# Patient Record
Sex: Male | Born: 1955 | Race: Black or African American | Hispanic: No | Marital: Married | Smoking: Current every day smoker
Health system: Southern US, Community
[De-identification: ages and names within clinical notes are randomized; demographics above are authoritative.]

## PROBLEM LIST (undated history)

## (undated) DIAGNOSIS — E7800 Pure hypercholesterolemia, unspecified: Secondary | ICD-10-CM

## (undated) DIAGNOSIS — Q825 Congenital non-neoplastic nevus: Secondary | ICD-10-CM

## (undated) DIAGNOSIS — I10 Essential (primary) hypertension: Secondary | ICD-10-CM

## (undated) DIAGNOSIS — H538 Other visual disturbances: Secondary | ICD-10-CM

## (undated) DIAGNOSIS — I739 Peripheral vascular disease, unspecified: Secondary | ICD-10-CM

## (undated) DIAGNOSIS — N189 Chronic kidney disease, unspecified: Secondary | ICD-10-CM

## (undated) DIAGNOSIS — H905 Unspecified sensorineural hearing loss: Secondary | ICD-10-CM

## (undated) DIAGNOSIS — K5900 Constipation, unspecified: Secondary | ICD-10-CM

## (undated) DIAGNOSIS — G4700 Insomnia, unspecified: Secondary | ICD-10-CM

## (undated) DIAGNOSIS — E559 Vitamin D deficiency, unspecified: Secondary | ICD-10-CM

## (undated) DIAGNOSIS — K219 Gastro-esophageal reflux disease without esophagitis: Secondary | ICD-10-CM

## (undated) HISTORY — DX: Unspecified sensorineural hearing loss: H90.5

## (undated) HISTORY — DX: Constipation, unspecified: K59.00

## (undated) HISTORY — PX: COLONOSCOPY: SHX174

## (undated) HISTORY — DX: Other visual disturbances: H53.8

## (undated) HISTORY — DX: Insomnia, unspecified: G47.00

## (undated) HISTORY — DX: Congenital non-neoplastic nevus: Q82.5

## (undated) HISTORY — DX: Vitamin D deficiency, unspecified: E55.9

## (undated) HISTORY — DX: Pure hypercholesterolemia, unspecified: E78.00

## (undated) HISTORY — DX: Peripheral vascular disease, unspecified: I73.9

## (undated) HISTORY — DX: Gastro-esophageal reflux disease without esophagitis: K21.9

---

## 1997-11-13 ENCOUNTER — Ambulatory Visit (HOSPITAL_BASED_OUTPATIENT_CLINIC_OR_DEPARTMENT_OTHER): Admission: RE | Admit: 1997-11-13 | Discharge: 1997-11-13 | Payer: Self-pay | Admitting: Otolaryngology

## 1999-08-26 ENCOUNTER — Emergency Department (HOSPITAL_COMMUNITY): Admission: EM | Admit: 1999-08-26 | Discharge: 1999-08-26 | Payer: Self-pay | Admitting: Emergency Medicine

## 1999-09-13 ENCOUNTER — Encounter: Admission: RE | Admit: 1999-09-13 | Discharge: 1999-12-12 | Payer: Self-pay | Admitting: Family Medicine

## 1999-09-15 ENCOUNTER — Encounter: Admission: RE | Admit: 1999-09-15 | Discharge: 1999-09-15 | Payer: Self-pay | Admitting: Family Medicine

## 1999-09-15 ENCOUNTER — Encounter: Payer: Self-pay | Admitting: Family Medicine

## 2000-05-07 ENCOUNTER — Emergency Department (HOSPITAL_COMMUNITY): Admission: EM | Admit: 2000-05-07 | Discharge: 2000-05-07 | Payer: Self-pay | Admitting: Emergency Medicine

## 2001-01-14 ENCOUNTER — Emergency Department (HOSPITAL_COMMUNITY): Admission: EM | Admit: 2001-01-14 | Discharge: 2001-01-14 | Payer: Self-pay | Admitting: Emergency Medicine

## 2003-01-30 ENCOUNTER — Encounter: Admission: RE | Admit: 2003-01-30 | Discharge: 2003-04-30 | Payer: Self-pay | Admitting: Family Medicine

## 2004-12-01 ENCOUNTER — Inpatient Hospital Stay (HOSPITAL_COMMUNITY): Admission: EM | Admit: 2004-12-01 | Discharge: 2004-12-06 | Payer: Self-pay | Admitting: Emergency Medicine

## 2006-01-05 ENCOUNTER — Emergency Department (HOSPITAL_COMMUNITY): Admission: EM | Admit: 2006-01-05 | Discharge: 2006-01-06 | Payer: Self-pay | Admitting: Emergency Medicine

## 2006-01-12 ENCOUNTER — Emergency Department (HOSPITAL_COMMUNITY): Admission: EM | Admit: 2006-01-12 | Discharge: 2006-01-12 | Payer: Self-pay | Admitting: Family Medicine

## 2008-01-27 ENCOUNTER — Inpatient Hospital Stay (HOSPITAL_COMMUNITY): Admission: EM | Admit: 2008-01-27 | Discharge: 2008-01-28 | Payer: Self-pay | Admitting: Emergency Medicine

## 2008-05-01 HISTORY — PX: PERIPHERAL ARTERIAL STENT GRAFT: SHX2220

## 2008-08-17 ENCOUNTER — Ambulatory Visit (HOSPITAL_COMMUNITY): Admission: RE | Admit: 2008-08-17 | Discharge: 2008-08-17 | Payer: Self-pay | Admitting: Cardiovascular Disease

## 2008-10-05 ENCOUNTER — Ambulatory Visit: Payer: Self-pay | Admitting: Surgery

## 2008-10-15 ENCOUNTER — Inpatient Hospital Stay (HOSPITAL_COMMUNITY): Admission: RE | Admit: 2008-10-15 | Discharge: 2008-10-19 | Payer: Self-pay | Admitting: Surgery

## 2008-10-15 ENCOUNTER — Ambulatory Visit: Payer: Self-pay | Admitting: Surgery

## 2008-11-09 ENCOUNTER — Ambulatory Visit: Payer: Self-pay | Admitting: Surgery

## 2009-06-15 ENCOUNTER — Encounter (INDEPENDENT_AMBULATORY_CARE_PROVIDER_SITE_OTHER): Payer: Self-pay | Admitting: Family Medicine

## 2009-06-15 ENCOUNTER — Ambulatory Visit: Payer: Self-pay | Admitting: Family Medicine

## 2009-06-15 LAB — CONVERTED CEMR LAB
ALT: 14 units/L (ref 0–53)
CO2: 23 meq/L (ref 19–32)
Calcium: 9.4 mg/dL (ref 8.4–10.5)
Chloride: 98 meq/L (ref 96–112)
Cholesterol: 262 mg/dL — ABNORMAL HIGH (ref 0–200)
Lymphs Abs: 2.6 10*3/uL (ref 0.7–4.0)
MCV: 78 fL (ref 78.0–100.0)
Monocytes Relative: 6 % (ref 3–12)
Neutro Abs: 9 10*3/uL — ABNORMAL HIGH (ref 1.7–7.7)
Neutrophils Relative %: 73 % (ref 43–77)
Potassium: 4.8 meq/L (ref 3.5–5.3)
RBC: 6.83 M/uL — ABNORMAL HIGH (ref 4.22–5.81)
Sodium: 135 meq/L (ref 135–145)
Total Protein: 6.8 g/dL (ref 6.0–8.3)
VLDL: 26 mg/dL (ref 0–40)
WBC: 12.4 10*3/uL — ABNORMAL HIGH (ref 4.0–10.5)

## 2009-06-16 ENCOUNTER — Ambulatory Visit: Payer: Self-pay | Admitting: Internal Medicine

## 2009-07-14 ENCOUNTER — Ambulatory Visit: Payer: Self-pay | Admitting: Internal Medicine

## 2009-11-11 ENCOUNTER — Ambulatory Visit: Payer: Self-pay | Admitting: Internal Medicine

## 2009-11-11 ENCOUNTER — Encounter (INDEPENDENT_AMBULATORY_CARE_PROVIDER_SITE_OTHER): Payer: Self-pay | Admitting: Family Medicine

## 2009-11-11 LAB — CONVERTED CEMR LAB
AST: 15 units/L (ref 0–37)
Albumin: 4 g/dL (ref 3.5–5.2)
Alkaline Phosphatase: 117 units/L (ref 39–117)
BUN: 17 mg/dL (ref 6–23)
HDL: 48 mg/dL (ref >39)
LDL Cholesterol: 178 mg/dL — ABNORMAL HIGH (ref 0–99)
PSA: 1.43 ng/mL (ref 0.10–4.00)
Potassium: 4.4 meq/L (ref 3.5–5.3)
Sodium: 142 meq/L (ref 135–145)
VLDL: 15 mg/dL (ref 0–40)

## 2009-11-17 ENCOUNTER — Ambulatory Visit: Payer: Self-pay | Admitting: Internal Medicine

## 2010-01-18 ENCOUNTER — Encounter (INDEPENDENT_AMBULATORY_CARE_PROVIDER_SITE_OTHER): Payer: Self-pay | Admitting: Family Medicine

## 2010-01-18 LAB — CONVERTED CEMR LAB
ALT: 14 units/L (ref 0–53)
Hgb A1c MFr Bld: 13.3 % — ABNORMAL HIGH (ref <5.7)
LDL Cholesterol: 159 mg/dL — ABNORMAL HIGH (ref 0–99)
Triglycerides: 69 mg/dL (ref <150)

## 2010-08-08 LAB — BLOOD GAS, ARTERIAL
Bicarbonate: 21.2 mEq/L (ref 20.0–24.0)
Bicarbonate: 21.9 mEq/L (ref 20.0–24.0)
Drawn by: 206361
FIO2: 0.21 %
O2 Saturation: 98.2 %
Patient temperature: 97.5
Patient temperature: 98.6
TCO2: 22.6 mmol/L (ref 0–100)
pH, Arterial: 7.384 (ref 7.350–7.450)
pO2, Arterial: 115 mmHg — ABNORMAL HIGH (ref 80.0–100.0)

## 2010-08-08 LAB — DIFFERENTIAL
Basophils Absolute: 0 10*3/uL (ref 0.0–0.1)
Basophils Relative: 0 % (ref 0–1)
Eosinophils Relative: 0 % (ref 0–5)
Monocytes Absolute: 1.3 10*3/uL — ABNORMAL HIGH (ref 0.1–1.0)

## 2010-08-08 LAB — COMPREHENSIVE METABOLIC PANEL
ALT: 15 U/L (ref 0–53)
AST: 23 U/L (ref 0–37)
Albumin: 2.9 g/dL — ABNORMAL LOW (ref 3.5–5.2)
Albumin: 3.7 g/dL (ref 3.5–5.2)
Alkaline Phosphatase: 63 U/L (ref 39–117)
Alkaline Phosphatase: 89 U/L (ref 39–117)
BUN: 9 mg/dL (ref 6–23)
CO2: 23 mEq/L (ref 19–32)
Calcium: 8.9 mg/dL (ref 8.4–10.5)
Chloride: 109 mEq/L (ref 96–112)
GFR calc Af Amer: >60 mL/min (ref >60)
GFR calc Af Amer: >60 mL/min (ref >60)
GFR calc non Af Amer: >60 mL/min (ref >60)
Potassium: 3.7 mEq/L (ref 3.5–5.1)
Potassium: 4 mEq/L (ref 3.5–5.1)
Sodium: 137 mEq/L (ref 135–145)
Total Bilirubin: 1.2 mg/dL (ref 0.3–1.2)
Total Protein: 6.6 g/dL (ref 6.0–8.3)

## 2010-08-08 LAB — GLUCOSE, CAPILLARY
Glucose-Capillary: 108 mg/dL — ABNORMAL HIGH (ref 70–99)
Glucose-Capillary: 114 mg/dL — ABNORMAL HIGH (ref 70–99)
Glucose-Capillary: 143 mg/dL — ABNORMAL HIGH (ref 70–99)
Glucose-Capillary: 145 mg/dL — ABNORMAL HIGH (ref 70–99)
Glucose-Capillary: 167 mg/dL — ABNORMAL HIGH (ref 70–99)
Glucose-Capillary: 177 mg/dL — ABNORMAL HIGH (ref 70–99)
Glucose-Capillary: 220 mg/dL — ABNORMAL HIGH (ref 70–99)
Glucose-Capillary: 277 mg/dL — ABNORMAL HIGH (ref 70–99)

## 2010-08-08 LAB — POCT I-STAT 7, (LYTES, BLD GAS, ICA,H+H)
Acid-base deficit: 2 mmol/L (ref 0.0–2.0)
Calcium, Ion: 1.15 mmol/L (ref 1.12–1.32)
Calcium, Ion: 1.15 mmol/L (ref 1.12–1.32)
HCT: 45 % (ref 39.0–52.0)
HCT: 47 % (ref 39.0–52.0)
Hemoglobin: 16 g/dL (ref 13.0–17.0)
O2 Saturation: 100 %
O2 Saturation: 100 %
Patient temperature: 36
Potassium: 3.2 mEq/L — ABNORMAL LOW (ref 3.5–5.1)
Potassium: 3.2 mEq/L — ABNORMAL LOW (ref 3.5–5.1)
Sodium: 140 mEq/L (ref 135–145)
pCO2 arterial: 37.4 mmHg (ref 35.0–45.0)
pH, Arterial: 7.41 (ref 7.350–7.450)
pO2, Arterial: 355 mmHg — ABNORMAL HIGH (ref 80.0–100.0)
pO2, Arterial: 401 mmHg — ABNORMAL HIGH (ref 80.0–100.0)

## 2010-08-08 LAB — CBC
HCT: 39.8 % (ref 39.0–52.0)
HCT: 47.1 % (ref 39.0–52.0)
HCT: 47.2 % (ref 39.0–52.0)
Hemoglobin: 12.1 g/dL — ABNORMAL LOW (ref 13.0–17.0)
Hemoglobin: 13 g/dL (ref 13.0–17.0)
Hemoglobin: 15.4 g/dL (ref 13.0–17.0)
MCHC: 32.6 g/dL (ref 30.0–36.0)
MCHC: 33.4 g/dL (ref 30.0–36.0)
MCV: 80 fL (ref 78.0–100.0)
MCV: 80.4 fL (ref 78.0–100.0)
Platelets: 176 10*3/uL (ref 150–400)
Platelets: 218 10*3/uL (ref 150–400)
RBC: 4.5 MIL/uL (ref 4.22–5.81)
RBC: 5.87 MIL/uL — ABNORMAL HIGH (ref 4.22–5.81)
RDW: 12.7 % (ref 11.5–15.5)
RDW: 13 % (ref 11.5–15.5)
RDW: 13.3 % (ref 11.5–15.5)
WBC: 12.4 10*3/uL — ABNORMAL HIGH (ref 4.0–10.5)
WBC: 16.4 10*3/uL — ABNORMAL HIGH (ref 4.0–10.5)
WBC: 24 10*3/uL — ABNORMAL HIGH (ref 4.0–10.5)

## 2010-08-08 LAB — POCT I-STAT 3, ART BLOOD GAS (G3+)
Acid-base deficit: 4 mmol/L — ABNORMAL HIGH (ref 0.0–2.0)
Bicarbonate: 21.2 mEq/L (ref 20.0–24.0)
O2 Saturation: 98 %
Patient temperature: 37.2
TCO2: 22 mmol/L (ref 0–100)

## 2010-08-08 LAB — BASIC METABOLIC PANEL
CO2: 24 mEq/L (ref 19–32)
Calcium: 7.6 mg/dL — ABNORMAL LOW (ref 8.4–10.5)
Calcium: 8 mg/dL — ABNORMAL LOW (ref 8.4–10.5)
Chloride: 109 mEq/L (ref 96–112)
GFR calc Af Amer: >60 mL/min (ref >60)
GFR calc non Af Amer: >60 mL/min (ref >60)
GFR calc non Af Amer: >60 mL/min (ref >60)
Glucose, Bld: 141 mg/dL — ABNORMAL HIGH (ref 70–99)
Glucose, Bld: 150 mg/dL — ABNORMAL HIGH (ref 70–99)
Glucose, Bld: 285 mg/dL — ABNORMAL HIGH (ref 70–99)
Potassium: 3.8 mEq/L (ref 3.5–5.1)
Potassium: 3.9 mEq/L (ref 3.5–5.1)
Sodium: 132 mEq/L — ABNORMAL LOW (ref 135–145)
Sodium: 134 mEq/L — ABNORMAL LOW (ref 135–145)
Sodium: 138 mEq/L (ref 135–145)

## 2010-08-08 LAB — TYPE AND SCREEN
ABO/RH(D): A POS
Antibody Screen: NEGATIVE

## 2010-08-08 LAB — POCT I-STAT 4, (NA,K, GLUC, HGB,HCT)
Glucose, Bld: 86 mg/dL (ref 70–99)
HCT: 44 % (ref 39.0–52.0)
Hemoglobin: 15 g/dL (ref 13.0–17.0)
Potassium: 3 mEq/L — ABNORMAL LOW (ref 3.5–5.1)
Sodium: 142 mEq/L (ref 135–145)

## 2010-08-08 LAB — PROTIME-INR: INR: 0.9 (ref 0.00–1.49)

## 2010-08-08 LAB — URINALYSIS, ROUTINE W REFLEX MICROSCOPIC
Glucose, UA: NEGATIVE mg/dL
Ketones, ur: NEGATIVE mg/dL
Protein, ur: NEGATIVE mg/dL

## 2010-08-08 LAB — MAGNESIUM: Magnesium: 1.4 mg/dL — ABNORMAL LOW (ref 1.5–2.5)

## 2010-08-08 LAB — AMYLASE: Amylase: 47 U/L (ref 27–131)

## 2010-08-08 LAB — URINE MICROSCOPIC-ADD ON

## 2010-08-08 LAB — APTT: aPTT: 26 seconds (ref 24–37)

## 2010-09-13 NOTE — Op Note (Signed)
Gary [name], Gary [name]                ACCOUNT NO.:  [id number]   MEDICAL RECORD NO.:  [id number]          PATIENT TYPE:  INP   LOCATION:  2308                         FACILITY:  [facility]   PHYSICIAN:  Theotis Burrow IV, MDDATE OF BIRTH:  October 07, 1955   DATE OF PROCEDURE:  10/15/2008  DATE OF DISCHARGE:                               OPERATIVE REPORT   PREOPERATIVE DIAGNOSIS:  Severe bilateral claudication.   POSTOPERATIVE DIAGNOSIS:  Severe bilateral claudication.   PROCEDURE PERFORMED:  Aortobifemoral bypass graft using 14 x 7  bifurcated Dacron graft.   SURGEON:  1. Leia Alf, MD   ASSISTANT:  Ernesto Rutherford, PA   ANESTHESIA:  General.   BLOOD LOSS:  250 cc.   DRAINS:  None.   COMPLICATIONS:  None.   INDICATIONS:  Gary [name] is a 55 year old gentleman who has had  claudication over the past 2 years, which has gotten worse recently to  where he has very minimal ability to ambulate.  He has been seen and  cleared for surgery by Dr. Quay Burow, his cardiologist.  A CT scan  reveals distal aortic occlusion, comes in today for an aortobifemoral  bypass graft.   PROCEDURE IN DETAIL:  The patient was identified in the holding area,  taken to room and he was placed supine on the table.  General  endotracheal anesthesia was administered.  The patient was prepped and  draped in standard sterile fashion.  A time-out was called.  Antibiotics  were given.  The inguinal ligament was identified by bony landmarks as  there was no palpable pulse.  A longitudinal incision was made below the  inguinal ligament.  Cautery was used to divide the subcutaneous tissue.  The femoral sheath was identified and opened sharply bilaterally.  The  left femoral artery was identified.  There were numerous collateral  branches coming from both femoral, majority of which were preserved.  On  the left side, there was a dominant anterior branch with multiple  secondary branch, which was preserved.   Once the femoral arteries were  identified bilaterally, they were mobilized down to the bifurcation of  the profunda and superficial femoral artery.  There was a large  posterior branch on the right.  Once adequate exposure of the femoral  artery was obtained, I mobilized it up under the inguinal ligament,  large crossing vein was divided on both sides with 2-0 silk tie.  At  this point, wet Ray-Tecs were placed in the groin and attention was  turned towards the abdomen.  Midline incision was made with #10 blade.  The subcutaneous tissue was divided with cautery down to the abdominal  wall fascia.  The fascia was opened in the midline and the abdominal  cavity was entered.  Gross inspection of the abdominal cavity in its  major organs was without abnormality.  The transverse colon was then  lifted cephalad with the omentum.  Ligament of Treitz was taken down  sharply.  Small bowel was mobilized to the patient's right side of the  body.  Omni-Tract retractor was used to aid with exposure.  The  retroperitoneum was entered sharply.  The inferior mesenteric was  identified and divided between 2-0 silk ties.  The patient had a large  approximately 4-mm inferior mesenteric artery and mobilized the aorta up  to the renal vein.  It was soft in this area, as well as without heavy  calcification down to the inferior mesenteric artery.  I then identified  each common iliac artery and created a tunnel into each groin.  The  tunnel was made bluntly directly anterior to the iliac arteries making  sure I was posterior to the ureter.  An umbilical tape was passed.  At  this point in time, the patient was given systemic heparinization.  After the heparin circulated, the aorta was occluded with two Zanger  clamps below the renal arteries and above the inferior mesenteric  artery.  Due to the size of the inferior mesenteric artery, I elected to  perform an end-to-side anastomosis.  An aortotomy was made  with #11  blade, which was extended with Potts scissors.  There is minimal  atheroma at the level of the anastomosis.  A 14 x 7 graft was left.  The  end was beveled to fit the size of the arteriotomy.  A running  anastomosis was created with a 6-0 Prolene.  This was an end-to-side  anastomosis.  Once this was completed, the aorta was flushed in  antegrade and retrograde fashion.  The anastomosis was hemostatic.  The  retractors were then relaxed and the limbs of the graft were brought  through the previously created tunnels.  At this point, the right  femoral artery anastomosis was performed first.  This anastomosis was to  be common femoral artery.  There was a posterior profunda branch in the  midportion of the anastomosis and the distal part was in the distal  common femoral artery.  An 11-blade was used to make an arteriotomy,  which was extended with Potts scissors.  The graft was pulled and  transected and then beveled.  An end-to-side anastomosis was created  with a running 5-0.  Prior to completion of the anastomosis, the femoral  artery was flushed in antegrade and retrograde fashion.  The right limb  of the graft was also flushed and the anastomosis was then secured.  The  right leg was then restored.  Next, the left groin anastomosis was  performed.  The femoral artery was occluded proximal to the vascular  clamps.  A #11 blade was used to make an arteriotomy that was extended  with Potts scissors.  This was in the distal common femoral artery.  The  graft was pulled to length and transected.  The end beveled to fit the  size of the arteriotomy and a running anastomosis was created in an end-  to-side fashion using a 5-0 Prolene.  Prior to completion of the  anastomosis, the femoral artery was flushed in antegrade and retrograde  fashion.  The graft was then flushed.  The anastomosis was then secured.  Clamps were released first on the common femoral artery and then on the   graft to flush any debris into the pelvis and then the left leg blood  flow to the left leg was restored.  Handheld Doppler was used to  evaluate signals in the profunda and superficial femoral artery.  Both  had multiphasic signals, which dampened with graft occlusion.  At this  point in time, I was satisfied with the repair.  The patient was given  50  mg of protamine.  Attention was turned again to the abdomen.  The  proximal anastamosis was hemostatic.  The retroperitoneum was irrigated  with saline as was the abdomen.  The retroperitoneum was reapproximated  with 2-0 vicryl.  Teh abdominal contents were place back into their  anatomical position.  The bowel was inspected and found to be healthy.  The fascia was re-approximated with #1 PDS.  The subcutaneous tissue was  closed with 3-0 vicryl, and the skin was cloed with 4-0 vicryl.  The  groins were then irrigated.  They were hemostatic.  The were closed in  multiple layers of 2-0 and 3-0 cicryl.  The skin was closed with 4-0  vicryl.  Sterile dressings were applied.  The patient was successfully  extubated and taken to the PACU in stable condition.    DICTATION ENDED AT THIS POINT.      Eldridge Abrahams, MD  Electronically Signed     VWB/MEDQ  D:  10/15/2008  T:  10/16/2008  Job:  [id number]

## 2010-09-13 NOTE — Discharge Summary (Signed)
Gary [name], Gary [name]                ACCOUNT NO.:  [id number]   MEDICAL RECORD NO.:  [id number]          PATIENT TYPE:  INP   LOCATION:  2029                         FACILITY:  [facility]   PHYSICIAN:  Theotis Burrow IV, MDDATE OF BIRTH:  [birth year]   DATE OF ADMISSION:  10/15/2008  DATE OF DISCHARGE:  10/19/2008                               DISCHARGE SUMMARY   FINAL DISCHARGE DIAGNOSES:  1. Peripheral vascular disease with a recent severe bilateral      claudication of his lower extremities.  2. Dyslipidemia.  3. Hypertension.  4. Diabetes mellitus.  5. Tobacco abuse.   PROCEDURES PERFORMED:  Aortobifemoral bypass graft using 14 x 7  bifurcated Dacron graft by Dr. Trula Slade, October 15, 2008.   COMPLICATIONS:  None.   CONDITION ON DISCHARGE:  Stable, improving.   DISCHARGE MEDICATIONS:  He is instructed to resume all previous  medications consisting of,  1. Actos 45 mg p.o. daily.  2. Metformin 1000 mg p.o. b.i.d.  3. Simvastatin 40 mg p.o. nightly.  4. Glimepiride 4 mg p.o. daily.  5. Ramipril 2.5 mg p.o. daily.  6. Lantus insulin 20 units p.r.n. subcu.  7. Aspirin 81 mg p.o. daily.   He is given a prescription for Percocet 5/325 one p.o. q.4 h. p.r.n.  pain, total #40 was given.   DISPOSITION:  He is given careful instructions regarding the care of his  wounds and his activity level.  He is cautioned on lifting heavy objects  for the next 6 weeks.  He is informed that he should not drive for the  next 2 weeks.  He is given a return appointment with Dr. Trula Slade in 2  weeks with ABIs.   BRIEF IDENTIFYING STATEMENT:  For complete details, please refer the  typed history and physical.  Briefly, this very pleasant 54 year old  gentleman was evaluated by Dr. Trula Slade for bilateral claudication.  He  was found to have significant iliac disease.  Dr. Trula Slade recommended  aortobifemoral bypass grafting.  He was informed of the risks and  benefits of the procedure and after careful  consideration, he elected to  proceed with the surgery.   HOSPITAL COURSE:  Preoperative workup was completed as an outpatient.  He was brought in through same-day surgery on October 15, 2008 and  underwent the aforementioned revascularization procedure.  For complete  details, please refer the typed operative report.  The procedure was  without complications.  He was returned to the intensive care unit in  critical, but hemodynamically stable condition.  He was extubated.  He  was able to be transferred to a  bed on the surgical convalescent floor on the second postoperative day.  Following his transfer, his diet and activity level were increased as  tolerated.  On October 19, 2008, he was evaluated.  He was found to be in  stable condition.  He was desirous of discharge and was discharged home.      Chad Cordial, PA      V. Leia Alf, MD  Electronically Signed    KEL/MEDQ  D:  10/19/2008  T:  10/19/2008  Job:  [id number]   cc:   Eldridge Abrahams, MD

## 2010-09-13 NOTE — Assessment & Plan Note (Signed)
OFFICE VISIT   Gary [name], Gary [name]  DOB:  Aug 23, 1955                                       11/09/2008  [id number]   REASON FOR VISIT:  Postoperative follow-up status post aortobifemoral  bypass graft.   HISTORY:  This is a 55 year old gentleman that I saw at the request of  Dr. Quay Burow for evaluation of bilateral claudication.  The  patient was having difficulty ambulating more than 20-30 feet secondary  to cramping in both legs.  His right leg was more severe than the left.  He had been having symptoms for 2 years which had gotten progressively  worse.  He had a CT scan which revealed an infraaortic occlusion.  Ankle  brachial indices were 0.56 bilaterally.  The patient underwent  aortobifemoral bypass graft using a 14 x 7 bifurcated Dacron graft.  This was performed on October 15, 2008.  The patient's postoperative course  was uncomplicated.  He was discharged home on postoperative day #4. He  comes back in today for follow-up.  He is doing very well at this time.  He is not having any issues with claudication.  He says he walks half a  mile to a mile and does not have any difficulties.  He is very happy  with the improvement in his lifestyle.   On examination his midline incision is well healed.  Both groin  incisions are well-healed.  He has palpable pedal pulses.   ASSESSMENT:  Status post aortobifemoral bypass graft, doing very well.   PLAN:  The patient will need to undergo routine bypass graft  surveillance and this will be performed by Dr. Quay Burow to have  his vascular disease followed.  He will need to undergo serial  surveillance ultrasounds.  If there are any complications,  I would like  to be notified.  Again, Dr. Gwenlyn Found will coordinate his postoperative  vascular care.   Gary Abrahams, MD  Electronically Signed   VWB/MEDQ  D:  11/09/2008  T:  11/10/2008  Job:  [id number]   cc:   Quay Burow, M.D.  Nolene Ebbs,  M.D.

## 2010-09-13 NOTE — Procedures (Signed)
CAROTID DUPLEX EXAM   INDICATION:  Preop evaluation.   HISTORY:  Diabetes:  Yes.  Cardiac:  No.  Hypertension:  Yes.  Smoking:  Yes.  Previous Surgery:  No carotid surgeries.  CV History:  Asymptomatic.  Amaurosis Fugax No, Paresthesias No, Hemiparesis No.                                       RIGHT             LEFT  Brachial systolic pressure:         130               122  Brachial Doppler waveforms:         Normal            Normal  Vertebral direction of flow:        Antegrade         Antegrade  DUPLEX VELOCITIES (cm/sec)  CCA peak systolic                   105               [id number]  ECA peak systolic                   82                66  ICA peak systolic                   67                88  ICA end diastolic                   27                36  PLAQUE MORPHOLOGY:                                    Heterogenous  PLAQUE AMOUNT:                      None              Mild  PLAQUE LOCATION:                                      ICA   IMPRESSION:  1. No evidence of stenosis noted in the right internal carotid artery.  2. 1 to 39% stenosis of the left internal carotid artery.    ___________________________________________  V. Leia Alf, MD   CH/MEDQ  D:  10/05/2008  T:  10/05/2008  Job:  [id number]

## 2010-09-13 NOTE — Assessment & Plan Note (Signed)
OFFICE VISIT   [name], [name]  DOB:  1956/02/11                                       10/05/2008  [id number]   REASON FOR VISIT:  Bilateral claudication.   REFERRING PHYSICIAN:  Quay Burow, M.D.   HISTORY:  This is a 55 year old gentleman who I am seeing at the request  of Dr. Quay Burow for evaluation of bilateral claudication.  The  patient states that he can only ambulate approximately 20-30 feet at  which time he gets cramping in both legs, the right leg is more severe  than the left.  He states that this has been present for approximately 2  years and has been getting progressively worse over time.  He denies  symptoms of rest pain.  He denies having any nonhealing wounds or  ulcers.  His symptoms are improved with rest and are aggravated with  ambulation.  He also endorses symptoms of impotence.   The patient endorses tobacco abuse for approximately 40 pack years.  He  does have insulin dependent diabetes, hypertension and  hypercholesterolemia.  He has never had a heart attack.   REVIEW OF SYSTEMS:  GENERAL:  Negative for fevers, chills, weight gain,  weight loss.  CARDIAC:  Negative.  PULMONARY:  Negative.  GI:  Negative.  GU:  Negative.  VASCULAR:  Positive for pain in legs with walking and when lying flat.  NEURO:  Negative.  ORTHO:  Negative.  PSYCHIATRIC:  Negative.  ENT:  Negative.  HEME:  Negative.   PAST MEDICAL HISTORY:  Diabetes, hypertension, hypercholesterolemia,  peripheral vascular disease.   FAMILY HISTORY:  Is significant for diabetes in his mother.   SOCIAL HISTORY:  He is married.  Works as a Administrator.  Currently  smokes one pack a day.  Does not drink alcohol.   MEDICATIONS:  Include metformin, simvastatin, ramipril, Actos,  glimepiride, aspirin.   ALLERGIES:  None.   PHYSICAL EXAMINATION:  Vital signs:  Blood pressure 130/80, pulse is 88.  General:  He is well-appearing, in no distress.   HEENT:  Normocephalic,  atraumatic.  Pupils equal.  Sclerae anicteric.  Neck:  Supple.  No JVD.  No carotid bruits.  Cardiovascular:  Regular rate and rhythm.  No  murmurs.  Pulmonary:  Lungs clear bilaterally.  Abdomen:  Soft,  nontender.  No hepatosplenomegaly.  Extremities:  Are warm and well-  perfused.  He has faint femoral pulses.  No palpable pedal pulses.  Neurological:  Cranial nerves II-XII are grossly intact.  Skin:  Is  without rash.  Psychiatric:  He is alert and oriented x3.   DIAGNOSTIC STUDIES:  The patient comes with records from [place].  Cardiac echo shows normal ejection fraction.  Myoview shows normal  perfusion.  Ankle brachial indices are 0.56 on the right and 0.56 on the  left.  CT angiogram:  The patient has infrarenal aortic occlusion with  reconstitution of bilateral external iliac arteries.   ASSESSMENT:  Bilateral claudication.   PLAN:  I discussed with the patient and his wife their options and  indications for surgical intervention.  He is eager to get this taken  care as he is very uncomfortable with this degree of symptoms.  We have  decided to proceed with an aortobi-iliac versus an aortobifemoral bypass  graft.  We discussed the possible risks which include  but are not  limited to risk of heart attack, risk of pneumonia, risk of graft  infection, risk of early graft loss.  The patient understands this and  is eager to proceed.  I will attempt a bi-iliac bypass and avoid a groin  incision.  His operation has been scheduled for 10/15/2008 which is a  Thursday.  I am going to get a baseline carotid duplex today as he has  not had this done.   Eldridge Abrahams, MD  Electronically Signed   VWB/MEDQ  D:  10/05/2008  T:  10/07/2008  Job:  [id number]   cc:   Quay Burow, M.D.  Nolene Ebbs, M.D.

## 2010-09-16 NOTE — Discharge Summary (Signed)
NAMECLEBURN, GREIWE                ACCOUNT NO.:  [id number]   MEDICAL RECORD NO.:  [id number]          PATIENT TYPE:  INP   LOCATION:  5727                         FACILITY:  [facility]   PHYSICIAN:  Corinna L. Conley Canal, MDDATE OF BIRTH:  21-Jul-1955   DATE OF ADMISSION:  11/30/2004  DATE OF DISCHARGE:  12/06/2004                                 DISCHARGE SUMMARY   DIAGNOSES:  1.  Right preseptal cellulitis with right facial cellulitis.  2.  Uncontrolled diabetes.  3.  Multiple apical abscesses.  4.  Hyperlipidemia.  5.  Tobacco abuse, counseled against.  6.  Medical noncompliance.  7.  Tinea pedis.   DISCHARGE MEDICATIONS:  1.  Augmentin 875 milligrams p.o. q.d. for nine more days.  2.  Metformin 1000 milligrams p.o. b.i.d.  3.  Amaryl 8 milligrams p.o. q.d.  4.  Econazole Cream to the feet and toes twice daily.  5.  Zocor 20 milligrams p.o. q.h.s.  6.  Amaryl 8 milligrams p.o. q.d.  7.  Actos 45 milligrams p.o. q.d.  8.  Lantus 30 units subcutaneously q.h.s.  9.  Tylenol as needed.   DISCHARGE INSTRUCTIONS:  He is to apply warm compresses to the right eye  until the swelling has resolved. Diet should be diabetic low salt. He may  return to work in one week. Follow up with [facility] in one week. Follow  up with dental clinic and he is to monitor his blood glucoses at least twice  a day. He is encouraged to quit smoking and attend outpatient diabetes  education classes.   CONDITION:  Stable.   CONSULTATIONS:  Dr. Deloria Lair.   PROCEDURES:  None.   PERTINENT LABORATORY DATA:  Blood cultures negative. UA showed 40 ketones,  greater than 1000 glucose, specific gravity 1.034, negative nitrites,  negative leukocyte esterase, negative protein. LDL was 141, HDL is 40,  triglycerides 119, total cholesterol 205, hemoglobin A1c is 17.4. CMET on  admission was significant for a glucose of 477 otherwise unremarkable. CBC  on admission was significant for white blood cell count of  16,000 otherwise  unremarkable. At discharge, his white blood cell count was 10,000. Special  studies in radiology:  Maxillofacial CT showed a right orbital cellulitis  extending into the right neck, no intraconal extension, numerous periapical  abscesses in the maxilla and mandible and severe dental caries.   HISTORY AND HOSPITAL COURSE:  Mr. Cottman is a 55 year old unassigned black  male with a history of diabetes who has not seen his primary care physician  nor taken any medications for several years. He has been on insulin in the  past. He presented to the emergency room with right eye swelling. He also  noted polyuria, polydipsia. He had some purulent drainage from his right eye  and pain. He had a temperature of 100.7 in the emergency room. He admits to  smoking. He is truckdriver. His pulse was 105, his temperature was 100.7,  otherwise vital signs were within normal limits. He had severe swelling of  the right eye. It was closed, red and very tender. There were full  extraocular movements and visual acuity was intact. Tympanic membranes were  clear. Nares patent. The pharynx was without erythema or exudate. He was  found to have severe hyperglycemia and was admitted to the hospitalist  service and started on Unasyn and vancomycin. I was concerned about  underlying abscess initially but CAT scan did not show this. It did show,  however, periapical abscesses and the patient reports that he has had tooth  pain for quite some time.  I consulted Dr. Zadie Rhine who felt that this was all  preseptal cellulitis and possibly dacryocystitis. Warm compresses were  applied to his eye. His infection was quite slow to improve but at the time  of discharge had improved tremendously. He still had a bit of swelling but  drainage, tenderness, redness, fevers, leukocytosis were all improved. 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 I have  started him on maximal doses of Amaryl,  metformin, Actos; however, he has still had quite elevated blood glucoses  despite oral hypoglycemics and he has been getting Lantus his every night. I  have recommended to him at least temporarily being on Lantus until his  infection clears. It is uncertain whether he will be compliant with this,  however, I also recommended that he monitor his blood glucoses but he  reports that strips are too expensive. He has been arranged for indigent  meds here in the hospital and I have referred him via social work to  [facility] and at an [facility]. He will need several  extractions once his facial cellulitis is fully cleared. He received smoking  cessation counseling while here. He also was found to be hyperlipidemic and  was started on Zocor. A total time on the day of discharge was 40 minutes.      Corinna L. Conley Canal, MD  Electronically Signed     CLS/MEDQ  D:  12/06/2004  T:  12/06/2004  Job:  [id number]   cc:   Dominica Severin A. Rankin, M.D.  [place]  [place]  [place] [id number]  Fax: [phone]

## 2010-09-16 NOTE — H&P (Signed)
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 He lives in  [place]. Mr. Stagnaro estimates that he has had diabetes for least four  years. The last time he can recall taking any medication for the diabetes  was two years ago. He formerly used some type of oral hypoglycemic agent,  although he does not recall what this was. In recent weeks, he has noted  increased thirst, urinary frequency and nocturia. Two days ago, he developed  some itching around his right eye associated with increased drainage.  Subsequently, he has developed increasing pain around the right eye with  associated swelling and redness. Eventually, his wife talked him into coming  to the [facility] Emergency Room where on arrival his  temperature was noted to be 100.7. A CT scan of the orbit and maxillofacial  area was done and this does not show any evidence of orbital cellulitis. The  patient was given morphine and was started on Unasyn. Blood cultures have  been obtained. On presentation, he was noted to have a blood sugar of 477.  He is therefore admitted both for treatment of periorbital cellulitis and  also for management of his uncontrolled diabetes.   MEDICATIONS:  None.   ALLERGIES:  None.   MEDICAL ILLNESSES:  The patient has a history of diabetes. He thinks that he  was taking Zocor at some point which suggests that he may have a history of  hyperlipidemia as well. He says he does not have a history of hypertension.  He states that he has had no  operations in the past.   FAMILY HISTORY:  Noteworthy for the patient's father who has gout. His  mother died of complications of diabetes in 27. He has five siblings but  does not know anything about them or whether they have any problems.   SOCIAL HISTORY:  He states that he does not drink alcohol. He smokes about  one pack of cigarettes per day. He states that he does not abuse drugs. He  has no children. He has been married for approximately eight years.   REVIEW OF SYSTEMS:  HEAD:  He denies headache or dizziness. EYES:  See  above. The patient's visual acuity appears to be normal. EAR, NOSE AND  THROAT:  Denies earache, sinus pain or sore throat. CHEST:  Denies coughing,  wheezing or chest congestion. CARDIOVASCULAR:  Denies orthopnea, PND or  ankle edema. GI:  Denies nausea, vomiting or abdominal pain. GU:  See above.  NEUROLOGIC:  There is no history of seizure or stroke. ENDOCRINE:  See  above.  PHYSICAL EXAMINATION:  VITAL SIGNS:  His temperature was 100.7, blood  pressure 142/75, pulse was initially 105, respirations 20, O2 saturation  97%.  HEENT:  Exam is remarkable for examination of the right eye. There is marked  swelling around the eye. This area is reddened and tender. There is full  extraocular movements and visual acuity in the right eye appears to be  normal. Tympanic membranes were clear. Nares is patent. Pharynx is without  erythema or exudate. There is no meningismus.  CHEST:  Chest is clear.  BACK:  Examination the back reveals no CVA or point tenderness.  CARDIOVASCULAR:  Reveals normal S1-S2 without rubs, murmurs or gallops.  ABDOMEN:  The abdomen is benign. There are normal bowel sounds without  masses or tenderness. There is no guarding or rebound.  NEUROLOGIC:  Testing is within normal limits.  EXTREMITIES:  Within normal limits. There is an area that looks like the  patient had a small boil in the right axillary region which has resolved.    IMPRESSION:  1.  Uncontrolled diabetes.  2.  Facial cellulitis with periorbital cellulitis.   PLAN:  The patient will be admitted for intravenous fluid and intravenous  antibiotics. We will use Unasyn and vancomycin to cover the possibility of a  methicillin-resistant staphylococcus. We will place the patient on sliding  scale insulin and supplement on Lantus insulin. 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 It should be possible to regulate his diabetes with oral agents. We  will follow the status of the patient's eye closely and also try to provide  him with advice about someone who can follow up on his diabetes. He states  that he will have insurance through his employer by the end of the month.       SY/MEDQ  D:  12/01/2004  T:  12/01/2004  Job:  [id number]

## 2010-09-16 NOTE — Consult Note (Signed)
NAMESOURISH, Gary [name]                ACCOUNT NO.:  [id number]   MEDICAL RECORD NO.:  [id number]          PATIENT TYPE:  INP   LOCATION:  5727                         FACILITY:  [facility]   PHYSICIAN:  Clent Demark. Rankin, M.D.   DATE OF BIRTH:  [birth year]   DATE OF CONSULTATION:  12/01/2004  DATE OF DISCHARGE:                                   CONSULTATION   REASON FOR CONSULTATION:  Facial cellulitis, possible orbital cellulitis on  the right.   HISTORY OF PRESENT ILLNESS:  The patient is a 55 year old African-American  male with a known history of diabetes mellitus who was found to have right  facial cellulitis and elevated blood sugars as well. The patient was started  on intravenous Vancomycin. He has had a recent tooth abscess as well. Past  ocular history is otherwise noncontributory. Does not wear glasses.   PHYSICAL EXAMINATION:  HEENT:  The patient is found to have induration of  the right facial malleolar soft tissues with point tenderness more  pronounced over the right lacrimal sac inferiorly. There is a mucoid and  purulent discharge of mild extent in the conjunctival sac. There is right  facial cellulitis extending down over the maxillary region. There appears to  be significant tenderness to palpation of the right maxilla as well. The  ocular examination done to look for normal vision, equal in each eye at  20/50. He denies diplopia. Motility of the globe in each normal. The pupils  are entirely normal. The anterior segment of each eye is otherwise  completely normal including conjunctivae, cornea, anterior chamber, iris,  and the pupils are normal. The patient __________. There are no signs of  orbital cellulitis.   IMPRESSION:  1.  Right facial soft tissue cellulitis which appears to be all pre-septal      and not in the orbit.  2.  Possible dacryocystitis on the right with discharge into the      conjunctival sac.  3.  No report on the chart from the CT scan  about  possible maxillary      sinusitis with a history of tooth abscess.   RECOMMENDATIONS:  1.  Antibiotics __________.  2.  Consider coverage for Hemophilus influenza.  3.  May need an oculoplastic examination or an ENT consultation for the soft      tissue issues and dacrycystitis.  4.  I have added TobraDex 1 drop twice daily topically to the right eye.      Clent Demark Rankin, M.D.  Electronically Signed     GAR/MEDQ  D:  12/01/2004  T:  12/02/2004  Job:  [id number]

## 2011-01-30 LAB — HEMOGLOBIN A1C: Mean Plasma Glucose: 358

## 2011-01-30 LAB — DIFFERENTIAL
Basophils Relative: 0
Basophils Relative: 0
Eosinophils Absolute: 0
Eosinophils Absolute: 0.2
Eosinophils Relative: 0
Eosinophils Relative: 3
Lymphocytes Relative: 11 — ABNORMAL LOW
Lymphocytes Relative: 7 — ABNORMAL LOW
Lymphocytes Relative: 8 — ABNORMAL LOW
Lymphs Abs: 1.4
Lymphs Abs: 1.4
Monocytes Absolute: 0.7
Monocytes Absolute: 1.3 — ABNORMAL HIGH
Neutro Abs: 15.3 — ABNORMAL HIGH
Neutrophils Relative %: 77
Neutrophils Relative %: 87 — ABNORMAL HIGH

## 2011-01-30 LAB — GLUCOSE, CAPILLARY
Glucose-Capillary: 213 — ABNORMAL HIGH
Glucose-Capillary: 230 — ABNORMAL HIGH
Glucose-Capillary: 234 — ABNORMAL HIGH
Glucose-Capillary: 268 — ABNORMAL HIGH
Glucose-Capillary: 321 — ABNORMAL HIGH

## 2011-01-30 LAB — BASIC METABOLIC PANEL
BUN: 8
Calcium: 9
Chloride: 100
Creatinine, Ser: 0.97
GFR calc Af Amer: >60

## 2011-01-30 LAB — CBC
HCT: 41.7
MCV: 79.9
Platelets: 241
Platelets: 253
Platelets: 265
RBC: 5.17
RBC: 5.65
RBC: 5.78
WBC: 13 — ABNORMAL HIGH
WBC: 17.6 — ABNORMAL HIGH
WBC: 21.7 — ABNORMAL HIGH

## 2011-01-30 LAB — COMPREHENSIVE METABOLIC PANEL
ALT: 14
AST: 13
Albumin: 2.5 — ABNORMAL LOW
Alkaline Phosphatase: 130 — ABNORMAL HIGH
CO2: 22
Chloride: 101
GFR calc Af Amer: >60
Potassium: 3.7
Total Bilirubin: 1.8 — ABNORMAL HIGH

## 2011-01-30 LAB — WOUND CULTURE

## 2011-01-30 LAB — CULTURE, BLOOD (ROUTINE X 2)
Culture: NO GROWTH
Culture: NO GROWTH

## 2011-07-17 HISTORY — PX: AMPUTATION: SHX166

## 2011-07-27 ENCOUNTER — Emergency Department (HOSPITAL_COMMUNITY): Payer: Self-pay

## 2011-07-27 ENCOUNTER — Emergency Department (HOSPITAL_COMMUNITY)
Admission: EM | Admit: 2011-07-27 | Discharge: 2011-07-28 | Disposition: A | Discharge status: Home / Self Care | Payer: Self-pay | Attending: Emergency Medicine | Admitting: Emergency Medicine

## 2011-07-27 ENCOUNTER — Encounter (HOSPITAL_COMMUNITY): Payer: Self-pay | Admitting: Emergency Medicine

## 2011-07-27 DIAGNOSIS — M79609 Pain in unspecified limb: Secondary | ICD-10-CM | POA: Insufficient documentation

## 2011-07-27 DIAGNOSIS — E119 Type 2 diabetes mellitus without complications: Secondary | ICD-10-CM | POA: Insufficient documentation

## 2011-07-27 DIAGNOSIS — W19XXXA Unspecified fall, initial encounter: Secondary | ICD-10-CM

## 2011-07-27 DIAGNOSIS — W010XXA Fall on same level from slipping, tripping and stumbling without subsequent striking against object, initial encounter: Secondary | ICD-10-CM | POA: Insufficient documentation

## 2011-07-27 DIAGNOSIS — T148XXA Other injury of unspecified body region, initial encounter: Secondary | ICD-10-CM

## 2011-07-27 DIAGNOSIS — I10 Essential (primary) hypertension: Secondary | ICD-10-CM | POA: Insufficient documentation

## 2011-07-27 DIAGNOSIS — M25569 Pain in unspecified knee: Secondary | ICD-10-CM | POA: Insufficient documentation

## 2011-07-27 DIAGNOSIS — Y92009 Unspecified place in unspecified non-institutional (private) residence as the place of occurrence of the external cause: Secondary | ICD-10-CM | POA: Insufficient documentation

## 2011-07-27 DIAGNOSIS — S88119A Complete traumatic amputation at level between knee and ankle, unspecified lower leg, initial encounter: Secondary | ICD-10-CM | POA: Insufficient documentation

## 2011-07-27 DIAGNOSIS — T1490XA Injury, unspecified, initial encounter: Secondary | ICD-10-CM | POA: Insufficient documentation

## 2011-07-27 DIAGNOSIS — Z794 Long term (current) use of insulin: Secondary | ICD-10-CM | POA: Insufficient documentation

## 2011-07-27 HISTORY — DX: Essential (primary) hypertension: I10

## 2011-07-27 MED ORDER — OXYCODONE-ACETAMINOPHEN 5-325 MG PO TABS
2.0000 | ORAL_TABLET | Freq: Once | ORAL | Status: AC
  Administered 2011-07-28: 2 via ORAL
  Filled 2011-07-27: qty 2

## 2011-07-27 NOTE — ED Notes (Signed)
PT. LOST BALANCED AND FELL AT HOME THIS EVENING , NO LOC, REPORTS PAIN AT RIGHT LEG ( RIGHT BKA ) .

## 2011-07-27 NOTE — ED Provider Notes (Signed)
History     CSN: [id number]  Arrival date & time 07/27/11  1910   First MD Initiated Contact with Patient 07/27/11 2250      Chief Complaint  Patient presents with  . Fall     HPI  History provided by the patient and spouse. 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 Patient states he just returned home from [place] after being hospitalized and receiving amputation to his right lower leg. 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 Today patient was returning home and was on the porch when he missed stepped causing him to fall landing directly on the end of his stump. Patient complains of pain to the stump and knee. Pain is made worse with movements. Patient has not taken anything for his symptoms. Patient denies any other aggravating or alleviating factors. Patient denies any head trauma or LOC. Patient denies any associated numbness or weakness in    Past Medical History  Diagnosis Date  . Hypertension   . Diabetes mellitus     Past Surgical History  Procedure Date  . Amputation     No family history on file.  History  Substance Use Topics  . Smoking status: Current Everyday Smoker  . Smokeless tobacco: Not on file  . Alcohol Use: No      Review of Systems  HENT: Negative for neck pain.   Musculoskeletal: Positive for joint swelling. Negative for back pain.  Neurological: Negative for weakness, numbness and headaches.    Allergies  Review of patient's allergies indicates no known allergies.  Home Medications   Current Outpatient Rx  Name Route Sig Dispense Refill  . ASPIRIN EC 81 MG PO TBEC Oral Take 81 mg by mouth daily.    . INSULIN GLARGINE 100 UNIT/ML [place] SOLN Subcutaneous Inject 20 Units into the skin at bedtime.    Marland Kitchen LISINOPRIL 5 MG  PO TABS Oral Take 5 mg by mouth daily.    Marland Kitchen METFORMIN HCL 500 MG PO TABS Oral Take 500 mg by mouth 2 (two) times daily with a meal.    . ROSUVASTATIN CALCIUM 20 MG PO TABS Oral Take 20 mg by mouth daily.      BP 129/72  Pulse 101  Temp(Src) 98.6 F (37 C) (Oral)  Resp 18  SpO2 99%  Physical Exam  Nursing note and vitals reviewed. Constitutional: He is oriented to person, place, and time. He appears well-developed and well-nourished. No distress.  HENT:  Head: Normocephalic.  Cardiovascular: Normal rate and regular rhythm.   Pulmonary/Chest: Effort normal and breath sounds normal.  Musculoskeletal:       Right BKA. Fall range of motion of right knee. No significant swelling around right knee. Negative anterior posterior drawer test. No increased laxity with valgus or varus stress. Incision to bottom of right stump clean dry and intact with staples in place. Patient does have tenderness over distal tibia. There is no deformity or step-off.  Neurological: He is alert and oriented to person, place, and time.  Skin: Skin is warm.  Psychiatric: He has a normal mood and affect. His behavior is normal.    ED Course  Procedures    Dg Tibia/fibula Right  07/28/2011  *RADIOLOGY REPORT*  Clinical Data: Status post fall; pain at the right leg stump.  RIGHT TIBIA AND FIBULA - 2 VIEW  Comparison: Right knee radiographs performed earlier today at 07:58 p.m.  Findings: No radiopaque foreign bodies are identified.  Scattered soft tissue calcifications are noted about the stump; the visualized remaining osseous structures are grossly unremarkable in appearance.  The knee joint is within normal limits; no knee joint effusion is identified.  Overlying skin staples are seen.  IMPRESSION: No radiopaque foreign bodies identified; remaining visualized osseous structures are grossly unremarkable in appearance.  Original Report Authenticated By: Santa Lighter, M.D.   Dg Knee Complete 4 Views Right  07/27/2011   *RADIOLOGY REPORT*  Clinical Data: Right knee pain following a fall.  RIGHT KNEE - COMPLETE 4+ VIEW  Comparison: None.  Findings: Possible fracture through the lateral tibial spine. Otherwise, no fractures or dislocations are seen.  No effusion is visualized.  IMPRESSION: Possible fracture through the lateral tibial spine.  Original Report Authenticated By: Gerald Stabs, M.D.     1. Fall   2. Contusion       MDM  10:45 PM patient seen and evaluated. Patient in no acute distress.  Patient with no significant findings on right knee exam. Denies any pain to this area. Patient does have pain to the end of right stump and distal tibia. Incision line clean dry and intact with staples in place. At this time do not suspect finding on knee x-ray of tibial spine fracture likely given normal knee exam. Plan have patient followup with PCP.      Martie Lee, [place] 07/28/11 [phone]

## 2011-07-28 MED ORDER — HYDROCODONE-ACETAMINOPHEN 5-325 MG PO TABS: 1.0000 | ORAL_TABLET | ORAL | Status: AC | PRN

## 2011-07-28 NOTE — Discharge Instructions (Signed)
Your x-rays today have not shown any concerning injuries from a fall. Please followup with your primary care provider as instructed.   Bone Bruise  A bone bruise is a small hidden fracture of the bone. It typically occurs with bones located close to the surface of the skin.  SYMPTOMS  The pain lasts longer than a normal bruise.   The bruised area is difficult to use.   There may be discoloration or swelling of the bruised area.   When a bone bruise is found with injury to the anterior cruciate ligament (in the knee) there is often an increased:   Amount of fluid in the knee   Time the fluid in the knee lasts.   Number of days until you are walking normally and regaining the motion you had before the injury.   Number of days with pain from the injury.  DIAGNOSIS  It can only be seen on X-rays known as MRIs. This stands for magnetic resonance imaging. A regular X-ray taken of a bone bruise would appear to be normal. A bone bruise is a common injury in the knee and the heel bone (calcaneus). The problems are similar to those produced by stress fractures, which are bone injuries caused by overuse. A bone bruise may also be a sign of other injuries. For example, bone bruises are commonly found where an anterior cruciate ligament (ACL) in the knee has been pulled away from the bone (ruptured). A ligament is a tough fibrous material that connects bones together to make our joints stable. Bruises of the bone last a lot longer than bruises of the muscle or tissues beneath the skin. Bone bruises can last from days to months and are often more severe and painful than other bruises. TREATMENT Because bone bruises are sudden injuries you cannot often prevent them, other than by being extremely careful. Some things you can do to improve the condition are:  Apply ice to the sore area for 15 to 20 minutes, 3 to 4 times per day while awake for the first 2 days. Put the ice in a plastic bag, and place a  towel between the bag of ice and your skin.   Keep your bruised area raised (elevated) when possible to lessen swelling.   For activity:   Use crutches when necessary; do not put weight on the injured leg until you are no longer tender.   You may walk on your affected part as the pain allows, or as instructed.   Start weight bearing gradually on the bruised part.   Continue to use crutches or a cane until you can stand without causing pain, or as instructed.   If a plaster splint was applied, wear the splint until you are seen for a follow-up examination. Rest it on nothing harder than a pillow the first 24 hours. Do not put weight on it. Do not get it wet. You may take it off to take a shower or bath.   If an air splint was applied, more air may be blown into or out of the splint as needed for comfort. You may take it off at night and to take a shower or bath.   Wiggle your toes in the splint several times per day if you are able.   You may have been given an elastic bandage to use with the plaster splint or alone. The splint is too tight if you have numbness, tingling or if your foot becomes cold and blue. Adjust  the bandage to make it comfortable.   Only take over-the-counter or prescription medicines for pain, discomfort, or fever as directed by your caregiver.   Follow all instructions for follow up with your caregiver. This includes any orthopedic referrals, physical therapy, and rehabilitation. Any delay in obtaining necessary care could result in a delay or failure of the bones to heal.  SEEK MEDICAL CARE IF:   You have an increase in bruising, swelling, or pain.   You notice coldness of your toes.   You do not get pain relief with medications.  SEEK IMMEDIATE MEDICAL CARE IF:   Your toes are numb or blue.   You have severe pain not controlled with medications.   If any of the problems that caused you to seek care are becoming worse.  Document Released: 07/08/2003  Document Revised: 04/06/2011 Document Reviewed: 11/20/2007 [facility] Patient Information 2012 [place].  Contusion A contusion is a deep bruise. Contusions are the result of an injury that caused bleeding under the skin. The contusion may turn blue, purple, or yellow. Minor injuries will give you a painless contusion, but more severe contusions may stay painful and swollen for a few weeks.  CAUSES  A contusion is usually caused by a blow, trauma, or direct force to an area of the body. SYMPTOMS   Swelling and redness of the injured area.   Bruising of the injured area.   Tenderness and soreness of the injured area.   Pain.  DIAGNOSIS  The diagnosis can be made by taking a history and physical exam. An X-ray, CT scan, or MRI may be needed to determine if there were any associated injuries, such as fractures. TREATMENT  Specific treatment will depend on what area of the body was injured. In general, the best treatment for a contusion is resting, icing, elevating, and applying cold compresses to the injured area. Over-the-counter medicines may also be recommended for pain control. Ask your caregiver what the best treatment is for your contusion. HOME CARE INSTRUCTIONS   Put ice on the injured area.   Put ice in a plastic bag.   Place a towel between your skin and the bag.   Leave the ice on for 15 to 20 minutes, 3 to 4 times a day.   Only take over-the-counter or prescription medicines for pain, discomfort, or fever as directed by your caregiver. Your caregiver may recommend avoiding anti-inflammatory medicines (aspirin, ibuprofen, and naproxen) for 48 hours because these medicines may increase bruising.   Rest the injured area.   If possible, elevate the injured area to reduce swelling.  SEEK IMMEDIATE MEDICAL CARE IF:   You have increased bruising or swelling.   You have pain that is getting worse.   Your swelling or pain is not relieved with medicines.  MAKE SURE YOU:     Understand these instructions.   Will watch your condition.   Will get help right away if you are not doing well or get worse.  Document Released: 01/25/2005 Document Revised: 04/06/2011 Document Reviewed: 02/20/2011 [facility] Patient Information 2012 [place], [place].   RESOURCE GUIDE  Dental Problems  Patients with Medicaid: [place] [facility] [place].  Caroline Cisco Phone:  [phone]                                                  Phone:  [phone]  If unable to pay or uninsured, contact:  [facility] or [facility]. to become qualified for the adult dental clinic.  Chronic Pain Problems Contact [facility]  [phone] Patients need to be referred by their primary care doctor.  Insufficient Money for Medicine Contact [employer]:  call "211" or [place] ([phone])[phone].  No Primary Care Doctor Call [facility]  [phone] Other agencies that provide inexpensive medical care    [place]  [phone]    [facility]  [place]  [phone]    [facility]  [phone]    [facility]  [place]  [place]  [phone] [place]   [phone] (emergency services [phone])  Substance Abuse [facility]  (312)755-1045 [facility] (218)553-5144 [facility] [place] [phone] [facility]inita Pester ([phone] Residential & Outpatient Substance Abuse Program  587-440-7371  Abuse/Neglect [place] (251)733-4121 [place] [phone] (After Hours)  Emergency [place] [phone]  [place] [facility] [place] [phone] [place] [phone]  MRSA Hotline #:   (785)005-2461    [facility] Dept. [address]      [place] Phone:  [phone]                                   Phone:  [phone]                 Phone:  [place] Phone:  [place] [phone] (After Hours)

## 2011-07-28 NOTE — ED Provider Notes (Signed)
Medical screening examination/treatment/procedure(s) were performed by non-physician practitioner and as supervising physician I was immediately available for consultation/collaboration.  Carlisle Beers, MD 07/28/11 [phone]

## 2012-07-12 ENCOUNTER — Other Ambulatory Visit: Payer: Self-pay | Admitting: Family Medicine

## 2012-07-12 DIAGNOSIS — H9220 Otorrhagia, unspecified ear: Secondary | ICD-10-CM

## 2012-07-19 ENCOUNTER — Ambulatory Visit
Admission: RE | Admit: 2012-07-19 | Discharge: 2012-07-19 | Disposition: A | Discharge status: Home / Self Care | Payer: No Typology Code available for payment source | Source: Ambulatory Visit | Attending: Family Medicine | Admitting: Family Medicine

## 2012-07-19 ENCOUNTER — Other Ambulatory Visit: Payer: Self-pay | Admitting: Family Medicine

## 2012-07-19 DIAGNOSIS — IMO0002 Reserved for concepts with insufficient information to code with codable children: Secondary | ICD-10-CM

## 2012-07-19 DIAGNOSIS — H9220 Otorrhagia, unspecified ear: Secondary | ICD-10-CM

## 2012-07-19 MED ORDER — GADOBENATE DIMEGLUMINE 529 MG/ML IV SOLN
14.0000 mL | Freq: Once | INTRAVENOUS | Status: AC | PRN
  Administered 2012-07-19: 14 mL via INTRAVENOUS

## 2012-07-25 ENCOUNTER — Encounter: Payer: Self-pay | Admitting: Neurology

## 2012-07-25 ENCOUNTER — Ambulatory Visit (INDEPENDENT_AMBULATORY_CARE_PROVIDER_SITE_OTHER): Payer: Medicare (Managed Care) | Admitting: Neurology

## 2012-07-25 VITALS — BP 132/72 | HR 82

## 2012-07-25 DIAGNOSIS — Q858 Other phakomatoses, not elsewhere classified: Secondary | ICD-10-CM

## 2012-07-25 DIAGNOSIS — Q859 Phakomatosis, unspecified: Secondary | ICD-10-CM

## 2012-07-25 NOTE — Progress Notes (Signed)
Reason for visit: Abnormal MRI  COVEY BLAZ is a 57 y.o. male  History of present illness:  Mr. Bicksler is a 57 year old right-handed black male with a history of some decreased hearing in the left ear. The patient was seen by his primary care physician, and MRI evaluation of the brain was done. This shows evidence of intracanalicular enhancement of the facial nerve, with surrounding dural enhancement in the left posterior fossa overlying the mastoid sinuses and middle ear. There was some concern about an infectious process. The patient however, has not noted any headache, ear pain, confusion, neck stiffness, or numbness or weakness of the face, arms, or legs. The patient denies any dizziness, double vision, but he has had some decrease in vision of the left eye. 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 The patient denies any changes in balance, or bowel or bladder control. The patient is sent to this office for an evaluation.  Past Medical History  Diagnosis Date  . Hypertension   . Diabetes mellitus   . High cholesterol   . Blurred vision   . Insomnia   . Peripheral artery disease   . GERD (gastroesophageal reflux disease)   . Constipation   . Sensorineural hearing loss   . Port-wine stain of face     Left V1 distribution, including upper eyelid  . Vitamin D deficiency     Past Surgical History  Procedure Laterality Date  . Amputation    . Peripheral arterial stent graft N/A     Family History  Problem Relation Age of Onset  . Diabetes Mother   . Cancer Father     Social history:  reports that he has been smoking Cigarettes.  He has been smoking about 0.50 packs per day. He does not have any smokeless tobacco history on file. He reports that he does not drink alcohol or use illicit drugs.  Medications:  Current Outpatient Prescriptions on  File Prior to Visit  Medication Sig Dispense Refill  . aspirin EC 81 MG tablet Take 81 mg by mouth daily.      . insulin glargine (LANTUS) 100 UNIT/ML injection Inject 20 Units into the skin at bedtime.      Marland Kitchen lisinopril (PRINIVIL,ZESTRIL) 5 MG tablet Take 5 mg by mouth daily.      . metFORMIN (GLUCOPHAGE) 500 MG tablet Take 500 mg by mouth 2 (two) times daily with a meal.      . rosuvastatin (CRESTOR) 20 MG tablet Take 20 mg by mouth daily.       No current facility-administered medications on file prior to visit.    Allergies: No Known Allergies  ROS:  Out of a complete 14 system review of symptoms, the patient complains only of the following symptoms, and all other reviewed systems are negative.  Decreased hearing  Blood pressure 132/72, pulse 82.  Physical Exam  General: The patient is alert and cooperative at the time of the examination.  Head: Pupils are equal, round, and reactive to light. Discs are flat bilaterally. Tympanic membranes are slightly sclerotic bilaterally, slightly erythematous on the left. A port wine stain is seen on the left forehead in the V1 distribution, including the left upper eyelid.  Neck: The neck is supple, no carotid bruits are noted.  Respiratory: The respiratory examination is clear.  Cardiovascular: The cardiovascular examination  reveals a regular rate and rhythm, no obvious murmurs or rubs are noted.  Skin: Extremities are without significant edema on the left, the patient has a below-knee amputation on the right.  Neurologic Exam  Mental status: The patient is alert and cooperative.  Cranial nerves: Facial symmetry is present. There is good sensation of the face to pinprick and soft touch bilaterally. The strength of the facial muscles and the muscles to head turning and shoulder shrug are normal bilaterally. Speech is well enunciated, no aphasia or dysarthria is noted. Extraocular movements are full. Visual fields are full.  Motor: The  motor testing reveals 5 over 5 strength of all 4 extremities. Good symmetric motor tone is noted throughout.  Sensory: Sensory testing is intact to pinprick, soft touch, vibration sensation, and position sense on all 4 extremities, with the exception that there is a pinprick sensory deficit in the distal third of the left leg. The right leg is prosthetic. No evidence of extinction is noted.  Coordination: Cerebellar testing reveals good finger-nose-finger and heel-to-shin bilaterally.  Gait and station: Gait is slightly wide-based, the patient uses a cane for ambulation. Tandem gait is poor. Romberg is negative. No drift is seen  Reflexes: Deep tendon reflexes are symmetric and normal bilaterally. Toes are downgoing bilaterally.   Assessment/Plan:  1. Abnormal MRI the of the brain  2. 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 The patient has a port wine stain that includes the V1 distribution on the left. This birthmark includes the upper eyelid, which has a very high association with intracranial dural based AVM abnormalities. 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 This likely is not an infectious etiology, as the patient has no history supporting this. The patient will be set up for a repeat MRI in 3 months to determine stability. If this study is unchanged, this will increase the likelihood of a benign, congenital process. 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 The patient has no prior history of seizures.  Jill Alexanders MD 07/25/2012 8:41 PM  [facility] [address] [place], [place] [address]  Phone [phone] Fax [phone]

## 2012-08-27 ENCOUNTER — Encounter (HOSPITAL_BASED_OUTPATIENT_CLINIC_OR_DEPARTMENT_OTHER): Payer: Medicare (Managed Care) | Attending: General Surgery

## 2012-08-27 DIAGNOSIS — I10 Essential (primary) hypertension: Secondary | ICD-10-CM | POA: Insufficient documentation

## 2012-08-27 DIAGNOSIS — L97509 Non-pressure chronic ulcer of other part of unspecified foot with unspecified severity: Secondary | ICD-10-CM | POA: Insufficient documentation

## 2012-08-27 DIAGNOSIS — E1169 Type 2 diabetes mellitus with other specified complication: Secondary | ICD-10-CM | POA: Insufficient documentation

## 2012-08-27 DIAGNOSIS — I739 Peripheral vascular disease, unspecified: Secondary | ICD-10-CM | POA: Insufficient documentation

## 2012-08-27 DIAGNOSIS — Z79899 Other long term (current) drug therapy: Secondary | ICD-10-CM | POA: Insufficient documentation

## 2012-08-27 DIAGNOSIS — S88119A Complete traumatic amputation at level between knee and ankle, unspecified lower leg, initial encounter: Secondary | ICD-10-CM | POA: Insufficient documentation

## 2012-08-27 DIAGNOSIS — F17200 Nicotine dependence, unspecified, uncomplicated: Secondary | ICD-10-CM | POA: Insufficient documentation

## 2012-08-27 DIAGNOSIS — X19XXXA Contact with other heat and hot substances, initial encounter: Secondary | ICD-10-CM | POA: Insufficient documentation

## 2012-08-27 DIAGNOSIS — H3500 Unspecified background retinopathy: Secondary | ICD-10-CM | POA: Insufficient documentation

## 2012-08-27 DIAGNOSIS — T25339A Burn of third degree of unspecified toe(s) (nail), initial encounter: Secondary | ICD-10-CM | POA: Insufficient documentation
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
## 2012-08-30 ENCOUNTER — Other Ambulatory Visit: Payer: Self-pay | Admitting: *Deleted

## 2012-08-30 ENCOUNTER — Ambulatory Visit
Admission: RE | Admit: 2012-08-30 | Discharge: 2012-08-30 | Disposition: A | Discharge status: Home / Self Care | Payer: PRIVATE HEALTH INSURANCE | Source: Ambulatory Visit | Attending: *Deleted | Admitting: *Deleted

## 2012-08-30 DIAGNOSIS — R52 Pain, unspecified: Secondary | ICD-10-CM

## 2012-09-03 ENCOUNTER — Encounter (HOSPITAL_BASED_OUTPATIENT_CLINIC_OR_DEPARTMENT_OTHER): Payer: Medicare (Managed Care) | Attending: General Surgery

## 2012-09-03 DIAGNOSIS — S88119A Complete traumatic amputation at level between knee and ankle, unspecified lower leg, initial encounter: Secondary | ICD-10-CM | POA: Insufficient documentation

## 2012-09-03 DIAGNOSIS — M9080 Osteopathy in diseases classified elsewhere, unspecified site: Secondary | ICD-10-CM | POA: Insufficient documentation

## 2012-09-03 DIAGNOSIS — L97509 Non-pressure chronic ulcer of other part of unspecified foot with unspecified severity: Secondary | ICD-10-CM | POA: Insufficient documentation

## 2012-09-03 DIAGNOSIS — E1149 Type 2 diabetes mellitus with other diabetic neurological complication: Secondary | ICD-10-CM | POA: Insufficient documentation

## 2012-09-03 DIAGNOSIS — Z79899 Other long term (current) drug therapy: Secondary | ICD-10-CM | POA: Insufficient documentation

## 2012-09-03 DIAGNOSIS — Z792 Long term (current) use of antibiotics: Secondary | ICD-10-CM | POA: Insufficient documentation

## 2012-09-03 DIAGNOSIS — E1142 Type 2 diabetes mellitus with diabetic polyneuropathy: Secondary | ICD-10-CM | POA: Insufficient documentation

## 2012-09-03 DIAGNOSIS — M869 Osteomyelitis, unspecified: Secondary | ICD-10-CM | POA: Insufficient documentation

## 2012-09-03 DIAGNOSIS — E1169 Type 2 diabetes mellitus with other specified complication: Secondary | ICD-10-CM | POA: Insufficient documentation

## 2012-09-03 DIAGNOSIS — I10 Essential (primary) hypertension: Secondary | ICD-10-CM | POA: Insufficient documentation
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
## 2012-09-06 ENCOUNTER — Other Ambulatory Visit (HOSPITAL_COMMUNITY): Payer: Self-pay | Admitting: General Surgery

## 2012-09-06 DIAGNOSIS — Z09 Encounter for follow-up examination after completed treatment for conditions other than malignant neoplasm: Secondary | ICD-10-CM

## 2012-09-18 ENCOUNTER — Other Ambulatory Visit: Payer: Self-pay | Admitting: *Deleted

## 2012-09-18 ENCOUNTER — Ambulatory Visit
Admission: RE | Admit: 2012-09-18 | Discharge: 2012-09-18 | Disposition: A | Discharge status: Home / Self Care | Payer: PRIVATE HEALTH INSURANCE | Source: Ambulatory Visit | Attending: *Deleted | Admitting: *Deleted

## 2012-09-18 DIAGNOSIS — R0602 Shortness of breath: Secondary | ICD-10-CM

## 2012-09-25 ENCOUNTER — Encounter (HOSPITAL_COMMUNITY): Payer: PRIVATE HEALTH INSURANCE

## 2012-09-25 LAB — GLUCOSE, CAPILLARY: Glucose-Capillary: 250 mg/dL — ABNORMAL HIGH (ref 70–99)

## 2012-09-26 ENCOUNTER — Encounter: Payer: Self-pay | Admitting: Infectious Disease

## 2012-09-26 ENCOUNTER — Encounter: Payer: Self-pay | Admitting: *Deleted

## 2012-09-26 ENCOUNTER — Ambulatory Visit (INDEPENDENT_AMBULATORY_CARE_PROVIDER_SITE_OTHER): Payer: PRIVATE HEALTH INSURANCE | Admitting: Infectious Disease

## 2012-09-26 ENCOUNTER — Telehealth: Payer: Self-pay | Admitting: *Deleted

## 2012-09-26 VITALS — BP 153/80 | HR 87 | Temp 98.5°F | Ht 68.5 in | Wt 152.0 lb

## 2012-09-26 DIAGNOSIS — H903 Sensorineural hearing loss, bilateral: Secondary | ICD-10-CM | POA: Insufficient documentation

## 2012-09-26 DIAGNOSIS — I70209 Unspecified atherosclerosis of native arteries of extremities, unspecified extremity: Secondary | ICD-10-CM

## 2012-09-26 DIAGNOSIS — G4700 Insomnia, unspecified: Secondary | ICD-10-CM

## 2012-09-26 DIAGNOSIS — S88119A Complete traumatic amputation at level between knee and ankle, unspecified lower leg, initial encounter: Secondary | ICD-10-CM

## 2012-09-26 DIAGNOSIS — K089 Disorder of teeth and supporting structures, unspecified: Secondary | ICD-10-CM

## 2012-09-26 DIAGNOSIS — K219 Gastro-esophageal reflux disease without esophagitis: Secondary | ICD-10-CM | POA: Insufficient documentation

## 2012-09-26 DIAGNOSIS — E1139 Type 2 diabetes mellitus with other diabetic ophthalmic complication: Secondary | ICD-10-CM

## 2012-09-26 DIAGNOSIS — E1149 Type 2 diabetes mellitus with other diabetic neurological complication: Secondary | ICD-10-CM

## 2012-09-26 DIAGNOSIS — M869 Osteomyelitis, unspecified: Secondary | ICD-10-CM | POA: Insufficient documentation

## 2012-09-26 DIAGNOSIS — I10 Essential (primary) hypertension: Secondary | ICD-10-CM

## 2012-09-26 DIAGNOSIS — F17200 Nicotine dependence, unspecified, uncomplicated: Secondary | ICD-10-CM

## 2012-09-26 DIAGNOSIS — I739 Peripheral vascular disease, unspecified: Secondary | ICD-10-CM

## 2012-09-26 DIAGNOSIS — I96 Gangrene, not elsewhere classified: Secondary | ICD-10-CM

## 2012-09-26 DIAGNOSIS — H538 Other visual disturbances: Secondary | ICD-10-CM

## 2012-09-26 DIAGNOSIS — Z0000 Encounter for general adult medical examination without abnormal findings: Secondary | ICD-10-CM

## 2012-09-26 DIAGNOSIS — K029 Dental caries, unspecified: Secondary | ICD-10-CM | POA: Insufficient documentation

## 2012-09-26 DIAGNOSIS — E559 Vitamin D deficiency, unspecified: Secondary | ICD-10-CM

## 2012-09-26 DIAGNOSIS — K5900 Constipation, unspecified: Secondary | ICD-10-CM | POA: Insufficient documentation

## 2012-09-26 DIAGNOSIS — E7800 Pure hypercholesterolemia, unspecified: Secondary | ICD-10-CM

## 2012-09-26 DIAGNOSIS — Z89511 Acquired absence of right leg below knee: Secondary | ICD-10-CM

## 2012-09-26 DIAGNOSIS — E1159 Type 2 diabetes mellitus with other circulatory complications: Secondary | ICD-10-CM
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
## 2012-09-26 NOTE — Telephone Encounter (Signed)
1 - MRI PA initiated for osteomyelitis of L great toe. 2 - levaquin 500mg  daily stopped.  Changed to  levaquin 750mg  daily and doxycycline 100 mg BID.  Orders faxed to [phone] Beverley Fiedler.  [name], RN

## 2012-09-27 LAB — COMPLETE METABOLIC PANEL WITH GFR
Albumin: 3.5 g/dL (ref 3.5–5.2)
BUN: 12 mg/dL (ref 6–23)
Calcium: 9 mg/dL (ref 8.4–10.5)
Chloride: 106 mEq/L (ref 96–112)
Creat: 0.86 mg/dL (ref 0.50–1.35)
GFR, Est Non African American: >89 mL/min
Glucose, Bld: 221 mg/dL — ABNORMAL HIGH (ref 70–99)
Potassium: 4.1 mEq/L (ref 3.5–5.3)

## 2012-09-27 LAB — CBC WITH DIFFERENTIAL/PLATELET
Basophils Relative: 0 % (ref 0–1)
Eosinophils Absolute: 0.2 10*3/uL (ref 0.0–0.7)
Eosinophils Relative: 2 % (ref 0–5)
Lymphs Abs: 2.1 10*3/uL (ref 0.7–4.0)
MCH: 24.9 pg — ABNORMAL LOW (ref 26.0–34.0)
MCHC: 33.1 g/dL (ref 30.0–36.0)
MCV: 75.1 fL — ABNORMAL LOW (ref 78.0–100.0)
Monocytes Relative: 8 % (ref 3–12)
Platelets: 269 10*3/uL (ref 150–400)
RBC: 5.55 MIL/uL (ref 4.22–5.81)

## 2012-09-30 ENCOUNTER — Encounter (HOSPITAL_BASED_OUTPATIENT_CLINIC_OR_DEPARTMENT_OTHER): Payer: PRIVATE HEALTH INSURANCE | Attending: General Surgery

## 2012-09-30 LAB — GLUCOSE, CAPILLARY: Glucose-Capillary: 243 mg/dL — ABNORMAL HIGH (ref 70–99)

## 2012-10-01 ENCOUNTER — Encounter: Payer: Self-pay | Admitting: Vascular Surgery

## 2012-10-01 ENCOUNTER — Other Ambulatory Visit: Payer: Self-pay

## 2012-10-01 ENCOUNTER — Encounter (HOSPITAL_COMMUNITY): Payer: Self-pay | Admitting: *Deleted

## 2012-10-01 ENCOUNTER — Telehealth: Payer: Self-pay | Admitting: *Deleted

## 2012-10-01 ENCOUNTER — Ambulatory Visit (INDEPENDENT_AMBULATORY_CARE_PROVIDER_SITE_OTHER): Payer: PRIVATE HEALTH INSURANCE | Admitting: Vascular Surgery

## 2012-10-01 ENCOUNTER — Telehealth: Payer: Self-pay | Admitting: Vascular Surgery

## 2012-10-01 ENCOUNTER — Encounter (INDEPENDENT_AMBULATORY_CARE_PROVIDER_SITE_OTHER): Payer: PRIVATE HEALTH INSURANCE

## 2012-10-01 VITALS — BP 176/89 | HR 81 | Resp 16 | Ht 66.0 in | Wt 155.0 lb

## 2012-10-01 DIAGNOSIS — I739 Peripheral vascular disease, unspecified: Secondary | ICD-10-CM

## 2012-10-01 DIAGNOSIS — L97909 Non-pressure chronic ulcer of unspecified part of unspecified lower leg with unspecified severity: Secondary | ICD-10-CM

## 2012-10-01 DIAGNOSIS — L98499 Non-pressure chronic ulcer of skin of other sites with unspecified severity: Secondary | ICD-10-CM

## 2012-10-01 DIAGNOSIS — I70209 Unspecified atherosclerosis of native arteries of extremities, unspecified extremity: Secondary | ICD-10-CM | POA: Insufficient documentation

## 2012-10-01 NOTE — Telephone Encounter (Addendum)
Message copied by Gena Fray on Tue Oct 01, 2012  9:04 AM ------      Message from: [name]      Created: Mon Sep 30, 2012  1:48 PM      Regarding: needs appt. ASAP       [name] from [facility] called to say Dr. [name] wants pt's appt. STAT.  I tried to call her back to get more information, but got her VM.  Do you have any ideas about making this a STAT appt.?      Her ph. # [phone] Thx. ------   [name] and I have arranged for [name] to see [name] on 06/03/14[name] contacted [facility] and I spoke with the patients wife Gary [name], dpm

## 2012-10-01 NOTE — Addendum Note (Signed)
Addended by: Mena Goes on: 10/01/2012 11:23 AM   Modules accepted: Orders

## 2012-10-01 NOTE — Telephone Encounter (Signed)
??   Which surgeon has seen him? He needs an amputation of his toe in my opinion, very strange

## 2012-10-01 NOTE — Telephone Encounter (Signed)
Received referral request back from Dr. Nona Dell office as they were unable to contact the patient.  I contacted [facility] to check for updated contact information and was transferred to their nursing station.  Per their nurse, Mr Gary [name] has already been seen by ortho and general surgeons at their own doctor's request (Dr. [name]).  I asked that she send [place] the results of those visits.  Per their nurse, the MRI was approved and completed; she is faxing [place] the results.  I confirmed that they had the correct medications ordered as per Dr. Lucianne Lei Dam's recommendations at his office visit 09/26/12.   The nurse at [facility] asked that we send her any further referrals for Dr. [name] to issue from [facility]. [name], RN
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
## 2012-10-02 ENCOUNTER — Encounter (HOSPITAL_COMMUNITY): Payer: Self-pay | Admitting: Respiratory Therapy

## 2012-10-02 MED ORDER — CEFUROXIME SODIUM 1.5 G IJ SOLR
1.5000 g | INTRAMUSCULAR | Status: AC
  Administered 2012-10-03: 1.5 g via INTRAVENOUS
  Filled 2012-10-02: qty 1.5

## 2012-10-03 ENCOUNTER — Inpatient Hospital Stay (HOSPITAL_COMMUNITY)
Admission: RE | Admit: 2012-10-03 | Discharge: 2012-10-04 | DRG: 256 | Disposition: A | Discharge status: Home / Self Care | Payer: Medicare (Managed Care) | Source: Ambulatory Visit | Attending: Surgery | Admitting: Surgery

## 2012-10-03 ENCOUNTER — Encounter (HOSPITAL_COMMUNITY)
Admission: RE | Disposition: A | Discharge status: Home / Self Care | Payer: Self-pay | Source: Ambulatory Visit | Attending: Surgery

## 2012-10-03 ENCOUNTER — Encounter (HOSPITAL_COMMUNITY): Payer: Self-pay | Admitting: *Deleted

## 2012-10-03 ENCOUNTER — Encounter (HOSPITAL_COMMUNITY): Payer: Self-pay | Admitting: Anesthesiology

## 2012-10-03 ENCOUNTER — Ambulatory Visit (HOSPITAL_COMMUNITY): Payer: Medicare (Managed Care) | Admitting: Anesthesiology

## 2012-10-03 ENCOUNTER — Other Ambulatory Visit: Payer: PRIVATE HEALTH INSURANCE

## 2012-10-03 DIAGNOSIS — F17200 Nicotine dependence, unspecified, uncomplicated: Secondary | ICD-10-CM | POA: Diagnosis present

## 2012-10-03 DIAGNOSIS — E1159 Type 2 diabetes mellitus with other circulatory complications: Principal | ICD-10-CM | POA: Diagnosis present

## 2012-10-03 DIAGNOSIS — E1169 Type 2 diabetes mellitus with other specified complication: Secondary | ICD-10-CM | POA: Diagnosis present

## 2012-10-03 DIAGNOSIS — E1149 Type 2 diabetes mellitus with other diabetic neurological complication: Secondary | ICD-10-CM | POA: Diagnosis present

## 2012-10-03 DIAGNOSIS — K219 Gastro-esophageal reflux disease without esophagitis: Secondary | ICD-10-CM | POA: Diagnosis present

## 2012-10-03 DIAGNOSIS — E559 Vitamin D deficiency, unspecified: Secondary | ICD-10-CM | POA: Diagnosis present

## 2012-10-03 DIAGNOSIS — M9080 Osteopathy in diseases classified elsewhere, unspecified site: Secondary | ICD-10-CM | POA: Diagnosis present

## 2012-10-03 DIAGNOSIS — E7800 Pure hypercholesterolemia, unspecified: Secondary | ICD-10-CM | POA: Diagnosis present

## 2012-10-03 DIAGNOSIS — Z794 Long term (current) use of insulin: Secondary | ICD-10-CM

## 2012-10-03 DIAGNOSIS — G4700 Insomnia, unspecified: Secondary | ICD-10-CM | POA: Diagnosis present

## 2012-10-03 DIAGNOSIS — Q825 Congenital non-neoplastic nevus: Secondary | ICD-10-CM

## 2012-10-03 DIAGNOSIS — Z79899 Other long term (current) drug therapy: Secondary | ICD-10-CM

## 2012-10-03 DIAGNOSIS — Z833 Family history of diabetes mellitus: Secondary | ICD-10-CM

## 2012-10-03 DIAGNOSIS — K5900 Constipation, unspecified: Secondary | ICD-10-CM | POA: Diagnosis present

## 2012-10-03 DIAGNOSIS — E1142 Type 2 diabetes mellitus with diabetic polyneuropathy: Secondary | ICD-10-CM | POA: Diagnosis present

## 2012-10-03 DIAGNOSIS — I10 Essential (primary) hypertension: Secondary | ICD-10-CM | POA: Diagnosis present

## 2012-10-03 DIAGNOSIS — I739 Peripheral vascular disease, unspecified: Secondary | ICD-10-CM

## 2012-10-03 DIAGNOSIS — Z7982 Long term (current) use of aspirin: Secondary | ICD-10-CM

## 2012-10-03 DIAGNOSIS — I70269 Atherosclerosis of native arteries of extremities with gangrene, unspecified extremity: Secondary | ICD-10-CM

## 2012-10-03 DIAGNOSIS — S88119A Complete traumatic amputation at level between knee and ankle, unspecified lower leg, initial encounter: Secondary | ICD-10-CM

## 2012-10-03 DIAGNOSIS — M869 Osteomyelitis, unspecified: Secondary | ICD-10-CM | POA: Diagnosis present

## 2012-10-03 DIAGNOSIS — H905 Unspecified sensorineural hearing loss: Secondary | ICD-10-CM | POA: Diagnosis present

## 2012-10-03 HISTORY — PX: AMPUTATION: SHX166

## 2012-10-03 LAB — CBC
HCT: 39.9 % (ref 39.0–52.0)
MCH: 25 pg — ABNORMAL LOW (ref 26.0–34.0)
MCV: 76.1 fL — ABNORMAL LOW (ref 78.0–100.0)
Platelets: 235 10*3/uL (ref 150–400)
RBC: 5.24 MIL/uL (ref 4.22–5.81)
WBC: 9.5 10*3/uL (ref 4.0–10.5)

## 2012-10-03 LAB — CREATININE, SERUM: GFR calc Af Amer: >90 mL/min (ref >90)

## 2012-10-03 LAB — GLUCOSE, CAPILLARY
Glucose-Capillary: 195 mg/dL — ABNORMAL HIGH (ref 70–99)
Glucose-Capillary: 188 mg/dL — ABNORMAL HIGH (ref 70–99)
Glucose-Capillary: 157 mg/dL — ABNORMAL HIGH (ref 70–99)
Glucose-Capillary: 147 mg/dL — ABNORMAL HIGH (ref 70–99)
Glucose-Capillary: 202 mg/dL — ABNORMAL HIGH (ref 70–99)

## 2012-10-03 LAB — POCT I-STAT 4, (NA,K, GLUC, HGB,HCT)
Glucose, Bld: 223 mg/dL — ABNORMAL HIGH (ref 70–99)
HCT: 47 % (ref 39.0–52.0)
Hemoglobin: 16 g/dL (ref 13.0–17.0)

## 2012-10-03 SURGERY — AMPUTATION DIGIT
Anesthesia: General | Site: Toe | Laterality: Left | Wound class: Contaminated

## 2012-10-03 MED ORDER — OXYCODONE HCL 5 MG/5ML PO SOLN
5.0000 mg | Freq: Once | ORAL | Status: DC | PRN
Start: 2012-10-03 — End: 2012-10-03

## 2012-10-03 MED ORDER — HYDROMORPHONE HCL PF 1 MG/ML IJ SOLN
0.2500 mg | INTRAMUSCULAR | Status: DC | PRN
  Administered 2012-10-03: 0.5 mg via INTRAVENOUS

## 2012-10-03 MED ORDER — ONDANSETRON HCL 4 MG/2ML IJ SOLN: 4.0000 mg | Freq: Four times a day (QID) | INTRAMUSCULAR | Status: DC | PRN

## 2012-10-03 MED ORDER — PHENOL 1.4 % MT LIQD
1.0000 | OROMUCOSAL | Status: DC | PRN
  Filled 2012-10-03: qty 177

## 2012-10-03 MED ORDER — LIDOCAINE HCL (PF) 1 % IJ SOLN: INTRAMUSCULAR | Status: DC | PRN

## 2012-10-03 MED ORDER — MUPIROCIN 2 % EX OINT: TOPICAL_OINTMENT | Freq: Two times a day (BID) | CUTANEOUS | Status: DC

## 2012-10-03 MED ORDER — LISINOPRIL 10 MG PO TABS
10.0000 mg | ORAL_TABLET | Freq: Every day | ORAL | Status: DC
  Administered 2012-10-03 – 2012-10-04 (×2): 10 mg via ORAL
  Filled 2012-10-03 (×2): qty 1

## 2012-10-03 MED ORDER — ZOLPIDEM TARTRATE 5 MG PO TABS: 5.0000 mg | ORAL_TABLET | Freq: Every evening | ORAL | Status: DC | PRN

## 2012-10-03 MED ORDER — PANTOPRAZOLE SODIUM 40 MG PO TBEC: 40.0000 mg | DELAYED_RELEASE_TABLET | Freq: Every day | ORAL | Status: DC

## 2012-10-03 MED ORDER — MAGNESIUM SULFATE 40 MG/ML IJ SOLN
2.0000 g | Freq: Once | INTRAMUSCULAR | Status: AC | PRN
  Filled 2012-10-03: qty 50

## 2012-10-03 MED ORDER — 0.9 % SODIUM CHLORIDE (POUR BTL) OPTIME
TOPICAL | Status: DC | PRN
  Administered 2012-10-03: 1000 mL

## 2012-10-03 MED ORDER — METFORMIN HCL 500 MG PO TABS
1000.0000 mg | ORAL_TABLET | Freq: Two times a day (BID) | ORAL | Status: DC
Start: 2012-10-03 — End: 2012-10-04
  Administered 2012-10-03: 1000 mg via ORAL
  Filled 2012-10-03 (×5): qty 2

## 2012-10-03 MED ORDER — SODIUM CHLORIDE 0.9 % IV SOLN: INTRAVENOUS | Status: DC

## 2012-10-03 MED ORDER — MORPHINE SULFATE 2 MG/ML IJ SOLN
2.0000 mg | INTRAMUSCULAR | Status: DC | PRN
  Administered 2012-10-03: 2 mg via INTRAVENOUS
  Administered 2012-10-03 – 2012-10-04 (×2): 4 mg via INTRAVENOUS
  Administered 2012-10-04: 2 mg via INTRAVENOUS
  Filled 2012-10-03: qty 1
  Filled 2012-10-03 (×2): qty 2
  Filled 2012-10-03: qty 1

## 2012-10-03 MED ORDER — HYDROMORPHONE HCL PF 1 MG/ML IJ SOLN
INTRAMUSCULAR | Status: AC
  Filled 2012-10-03: qty 1

## 2012-10-03 MED ORDER — GLIMEPIRIDE 1 MG PO TABS
1.0000 mg | ORAL_TABLET | Freq: Every day | ORAL | Status: DC
  Administered 2012-10-03 – 2012-10-04 (×2): 1 mg via ORAL
  Filled 2012-10-03 (×2): qty 1

## 2012-10-03 MED ORDER — PROPOFOL 10 MG/ML IV BOLUS
INTRAVENOUS | Status: DC | PRN
  Administered 2012-10-03: 140 mg via INTRAVENOUS

## 2012-10-03 MED ORDER — LACTATED RINGERS IV SOLN
INTRAVENOUS | Status: DC
  Administered 2012-10-03: 10:00:00 via INTRAVENOUS

## 2012-10-03 MED ORDER — DEXTROSE 5 % IV SOLN
1.5000 g | Freq: Two times a day (BID) | INTRAVENOUS | Status: AC
  Administered 2012-10-03 – 2012-10-04 (×2): 1.5 g via INTRAVENOUS
  Filled 2012-10-03 (×2): qty 1.5

## 2012-10-03 MED ORDER — HYDRALAZINE HCL 20 MG/ML IJ SOLN: 10.0000 mg | INTRAMUSCULAR | Status: DC | PRN

## 2012-10-03 MED ORDER — LIDOCAINE HCL (PF) 1 % IJ SOLN
INTRAMUSCULAR | Status: AC
  Filled 2012-10-03: qty 30

## 2012-10-03 MED ORDER — INSULIN GLARGINE 100 UNIT/ML ~~LOC~~ SOLN
40.0000 [IU] | Freq: Every day | SUBCUTANEOUS | Status: DC
  Administered 2012-10-03: 40 [IU] via SUBCUTANEOUS
  Filled 2012-10-03 (×2): qty 0.4

## 2012-10-03 MED ORDER — CALCIUM CARBONATE ANTACID 500 MG PO CHEW
2.0000 | CHEWABLE_TABLET | Freq: Every day | ORAL | Status: DC | PRN
  Filled 2012-10-03: qty 2

## 2012-10-03 MED ORDER — FENTANYL CITRATE 0.05 MG/ML IJ SOLN
INTRAMUSCULAR | Status: DC | PRN
  Administered 2012-10-03: 50 ug via INTRAVENOUS

## 2012-10-03 MED ORDER — ACETAMINOPHEN 325 MG PO TABS: 325.0000 mg | ORAL_TABLET | ORAL | Status: DC | PRN

## 2012-10-03 MED ORDER — ENOXAPARIN SODIUM 40 MG/0.4ML ~~LOC~~ SOLN
40.0000 mg | SUBCUTANEOUS | Status: DC
  Administered 2012-10-04: 40 mg via SUBCUTANEOUS
  Filled 2012-10-03 (×2): qty 0.4

## 2012-10-03 MED ORDER — MIDAZOLAM HCL 5 MG/5ML IJ SOLN
INTRAMUSCULAR | Status: DC | PRN
  Administered 2012-10-03: 1 mg via INTRAVENOUS

## 2012-10-03 MED ORDER — LABETALOL HCL 5 MG/ML IV SOLN
10.0000 mg | INTRAVENOUS | Status: DC | PRN
  Filled 2012-10-03: qty 4

## 2012-10-03 MED ORDER — LIDOCAINE HCL (CARDIAC) 20 MG/ML IV SOLN
INTRAVENOUS | Status: DC | PRN
  Administered 2012-10-03: 80 mg via INTRAVENOUS

## 2012-10-03 MED ORDER — GUAIFENESIN-DM 100-10 MG/5ML PO SYRP: 15.0000 mL | ORAL_SOLUTION | ORAL | Status: DC | PRN

## 2012-10-03 MED ORDER — VITAMIN D3 25 MCG (1000 UNIT) PO TABS
1000.0000 [IU] | ORAL_TABLET | Freq: Every day | ORAL | Status: DC
  Administered 2012-10-03 – 2012-10-04 (×2): 1000 [IU] via ORAL
  Filled 2012-10-03 (×2): qty 1

## 2012-10-03 MED ORDER — DOCUSATE SODIUM 100 MG PO CAPS: 100.0000 mg | ORAL_CAPSULE | Freq: Every evening | ORAL | Status: DC | PRN

## 2012-10-03 MED ORDER — ASPIRIN EC 81 MG PO TBEC
81.0000 mg | DELAYED_RELEASE_TABLET | Freq: Every day | ORAL | Status: DC
  Administered 2012-10-04: 81 mg via ORAL
  Filled 2012-10-03: qty 1

## 2012-10-03 MED ORDER — METOPROLOL TARTRATE 1 MG/ML IV SOLN: 2.0000 mg | INTRAVENOUS | Status: DC | PRN

## 2012-10-03 MED ORDER — MUPIROCIN 2 % EX OINT
TOPICAL_OINTMENT | CUTANEOUS | Status: AC
  Administered 2012-10-03: 1 via NASAL
  Filled 2012-10-03: qty 22

## 2012-10-03 MED ORDER — VARENICLINE TARTRATE 1 MG PO TABS
1.0000 mg | ORAL_TABLET | Freq: Two times a day (BID) | ORAL | Status: DC
Start: 2012-10-03 — End: 2012-10-04
  Administered 2012-10-03 – 2012-10-04 (×3): 1 mg via ORAL
  Filled 2012-10-03 (×4): qty 1

## 2012-10-03 MED ORDER — OXYCODONE-ACETAMINOPHEN 5-325 MG PO TABS
1.0000 | ORAL_TABLET | ORAL | Status: DC | PRN
  Administered 2012-10-03 – 2012-10-04 (×2): 2 via ORAL
  Filled 2012-10-03 (×2): qty 2

## 2012-10-03 MED ORDER — POTASSIUM CHLORIDE CRYS ER 20 MEQ PO TBCR: 20.0000 meq | EXTENDED_RELEASE_TABLET | Freq: Once | ORAL | Status: AC | PRN

## 2012-10-03 MED ORDER — COLLAGENASE 250 UNIT/GM EX OINT
1.0000 "application " | TOPICAL_OINTMENT | Freq: Every day | CUTANEOUS | Status: DC
  Filled 2012-10-03: qty 30

## 2012-10-03 MED ORDER — PANTOPRAZOLE SODIUM 40 MG PO TBEC
40.0000 mg | DELAYED_RELEASE_TABLET | Freq: Every day | ORAL | Status: DC
  Administered 2012-10-03 – 2012-10-04 (×2): 40 mg via ORAL
  Filled 2012-10-03 (×2): qty 1

## 2012-10-03 MED ORDER — OXYCODONE HCL 5 MG PO TABS: 5.0000 mg | ORAL_TABLET | Freq: Once | ORAL | Status: DC | PRN

## 2012-10-03 MED ORDER — ALUM & MAG HYDROXIDE-SIMETH 200-200-20 MG/5ML PO SUSP: 15.0000 mL | ORAL | Status: DC | PRN

## 2012-10-03 MED ORDER — EPHEDRINE SULFATE 50 MG/ML IJ SOLN
INTRAMUSCULAR | Status: DC | PRN
  Administered 2012-10-03: 10 mg via INTRAVENOUS

## 2012-10-03 MED ORDER — MEPERIDINE HCL 25 MG/ML IJ SOLN: 6.2500 mg | INTRAMUSCULAR | Status: DC | PRN

## 2012-10-03 MED ORDER — DOCUSATE SODIUM 100 MG PO CAPS
100.0000 mg | ORAL_CAPSULE | Freq: Every day | ORAL | Status: DC
  Administered 2012-10-04: 100 mg via ORAL
  Filled 2012-10-03: qty 1

## 2012-10-03 MED ORDER — ATORVASTATIN CALCIUM 10 MG PO TABS
10.0000 mg | ORAL_TABLET | Freq: Every day | ORAL | Status: DC
  Administered 2012-10-03: 10 mg via ORAL
  Filled 2012-10-03 (×2): qty 1

## 2012-10-03 MED ORDER — ACETAMINOPHEN 650 MG RE SUPP: 325.0000 mg | RECTAL | Status: DC | PRN

## 2012-10-03 SURGICAL SUPPLY — 36 items
BANDAGE CONFORM 3  STR LF (GAUZE/BANDAGES/DRESSINGS) IMPLANT
BANDAGE ELASTIC 4 VELCRO ST LF (GAUZE/BANDAGES/DRESSINGS) ×2 IMPLANT
BANDAGE GAUZE 4  KLING STR (GAUZE/BANDAGES/DRESSINGS) ×2 IMPLANT
BANDAGE GAUZE ELAST BULKY 4 IN (GAUZE/BANDAGES/DRESSINGS) ×2 IMPLANT
BLADE AVERAGE 25X9 (BLADE) IMPLANT
BLADE SAW RECIP 87.9 MT (BLADE) IMPLANT
CANISTER SUCTION 2500CC (MISCELLANEOUS) ×2 IMPLANT
CLOTH BEACON ORANGE TIMEOUT ST (SAFETY) ×2 IMPLANT
COVER SURGICAL LIGHT HANDLE (MISCELLANEOUS) ×2 IMPLANT
DRAPE EXTREMITY T 121X128X90 (DRAPE) ×2 IMPLANT
ELECT REM PT RETURN 9FT ADLT (ELECTROSURGICAL) ×2
ELECTRODE REM PT RTRN 9FT ADLT (ELECTROSURGICAL) ×1 IMPLANT
GLOVE BIOGEL PI IND STRL 7.0 (GLOVE) ×2 IMPLANT
GLOVE BIOGEL PI IND STRL 7.5 (GLOVE) ×1 IMPLANT
GLOVE BIOGEL PI INDICATOR 7.0 (GLOVE) ×2
GLOVE BIOGEL PI INDICATOR 7.5 (GLOVE) ×1
GLOVE ECLIPSE 6.5 STRL STRAW (GLOVE) ×2 IMPLANT
GLOVE SURG SS PI 7.5 STRL IVOR (GLOVE) ×2 IMPLANT
GOWN PREVENTION PLUS XXLARGE (GOWN DISPOSABLE) ×2 IMPLANT
GOWN STRL NON-REIN LRG LVL3 (GOWN DISPOSABLE) ×4 IMPLANT
KIT BASIN OR (CUSTOM PROCEDURE TRAY) ×2 IMPLANT
KIT ROOM TURNOVER OR (KITS) ×2 IMPLANT
LIDOCAINE 1% PLAIN IMPLANT
NEEDLE HYPO 25GX1X1/2 BEV (NEEDLE) ×2 IMPLANT
NS IRRIG 1000ML POUR BTL (IV SOLUTION) ×2 IMPLANT
PACK GENERAL/GYN (CUSTOM PROCEDURE TRAY) ×2 IMPLANT
PAD ARMBOARD 7.5X6 YLW CONV (MISCELLANEOUS) ×4 IMPLANT
SPECIMEN JAR SMALL (MISCELLANEOUS) IMPLANT
SPONGE GAUZE 4X4 12PLY (GAUZE/BANDAGES/DRESSINGS) ×2 IMPLANT
SUT ETHILON 3 0 PS 1 (SUTURE) ×6 IMPLANT
SYR CONTROL 10ML LL (SYRINGE) ×2 IMPLANT
TOWEL OR 17X24 6PK STRL BLUE (TOWEL DISPOSABLE) ×2 IMPLANT
TOWEL OR 17X26 10 PK STRL BLUE (TOWEL DISPOSABLE) ×2 IMPLANT
TUBE ANAEROBIC SPECIMEN COL (MISCELLANEOUS) IMPLANT
UNDERPAD 30X30 INCONTINENT (UNDERPADS AND DIAPERS) ×2 IMPLANT
WATER STERILE IRR 1000ML POUR (IV SOLUTION) ×2 IMPLANT

## 2012-10-03 NOTE — Progress Notes (Signed)
Utilization review completed. Shriya Aker, RN, BSN. 

## 2012-10-03 NOTE — Interval H&P Note (Signed)
History and Physical Interval Note:  10/03/2012 11:05 AM  Gary [name]  has presented today for surgery, with the diagnosis of Gangrene of left 1st toe  The various methods of treatment have been discussed with the patient and family. After consideration of risks, benefits and other options for treatment, the patient has consented to  Procedure(s) with comments: AMPUTATION DIGIT (Left) - Amputation of left 1st toe as a surgical intervention .  The patient's history has been reviewed, patient examined, no change in status, stable for surgery.  I have reviewed the patient's chart and labs.  Questions were answered to the patient's satisfaction.     Sherena Machorro IV, V. WELLS

## 2012-10-03 NOTE — Anesthesia Postprocedure Evaluation (Signed)
  Anesthesia Post-op Note  Patient: Gary [name]  Procedure(s) Performed: Procedure(s) with comments: AMPUTATION DIGIT (Left) - Amputation of left Great  toe  Patient Location: PACU  Anesthesia Type:General  Level of Consciousness: awake  Airway and Oxygen Therapy: Patient Spontanous Breathing  Post-op Pain: none  Post-op Assessment: Post-op Vital signs reviewed  Post-op Vital Signs: stable  Complications: No apparent anesthesia complications

## 2012-10-03 NOTE — Transfer of Care (Signed)
Immediate Anesthesia Transfer of Care Note  Patient: Gary [name]  Procedure(s) Performed: Procedure(s) with comments: AMPUTATION DIGIT (Left) - Amputation of left Great  toe  Patient Location: PACU  Anesthesia Type:General  Level of Consciousness: awake, alert  and oriented  Airway & Oxygen Therapy: Patient Spontanous Breathing and Patient connected to nasal cannula oxygen  Post-op Assessment: Report given to PACU RN and Post -op Vital signs reviewed and stable  Post vital signs: Reviewed and stable  Complications: No apparent anesthesia complications

## 2012-10-03 NOTE — Anesthesia Preprocedure Evaluation (Signed)
Anesthesia Evaluation  Patient identified by MRN, date of birth, ID band Patient awake    Reviewed: Allergy & Precautions, H&P , Patient's Chart, lab work & pertinent test results  History of Anesthesia Complications Negative for: history of anesthetic complications  Airway Mallampati: I  Neck ROM: Full    Dental  (+) Poor Dentition   Pulmonary  breath sounds clear to auscultation        Cardiovascular hypertension, + Peripheral Vascular Disease Rate:Normal     Neuro/Psych    GI/Hepatic GERD-  ,  Endo/Other  diabetes  Renal/GU      Musculoskeletal   Abdominal   Peds  Hematology  (+) Blood dyscrasia, ,   Anesthesia Other Findings   Reproductive/Obstetrics                           Anesthesia Physical Anesthesia Plan  ASA: III  Anesthesia Plan: General   Post-op Pain Management:    Induction: Intravenous  Airway Management Planned: LMA  Additional Equipment: Arterial line  Intra-op Plan:   Post-operative Plan: Extubation in OR  Informed Consent: I have reviewed the patients History and Physical, chart, labs and discussed the procedure including the risks, benefits and alternatives for the proposed anesthesia with the patient or authorized representative who has indicated his/her understanding and acceptance.   Dental advisory given  Plan Discussed with: CRNA and Surgeon  Anesthesia Plan Comments:         Anesthesia Quick Evaluation
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
## 2012-10-03 NOTE — Anesthesia Procedure Notes (Signed)
Procedure Name: LMA Insertion Date/Time: 10/03/2012 11:08 AM Performed by: Mariea Clonts Pre-anesthesia Checklist: Patient identified, Emergency Drugs available, Suction available and Patient being monitored Patient Re-evaluated:Patient Re-evaluated prior to inductionOxygen Delivery Method: Circle system utilized Preoxygenation: Pre-oxygenation with 100% oxygen Intubation Type: IV induction LMA Size: 4.0 Placement Confirmation: positive ETCO2 and breath sounds checked- equal and bilateral Tube secured with: Tape Dental Injury: Teeth and Oropharynx as per pre-operative assessment
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
## 2012-10-03 NOTE — Preoperative (Signed)
Beta Blockers   Reason not to administer Beta Blockers:Not Applicable 

## 2012-10-04 ENCOUNTER — Encounter (HOSPITAL_COMMUNITY): Payer: Self-pay | Admitting: Surgery

## 2012-10-04 ENCOUNTER — Telehealth: Payer: Self-pay | Admitting: Surgery

## 2012-10-04 DIAGNOSIS — S98119A Complete traumatic amputation of unspecified great toe, initial encounter: Secondary | ICD-10-CM

## 2012-10-04 DIAGNOSIS — L98499 Non-pressure chronic ulcer of skin of other sites with unspecified severity: Secondary | ICD-10-CM

## 2012-10-04 DIAGNOSIS — I739 Peripheral vascular disease, unspecified: Secondary | ICD-10-CM

## 2012-10-04 LAB — GLUCOSE, CAPILLARY: Glucose-Capillary: 79 mg/dL (ref 70–99)

## 2012-10-04 LAB — BASIC METABOLIC PANEL
Calcium: 9 mg/dL (ref 8.4–10.5)
GFR calc Af Amer: >90 mL/min (ref >90)
GFR calc non Af Amer: >90 mL/min (ref >90)
Potassium: 4.4 mEq/L (ref 3.5–5.1)
Sodium: 140 mEq/L (ref 135–145)

## 2012-10-04 LAB — CBC
Hemoglobin: 13.4 g/dL (ref 13.0–17.0)
MCH: 25.2 pg — ABNORMAL LOW (ref 26.0–34.0)
Platelets: 252 10*3/uL (ref 150–400)
RBC: 5.32 MIL/uL (ref 4.22–5.81)
WBC: 11.1 10*3/uL — ABNORMAL HIGH (ref 4.0–10.5)

## 2012-10-04 MED ORDER — OXYCODONE-ACETAMINOPHEN 5-325 MG PO TABS: 1.0000 | ORAL_TABLET | ORAL | Status: DC | PRN

## 2012-10-04 NOTE — Telephone Encounter (Addendum)
Message copied by Gena Fray on Fri Oct 04, 2012 12:49 PM ------      Message from: [name]      Created: Fri Oct 04, 2012 11:40 AM                   ----- Message -----         From: Ulyses Amor, PA-C         Sent: 10/04/2012   9:08 AM           To: Alfonso Patten, RN            F/U Mon 16th of June with Dr. Trula Slade for suture removal and wound check.  S/P left great toe amputation. ------  Left message for patient, dpm

## 2012-10-04 NOTE — Evaluation (Signed)
Physical Therapy Evaluation Patient Details Name: Gary [name] MRN: [id number] DOB: 12-06-1955 Today's Date: 10/04/2012 Time: [phone] (was out of room for about 5-7 minutes to locate crutches) PT Time Calculation (min): 27 min  PT Assessment / Plan / Recommendation Clinical Impression   Pt is s/p toe amputation on LLE and must wear Darco shoe.  Pt was independent at home PTA using cane and RLE prosthetic.  Discussed with pt that he should primarily should have LLE elevated and to greatly limit time spent up and with LLE in dependent position.  Pt independently able to remove Darco shoe and independently able to don/dof prosthetic. Answered all pts questions.  Pt feels ready for DC home and states he will use crutches at home for safety. No family present for eval.        PT Assessment  Patent does not need any further PT services    Follow Up Recommendations  No PT follow up    Does the patient have the potential to tolerate intense rehabilitation      Barriers to Discharge        Equipment Recommendations  None recommended by PT    Recommendations for Other Services     Frequency      Precautions / Restrictions Precautions Precautions: Fall Required Braces or Orthoses: Other Brace/Splint (Darco shoe) Restrictions Weight Bearing Restrictions: Yes LUE Weight Bearing: Partial weight bearing LLE Weight Bearing:  (Weight bear through heel on LLE) LLE Partial Weight Bearing Percentage or Pounds: weight bear through heel only   Pertinent Vitals/Pain 6/10 pain in left foot      Mobility  Bed Mobility Bed Mobility: Supine to Sit;Sit to Supine Supine to Sit: 7: Independent Sit to Supine: 7: Independent Transfers Sit to Stand: 6: Modified independent (Device/Increase time);From bed (with prosthetic) Stand to Sit: 6: Modified independent (Device/Increase time);To bed (with prosthetic) Details for Transfer Assistance: safe technique Ambulation/Gait Ambulation/Gait  Assistance: 5: Supervision Ambulation Distance (Feet): 20 Feet (ambulated 20 feet twice) Assistive device: Crutches (pt held to IV pole on first walk) Ambulation/Gait Assistance Details: pt much steadier with gait using crutches Gait Pattern: Step-through pattern;Decreased stride length Gait velocity: decreased Stairs: No (Verbally discussed. Pt did not want to practice.  ) Discussed with pt to only do stairs with assistance.     Exercises     PT Diagnosis:    PT Problem List:   PT Treatment Interventions:     PT Goals    Visit Information  Last PT Received On: 10/04/12 Assistance Needed: +1    Subjective Data  Patient Stated Goal: to go home today   Prior Functioning  Home Living Lives With: Spouse Available Help at Discharge: Available PRN/intermittently Type of Home: House Home Access: Stairs to enter [employer] of Steps: 3 Entrance Stairs-Rails: Right Home Layout: One level Bathroom Shower/Tub: Tub/shower unit;Door [employer] Toilet: Standard Bathroom Accessibility: Yes How Accessible: Accessible via walker Home Adaptive Equipment: Tub transfer bench;Crutches;Straight cane;Wheelchair - manual Prior Function Level of Independence: Independent with assistive device(s) (uses prosthetic on RLE) Able to Take Stairs?: Yes Communication Communication: No difficulties    Cognition  Cognition Arousal/Alertness: Awake/alert Behavior During Therapy: WFL for tasks assessed/performed Overall Cognitive Status: Within Functional Limits for tasks assessed    Extremity/Trunk Assessment Right Upper Extremity Assessment RUE ROM/Strength/Tone: [facility] for tasks assessed Left Upper Extremity Assessment LUE ROM/Strength/Tone: WFL for tasks assessed Right Lower Extremity Assessment RLE ROM/Strength/Tone: Deficits RLE ROM/Strength/Tone Deficits: R BKA Left Lower Extremity Assessment LLE ROM/Strength/Tone: Deficits;Due to  pain;Due to precautions LLE ROM/Strength/Tone  Deficits: toe amputationa Trunk Assessment Trunk Assessment: Normal   Balance Balance Balance Assessed: Yes Dynamic Standing Balance Dynamic Standing - Balance Support: No upper extremity supported Dynamic Standing - Level of Assistance: 6: Modified independent (Device/Increase time) Dynamic Standing - Balance Activities: Lateral lean/weight shifting;Forward lean/weight shifting  End of Session PT - End of Session Equipment Utilized During Treatment: Gait belt;Right lower extremity prosthesis;Other (comment) (Darco shoe on LLE) Activity Tolerance: Patient tolerated treatment well Patient left: in bed;with call bell/phone within reach  GP     Melvern Banker 10/04/2012, 1:46 PM Lavonia Dana, [place] 10/04/2012

## 2012-10-04 NOTE — Progress Notes (Signed)
Rehab admissions - Awaiting PT and OT evaluations to determine next care venue.  I will follow up once PT/OT evals and recommendations are completed.  Call me for questions.  [phone]

## 2012-10-04 NOTE — Progress Notes (Signed)
D/c instructions along with med list and script provided. IV d/c'd with catheter intact. Patient able to transfer to personal wheelchair and change clothes independently. D/c summary faxed to [facility] for [facility] care. Patient transported via personal wheelchair per Engineer, manufacturing. Belongings with patient. Cindee Salt

## 2012-10-04 NOTE — Progress Notes (Addendum)
[facility] [place]  Postoperative Visit - Amputation  Date of Surgery: 10/03/2012 Procedure(s): AMPUTATION DIGIT Left Surgeon: Surgeon(s): Serafina Mitchell, MD POD: 1 Day Post-Op  Subjective Gary [name] is a 57 y.o. male who is S/P Left Procedure(s): AMPUTATION DIGIT.  Pt.denies increased pain in the stump. The patient notes pain is well controlled. Pt. denies phantom pain.  Significant Diagnostic Studies: CBC Lab Results  Component Value Date   WBC 11.1* 10/04/2012   HGB 13.4 10/04/2012   HCT 40.2 10/04/2012   MCV 75.6* 10/04/2012   PLT 252 10/04/2012    BMET    Component Value Date/Time   NA 140 10/04/2012 0500   K 4.4 10/04/2012 0500   CL 104 10/04/2012 0500   CO2 30 10/04/2012 0500   GLUCOSE 88 10/04/2012 0500   BUN 15 10/04/2012 0500   CREATININE 0.87 10/04/2012 0500   CREATININE 0.86 09/26/2012 1536   CALCIUM 9.0 10/04/2012 0500   GFRNONAA >90 10/04/2012 0500   GFRAA >90 10/04/2012 0500    COAG Lab Results  Component Value Date   INR 1.0 10/15/2008   INR 0.9 10/15/2008   No results found for this basename: PTT     Intake/Output Summary (Last 24 hours) at 10/04/12 0901 Last data filed at 10/04/12 0700  Gross per 24 hour  Intake   1330 ml  Output    825 ml  Net    505 ml   No data found.    Physical Examination  BP Readings from Last 3 Encounters:  10/04/12 132/79  10/04/12 132/79  10/01/12 176/89   Temp Readings from Last 3 Encounters:  10/04/12 98.4 F (36.9 C) Oral  10/04/12 98.4 F (36.9 C) Oral  09/26/12 98.5 F (36.9 C) Oral   SpO2 Readings from Last 3 Encounters:  10/04/12 96%  10/04/12 96%  07/27/11 95%   Pulse Readings from Last 3 Encounters:  10/04/12 71  10/04/12 71  10/01/12 81    Pt is A&Ox3  WDWN male with no complaints  Left amputation wound is clean, dry and no drainage dressing There is good bone coverage in the stump Stump is warm and well perfused, without drainage; without  erythema   Assessment/plan:  Gary [name] is a 58 y.o. male who is s/p Left Procedure(s): AMPUTATION DIGIT  The patient's stump is viable.  Follow-up 4 weeks from surgery  Darco shoe heel weight bearing for transfers.  COLLINS, EMMA MAUREEN 9:01 AM 10/04/2012   I agree with the above. The patient is stable for discharge. He'll followup in 2 weeks.   Annamarie Major

## 2012-10-04 NOTE — Discharge Summary (Signed)
I agree with the above  Gary [name] 
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
## 2012-10-04 NOTE — Consult Note (Signed)
Physical Medicine and Rehabilitation Consult Reason for Consult: Left great toe amputation/history right below-knee amputation Referring Physician: Dr. Trula Slade   HPI: Gary [name] is a 57 y.o. right-handed male with history of diabetes mellitus peripheral neuropathy. 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 Patient lives with his wife who works and patient attends adult daycare during the day. Admitted 10/03/2012 with gangrenous left first toe and progressive ischemic changes and underlying osteomyelitis identified by x-ray and imaging. Underwent left great toe amputation 10/03/2012 per Dr. Trula Slade. Postoperative pain management. Subcutaneous Lovenox for DVT prophylaxis. Physical occupational therapy evaluations pending. M.D. has requested physical medicine rehabilitation consult to consider inpatient rehabilitation services.   Review of Systems  HENT: Positive for hearing loss.   Eyes: Positive for blurred vision.  Gastrointestinal: Positive for constipation.       GERD  Musculoskeletal: Positive for myalgias.  All other systems reviewed and are negative.   Past Medical History  Diagnosis Date  . Hypertension   . Diabetes mellitus   . High cholesterol   . Blurred vision   . Insomnia   . Peripheral artery disease   . GERD (gastroesophageal reflux disease)   . Constipation   . Sensorineural hearing loss     left ear  . Port-wine stain of face     Left V1 distribution, including upper eyelid  . Vitamin D deficiency    Past Surgical History  Procedure Laterality Date  . Amputation Right 07/17/2011  . Peripheral arterial stent graft N/A 2010   Family History  Problem Relation Age of Onset  . Diabetes Mother   . Cancer Father    Social History:  reports that he has been smoking Cigarettes.  He has a .12 pack-year smoking history. He has never used smokeless tobacco. He reports that he does not drink alcohol  or use illicit drugs. Allergies: No Known Allergies Medications Prior to Admission  Medication Sig Dispense Refill  . aspirin EC 81 MG tablet Take 81 mg by mouth daily.      . calcium carbonate (TUMS EX) 750 MG chewable tablet Chew 2 tablets by mouth every 4 (four) hours as needed for heartburn.      . Cholecalciferol (VITAMIN D-3) 1000 UNITS CAPS Take 1,000 Units by mouth daily.      . collagenase (SANTYL) ointment Apply 1 application topically daily. 1 app applied topically daily.  Will be done in the clinic on assigned center days otherwise participant will change the dressing at home.      . docusate sodium (COLACE) 100 MG capsule Take 100 mg by mouth at bedtime as needed for constipation.      Marland Kitchen glimepiride (AMARYL) 1 MG tablet Take 1 mg by mouth daily.      . insulin glargine (LANTUS) 100 UNIT/ML injection Inject 40 Units into the skin at bedtime.       Marland Kitchen lisinopril (PRINIVIL,ZESTRIL) 5 MG tablet Take 10 mg by mouth daily.       . metFORMIN (GLUCOPHAGE) 500 MG tablet Take 1,000 mg by mouth 2 (two) times daily with a meal.       . omeprazole (PRILOSEC) 20 MG capsule Take 20 mg by mouth daily.      . rosuvastatin (CRESTOR) 20 MG tablet Take 20 mg by mouth daily.      . varenicline (CHANTIX) 1 MG tablet Take 1 mg by mouth 2 (two) times daily.      Marland Kitchen zolpidem (AMBIEN) 5  MG tablet Take 5 mg by mouth at bedtime as needed for sleep.        Home:    Functional History:   Functional Status:  Mobility:          ADL:    Cognition: Cognition Orientation Level: Oriented X4    Blood pressure 132/79, pulse 71, temperature 98.4 F (36.9 C), temperature source Oral, resp. rate 20, SpO2 96.00%. Physical Exam  Vitals reviewed. Constitutional: He is oriented to person, place, and time.  HENT:  Head: Normocephalic.  Eyes: EOM are normal.  Neck: Neck supple. No thyromegaly present.  Cardiovascular: Normal rate and regular rhythm.   Pulmonary/Chest: Effort normal and breath sounds  normal. No respiratory distress.  Abdominal: Soft. Bowel sounds are normal. He exhibits no distension.  Musculoskeletal:  Left foot moderately tender. He moves it easily  Neurological: He is alert and oriented to person, place, and time.  Patient follows three-step commands. Strength normal. Mild distal sensory loss. hoh.  Skin:  Right below-knee amputation site well-healed. Heavy dressing in place to left foot.   Psychiatric: He has a normal mood and affect. His behavior is normal. Judgment and thought content normal.    Results for orders placed during the hospital encounter of 10/03/12 (from the past 24 hour(s))  POCT I-STAT 4, (NA,K, GLUC, HGB,HCT)     Status: Abnormal   Collection Time    10/03/12  8:30 AM      Result Value Range   Sodium 140  135 - 145 mEq/L   Potassium 4.2  3.5 - 5.1 mEq/L   Glucose, Bld 223 (*) 70 - 99 mg/dL   HCT 47.0  39.0 - 52.0 %   Hemoglobin 16.0  13.0 - 17.0 g/dL  GLUCOSE, CAPILLARY     Status: Abnormal   Collection Time    10/03/12  8:35 AM      Result Value Range   Glucose-Capillary 206 (*) 70 - 99 mg/dL  GLUCOSE, CAPILLARY     Status: Abnormal   Collection Time    10/03/12  9:37 AM      Result Value Range   Glucose-Capillary 195 (*) 70 - 99 mg/dL  SURGICAL PCR SCREEN     Status: None   Collection Time    10/03/12 10:24 AM      Result Value Range   MRSA, PCR NEGATIVE  NEGATIVE   Staphylococcus aureus NEGATIVE  NEGATIVE  GLUCOSE, CAPILLARY     Status: Abnormal   Collection Time    10/03/12 11:39 AM      Result Value Range   Glucose-Capillary 188 (*) 70 - 99 mg/dL   Comment 1 Notify RN    GLUCOSE, CAPILLARY     Status: Abnormal   Collection Time    10/03/12  2:24 PM      Result Value Range   Glucose-Capillary 157 (*) 70 - 99 mg/dL   Comment 1 Documented in Chart     Comment 2 Notify RN    CBC     Status: Abnormal   Collection Time    10/03/12  3:35 PM      Result Value Range   WBC 9.5  4.0 - 10.5 K/uL   RBC 5.24  4.22 - 5.81 MIL/uL    Hemoglobin 13.1  13.0 - 17.0 g/dL   HCT 39.9  39.0 - 52.0 %   MCV 76.1 (*) 78.0 - 100.0 fL   MCH 25.0 (*) 26.0 - 34.0 pg   MCHC 32.8  30.0 - 36.0 g/dL   RDW 13.3  11.5 - 15.5 %   Platelets 235  150 - 400 K/uL  CREATININE, SERUM     Status: None   Collection Time    10/03/12  3:35 PM      Result Value Range   Creatinine, Ser 0.81  0.50 - 1.35 mg/dL   GFR calc non Af Amer >90  >90 mL/min   GFR calc Af Amer >90  >90 mL/min  GLUCOSE, CAPILLARY     Status: Abnormal   Collection Time    10/03/12  4:23 PM      Result Value Range   Glucose-Capillary 147 (*) 70 - 99 mg/dL   Comment 1 Documented in Chart     Comment 2 Notify RN    GLUCOSE, CAPILLARY     Status: Abnormal   Collection Time    10/03/12  9:34 PM      Result Value Range   Glucose-Capillary 202 (*) 70 - 99 mg/dL  BASIC METABOLIC PANEL     Status: None   Collection Time    10/04/12  5:00 AM      Result Value Range   Sodium 140  135 - 145 mEq/L   Potassium 4.4  3.5 - 5.1 mEq/L   Chloride 104  96 - 112 mEq/L   CO2 30  19 - 32 mEq/L   Glucose, Bld 88  70 - 99 mg/dL   BUN 15  6 - 23 mg/dL   Creatinine, Ser 0.87  0.50 - 1.35 mg/dL   Calcium 9.0  8.4 - 10.5 mg/dL   GFR calc non Af Amer >90  >90 mL/min   GFR calc Af Amer >90  >90 mL/min  CBC     Status: Abnormal   Collection Time    10/04/12  5:00 AM      Result Value Range   WBC 11.1 (*) 4.0 - 10.5 K/uL   RBC 5.32  4.22 - 5.81 MIL/uL   Hemoglobin 13.4  13.0 - 17.0 g/dL   HCT 40.2  39.0 - 52.0 %   MCV 75.6 (*) 78.0 - 100.0 fL   MCH 25.2 (*) 26.0 - 34.0 pg   MCHC 33.3  30.0 - 36.0 g/dL   RDW 13.2  11.5 - 15.5 %   Platelets 252  150 - 400 K/uL   No results found.  Assessment/Plan: Diagnosis: left Great toe amp, prior right BKA with prosthesis 1. Does the need for close, 24 hr/day medical supervision in concert with the patient's rehab needs make it unreasonable for this patient to be served in a less intensive setting? Potentially 2. Co-Morbidities requiring  supervision/potential complications: dm, wound care, htn 3. Due to bladder management, bowel management, safety, skin/wound care, disease management, medication administration, pain management and patient education, does the patient require 24 hr/day rehab nursing? No and Potentially 4. Does the patient require coordinated care of a physician, rehab nurse, PT, OT to address physical and functional deficits in the context of the above medical diagnosis(es)? No Addressing deficits in the following areas: balance, endurance, strength, transferring and bathing 5. Can the patient actively participate in an intensive therapy program of at least 3 hrs of therapy per day at least 5 days per week? Potentially 6. The potential for patient to make measurable gains while on inpatient rehab is fair 7. Anticipated functional outcomes upon discharge from inpatient rehab are ?mod I with PT, ?mod I with OT, n/a with SLP. 8. Estimated rehab  length of stay to reach the above functional goals is: less than 7 days if needed at all. 9. Does the patient have adequate social supports to accommodate these discharge functional goals? Yes 10. Anticipated D/C setting: Home 11. Anticipated post D/C treatments: [place] therapy 12. Overall Rehab/Functional Prognosis: excellent  RECOMMENDATIONS: This patient's condition is appropriate for continued rehabilitative care in the following setting: [facility] Patient has agreed to participate in recommended program. Yes Note that insurance prior authorization may be required for reimbursement for recommended care.  Comment: Likely will not be able to justify CIR admit based on lack of medical necessity and the likelihood that he will perform well with acute therapies. Rehab RN to follow up.   Meredith Staggers, MD, [facility] Drown     10/04/2012

## 2012-10-04 NOTE — Progress Notes (Signed)
Occupational Therapy Evaluation Patient Details Name: Gary [name] MRN: [id number] DOB: 12-18-1955 Today's Date: 10/04/2012 Time: 1205-1219 OT Time Calculation (min): 14 min  OT Assessment / Plan / Recommendation Clinical Impression  Pt s/p remote R BKA. Underwent great toe amputation. Educated pt on WBS, precautions and home safety. Stressed importance of using prosthestic leg at all times to avoid hopping on L foot and risking postop complications. Pt verbalized understanding. Pt has all nec DME and will be able to D/C home. No OT follow up needed. Thank you.    OT Assessment  Patient does not need any further OT services    Follow Up Recommendations  No OT follow up    Barriers to Discharge  none    Equipment Recommendations  None recommended by OT    Recommendations for Other Services    Frequency       Precautions / Restrictions Restrictions Weight Bearing Restrictions: Yes LUE Weight Bearing: Partial weight bearing LLE Weight Bearing: Partial weight bearing LLE Partial Weight Bearing Percentage or Pounds: weight bear through heel only   Pertinent Vitals/Pain no apparent distress     ADL  Grooming: Modified independent Where Assessed - Grooming: Supported standing Upper Body Bathing: Modified independent Where Assessed - Upper Body Bathing: Unsupported standing Lower Body Bathing: Supervision/safety;Set up Where Assessed - Lower Body Bathing: Unsupported sit to stand Upper Body Dressing: Modified independent Where Assessed - Upper Body Dressing: Unsupported sitting Lower Body Dressing: Supervision/safety;Set up Where Assessed - Lower Body Dressing: Unsupported sit to stand Toilet Transfer: Supervision/safety;Set up Toilet Transfer Method: Sit to stand Toilet Transfer Equipment: Comfort height toilet Toileting - Clothing Manipulation and Hygiene: Modified independent Where Assessed - Toileting Clothing Manipulation and Hygiene: Sit to stand from 3-in-1 or  toilet Equipment Used: Gait belt Transfers/Ambulation Related to ADLs: mod I ADL Comments: Educated pt on home safety and tub transfer technique. Pt has tub bench but has been transferring without his prosthetic. Educated pt to use prosthetic for transfers and ambulation until foot heels. Also rec for pt to remove sliding doors and to install grab bar to reduce risk of fals. Pt verbalized understanding.    OT Diagnosis:    OT Problem List:   OT Treatment Interventions:     OT Goals Acute Rehab OT Goals OT Goal Formulation:  (eval only)  Visit Information  Last OT Received On: 10/04/12 Assistance Needed: +1    Subjective Data      Prior Functioning     Home Living Lives With: Spouse Available Help at Discharge: Available PRN/intermittently Type of Home: House Home Access: Stairs to enter [employer] of Steps: 3 Home Layout: One level Bathroom Shower/Tub: Tub/shower unit;Door [employer] Toilet: Programmer, systems: Yes How Accessible: Accessible via walker Home Adaptive Equipment: Tub transfer bench;Crutches;Straight cane Prior Function Level of Independence: Independent with assistive device(s) Able to Take Stairs?: Yes Communication Communication: No difficulties         Vision/Perception Vision - History Baseline Vision: No visual deficits   Cognition  Cognition Arousal/Alertness: Awake/alert Behavior During Therapy: WFL for tasks assessed/performed Overall Cognitive Status: Within Functional Limits for tasks assessed    Extremity/Trunk Assessment Right Upper Extremity Assessment RUE ROM/Strength/Tone: [facility] for tasks assessed Left Upper Extremity Assessment LUE ROM/Strength/Tone: WFL for tasks assessed Right Lower Extremity Assessment RLE ROM/Strength/Tone: Deficits (BKA but functional with prosthetic) Left Lower Extremity Assessment LLE ROM/Strength/Tone: Deficits;Due to pain;Due to precautions LLE ROM/Strength/Tone Deficits: toe  amputationa Trunk Assessment Trunk Assessment: Normal     Mobility  Bed Mobility Bed Mobility: Supine to Sit;Sit to Supine Supine to Sit: 7: Independent Sit to Supine: 7: Independent Transfers Transfers: Sit to Stand;Stand to Sit Sit to Stand: 6: Modified independent (Device/Increase time);From bed Stand to Sit: 6: Modified independent (Device/Increase time);To bed;With upper extremity assist Details for Transfer Assistance: safe technique     Exercise     Balance  WFL for ADL with AD   End of Session OT - End of Session Equipment Utilized During Treatment: Gait belt;Other (comment) (Darco shoe) Activity Tolerance: Patient tolerated treatment well Patient left: in bed;with call bell/phone within reach;Other (comment) (with PT) Nurse Communication: Mobility status  GO     Jag Lenz,HILLARY 10/04/2012, 12:37 PM [facility], OTR/L  [phone] 10/04/2012

## 2012-10-04 NOTE — Progress Notes (Signed)
Orthopedic Tech Progress Note Patient Details:  Gary [name] 1956/03/02 [id number]  Ortho Devices Type of Ortho Device: Darco shoe Ortho Device/Splint Interventions: Application   Irish Elders 10/04/2012, 10:36 AM

## 2012-10-07 ENCOUNTER — Telehealth: Payer: Self-pay | Admitting: *Deleted

## 2012-10-07 ENCOUNTER — Encounter: Payer: PRIVATE HEALTH INSURANCE | Admitting: Surgery

## 2012-10-07 NOTE — Telephone Encounter (Signed)
Kennyth Lose from [place] called to verify Santyl orders given to patient at Discharge by our PA, Gwenette Greet. This patient is not being seen at the wound clinic but goes to [facility] and they will be assisting in his dressing changes as ordered. Patient is afebrile and the wound looks good; 1 area medially that has some fat showing but wound is clean. She will instructed patient on wound care for the days that he does not go to [facility]. They will call us if any infections or fever occur.

## 2012-10-07 NOTE — Telephone Encounter (Signed)
Patient will call our office if any questions regarding treatment plan

## 2012-10-09 ENCOUNTER — Encounter: Payer: Self-pay | Admitting: Infectious Disease

## 2012-10-11 ENCOUNTER — Ambulatory Visit: Payer: PRIVATE HEALTH INSURANCE | Admitting: Vascular Surgery

## 2012-10-11 ENCOUNTER — Encounter: Payer: Self-pay | Admitting: Vascular Surgery

## 2012-10-11 ENCOUNTER — Ambulatory Visit (INDEPENDENT_AMBULATORY_CARE_PROVIDER_SITE_OTHER): Payer: PRIVATE HEALTH INSURANCE | Admitting: Vascular Surgery

## 2012-10-11 VITALS — BP 159/93 | HR 73 | Temp 98.0°F | Ht 66.0 in | Wt 155.1 lb

## 2012-10-11 DIAGNOSIS — I739 Peripheral vascular disease, unspecified: Secondary | ICD-10-CM

## 2012-10-11 DIAGNOSIS — L98499 Non-pressure chronic ulcer of skin of other sites with unspecified severity: Secondary | ICD-10-CM

## 2012-10-11 NOTE — Progress Notes (Signed)
[facility] [place]  Postoperative Visit  History of Present Illness  Gary [name] is a 57 y.o. year old male who presents for postoperative follow-up for: L great toe amputation by Dr. Trula Slade (Date: 10/03/12).  The patient's toe wound protruded fat today.  The patient's pain is well controlled with pain rx.  He denies any fever or chills.  Physical Examination Filed Vitals:   10/11/12 1551  BP: 159/93  Pulse: 73  Temp: 98 F (36.7 C)    L great toe incision: some superficial incision dehiscence with protrusion of underlying subcutaneous fat, minimal serous drainage without bleeding, no signs of cellulitis  Medical Decision Making  Gary [name] is a 57 y.o. year old male who presents s/p L great toe amputation.  Wet-to-dry dressing daily to L great toe.  Home health is being arranged for this.  Patient already has follow up with Dr. Charletta Cousin, MD [facility]: [phone] Pager: [phone]  10/11/2012, 4:04 PM

## 2012-10-18 ENCOUNTER — Encounter: Payer: Self-pay | Admitting: Surgery

## 2012-10-21 ENCOUNTER — Ambulatory Visit (INDEPENDENT_AMBULATORY_CARE_PROVIDER_SITE_OTHER): Payer: PRIVATE HEALTH INSURANCE | Admitting: Surgery

## 2012-10-21 ENCOUNTER — Encounter: Payer: Self-pay | Admitting: Surgery

## 2012-10-21 VITALS — BP 134/78 | HR 72 | Temp 98.3°F | Ht 66.0 in | Wt 154.0 lb

## 2012-10-21 DIAGNOSIS — I739 Peripheral vascular disease, unspecified: Secondary | ICD-10-CM

## 2012-10-21 NOTE — Progress Notes (Signed)
Status post left great toe amputation on 10/03/2012 secondary to a nonhealing wound. He has a history of aortobifemoral bypass graft.  Amputation site is healing nicely. Nylon sutures were removed today. There is a slight opening had the point where the skin comes together at the distal tip. I placed one gel on this today and a sterile dressing.  I told the patient to place triple antibiotic ointment on the wound daily. He will come back in one month for followup.  Gary [name]

## 2012-10-25 ENCOUNTER — Telehealth: Payer: Self-pay | Admitting: Neurology

## 2012-10-25 DIAGNOSIS — R9089 Other abnormal findings on diagnostic imaging of central nervous system: Secondary | ICD-10-CM

## 2012-10-25 NOTE — Telephone Encounter (Signed)
Message copied by Margette Fast on Fri Oct 25, 2012 11:54 AM ------      Message from: Margette Fast      Created: Thu Jul 25, 2012  8:45 PM       Repeat MRI brain with and without.  ------

## 2012-10-25 NOTE — Telephone Encounter (Signed)
I called the patient. We will recheck MRI of the brain to compare to the prior study done. I will get this set up. The patient needs approval through [facility].

## 2012-11-07 ENCOUNTER — Ambulatory Visit: Payer: PRIVATE HEALTH INSURANCE | Admitting: Infectious Disease

## 2012-11-22 ENCOUNTER — Encounter: Payer: Self-pay | Admitting: Surgery

## 2012-11-25 ENCOUNTER — Encounter: Payer: Self-pay | Admitting: *Deleted

## 2012-11-25 ENCOUNTER — Ambulatory Visit (INDEPENDENT_AMBULATORY_CARE_PROVIDER_SITE_OTHER): Payer: PRIVATE HEALTH INSURANCE | Admitting: Surgery

## 2012-11-25 ENCOUNTER — Encounter: Payer: Self-pay | Admitting: Surgery

## 2012-11-25 VITALS — BP 152/78 | HR 70 | Temp 97.8°F | Resp 14 | Ht 66.0 in | Wt 157.0 lb

## 2012-11-25 DIAGNOSIS — Z09 Encounter for follow-up examination after completed treatment for conditions other than malignant neoplasm: Secondary | ICD-10-CM | POA: Insufficient documentation

## 2012-11-25 DIAGNOSIS — I7025 Atherosclerosis of native arteries of other extremities with ulceration: Secondary | ICD-10-CM | POA: Insufficient documentation

## 2012-11-25 DIAGNOSIS — L98499 Non-pressure chronic ulcer of skin of other sites with unspecified severity: Secondary | ICD-10-CM

## 2012-11-25 DIAGNOSIS — I739 Peripheral vascular disease, unspecified: Secondary | ICD-10-CM

## 2012-11-25 NOTE — Progress Notes (Signed)
The patient is status post left great toe amputation on 10/03/2012. He has a history of an aortobifemoral bypass graft in 2010. He stepped on a nail while in [place] and this got infected. This was the reason for his toe amputation. He has a history of a right leg amputation. He did require wet-to-dry dressing changes temporarily. His wound is now completely healed. I have scheduled him to come back in one year for repeat ABIs to evaluate his aortobifemoral bypass graft.

## 2012-11-26 NOTE — Addendum Note (Signed)
Addended by: Mena Goes on: 11/26/2012 09:08 AM   Modules accepted: Orders

## 2012-12-19 ENCOUNTER — Other Ambulatory Visit: Payer: Self-pay | Admitting: Family Medicine

## 2012-12-19 DIAGNOSIS — Q858 Other phakomatoses, not elsewhere classified: Secondary | ICD-10-CM

## 2012-12-27 ENCOUNTER — Ambulatory Visit
Admission: RE | Admit: 2012-12-27 | Discharge: 2012-12-27 | Disposition: A | Discharge status: Home / Self Care | Payer: PRIVATE HEALTH INSURANCE | Source: Ambulatory Visit | Attending: Family Medicine | Admitting: Family Medicine

## 2012-12-27 DIAGNOSIS — Q858 Other phakomatoses, not elsewhere classified: Secondary | ICD-10-CM

## 2012-12-27 MED ORDER — GADOBENATE DIMEGLUMINE 529 MG/ML IV SOLN
15.0000 mL | Freq: Once | INTRAVENOUS | Status: AC | PRN
  Administered 2012-12-27: 15 mL via INTRAVENOUS

## 2013-06-17 ENCOUNTER — Other Ambulatory Visit: Payer: Self-pay | Admitting: Surgery

## 2013-06-17 DIAGNOSIS — I70219 Atherosclerosis of native arteries of extremities with intermittent claudication, unspecified extremity: Secondary | ICD-10-CM

## 2013-06-17 DIAGNOSIS — Z48812 Encounter for surgical aftercare following surgery on the circulatory system: Secondary | ICD-10-CM

## 2013-11-28 ENCOUNTER — Encounter: Payer: Self-pay | Admitting: Family

## 2013-12-01 ENCOUNTER — Ambulatory Visit (INDEPENDENT_AMBULATORY_CARE_PROVIDER_SITE_OTHER): Payer: Medicare (Managed Care) | Admitting: Family

## 2013-12-01 ENCOUNTER — Encounter: Payer: Self-pay | Admitting: Family

## 2013-12-01 ENCOUNTER — Ambulatory Visit: Payer: PRIVATE HEALTH INSURANCE | Admitting: Surgery

## 2013-12-01 ENCOUNTER — Ambulatory Visit (HOSPITAL_COMMUNITY)
Admission: RE | Admit: 2013-12-01 | Discharge: 2013-12-01 | Disposition: A | Discharge status: Home / Self Care | Payer: Medicare (Managed Care) | Source: Ambulatory Visit | Attending: Family | Admitting: Family

## 2013-12-01 ENCOUNTER — Encounter (HOSPITAL_COMMUNITY): Payer: PRIVATE HEALTH INSURANCE

## 2013-12-01 VITALS — BP 158/79 | HR 59 | Resp 16 | Ht 66.0 in | Wt 173.0 lb

## 2013-12-01 DIAGNOSIS — R0989 Other specified symptoms and signs involving the circulatory and respiratory systems: Secondary | ICD-10-CM | POA: Diagnosis not present

## 2013-12-01 DIAGNOSIS — I70219 Atherosclerosis of native arteries of extremities with intermittent claudication, unspecified extremity: Secondary | ICD-10-CM

## 2013-12-01 DIAGNOSIS — Z48812 Encounter for surgical aftercare following surgery on the circulatory system: Secondary | ICD-10-CM

## 2013-12-01 DIAGNOSIS — I739 Peripheral vascular disease, unspecified: Secondary | ICD-10-CM | POA: Diagnosis not present

## 2013-12-01 NOTE — Patient Instructions (Addendum)
Peripheral Vascular Disease Peripheral Vascular Disease (PVD), also called Peripheral Arterial Disease (PAD), is a circulation problem caused by cholesterol (atherosclerotic plaque) deposits in the arteries. PVD commonly occurs in the lower extremities (legs) but it can occur in other areas of the body, such as your arms. The cholesterol buildup in the arteries reduces blood flow which can cause pain and other serious problems. The presence of PVD can place a person at risk for Coronary Artery Disease (CAD).  CAUSES  Causes of PVD can be many. It is usually associated with more than one risk factor such as:   High Cholesterol.  Smoking.  Diabetes.  Lack of exercise or inactivity.  High blood pressure (hypertension).  Obesity.  Family history. SYMPTOMS   When the lower extremities are affected, patients with PVD may experience:  Leg pain with exertion or physical activity. This is called INTERMITTENT CLAUDICATION. This may present as cramping or numbness with physical activity. The location of the pain is associated with the level of blockage. For example, blockage at the abdominal level (distal abdominal aorta) may result in buttock or hip pain. Lower leg arterial blockage may result in calf pain.  As PVD becomes more severe, pain can develop with less physical activity.  In people with severe PVD, leg pain may occur at rest.  Other PVD signs and symptoms:  Leg numbness or weakness.  Coldness in the affected leg or foot, especially when compared to the other leg.  A change in leg color.  Patients with significant PVD are more prone to ulcers or sores on toes, feet or legs. These may take longer to heal or may reoccur. The ulcers or sores can become infected.  If signs and symptoms of PVD are ignored, gangrene may occur. This can result in the loss of toes or loss of an entire limb.  Not all leg pain is related to PVD. Other medical conditions can cause leg pain such  as:  Blood clots (embolism) or Deep Vein Thrombosis.  Inflammation of the blood vessels (vasculitis).  Spinal stenosis. DIAGNOSIS  Diagnosis of PVD can involve several different types of tests. These can include:  Pulse Volume Recording Method (PVR). This test is simple, painless and does not involve the use of X-rays. PVR involves measuring and comparing the blood pressure in the arms and legs. An ABI (Ankle-Brachial Index) is calculated. The normal ratio of blood pressures is 1. As this number becomes smaller, it indicates more severe disease.  < 0.95 - indicates significant narrowing in one or more leg vessels.  <0.8 - there will usually be pain in the foot, leg or buttock with exercise.  <0.4 - will usually have pain in the legs at rest.  <0.25 - usually indicates limb threatening PVD.  Doppler detection of pulses in the legs. This test is painless and checks to see if you have a pulses in your legs/feet.  A dye or contrast material (a substance that highlights the blood vessels so they show up on x-ray) may be given to help your caregiver better see the arteries for the following tests. The dye is eliminated from your body by the kidney's. Your caregiver may order blood work to check your kidney function and other laboratory values before the following tests are performed:  Magnetic Resonance Angiography (MRA). An MRA is a picture study of the blood vessels and arteries. The MRA machine uses a large magnet to produce images of the blood vessels.  Computed Tomography Angiography (CTA). A CTA   is a specialized x-ray that looks at how the blood flows in your blood vessels. An IV may be inserted into your arm so contrast dye can be injected.  Angiogram. Is a procedure that uses x-rays to look at your blood vessels. This procedure is minimally invasive, meaning a small incision (cut) is made in your groin. A small tube (catheter) is then inserted into the artery of your groin. The catheter  is guided to the blood vessel or artery your caregiver wants to examine. Contrast dye is injected into the catheter. X-rays are then taken of the blood vessel or artery. After the images are obtained, the catheter is taken out. TREATMENT  Treatment of PVD involves many interventions which may include:  Lifestyle changes:  Quitting smoking.  Exercise.  Following a low fat, low cholesterol diet.  Control of diabetes.  Foot care is very important to the PVD patient. Good foot care can help prevent infection.  Medication:  Cholesterol-lowering medicine.  Blood pressure medicine.  Anti-platelet drugs.  Certain medicines may reduce symptoms of Intermittent Claudication.  Interventional/Surgical options:  Angioplasty. An Angioplasty is a procedure that inflates a balloon in the blocked artery. This opens the blocked artery to improve blood flow.  Stent Implant. A wire mesh tube (stent) is placed in the artery. The stent expands and stays in place, allowing the artery to remain open.  Peripheral Bypass Surgery. This is a surgical procedure that reroutes the blood around a blocked artery to help improve blood flow. This type of procedure may be performed if Angioplasty or stent implants are not an option. SEEK IMMEDIATE MEDICAL CARE IF:   You develop pain or numbness in your arms or legs.  Your arm or leg turns cold, becomes blue in color.  You develop redness, warmth, swelling and pain in your arms or legs. MAKE SURE YOU:   Understand these instructions.  Will watch your condition.  Will get help right away if you are not doing well or get worse. Document Released: 05/25/2004 Document Revised: 07/10/2011 Document Reviewed: 04/21/2008 [employer] Patient Information 2015 [employer]. This information is not intended to replace advice given to you by your health care provider. Make sure you discuss any questions you have with your health care provider.   Smoking  Cessation Quitting smoking is important to your health and has many advantages. However, it is not always easy to quit since nicotine is a very addictive drug. Oftentimes, people try 3 times or more before being able to quit. This document explains the best ways for you to prepare to quit smoking. Quitting takes hard work and a lot of effort, but you can do it. ADVANTAGES OF QUITTING SMOKING  You will live longer, feel better, and live better.  Your body will feel the impact of quitting smoking almost immediately.  Within 20 minutes, blood pressure decreases. Your pulse returns to its normal level.  After 8 hours, carbon monoxide levels in the blood return to normal. Your oxygen level increases.  After 24 hours, the chance of having a heart attack starts to decrease. Your breath, hair, and body stop smelling like smoke.  After 48 hours, damaged nerve endings begin to recover. Your sense of taste and smell improve.  After 72 hours, the body is virtually free of nicotine. Your bronchial tubes relax and breathing becomes easier.  After 2 to 12 weeks, lungs can hold more air. Exercise becomes easier and circulation improves.  The risk of having a heart attack, stroke, cancer,   or lung disease is greatly reduced.  After 1 year, the risk of coronary heart disease is cut in half.  After 5 years, the risk of stroke falls to the same as a nonsmoker.  After 10 years, the risk of lung cancer is cut in half and the risk of other cancers decreases significantly.  After 15 years, the risk of coronary heart disease drops, usually to the level of a nonsmoker.  If you are pregnant, quitting smoking will improve your chances of having a healthy baby.  The people you live with, especially any children, will be healthier.  You will have extra money to spend on things other than cigarettes. QUESTIONS TO THINK ABOUT BEFORE ATTEMPTING TO QUIT You may want to talk about your answers with your health care  provider.  Why do you want to quit?  If you tried to quit in the past, what helped and what did not?  What will be the most difficult situations for you after you quit? How will you plan to handle them?  Who can help you through the tough times? Your family? Friends? A health care provider?  What pleasures do you get from smoking? What ways can you still get pleasure if you quit? Here are some questions to ask your health care provider:  How can you help me to be successful at quitting?  What medicine do you think would be best for me and how should I take it?  What should I do if I need more help?  What is smoking withdrawal like? How can I get information on withdrawal? GET READY  Set a quit date.  Change your environment by getting rid of all cigarettes, ashtrays, matches, and lighters in your home, car, or work. Do not let people smoke in your home.  Review your past attempts to quit. Think about what worked and what did not. GET SUPPORT AND ENCOURAGEMENT You have a better chance of being successful if you have help. You can get support in many ways.  Tell your family, friends, and coworkers that you are going to quit and need their support. Ask them not to smoke around you.  Get individual, group, or telephone counseling and support. Programs are available at local hospitals and health centers. Call your local health department for information about programs in your area.  Spiritual beliefs and practices may help some smokers quit.  Download a "quit meter" on your computer to keep track of quit statistics, such as how long you have gone without smoking, cigarettes not smoked, and money saved.  Get a self-help book about quitting smoking and staying off tobacco. LEARN NEW SKILLS AND BEHAVIORS  Distract yourself from urges to smoke. Talk to someone, go for a walk, or occupy your time with a task.  Change your normal routine. Take a different route to work. Drink tea  instead of coffee. Eat breakfast in a different place.  Reduce your stress. Take a hot bath, exercise, or read a book.  Plan something enjoyable to do every day. Reward yourself for not smoking.  Explore interactive web-based programs that specialize in helping you quit. GET MEDICINE AND USE IT CORRECTLY Medicines can help you stop smoking and decrease the urge to smoke. Combining medicine with the above behavioral methods and support can greatly increase your chances of successfully quitting smoking.  Nicotine replacement therapy helps deliver nicotine to your body without the negative effects and risks of smoking. Nicotine replacement therapy includes nicotine gum, lozenges, inhalers,   nasal sprays, and skin patches. Some may be available over-the-counter and others require a prescription.  Antidepressant medicine helps people abstain from smoking, but how this works is unknown. This medicine is available by prescription.  Nicotinic receptor partial agonist medicine simulates the effect of nicotine in your brain. This medicine is available by prescription. Ask your health care provider for advice about which medicines to use and how to use them based on your health history. Your health care provider will tell you what side effects to look out for if you choose to be on a medicine or therapy. Carefully read the information on the package. Do not use any other product containing nicotine while using a nicotine replacement product.  RELAPSE OR DIFFICULT SITUATIONS Most relapses occur within the first 3 months after quitting. Do not be discouraged if you start smoking again. Remember, most people try several times before finally quitting. You may have symptoms of withdrawal because your body is used to nicotine. You may crave cigarettes, be irritable, feel very hungry, cough often, get headaches, or have difficulty concentrating. The withdrawal symptoms are only temporary. They are strongest when you  first quit, but they will go away within 10-14 days. To reduce the chances of relapse, try to:  Avoid drinking alcohol. Drinking lowers your chances of successfully quitting.  Reduce the amount of caffeine you consume. Once you quit smoking, the amount of caffeine in your body increases and can give you symptoms, such as a rapid heartbeat, sweating, and anxiety.  Avoid smokers because they can make you want to smoke.  Do not let weight gain distract you. Many smokers will gain weight when they quit, usually less than 10 pounds. Eat a healthy diet and stay active. You can always lose the weight gained after you quit.  Find ways to improve your mood other than smoking. FOR MORE INFORMATION  www.smokefree.gov  Document Released: 04/11/2001 Document Revised: 09/01/2013 Document Reviewed: 07/27/2011 [facility] Patient Information 2015 [place], [place]. This information is not intended to replace advice given to you by your health care provider. Make sure you discuss any questions you have with your health care provider.   Stroke Prevention Some medical conditions and behaviors are associated with an increased chance of having a stroke. You may prevent a stroke by making healthy choices and managing medical conditions. HOW CAN I REDUCE MY RISK OF HAVING A STROKE?   Stay physically active. Get at least 30 minutes of activity on most or all days.  Do not smoke. It may also be helpful to avoid exposure to secondhand smoke.  Limit alcohol use. Moderate alcohol use is considered to be:  No more than 2 drinks per day for men.  No more than 1 drink per day for nonpregnant women.  Eat healthy foods. This involves:  Eating 5 or more servings of fruits and vegetables a day.  Making dietary changes that address high blood pressure (hypertension), high cholesterol, diabetes, or obesity.  Manage your cholesterol levels.  Making food choices that are high in fiber and low in saturated fat, trans  fat, and cholesterol may control cholesterol levels.  Take any prescribed medicines to control cholesterol as directed by your health care provider.  Manage your diabetes.  Controlling your carbohydrate and sugar intake is recommended to manage diabetes.  Take any prescribed medicines to control diabetes as directed by your health care provider.  Control your hypertension.  Making food choices that are low in salt (sodium), saturated fat, trans fat, and  cholesterol is recommended to manage hypertension.  Take any prescribed medicines to control hypertension as directed by your health care provider.  Maintain a healthy weight.  Reducing calorie intake and making food choices that are low in sodium, saturated fat, trans fat, and cholesterol are recommended to manage weight.  Stop drug abuse.  Avoid taking birth control pills.  Talk to your health care provider about the risks of taking birth control pills if you are over 83 years old, smoke, get migraines, or have ever had a blood clot.  Get evaluated for sleep disorders (sleep apnea).  Talk to your health care provider about getting a sleep evaluation if you snore a lot or have excessive sleepiness.  Take medicines only as directed by your health care provider.  For some people, aspirin or blood thinners (anticoagulants) are helpful in reducing the risk of forming abnormal blood clots that can lead to stroke. If you have the irregular heart rhythm of atrial fibrillation, you should be on a blood thinner unless there is a good reason you cannot take them.  Understand all your medicine instructions.  Make sure that other conditions (such as anemia or atherosclerosis) are addressed. SEEK IMMEDIATE MEDICAL CARE IF:   You have sudden weakness or numbness of the face, arm, or leg, especially on one side of the body.  Your face or eyelid droops to one side.  You have sudden confusion.  You have trouble speaking (aphasia) or  understanding.  You have sudden trouble seeing in one or both eyes.  You have sudden trouble walking.  You have dizziness.  You have a loss of balance or coordination.  You have a sudden, severe headache with no known cause.  You have new chest pain or an irregular heartbeat. Any of these symptoms may represent a serious problem that is an emergency. Do not wait to see if the symptoms will go away. Get medical help at once. Call your local emergency services (911 in [place] Do not drive yourself to the hospital. Document Released: 05/25/2004 Document Revised: 09/01/2013 Document Reviewed: 10/18/2012 [facility] Patient Information 2015 [place], [place]. This information is not intended to replace advice given to you by your health care provider. Make sure you discuss any questions you have with your health care provider.

## 2013-12-01 NOTE — Progress Notes (Signed)
[facility] [place] HISTORY AND PHYSICAL -PAD  History of Present Illness Gary [name] is a 58 y.o. male patient of Dr. 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 He has a history of an aortobifemoral bypass graft in 2010. He stepped on a nail while in [place] and this got infected. This was the reason for his toe amputation. He has a history of a right BKA  amputation. He did require wet-to-dry dressing changes temporarily. His wound is now completely healed. He denies any issues with his right BKA stump, denies non healing wounds in his left lower extremity, denies claudication symptoms in left leg with walking. He walks well with his prosthesis.  He reports a long history of chronic cough, denies dyspnea.  He denies any history of stroke or TIA.  The patient denies New Medical or Surgical History. He states he is physically active and does not use ETOH.  Pt Diabetic: Yes, states in control Pt smoker: smoker  (1/3 ppd, started at age 26 yrs)  Pt meds include: Statin :Yes ASA: Yes Other anticoagulants/antiplatelets: no  Past Medical History  Diagnosis Date  . Hypertension   . Diabetes mellitus   . High cholesterol   . Blurred vision   . Insomnia   . Peripheral artery disease   . GERD (gastroesophageal reflux disease)   . Constipation   . Sensorineural hearing loss     left ear  . [place] stain of face     Left V1 distribution, including upper eyelid  . Vitamin D deficiency     Social History History  Substance Use Topics  . Smoking status: Light Tobacco Smoker -- 0.30 packs/day for .4 years    Types: Cigarettes  . Smokeless tobacco: Never Used     Comment: taking chantix. down from 2 packs/day  . Alcohol Use: No    Family History Family History  Problem Relation Age of Onset  . Diabetes Mother   . Hypertension Mother   . Cancer Father     Past Surgical History  Procedure Laterality Date  . Amputation Right  07/17/2011  . Peripheral arterial stent graft N/A 2010  . Amputation Left 10/03/2012    Procedure: AMPUTATION DIGIT;  Surgeon: Serafina Mitchell, MD;  Location: [facility] OR;  Service: Vascular;  Laterality: Left;  Amputation of left Great  toe    Allergies  Allergen Reactions  . Oxycodone-Acetaminophen Rash    Macular papular rash after two days on Oxy/APAP resolved with d/c of drug.    Current Outpatient Prescriptions  Medication Sig Dispense Refill  . aspirin EC 81 MG tablet Take 81 mg by mouth daily.      . calcium carbonate (TUMS EX) 750 MG chewable tablet Chew 2 tablets by mouth every 4 (four) hours as needed for heartburn.      . Cholecalciferol (VITAMIN D-3) 1000 UNITS CAPS Take 1,000 Units by mouth daily.      . collagenase (SANTYL) ointment Apply 1 application topically daily. 1 app applied topically daily.  Will be done in the clinic on assigned center days otherwise participant will change the dressing at home.      . docusate sodium (COLACE) 100 MG capsule Take 100 mg by mouth at bedtime as needed for constipation.      Marland Kitchen glimepiride (AMARYL) 1 MG tablet Take 1 mg by mouth daily.      . insulin glargine (LANTUS) 100 UNIT/ML injection Inject 40 Units into the skin  at bedtime.       Marland Kitchen lisinopril (PRINIVIL,ZESTRIL) 5 MG tablet Take 10 mg by mouth daily.       . metFORMIN (GLUCOPHAGE) 500 MG tablet Take 1,000 mg by mouth 2 (two) times daily with a meal.       . omeprazole (PRILOSEC) 20 MG capsule Take 20 mg by mouth daily.      Marland Kitchen oxyCODONE-acetaminophen (PERCOCET/ROXICET) 5-325 MG per tablet Take 1-2 tablets by mouth every 4 (four) hours as needed.  40 tablet  0  . rosuvastatin (CRESTOR) 20 MG tablet Take 20 mg by mouth daily.      . varenicline (CHANTIX) 1 MG tablet Take 1 mg by mouth 2 (two) times daily.      Marland Kitchen zolpidem (AMBIEN) 5 MG tablet Take 5 mg by mouth at bedtime as needed for sleep.       No current facility-administered medications for this visit.    ROS: See HPI for  pertinent positives and negatives.   Physical Examination  Filed Vitals:   12/01/13 1202  BP: 158/79  Pulse: 59  Resp: 16  Height: 5\' 6"  (1.676 m)  Weight: 173 lb (78.472 kg)  SpO2: 98%   Body mass index is 27.94 kg/(m^2).  General: A&O x 3, WDWN. Gait: normal Eyes: PERRLA. Pulmonary: CTAB, without wheezes , rales or rhonchi. Cardiac: regular Rythm , without detected murmur.         Carotid Bruits Right Left   Negative Positive  Aorta is not palpable. Radial pulses: 2+ palpable and =                           VASCULAR EXAM: Extremities without ischemic changes  without Gangrene; without open wounds. Wearing right BKA prosthesis. Left great toe surgically absent.                                                                                                          LE Pulses Right Left       FEMORAL  not palpable  not palpable        POPLITEAL  not palpable   not palpable       POSTERIOR TIBIAL  BKA   not palpable        DORSALIS PEDIS      ANTERIOR TIBIAL BKA  not palpable    Abdomen: soft, NT, no masses. Skin: no rashes, no ulcers noted. Musculoskeletal: no muscle wasting or atrophy.  Neurologic: A&O X 3; Appropriate Affect ; SENSATION: normal; MOTOR FUNCTION:  moving all extremities equally, motor strength 5/5 throughout. Speech is fluent/normal. CN 2-12 intact.    Non-Invasive Vascular Imaging: DATE: 12/01/2013 ABI: RIGHT absent, right BKA,  LEFT 0.85, Waveforms: monophasic Previous left ABI: 0.78  ASSESSMENT: Gary [name] is a 58 y.o. male who is status post left great toe amputation on 10/03/2012. He has a history of an aortobifemoral bypass graft in 2010. He stepped on a nail while in [place] and this got infected. This was the reason for  his toe amputation. He denies any issues with his right BKA stump, denies non healing wounds in his left lower extremity, denies claudication symptoms in left leg with walking. He walks well with his prosthesis.    He has no history of stroke or TIA, but does have DM and smokes. Left carotid bruit auscultated.  Crackles auscultated in left base, advised pt to see his PCP re this.  PLAN:  I discussed in depth with the patient the nature of atherosclerosis, and emphasized the importance of maximal medical management including strict control of blood pressure, blood glucose, and lipid levels, obtaining regular exercise, and cessation of smoking.  The patient is aware that without maximal medical management the underlying atherosclerotic disease process will progress, limiting the benefit of any interventions.  He was counseled re smoking cessation.  Based on the patient's vascular studies and examination, pt will return to clinic in 1 month for carotid Duplex and 1 year for ABI's.  The patient was given information about PAD including signs, symptoms, treatment, what symptoms should prompt the patient to seek immediate medical care, and risk reduction measures to take.  Clemon Chambers, RN, MSN, FNP-C [facility] Phone: [phone]  Clinic MD: Trula Slade  12/01/2013 12:16 PM

## 2013-12-19 ENCOUNTER — Encounter: Payer: Self-pay | Admitting: Family

## 2013-12-22 ENCOUNTER — Ambulatory Visit: Payer: Medicare (Managed Care) | Admitting: Family

## 2013-12-22 ENCOUNTER — Other Ambulatory Visit (HOSPITAL_COMMUNITY): Payer: Medicare (Managed Care)

## 2014-01-16 ENCOUNTER — Encounter: Payer: Self-pay | Admitting: Family

## 2014-01-19 ENCOUNTER — Encounter: Payer: Medicare (Managed Care) | Admitting: Family

## 2014-01-19 ENCOUNTER — Ambulatory Visit (HOSPITAL_COMMUNITY)
Admission: RE | Admit: 2014-01-19 | Discharge: 2014-01-19 | Disposition: A | Discharge status: Home / Self Care | Payer: Medicare (Managed Care) | Source: Ambulatory Visit | Attending: Family | Admitting: Family

## 2014-01-19 DIAGNOSIS — R0989 Other specified symptoms and signs involving the circulatory and respiratory systems: Secondary | ICD-10-CM | POA: Insufficient documentation

## 2014-01-20 ENCOUNTER — Encounter: Payer: Self-pay | Admitting: Surgery

## 2014-01-20 NOTE — Patient Instructions (Signed)
Dear [name],  Your recent Lab.  Visit Sept. 21, 2015 indicates: NO significant change in bilateral carotid artery velocities  Noted when compared to the previous exam on 10-05-08.  Please follow up with [place] in one year.             Stroke Prevention Some medical conditions and behaviors are associated with an increased chance of having a stroke. You may prevent a stroke by making healthy choices and managing medical conditions. HOW CAN I REDUCE MY RISK OF HAVING A STROKE?   Stay physically active. Get at least 30 minutes of activity on most or all days.  Do not smoke. It may also be helpful to avoid exposure to secondhand smoke.  Limit alcohol use. Moderate alcohol use is considered to be:  No more than 2 drinks per day for men.  No more than 1 drink per day for nonpregnant women.  Eat healthy foods. This involves:  Eating 5 or more servings of fruits and vegetables a day.  Making dietary changes that address high blood pressure (hypertension), high cholesterol, diabetes, or obesity.  Manage your cholesterol levels.  Making food choices that are high in fiber and low in saturated fat, trans fat, and cholesterol may control cholesterol levels.  Take any prescribed medicines to control cholesterol as directed by your health care provider.  Manage your diabetes.  Controlling your carbohydrate and sugar intake is recommended to manage diabetes.  Take any prescribed medicines to control diabetes as directed by your health care provider.  Control your hypertension.  Making food choices that are low in salt (sodium), saturated fat, trans fat, and cholesterol is recommended to manage hypertension.  Take any prescribed medicines to control hypertension as directed by your health care provider.  Maintain a healthy weight.  Reducing calorie intake and making food choices that are low in sodium, saturated fat, trans fat, and cholesterol are recommended to manage  weight.  Stop drug abuse.  Avoid taking birth control pills.  Talk to your health care provider about the risks of taking birth control pills if you are over 31 years old, smoke, get migraines, or have ever had a blood clot.  Get evaluated for sleep disorders (sleep apnea).  Talk to your health care provider about getting a sleep evaluation if you snore a lot or have excessive sleepiness.  Take medicines only as directed by your health care provider.  For some people, aspirin or blood thinners (anticoagulants) are helpful in reducing the risk of forming abnormal blood clots that can lead to stroke. If you have the irregular heart rhythm of atrial fibrillation, you should be on a blood thinner unless there is a good reason you cannot take them.  Understand all your medicine instructions.  Make sure that other conditions (such as anemia or atherosclerosis) are addressed. SEEK IMMEDIATE MEDICAL CARE IF:   You have sudden weakness or numbness of the face, arm, or leg, especially on one side of the body.  Your face or eyelid droops to one side.  You have sudden confusion.  You have trouble speaking (aphasia) or understanding.  You have sudden trouble seeing in one or both eyes.  You have sudden trouble walking.  You have dizziness.  You have a loss of balance or coordination.  You have a sudden, severe headache with no known cause.  You have new chest pain or an irregular heartbeat. Any of these symptoms may represent a serious problem that is an emergency. Do not wait  to see if the symptoms will go away. Get medical help at once. Call your local emergency services (911 in [place] Do not drive yourself to the hospital. Document Released: 05/25/2004 Document Revised: 09/01/2013 Document Reviewed: 10/18/2012 [facility] Patient Information 2015 [place], [place]. This information is not intended to replace advice given to you by your health care provider. Make sure you discuss any  questions you have with your health care provider. Peripheral Vascular Disease  Peripheral vascular disease (PVD) is caused by cholesterol buildup in the arteries. The arteries become narrow or clogged. This makes it hard for blood to flow. It happens most in the legs, but it can occur in other areas of your body. HOME CARE   Quit smoking, if you smoke.  Exercise as told by your doctor.  Follow a low-fat, low-cholesterol diet as told by your doctor.  Control your diabetes, if you have diabetes.  Care for your feet to prevent infection.  Only take medicine as told by your doctor. GET HELP RIGHT AWAY IF:   You have pain or lose feeling (numbness) in your arms or legs.  Your arms or legs turn cold or blue.  You have redness, warmth, and puffiness (swelling) in your arms or legs. MAKE SURE YOU:   Understand these instructions.  Will watch your condition.  Will get help right away if you are not doing well or get worse. Document Released: 07/12/2009 Document Revised: 07/10/2011 Document Reviewed: 07/12/2009 [facility] Patient Information 2015 [place], [place]. This information is not intended to replace advice given to you by your health care provider. Make sure you discuss any questions you have with your health care provider.

## 2014-01-21 NOTE — Progress Notes (Signed)
Lab only 

## 2014-08-11 ENCOUNTER — Telehealth: Payer: Self-pay | Admitting: Gastroenterology

## 2014-08-11 ENCOUNTER — Ambulatory Visit (AMBULATORY_SURGERY_CENTER): Payer: Self-pay | Admitting: *Deleted

## 2014-08-11 VITALS — Ht 66.0 in | Wt 171.0 lb

## 2014-08-11 DIAGNOSIS — Z1211 Encounter for screening for malignant neoplasm of colon: Secondary | ICD-10-CM

## 2014-08-11 MED ORDER — MOVIPREP 100 G PO SOLR: 1.0000 | Freq: Once | ORAL | Status: DC

## 2014-08-11 NOTE — Progress Notes (Signed)
Denies allergies to eggs or soy products. Denies complications with sedation or anesthesia. Denies O2 use. Denies use of diet or weight loss medications.  Emmi instructions not given for colonoscopy, patient does not have access to a computer

## 2014-08-11 NOTE — Telephone Encounter (Signed)
Spoke with Gary [name] and she states she did receive faxed prescription.

## 2014-08-11 NOTE — Telephone Encounter (Signed)
Faxed printed prescription to [facility] at [place] [phone] as requested.

## 2014-08-25 ENCOUNTER — Encounter: Payer: Self-pay | Admitting: Gastroenterology

## 2014-08-25 ENCOUNTER — Ambulatory Visit (AMBULATORY_SURGERY_CENTER): Payer: Medicare (Managed Care) | Admitting: Gastroenterology

## 2014-08-25 VITALS — BP 150/77 | HR 61 | Temp 96.2°F | Resp 18 | Ht 66.0 in | Wt 171.0 lb

## 2014-08-25 DIAGNOSIS — Z1211 Encounter for screening for malignant neoplasm of colon: Secondary | ICD-10-CM | POA: Diagnosis present

## 2014-08-25 DIAGNOSIS — D122 Benign neoplasm of ascending colon: Secondary | ICD-10-CM | POA: Diagnosis not present

## 2014-08-25 LAB — GLUCOSE, CAPILLARY
Glucose-Capillary: 172 mg/dL — ABNORMAL HIGH (ref 70–99)
Glucose-Capillary: 173 mg/dL — ABNORMAL HIGH (ref 70–99)

## 2014-08-25 MED ORDER — SODIUM CHLORIDE 0.9 % IV SOLN: 500.0000 mL | INTRAVENOUS | Status: DC

## 2014-08-25 NOTE — Progress Notes (Signed)
Called to room to assist during endoscopic procedure.  Patient ID and intended procedure confirmed with present staff. Received instructions for my participation in the procedure from the performing physician.  

## 2014-08-25 NOTE — Patient Instructions (Signed)
YOU HAD AN ENDOSCOPIC PROCEDURE TODAY AT [facility]:   Refer to the procedure report that was given to you for any specific questions about what was found during the examination.  If the procedure report does not answer your questions, please call your gastroenterologist to clarify.  If you requested that your care partner not be given the details of your procedure findings, then the procedure report has been included in a sealed envelope for you to review at your convenience later.  YOU SHOULD EXPECT: Some feelings of bloating in the abdomen. Passage of more gas than usual.  Walking can help get rid of the air that was put into your GI tract during the procedure and reduce the bloating. If you had a lower endoscopy (such as a colonoscopy or flexible sigmoidoscopy) you may notice spotting of blood in your stool or on the toilet paper. If you underwent a bowel prep for your procedure, you may not have a normal bowel movement for a few days.  Please Note:  You might notice some irritation and congestion in your nose or some drainage.  This is from the oxygen used during your procedure.  There is no need for concern and it should clear up in a day or so.  SYMPTOMS TO REPORT IMMEDIATELY:   Following lower endoscopy (colonoscopy or flexible sigmoidoscopy):  Excessive amounts of blood in the stool  Significant tenderness or worsening of abdominal pains  Swelling of the abdomen that is new, acute  Fever of 100F or higher   For urgent or emergent issues, a gastroenterologist can be reached at any hour by calling [phone].   DIET: Your first meal following the procedure should be a small meal and then it is ok to progress to your normal diet. Heavy or fried foods are harder to digest and may make you feel nauseous or bloated.  Likewise, meals heavy in dairy and vegetables can increase bloating.  Drink plenty of fluids but you should avoid alcoholic beverages for 24  hours.  ACTIVITY:  You should plan to take it easy for the rest of today and you should NOT DRIVE or use heavy machinery until tomorrow (because of the sedation medicines used during the test).    FOLLOW UP: Our staff will call the number listed on your records the next business day following your procedure to check on you and address any questions or concerns that you may have regarding the information given to you following your procedure. If we do not reach you, we will leave a message.  However, if you are feeling well and you are not experiencing any problems, there is no need to return our call.  We will assume that you have returned to your regular daily activities without incident.  If any biopsies were taken you will be contacted by phone or by letter within the next 1-3 weeks.  Please call us at [phone] if you have not heard about the biopsies in 3 weeks.    SIGNATURES/CONFIDENTIALITY: You and/or your care partner have signed paperwork which will be entered into your electronic medical record.  These signatures attest to the fact that that the information above on your After Visit Summary has been reviewed and is understood.  Full responsibility of the confidentiality of this discharge information lies with you and/or your care-partner.   Resume medications. Information given on polyps with discharge instructions.

## 2014-08-25 NOTE — Progress Notes (Signed)
Patient awakening,vss,report to rn 

## 2014-08-25 NOTE — Op Note (Signed)
[place]  [address] [place], [address]   COLONOSCOPY PROCEDURE REPORT  PATIENT: Gary [name], Gary [name]  MR#: [id number] BIRTHDATE: [birth year] , 86  yrs. old GENDER: male ENDOSCOPIST: Milus Banister, MD REFERRED [id number] Bradd Burner, M.D. PROCEDURE DATE:  08/25/2014 PROCEDURE:   Colonoscopy with snare polypectomy and Colonoscopy, screening First Screening Colonoscopy - Avg.  risk and is 50 yrs.  old or older Yes.  Prior Negative Screening - Now for repeat screening. N/A  History of Adenoma - Now for follow-up colonoscopy & has been > or = to 3 yrs.  N/A ASA CLASS:   Class II INDICATIONS:Screening for colonic neoplasia and Colorectal Neoplasm Risk Assessment for this procedure is average risk. MEDICATIONS: Monitored anesthesia care and Propofol 350 mg IV  DESCRIPTION OF PROCEDURE:   After the risks benefits and alternatives of the procedure were thoroughly explained, informed consent was obtained.  The digital rectal exam revealed no abnormalities of the rectum.   The LB [id number]  endoscope was introduced through the anus and advanced to the cecum, which was identified by both the appendix and ileocecal valve. No adverse events experienced.   The quality of the prep was adequate  The instrument was then slowly withdrawn as the colon was fully examined.   COLON FINDINGS: A pedunculated polyp measuring 8 mm in size was found in the ascending colon.  A polypectomy was performed using snare cautery.  The resection was complete, the polyp tissue was completely retrieved and sent to histology.   The examination was otherwise normal.  Retroflexed views revealed no abnormalities. The time to cecum = 3.4 Withdrawal time = 11.2   The scope was withdrawn and the procedure completed. COMPLICATIONS: There were no immediate complications.  ENDOSCOPIC IMPRESSION: 1.   Pedunculated polyp was found, removed and sent to pathology 2.   The examination was otherwise  normal  RECOMMENDATIONS: If the polyp(s) removed today are proven to be adenomatous (pre-cancerous) polyps, you will need a repeat colonoscopy in 5 years.  Otherwise you should continue to follow colorectal cancer screening guidelines for "routine risk" patients with colonoscopy in 10 years.  You will receive a letter within 1-2 weeks with the results of your biopsy as well as final recommendations.  Please call my office if you have not received a letter after 3 weeks.  eSigned:  Milus Banister, MD 08/25/2014 7:55 AM

## 2014-08-26 ENCOUNTER — Telehealth: Payer: Self-pay | Admitting: *Deleted

## 2014-08-26 NOTE — Telephone Encounter (Signed)
  Follow up Call-  Call back number 08/25/2014  Post procedure Call Back phone  # [phone]  Permission to leave phone message Yes     NO ANSWER,UNABLE TO [place]..."VOICE BOX NOT SET UP".

## 2014-09-08 ENCOUNTER — Encounter: Payer: Self-pay | Admitting: Gastroenterology

## 2014-12-07 ENCOUNTER — Encounter (HOSPITAL_COMMUNITY): Payer: Medicare (Managed Care)

## 2014-12-07 ENCOUNTER — Ambulatory Visit: Payer: Medicare (Managed Care) | Admitting: Family

## 2015-01-20 ENCOUNTER — Encounter: Payer: Self-pay | Admitting: Family

## 2015-01-25 ENCOUNTER — Ambulatory Visit (INDEPENDENT_AMBULATORY_CARE_PROVIDER_SITE_OTHER): Payer: Medicare (Managed Care) | Admitting: Family

## 2015-01-25 ENCOUNTER — Encounter: Payer: Self-pay | Admitting: Family

## 2015-01-25 ENCOUNTER — Ambulatory Visit (INDEPENDENT_AMBULATORY_CARE_PROVIDER_SITE_OTHER)
Admission: RE | Admit: 2015-01-25 | Discharge: 2015-01-25 | Disposition: A | Discharge status: Home / Self Care | Payer: Medicare (Managed Care) | Source: Ambulatory Visit | Attending: Family | Admitting: Family

## 2015-01-25 ENCOUNTER — Ambulatory Visit (HOSPITAL_COMMUNITY)
Admission: RE | Admit: 2015-01-25 | Discharge: 2015-01-25 | Disposition: A | Discharge status: Home / Self Care | Payer: Medicare (Managed Care) | Source: Ambulatory Visit | Attending: Family | Admitting: Family

## 2015-01-25 VITALS — BP 195/87 | HR 65 | Temp 97.4°F | Resp 16 | Ht 67.5 in | Wt 167.0 lb

## 2015-01-25 DIAGNOSIS — I739 Peripheral vascular disease, unspecified: Secondary | ICD-10-CM

## 2015-01-25 DIAGNOSIS — E119 Type 2 diabetes mellitus without complications: Secondary | ICD-10-CM | POA: Insufficient documentation

## 2015-01-25 DIAGNOSIS — I10 Essential (primary) hypertension: Secondary | ICD-10-CM | POA: Diagnosis not present

## 2015-01-25 DIAGNOSIS — Z89412 Acquired absence of left great toe: Secondary | ICD-10-CM | POA: Diagnosis not present

## 2015-01-25 DIAGNOSIS — F17200 Nicotine dependence, unspecified, uncomplicated: Secondary | ICD-10-CM

## 2015-01-25 DIAGNOSIS — Z9889 Other specified postprocedural states: Secondary | ICD-10-CM | POA: Diagnosis not present

## 2015-01-25 DIAGNOSIS — I779 Disorder of arteries and arterioles, unspecified: Secondary | ICD-10-CM

## 2015-01-25 DIAGNOSIS — I6523 Occlusion and stenosis of bilateral carotid arteries: Secondary | ICD-10-CM | POA: Diagnosis not present

## 2015-01-25 DIAGNOSIS — Z720 Tobacco use: Secondary | ICD-10-CM

## 2015-01-25 DIAGNOSIS — Z89511 Acquired absence of right leg below knee: Secondary | ICD-10-CM | POA: Diagnosis not present

## 2015-01-25 DIAGNOSIS — Z95828 Presence of other vascular implants and grafts: Secondary | ICD-10-CM

## 2015-01-25 DIAGNOSIS — E785 Hyperlipidemia, unspecified: Secondary | ICD-10-CM | POA: Diagnosis not present

## 2015-01-25 NOTE — Progress Notes (Signed)
Filed Vitals:   01/25/15 1137 01/25/15 1145  BP: 195/90 195/87  Pulse: 65 65  Temp: 97.4 F (36.3 C)   TempSrc: Oral   Resp: 16   Height: 5' 7.5" (1.715 m)   Weight: 167 lb (75.751 kg)

## 2015-01-25 NOTE — Patient Instructions (Addendum)
Peripheral Vascular Disease Peripheral Vascular Disease (PVD), also called Peripheral Arterial Disease (PAD), is a circulation problem caused by cholesterol (atherosclerotic plaque) deposits in the arteries. PVD commonly occurs in the lower extremities (legs) but it can occur in other areas of the body, such as your arms. The cholesterol buildup in the arteries reduces blood flow which can cause pain and other serious problems. The presence of PVD can place a person at risk for Coronary Artery Disease (CAD).  CAUSES  Causes of PVD can be many. It is usually associated with more than one risk factor such as:   High Cholesterol.  Smoking.  Diabetes.  Lack of exercise or inactivity.  High blood pressure (hypertension).  Obesity.  Family history. SYMPTOMS   When the lower extremities are affected, patients with PVD may experience:  Leg pain with exertion or physical activity. This is called INTERMITTENT CLAUDICATION. This may present as cramping or numbness with physical activity. The location of the pain is associated with the level of blockage. For example, blockage at the abdominal level (distal abdominal aorta) may result in buttock or hip pain. Lower leg arterial blockage may result in calf pain.  As PVD becomes more severe, pain can develop with less physical activity.  In people with severe PVD, leg pain may occur at rest.  Other PVD signs and symptoms:  Leg numbness or weakness.  Coldness in the affected leg or foot, especially when compared to the other leg.  A change in leg color.  Patients with significant PVD are more prone to ulcers or sores on toes, feet or legs. These may take longer to heal or may reoccur. The ulcers or sores can become infected.  If signs and symptoms of PVD are ignored, gangrene may occur. This can result in the loss of toes or loss of an entire limb.  Not all leg pain is related to PVD. Other medical conditions can cause leg pain such  as:  Blood clots (embolism) or Deep Vein Thrombosis.  Inflammation of the blood vessels (vasculitis).  Spinal stenosis. DIAGNOSIS  Diagnosis of PVD can involve several different types of tests. These can include:  Pulse Volume Recording Method (PVR). This test is simple, painless and does not involve the use of X-rays. PVR involves measuring and comparing the blood pressure in the arms and legs. An ABI (Ankle-Brachial Index) is calculated. The normal ratio of blood pressures is 1. As this number becomes smaller, it indicates more severe disease.  < 0.95 - indicates significant narrowing in one or more leg vessels.  <0.8 - there will usually be pain in the foot, leg or buttock with exercise.  <0.4 - will usually have pain in the legs at rest.  <0.25 - usually indicates limb threatening PVD.  Doppler detection of pulses in the legs. This test is painless and checks to see if you have a pulses in your legs/feet.  A dye or contrast material (a substance that highlights the blood vessels so they show up on x-ray) may be given to help your caregiver better see the arteries for the following tests. The dye is eliminated from your body by the kidney's. Your caregiver may order blood work to check your kidney function and other laboratory values before the following tests are performed:  Magnetic Resonance Angiography (MRA). An MRA is a picture study of the blood vessels and arteries. The MRA machine uses a large magnet to produce images of the blood vessels.  Computed Tomography Angiography (CTA). A CTA   is a specialized x-ray that looks at how the blood flows in your blood vessels. An IV may be inserted into your arm so contrast dye can be injected.  Angiogram. Is a procedure that uses x-rays to look at your blood vessels. This procedure is minimally invasive, meaning a small incision (cut) is made in your groin. A small tube (catheter) is then inserted into the artery of your groin. The catheter  is guided to the blood vessel or artery your caregiver wants to examine. Contrast dye is injected into the catheter. X-rays are then taken of the blood vessel or artery. After the images are obtained, the catheter is taken out. TREATMENT  Treatment of PVD involves many interventions which may include:  Lifestyle changes:  Quitting smoking.  Exercise.  Following a low fat, low cholesterol diet.  Control of diabetes.  Foot care is very important to the PVD patient. Good foot care can help prevent infection.  Medication:  Cholesterol-lowering medicine.  Blood pressure medicine.  Anti-platelet drugs.  Certain medicines may reduce symptoms of Intermittent Claudication.  Interventional/Surgical options:  Angioplasty. An Angioplasty is a procedure that inflates a balloon in the blocked artery. This opens the blocked artery to improve blood flow.  Stent Implant. A wire mesh tube (stent) is placed in the artery. The stent expands and stays in place, allowing the artery to remain open.  Peripheral Bypass Surgery. This is a surgical procedure that reroutes the blood around a blocked artery to help improve blood flow. This type of procedure may be performed if Angioplasty or stent implants are not an option. SEEK IMMEDIATE MEDICAL CARE IF:   You develop pain or numbness in your arms or legs.  Your arm or leg turns cold, becomes blue in color.  You develop redness, warmth, swelling and pain in your arms or legs. MAKE SURE YOU:   Understand these instructions.  Will watch your condition.  Will get help right away if you are not doing well or get worse. Document Released: 05/25/2004 Document Revised: 07/10/2011 Document Reviewed: 04/21/2008 [employer] Patient Information 2015 [employer]. This information is not intended to replace advice given to you by your health care provider. Make sure you discuss any questions you have with your health care provider.   Stroke  Prevention Some medical conditions and behaviors are associated with an increased chance of having a stroke. You may prevent a stroke by making healthy choices and managing medical conditions. HOW CAN I REDUCE MY RISK OF HAVING A STROKE?   Stay physically active. Get at least 30 minutes of activity on most or all days.  Do not smoke. It may also be helpful to avoid exposure to secondhand smoke.  Limit alcohol use. Moderate alcohol use is considered to be:  No more than 2 drinks per day for men.  No more than 1 drink per day for nonpregnant women.  Eat healthy foods. This involves:  Eating 5 or more servings of fruits and vegetables a day.  Making dietary changes that address high blood pressure (hypertension), high cholesterol, diabetes, or obesity.  Manage your cholesterol levels.  Making food choices that are high in fiber and low in saturated fat, trans fat, and cholesterol may control cholesterol levels.  Take any prescribed medicines to control cholesterol as directed by your health care provider.  Manage your diabetes.  Controlling your carbohydrate and sugar intake is recommended to manage diabetes.  Take any prescribed medicines to control diabetes as directed by your health care provider.    Control your hypertension.  Making food choices that are low in salt (sodium), saturated fat, trans fat, and cholesterol is recommended to manage hypertension.  Take any prescribed medicines to control hypertension as directed by your health care provider.  Maintain a healthy weight.  Reducing calorie intake and making food choices that are low in sodium, saturated fat, trans fat, and cholesterol are recommended to manage weight.  Stop drug abuse.  Avoid taking birth control pills.  Talk to your health care provider about the risks of taking birth control pills if you are over 35 years old, smoke, get migraines, or have ever had a blood clot.  Get evaluated for sleep  disorders (sleep apnea).  Talk to your health care provider about getting a sleep evaluation if you snore a lot or have excessive sleepiness.  Take medicines only as directed by your health care provider.  For some people, aspirin or blood thinners (anticoagulants) are helpful in reducing the risk of forming abnormal blood clots that can lead to stroke. If you have the irregular heart rhythm of atrial fibrillation, you should be on a blood thinner unless there is a good reason you cannot take them.  Understand all your medicine instructions.  Make sure that other conditions (such as anemia or atherosclerosis) are addressed. SEEK IMMEDIATE MEDICAL CARE IF:   You have sudden weakness or numbness of the face, arm, or leg, especially on one side of the body.  Your face or eyelid droops to one side.  You have sudden confusion.  You have trouble speaking (aphasia) or understanding.  You have sudden trouble seeing in one or both eyes.  You have sudden trouble walking.  You have dizziness.  You have a loss of balance or coordination.  You have a sudden, severe headache with no known cause.  You have new chest pain or an irregular heartbeat. Any of these symptoms may represent a serious problem that is an emergency. Do not wait to see if the symptoms will go away. Get medical help at once. Call your local emergency services (911 in [place] Do not drive yourself to the hospital. Document Released: 05/25/2004 Document Revised: 09/01/2013 Document Reviewed: 10/18/2012 [employer] Patient Information 2015 [employer]. This information is not intended to replace advice given to you by your health care provider. Make sure you discuss any questions you have with your health care provider.  

## 2015-01-25 NOTE — Progress Notes (Signed)
[facility] [place] HISTORY AND PHYSICAL   MRN : [id number]  History of Present Illness:   Gary [name] is a 59 y.o. male patient of Dr. 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 He has a history of an aortobifemoral bypass graft in 2010. He stepped on a nail while in [place] and this got infected. This was the reason for his toe amputation. He has a history of a right BKA amputation. He did require wet-to-dry dressing changes temporarily. His wound is now completely healed. He denies any issues with his right BKA stump, denies non healing wounds in his left lower extremity, denies claudication symptoms in left leg with walking. He walks well with his prosthesis.  He reports walking about 30 minutes + daily. He reports a long history of chronic cough, denies dyspnea.  Pt states he forgot to take his blood pressure medication today, states his home blood pressures usually runs [id number] systolic. He states he will take his blood pressure medications as soon as he gets home.   He denies any history of stroke or TIA.  The patient denies New Medical or Surgical History. He states he is physically active and does not use ETOH.  Pt Diabetic: Yes, states in control Pt smoker: smoker (1/2 ppd, started at age 37 yrs)  Pt meds include: Statin :Yes ASA: Yes Other anticoagulants/antiplatelets: no     Current Outpatient Prescriptions  Medication Sig Dispense Refill  . aspirin EC 81 MG tablet Take 81 mg by mouth daily.    . calcium carbonate (TUMS EX) 750 MG chewable tablet Chew 2 tablets by mouth every 4 (four) hours as needed for heartburn.    . Cholecalciferol (VITAMIN D-3) 1000 UNITS CAPS Take 1,000 Units by mouth daily.    Marland Kitchen docusate sodium (COLACE) 100 MG capsule Take 100 mg by mouth at bedtime as needed for constipation.    Marland Kitchen glimepiride (AMARYL) 1 MG tablet Take 1 mg by mouth daily.    . insulin glargine (LANTUS) 100 UNIT/ML injection  Inject 40 Units into the skin at bedtime.     Marland Kitchen lisinopril (PRINIVIL,ZESTRIL) 5 MG tablet Take 10 mg by mouth daily.     . metFORMIN (GLUCOPHAGE) 500 MG tablet Take 1,000 mg by mouth 2 (two) times daily with a meal.     . omeprazole (PRILOSEC) 20 MG capsule Take 20 mg by mouth daily.    . rosuvastatin (CRESTOR) 20 MG tablet Take 20 mg by mouth daily.    Marland Kitchen zolpidem (AMBIEN) 5 MG tablet Take 5 mg by mouth at bedtime as needed for sleep.    Marland Kitchen oxyCODONE-acetaminophen (PERCOCET/ROXICET) 5-325 MG per tablet Take 1-2 tablets by mouth every 4 (four) hours as needed. (Patient not taking: Reported on 01/25/2015) 40 tablet 0   No current facility-administered medications for this visit.    Past Medical History  Diagnosis Date  . Hypertension   . Diabetes mellitus   . High cholesterol   . Blurred vision   . Insomnia   . Peripheral artery disease   . GERD (gastroesophageal reflux disease)   . Constipation   . Sensorineural hearing loss     left ear  . Port-wine stain of face     Left V1 distribution, including upper eyelid  . Vitamin D deficiency     Social History Social History  Substance Use Topics  . Smoking status: Heavy Tobacco Smoker -- 1.00 packs/day for 30 years    Types:  Cigarettes  . Smokeless tobacco: Never Used     Comment: taking chantix. down from 2 packs/day  . Alcohol Use: No    Family History Family History  Problem Relation Age of Onset  . Diabetes Mother   . Hypertension Mother   . Cancer Father   . Colon cancer Neg Hx     Surgical History Past Surgical History  Procedure Laterality Date  . Amputation Right 07/17/2011  . Peripheral arterial stent graft N/A 2010  . Amputation Left 10/03/2012    Procedure: AMPUTATION DIGIT;  Surgeon: Serafina Mitchell, MD;  Location: [facility] OR;  Service: Vascular;  Laterality: Left;  Amputation of left Great  toe    Allergies  Allergen Reactions  . Oxycodone-Acetaminophen Rash    Macular papular rash after two days on Oxy/APAP  resolved with d/c of drug.    Current Outpatient Prescriptions  Medication Sig Dispense Refill  . aspirin EC 81 MG tablet Take 81 mg by mouth daily.    . calcium carbonate (TUMS EX) 750 MG chewable tablet Chew 2 tablets by mouth every 4 (four) hours as needed for heartburn.    . Cholecalciferol (VITAMIN D-3) 1000 UNITS CAPS Take 1,000 Units by mouth daily.    Marland Kitchen docusate sodium (COLACE) 100 MG capsule Take 100 mg by mouth at bedtime as needed for constipation.    Marland Kitchen glimepiride (AMARYL) 1 MG tablet Take 1 mg by mouth daily.    . insulin glargine (LANTUS) 100 UNIT/ML injection Inject 40 Units into the skin at bedtime.     Marland Kitchen lisinopril (PRINIVIL,ZESTRIL) 5 MG tablet Take 10 mg by mouth daily.     . metFORMIN (GLUCOPHAGE) 500 MG tablet Take 1,000 mg by mouth 2 (two) times daily with a meal.     . omeprazole (PRILOSEC) 20 MG capsule Take 20 mg by mouth daily.    . rosuvastatin (CRESTOR) 20 MG tablet Take 20 mg by mouth daily.    Marland Kitchen zolpidem (AMBIEN) 5 MG tablet Take 5 mg by mouth at bedtime as needed for sleep.    Marland Kitchen oxyCODONE-acetaminophen (PERCOCET/ROXICET) 5-325 MG per tablet Take 1-2 tablets by mouth every 4 (four) hours as needed. (Patient not taking: Reported on 01/25/2015) 40 tablet 0   No current facility-administered medications for this visit.     REVIEW OF SYSTEMS: See HPI for pertinent positives and negatives.  Physical Examination Filed Vitals:   01/25/15 1137 01/25/15 1145  BP: 195/90 195/87  Pulse: 65 65  Temp: 97.4 F (36.3 C)   TempSrc: Oral   Resp: 16   Height: 5' 7.5" (1.715 m)   Weight: 167 lb (75.751 kg)    Body mass index is 25.75 kg/(m^2).  General: A&O x 3, WDWN. Gait: normal Eyes: PERRLA. Pulmonary: CTAB, without wheezes , rales or rhonchi. Cardiac: regular Rythm , without detected murmur.     Carotid Bruits Right Left   Negative negative  Aorta is not palpable. Radial pulses: 2+ palpable and =   VASCULAR  EXAM: Extremities without ischemic changes  without Gangrene; without open wounds. Wearing right BKA prosthesis. Left great toe surgically absent.     LE Pulses Right Left   FEMORAL not palpable not palpable    POPLITEAL not palpable  not palpable   POSTERIOR TIBIAL BKA  not palpable    DORSALIS PEDIS  ANTERIOR TIBIAL BKA  not palpable    Abdomen: soft, NT, no palpable masses. Skin: no rashes, no ulcers. Musculoskeletal: no muscle wasting or atrophy. Neurologic: A&O  X 3; Appropriate Affect ; SENSATION: normal; MOTOR FUNCTION: moving all extremities equally, motor strength 5/5 throughout. Speech is fluent/normal. CN 2-12 intact.          Non-Invasive Vascular Imaging (01/25/2015):  Carotid Duplex: <40% stenosis of the bilateral internal carotid arteries. Right vertebral artery is antegrade, left cannot be visualized. No significant change compared to prior exam on 01/19/14.   ABI (Date: 01/25/2015)  R: BKA (N/A)  L: 0.73 (0.85, 12/01/13), DP: monophasic, PT: , monophasic waveforms TBI: great toe is surgically absent     ASSESSMENT:  Gary [name] is a 59 y.o. male who is status post left great toe amputation on 10/03/2012. He has a history of an aortobifemoral bypass graft in 2010. He stepped on a nail while in [place] and this got infected. This was the reason for his toe amputation. He denies any issues with his right BKA stump, denies non healing wounds in his left lower extremity, denies claudication symptoms in left leg with walking. He walks well with his prosthesis.  He has no signs of ischemia in his left foot. Left ABI: left PTA is non compressible and monophasic, left DPA is monophasic with evidence of moderate arterial occlusive disease.  He has no history of stroke  or TIA, but does have DM and smokes.    PLAN:   The patient was counseled re smoking cessation and given several free resources re smoking cessation.  Based on today's exam and non-invasive vascular lab results, the patient will follow up in 1 year with the following tests: ABI's and carotid duplex.  I discussed in depth with the patient the nature of atherosclerosis, and emphasized the importance of maximal medical management including strict control of blood pressure, blood glucose, and lipid levels, obtaining regular exercise, and cessation of smoking.  The patient is aware that without maximal medical management the underlying atherosclerotic disease process will progress, limiting the benefit of any interventions.  The patient was given information about stroke prevention and what symptoms should prompt the patient to seek immediate medical care. . The patient was given information about PAD including signs, symptoms, treatment, what symptoms should prompt the patient to seek immediate medical care, and risk reduction measures to take. Thank you for allowing [place] to participate in this patient's care.  Clemon Chambers, RN, MSN, FNP-C [facility] Office: [phone]  Clinic MD: Trula Slade  01/25/2015 12:09 PM

## 2016-01-27 ENCOUNTER — Encounter (HOSPITAL_COMMUNITY): Payer: Self-pay | Admitting: *Deleted

## 2016-01-27 ENCOUNTER — Other Ambulatory Visit (HOSPITAL_COMMUNITY): Payer: Self-pay | Admitting: Family

## 2016-01-27 NOTE — Progress Notes (Signed)
Pt denies cardiac history, chest pain or sob. Pt is diabetic. He states his A1C was drawn recently but he doesn't remember what it was, states he gets it done at [facility]. Pt states fasting blood sugar usually runs between 130-140. Pt instructed to take 1/2 of his regular dose of Lantus in the AM. Also instructed him to check his blood sugar when he gets up in the AM and every 2 hours prior to leaving for hospital. If blood sugar is 70 or below, treat with 1/2 cup of clear juice (apple or cranberry) and recheck blood sugar 15 minutes after drinking juice. If blood sugar continues to be 70 or below, call the Short Stay department and ask to speak to a nurse. Pt voiced understanding.

## 2016-01-28 ENCOUNTER — Ambulatory Visit (HOSPITAL_COMMUNITY): Payer: Medicare (Managed Care) | Admitting: Certified Registered Nurse Anesthetist

## 2016-01-28 ENCOUNTER — Encounter (HOSPITAL_COMMUNITY)
Admission: RE | Disposition: A | Discharge status: Home / Self Care | Payer: Self-pay | Source: Ambulatory Visit | Attending: Orthopedic Surgery

## 2016-01-28 ENCOUNTER — Ambulatory Visit (HOSPITAL_COMMUNITY)
Admission: RE | Admit: 2016-01-28 | Discharge: 2016-01-29 | Disposition: A | Discharge status: Home / Self Care | Payer: Medicare (Managed Care) | Source: Ambulatory Visit | Attending: Orthopedic Surgery | Admitting: Orthopedic Surgery

## 2016-01-28 ENCOUNTER — Encounter (HOSPITAL_COMMUNITY): Payer: Self-pay | Admitting: Surgery

## 2016-01-28 DIAGNOSIS — X58XXXA Exposure to other specified factors, initial encounter: Secondary | ICD-10-CM | POA: Insufficient documentation

## 2016-01-28 DIAGNOSIS — H905 Unspecified sensorineural hearing loss: Secondary | ICD-10-CM | POA: Insufficient documentation

## 2016-01-28 DIAGNOSIS — F17210 Nicotine dependence, cigarettes, uncomplicated: Secondary | ICD-10-CM | POA: Insufficient documentation

## 2016-01-28 DIAGNOSIS — L97919 Non-pressure chronic ulcer of unspecified part of right lower leg with unspecified severity: Secondary | ICD-10-CM | POA: Diagnosis not present

## 2016-01-28 DIAGNOSIS — K219 Gastro-esophageal reflux disease without esophagitis: Secondary | ICD-10-CM | POA: Diagnosis not present

## 2016-01-28 DIAGNOSIS — T8781 Dehiscence of amputation stump: Secondary | ICD-10-CM | POA: Insufficient documentation

## 2016-01-28 DIAGNOSIS — Z794 Long term (current) use of insulin: Secondary | ICD-10-CM | POA: Insufficient documentation

## 2016-01-28 DIAGNOSIS — E1140 Type 2 diabetes mellitus with diabetic neuropathy, unspecified: Secondary | ICD-10-CM | POA: Insufficient documentation

## 2016-01-28 DIAGNOSIS — E1151 Type 2 diabetes mellitus with diabetic peripheral angiopathy without gangrene: Secondary | ICD-10-CM | POA: Diagnosis not present

## 2016-01-28 DIAGNOSIS — E559 Vitamin D deficiency, unspecified: Secondary | ICD-10-CM | POA: Diagnosis not present

## 2016-01-28 DIAGNOSIS — I10 Essential (primary) hypertension: Secondary | ICD-10-CM | POA: Insufficient documentation

## 2016-01-28 DIAGNOSIS — Z89511 Acquired absence of right leg below knee: Secondary | ICD-10-CM

## 2016-01-28 DIAGNOSIS — Z79899 Other long term (current) drug therapy: Secondary | ICD-10-CM | POA: Insufficient documentation

## 2016-01-28 DIAGNOSIS — Z7982 Long term (current) use of aspirin: Secondary | ICD-10-CM | POA: Diagnosis not present

## 2016-01-28 DIAGNOSIS — E7800 Pure hypercholesterolemia, unspecified: Secondary | ICD-10-CM | POA: Insufficient documentation

## 2016-01-28 DIAGNOSIS — M869 Osteomyelitis, unspecified: Secondary | ICD-10-CM | POA: Insufficient documentation

## 2016-01-28 DIAGNOSIS — IMO0002 Reserved for concepts with insufficient information to code with codable children: Secondary | ICD-10-CM

## 2016-01-28 HISTORY — PX: STUMP REVISION: SHX6102

## 2016-01-28 LAB — GLUCOSE, CAPILLARY
GLUCOSE-CAPILLARY: 126 mg/dL — AB (ref 65–99)
GLUCOSE-CAPILLARY: 104 mg/dL — AB (ref 65–99)
GLUCOSE-CAPILLARY: 63 mg/dL — AB (ref 65–99)
GLUCOSE-CAPILLARY: 127 mg/dL — AB (ref 65–99)
Glucose-Capillary: 136 mg/dL — ABNORMAL HIGH (ref 65–99)
Glucose-Capillary: 52 mg/dL — ABNORMAL LOW (ref 65–99)

## 2016-01-28 LAB — BASIC METABOLIC PANEL
Anion gap: 8 (ref 5–15)
BUN: 27 mg/dL — ABNORMAL HIGH (ref 6–20)
CO2: 18 mmol/L — ABNORMAL LOW (ref 22–32)
Calcium: 8.9 mg/dL (ref 8.9–10.3)
Chloride: 112 mmol/L — ABNORMAL HIGH (ref 101–111)
Creatinine, Ser: 2.11 mg/dL — ABNORMAL HIGH (ref 0.61–1.24)
GFR calc Af Amer: 38 mL/min — ABNORMAL LOW (ref >60)
GFR calc non Af Amer: 33 mL/min — ABNORMAL LOW (ref >60)
Glucose, Bld: 120 mg/dL — ABNORMAL HIGH (ref 65–99)
Potassium: 5 mmol/L (ref 3.5–5.1)
Sodium: 138 mmol/L (ref 135–145)

## 2016-01-28 LAB — CBC
HCT: 42.7 % (ref 39.0–52.0)
Hemoglobin: 14.3 g/dL (ref 13.0–17.0)
MCH: 26.2 pg (ref 26.0–34.0)
MCHC: 33.5 g/dL (ref 30.0–36.0)
MCV: 78.3 fL (ref 78.0–100.0)
PLATELETS: 318 10*3/uL (ref 150–400)
RBC: 5.45 MIL/uL (ref 4.22–5.81)
RDW: 12.9 % (ref 11.5–15.5)
WBC: 9.8 10*3/uL (ref 4.0–10.5)

## 2016-01-28 SURGERY — REVISION, AMPUTATION SITE
Anesthesia: General | Laterality: Right

## 2016-01-28 MED ORDER — ONDANSETRON HCL 4 MG/2ML IJ SOLN: 4.0000 mg | Freq: Four times a day (QID) | INTRAMUSCULAR | Status: DC | PRN

## 2016-01-28 MED ORDER — SODIUM CHLORIDE 0.9 % IV SOLN: INTRAVENOUS | Status: DC

## 2016-01-28 MED ORDER — INSULIN ASPART 100 UNIT/ML ~~LOC~~ SOLN
4.0000 [IU] | Freq: Three times a day (TID) | SUBCUTANEOUS | Status: DC
  Administered 2016-01-29: 4 [IU] via SUBCUTANEOUS

## 2016-01-28 MED ORDER — LIDOCAINE HCL (CARDIAC) 20 MG/ML IV SOLN
INTRAVENOUS | Status: DC | PRN
  Administered 2016-01-28: 20 mg via INTRATRACHEAL

## 2016-01-28 MED ORDER — MIDAZOLAM HCL 2 MG/2ML IJ SOLN
INTRAMUSCULAR | Status: AC
  Filled 2016-01-28: qty 2

## 2016-01-28 MED ORDER — PROMETHAZINE HCL 25 MG/ML IJ SOLN
6.2500 mg | INTRAMUSCULAR | Status: DC | PRN
Start: 2016-01-28 — End: 2016-01-28

## 2016-01-28 MED ORDER — METOPROLOL SUCCINATE ER 100 MG PO TB24
100.0000 mg | ORAL_TABLET | Freq: Once | ORAL | Status: AC
  Administered 2016-01-28: 100 mg via ORAL
  Filled 2016-01-28: qty 1

## 2016-01-28 MED ORDER — HYDROMORPHONE HCL 1 MG/ML IJ SOLN
1.0000 mg | INTRAMUSCULAR | Status: DC | PRN
  Administered 2016-01-28: 1 mg via INTRAVENOUS
  Filled 2016-01-28: qty 1

## 2016-01-28 MED ORDER — GLUCOSE 40 % PO GEL
ORAL | Status: AC
  Administered 2016-01-28: 37.5 g
  Filled 2016-01-28: qty 1

## 2016-01-28 MED ORDER — METOPROLOL TARTRATE 50 MG PO TABS
ORAL_TABLET | ORAL | Status: AC
  Filled 2016-01-28: qty 2

## 2016-01-28 MED ORDER — ACETAMINOPHEN 650 MG RE SUPP: 650.0000 mg | Freq: Four times a day (QID) | RECTAL | Status: DC | PRN

## 2016-01-28 MED ORDER — METHOCARBAMOL 500 MG PO TABS
500.0000 mg | ORAL_TABLET | Freq: Four times a day (QID) | ORAL | Status: DC | PRN
Start: 2016-01-28 — End: 2016-01-29
  Administered 2016-01-28 – 2016-01-29 (×2): 500 mg via ORAL
  Filled 2016-01-28 (×2): qty 1

## 2016-01-28 MED ORDER — ACETAMINOPHEN 325 MG PO TABS: 650.0000 mg | ORAL_TABLET | Freq: Four times a day (QID) | ORAL | Status: DC | PRN

## 2016-01-28 MED ORDER — CLONIDINE HCL 0.3 MG/24HR TD PTWK
0.3000 mg | MEDICATED_PATCH | TRANSDERMAL | Status: DC
  Administered 2016-01-28: 0.3 mg via TRANSDERMAL
  Filled 2016-01-28: qty 1

## 2016-01-28 MED ORDER — CHLORHEXIDINE GLUCONATE 4 % EX LIQD: 60.0000 mL | Freq: Once | CUTANEOUS | Status: DC

## 2016-01-28 MED ORDER — SENNA 8.6 MG PO TABS
1.0000 | ORAL_TABLET | Freq: Two times a day (BID) | ORAL | Status: DC
Start: 2016-01-28 — End: 2016-01-29
  Administered 2016-01-28 – 2016-01-29 (×2): 8.6 mg via ORAL
  Filled 2016-01-28 (×2): qty 1

## 2016-01-28 MED ORDER — ASPIRIN EC 81 MG PO TBEC
81.0000 mg | DELAYED_RELEASE_TABLET | Freq: Every day | ORAL | Status: DC
  Administered 2016-01-28 – 2016-01-29 (×2): 81 mg via ORAL
  Filled 2016-01-28 (×2): qty 1

## 2016-01-28 MED ORDER — HYDROCHLOROTHIAZIDE 12.5 MG PO CAPS
12.5000 mg | ORAL_CAPSULE | Freq: Every day | ORAL | Status: DC
  Administered 2016-01-29: 12.5 mg via ORAL
  Filled 2016-01-28: qty 1

## 2016-01-28 MED ORDER — FENTANYL CITRATE (PF) 100 MCG/2ML IJ SOLN
INTRAMUSCULAR | Status: DC | PRN
  Administered 2016-01-28 (×2): 100 ug via INTRAVENOUS

## 2016-01-28 MED ORDER — ACETAMINOPHEN 10 MG/ML IV SOLN
1000.0000 mg | Freq: Once | INTRAVENOUS | Status: AC
  Administered 2016-01-28: 1000 mg via INTRAVENOUS

## 2016-01-28 MED ORDER — ACETAMINOPHEN 10 MG/ML IV SOLN
INTRAVENOUS | Status: AC
  Filled 2016-01-28: qty 100

## 2016-01-28 MED ORDER — BUPIVACAINE-EPINEPHRINE (PF) 0.5% -1:200000 IJ SOLN
INTRAMUSCULAR | Status: DC | PRN
  Administered 2016-01-28: 25 mL via PERINEURAL

## 2016-01-28 MED ORDER — HYDROMORPHONE HCL 1 MG/ML IJ SOLN
INTRAMUSCULAR | Status: AC
  Administered 2016-01-28: 0.5 mg via INTRAVENOUS
  Filled 2016-01-28: qty 1

## 2016-01-28 MED ORDER — LISINOPRIL 20 MG PO TABS
20.0000 mg | ORAL_TABLET | Freq: Every day | ORAL | Status: DC
  Administered 2016-01-29: 20 mg via ORAL
  Filled 2016-01-28: qty 1

## 2016-01-28 MED ORDER — INSULIN ASPART 100 UNIT/ML ~~LOC~~ SOLN
0.0000 [IU] | Freq: Three times a day (TID) | SUBCUTANEOUS | Status: DC
  Administered 2016-01-29: 2 [IU] via SUBCUTANEOUS

## 2016-01-28 MED ORDER — METHOCARBAMOL 1000 MG/10ML IJ SOLN
500.0000 mg | Freq: Four times a day (QID) | INTRAVENOUS | Status: DC | PRN
  Filled 2016-01-28: qty 5

## 2016-01-28 MED ORDER — HYDROMORPHONE HCL 1 MG/ML IJ SOLN
0.2500 mg | INTRAMUSCULAR | Status: DC | PRN
  Administered 2016-01-28 (×4): 0.5 mg via INTRAVENOUS

## 2016-01-28 MED ORDER — GLIMEPIRIDE 4 MG PO TABS
4.0000 mg | ORAL_TABLET | Freq: Every day | ORAL | Status: DC
  Administered 2016-01-29: 4 mg via ORAL
  Filled 2016-01-28: qty 1

## 2016-01-28 MED ORDER — HYDROMORPHONE HCL 1 MG/ML IJ SOLN: 0.2500 mg | INTRAMUSCULAR | Status: DC | PRN

## 2016-01-28 MED ORDER — LACTATED RINGERS IV SOLN
INTRAVENOUS | Status: DC
Start: 2016-01-28 — End: 2016-01-28
  Administered 2016-01-28: 12:00:00 via INTRAVENOUS

## 2016-01-28 MED ORDER — METOCLOPRAMIDE HCL 5 MG PO TABS: 5.0000 mg | ORAL_TABLET | Freq: Three times a day (TID) | ORAL | Status: DC | PRN

## 2016-01-28 MED ORDER — HYDRALAZINE HCL 25 MG PO TABS
100.0000 mg | ORAL_TABLET | Freq: Three times a day (TID) | ORAL | Status: DC
  Administered 2016-01-28 – 2016-01-29 (×2): 100 mg via ORAL
  Filled 2016-01-28 (×2): qty 4

## 2016-01-28 MED ORDER — PHENYLEPHRINE HCL 10 MG/ML IJ SOLN
INTRAMUSCULAR | Status: DC | PRN
  Administered 2016-01-28: 80 ug via INTRAVENOUS

## 2016-01-28 MED ORDER — METOPROLOL SUCCINATE ER 100 MG PO TB24
100.0000 mg | ORAL_TABLET | Freq: Every day | ORAL | Status: DC
  Administered 2016-01-28 – 2016-01-29 (×2): 100 mg via ORAL
  Filled 2016-01-28 (×2): qty 1

## 2016-01-28 MED ORDER — FENTANYL CITRATE (PF) 100 MCG/2ML IJ SOLN
INTRAMUSCULAR | Status: AC
  Filled 2016-01-28: qty 2

## 2016-01-28 MED ORDER — HYDROMORPHONE HCL 1 MG/ML IJ SOLN
INTRAMUSCULAR | Status: AC
  Administered 2016-01-28: 1 mg via INTRAVENOUS
  Filled 2016-01-28: qty 1

## 2016-01-28 MED ORDER — CEFAZOLIN IN D5W 1 GM/50ML IV SOLN
1.0000 g | Freq: Four times a day (QID) | INTRAVENOUS | Status: AC
  Administered 2016-01-28 – 2016-01-29 (×3): 1 g via INTRAVENOUS
  Filled 2016-01-28 (×4): qty 50

## 2016-01-28 MED ORDER — DIPHENHYDRAMINE HCL 25 MG PO CAPS
25.0000 mg | ORAL_CAPSULE | Freq: Four times a day (QID) | ORAL | Status: DC | PRN
  Administered 2016-01-28: 25 mg via ORAL
  Filled 2016-01-28: qty 1

## 2016-01-28 MED ORDER — ONDANSETRON HCL 4 MG/2ML IJ SOLN
INTRAMUSCULAR | Status: DC | PRN
  Administered 2016-01-28: 4 mg via INTRAVENOUS

## 2016-01-28 MED ORDER — PANTOPRAZOLE SODIUM 40 MG PO TBEC
40.0000 mg | DELAYED_RELEASE_TABLET | Freq: Every day | ORAL | Status: DC
  Administered 2016-01-28 – 2016-01-29 (×2): 40 mg via ORAL
  Filled 2016-01-28 (×2): qty 1

## 2016-01-28 MED ORDER — METOCLOPRAMIDE HCL 5 MG/ML IJ SOLN: 5.0000 mg | Freq: Three times a day (TID) | INTRAMUSCULAR | Status: DC | PRN

## 2016-01-28 MED ORDER — INSULIN GLARGINE 100 UNIT/ML ~~LOC~~ SOLN
25.0000 [IU] | Freq: Every day | SUBCUTANEOUS | Status: DC
  Filled 2016-01-28: qty 0.25

## 2016-01-28 MED ORDER — 0.9 % SODIUM CHLORIDE (POUR BTL) OPTIME
TOPICAL | Status: DC | PRN
  Administered 2016-01-28: 1000 mL

## 2016-01-28 MED ORDER — LISINOPRIL-HYDROCHLOROTHIAZIDE 20-12.5 MG PO TABS: 1.0000 | ORAL_TABLET | Freq: Every day | ORAL | Status: DC

## 2016-01-28 MED ORDER — MEPERIDINE HCL 25 MG/ML IJ SOLN
6.2500 mg | INTRAMUSCULAR | Status: DC | PRN
Start: 2016-01-28 — End: 2016-01-28

## 2016-01-28 MED ORDER — CEFAZOLIN SODIUM-DEXTROSE 2-4 GM/100ML-% IV SOLN
2.0000 g | INTRAVENOUS | Status: AC
  Administered 2016-01-28: 2 g via INTRAVENOUS

## 2016-01-28 MED ORDER — CEFAZOLIN SODIUM-DEXTROSE 2-4 GM/100ML-% IV SOLN
INTRAVENOUS | Status: AC
Start: 2016-01-28 — End: 2016-01-28
  Filled 2016-01-28: qty 100

## 2016-01-28 MED ORDER — ONDANSETRON HCL 4 MG PO TABS: 4.0000 mg | ORAL_TABLET | Freq: Four times a day (QID) | ORAL | Status: DC | PRN

## 2016-01-28 MED ORDER — AMLODIPINE BESYLATE 10 MG PO TABS
10.0000 mg | ORAL_TABLET | Freq: Every day | ORAL | Status: DC
  Administered 2016-01-28 – 2016-01-29 (×2): 10 mg via ORAL
  Filled 2016-01-28 (×2): qty 1

## 2016-01-28 MED ORDER — ONDANSETRON HCL 4 MG/2ML IJ SOLN
INTRAMUSCULAR | Status: AC
  Filled 2016-01-28: qty 2

## 2016-01-28 MED ORDER — MIDAZOLAM HCL 2 MG/2ML IJ SOLN
0.5000 mg | Freq: Once | INTRAMUSCULAR | Status: AC | PRN
  Administered 2016-01-28: 1 mg via INTRAVENOUS

## 2016-01-28 MED ORDER — HYDROCODONE-ACETAMINOPHEN 5-325 MG PO TABS
1.0000 | ORAL_TABLET | ORAL | Status: DC | PRN
  Administered 2016-01-28 – 2016-01-29 (×4): 2 via ORAL
  Filled 2016-01-28 (×4): qty 2

## 2016-01-28 SURGICAL SUPPLY — 26 items
BLADE SURG 21 STRL SS (BLADE) ×3 IMPLANT
COVER SURGICAL LIGHT HANDLE (MISCELLANEOUS) ×3 IMPLANT
DRAPE EXTREMITY T 121X128X90 (DRAPE) ×3 IMPLANT
DRAPE INCISE IOBAN 66X45 STRL (DRAPES) ×3 IMPLANT
DRAPE PROXIMA HALF (DRAPES) ×3 IMPLANT
DRAPE U-SHAPE 47X51 STRL (DRAPES) ×3 IMPLANT
DRSG VAC ATS MED SENSATRAC (GAUZE/BANDAGES/DRESSINGS) ×3 IMPLANT
DURAPREP 26ML APPLICATOR (WOUND CARE) ×3 IMPLANT
ELECT REM PT RETURN 9FT ADLT (ELECTROSURGICAL) ×3
ELECTRODE REM PT RTRN 9FT ADLT (ELECTROSURGICAL) ×1 IMPLANT
GLOVE BIOGEL PI IND STRL 9 (GLOVE) ×1 IMPLANT
GLOVE BIOGEL PI INDICATOR 9 (GLOVE) ×2
GLOVE SURG ORTHO 9.0 STRL STRW (GLOVE) ×3 IMPLANT
GOWN STRL REUS W/ TWL XL LVL3 (GOWN DISPOSABLE) ×2 IMPLANT
GOWN STRL REUS W/TWL XL LVL3 (GOWN DISPOSABLE) ×6
KIT BASIN OR (CUSTOM PROCEDURE TRAY) ×3 IMPLANT
KIT PREVENA INCISION MGT20CM45 (CANNISTER) ×3 IMPLANT
KIT ROOM TURNOVER OR (KITS) ×3 IMPLANT
MANIFOLD NEPTUNE II (INSTRUMENTS) ×3 IMPLANT
NS IRRIG 1000ML POUR BTL (IV SOLUTION) ×3 IMPLANT
PACK GENERAL/GYN (CUSTOM PROCEDURE TRAY) ×3 IMPLANT
PAD ARMBOARD 7.5X6 YLW CONV (MISCELLANEOUS) ×3 IMPLANT
PAD NEG PRESSURE SENSATRAC (MISCELLANEOUS) IMPLANT
STOCKINETTE IMPERVIOUS LG (DRAPES) ×3 IMPLANT
SUT ETHILON 2 0 PSLX (SUTURE) ×6 IMPLANT
TOWEL OR 17X26 10 PK STRL BLUE (TOWEL DISPOSABLE) ×3 IMPLANT

## 2016-01-28 NOTE — Anesthesia Procedure Notes (Signed)
Procedure Name: LMA Insertion Date/Time: 01/28/2016 1:38 PM Performed by: Mariea Clonts Pre-anesthesia Checklist: Patient identified, Emergency Drugs available, Suction available and Patient being monitored Patient Re-evaluated:Patient Re-evaluated prior to inductionOxygen Delivery Method: Circle System Utilized Preoxygenation: Pre-oxygenation with 100% oxygen Intubation Type: IV induction Ventilation: Mask ventilation without difficulty LMA: LMA inserted LMA Size: 4.0 Number of attempts: 1 Airway Equipment and Method: Bite block Placement Confirmation: positive ETCO2 Tube secured with: Tape Dental Injury: Teeth and Oropharynx as per pre-operative assessment

## 2016-01-28 NOTE — Anesthesia Procedure Notes (Signed)
Anesthesia Regional Block:  Femoral nerve block  Pre-Anesthetic Checklist: ,, timeout performed, Correct Patient, Correct Site, Correct Laterality, Correct Procedure, Correct Position, site marked, Risks and benefits discussed,  Surgical consent,  Pre-op evaluation,  At surgeon's request and post-op pain management  Laterality: Right and Lower  Prep: chloraprep       Needles:   Needle Type: Stimulator Needle - 40     Needle Length: 4cm 4 cm Needle Gauge: 22 and 22 G    Additional Needles:  Procedures: nerve stimulator Femoral nerve block  Nerve Stimulator or Paresthesia:  Response: quad/patella twitch, 0.4 mA, 0.1 ms,   Additional Responses:   Narrative:  Start time: 01/28/2016 1:20 PM End time: 01/28/2016 1:26 PM Injection made incrementally with aspirations every 5 mL.  Performed by: Personally  Anesthesiologist: Glennon Mac, Levelle Edelen  Additional Notes: Pt identified in Holding room.  Monitors applied. Working IV access confirmed. Sterile prep R groin.  #22ga PNS to patella twitch at 0.65mA threshold.  25 cc 0.5% Bupivacaine with 1:200k epi injected incrementally after negative test dose.  Patient asymptomatic, VSS, no heme aspirated, tolerated well.  Jenita Seashore, MD

## 2016-01-28 NOTE — Anesthesia Preprocedure Evaluation (Addendum)
Anesthesia Evaluation  Patient identified by MRN, date of birth, ID band Patient awake    Reviewed: Allergy & Precautions, NPO status , Patient's Chart, lab work & pertinent test results, reviewed documented beta blocker date and time   History of Anesthesia Complications Negative for: history of anesthetic complications  Airway Mallampati: II  TM Distance: >3 FB Neck ROM: Full    Dental  (+) Missing, Edentulous Upper, Dental Advisory Given   Pulmonary Current Smoker,    breath sounds clear to auscultation       Cardiovascular hypertension, Pt. on medications and Pt. on home beta blockers + Peripheral Vascular Disease   Rhythm:Regular Rate:Normal     Neuro/Psych negative neurological ROS     GI/Hepatic Neg liver ROS, GERD  Medicated and Controlled,  Endo/Other  diabetes (glu 120), Insulin Dependent, Oral Hypoglycemic Agents  Renal/GU Renal InsufficiencyRenal disease (creat 2.11)     Musculoskeletal   Abdominal   Peds  Hematology   Anesthesia Other Findings   Reproductive/Obstetrics                            Anesthesia Physical Anesthesia Plan  ASA: III  Anesthesia Plan: General   Post-op Pain Management: GA combined w/ Regional for post-op pain   Induction: Intravenous  Airway Management Planned: LMA  Additional Equipment:   Intra-op Plan:   Post-operative Plan:   Informed Consent: I have reviewed the patients History and Physical, chart, labs and discussed the procedure including the risks, benefits and alternatives for the proposed anesthesia with the patient or authorized representative who has indicated his/her understanding and acceptance.   Dental advisory given  Plan Discussed with: CRNA and Surgeon  Anesthesia Plan Comments: (Plan routine monitors, GA- LMA OK, femoral nerve block for post op analgesia)        Anesthesia Quick Evaluation

## 2016-01-28 NOTE — Op Note (Signed)
      Date of Surgery: 01/28/2016  INDICATIONS: Mr. Marple is a 60 y.o.-year-old male who has osteomyelitis and ulcer right leg  .  PREOPERATIVE DIAGNOSIS: osteomyelitis abscess ulceration right leg  POSTOPERATIVE DIAGNOSIS: Same.  PROCEDURE: Transtibial amputation Application of Prevena wound VAC  SURGEON: Sharol Given, M.D.  ANESTHESIA:  general  IV FLUIDS AND URINE: See anesthesia.  ESTIMATED BLOOD LOSS: min mL.  COMPLICATIONS: None.  DESCRIPTION OF PROCEDURE: The patient was brought to the operating room and underwent a general anesthetic. After adequate levels of anesthesia were obtained patient's lower extremity was prepped using DuraPrep draped into a sterile field. A timeout was called. The foot was draped out of the sterile field with impervious stockinette. A transverse incision was made 11 cm distal to the tibial tubercle. This curved proximally and a large posterior flap was created. The tibia was transected 1 cm proximal to the skin incision. The fibula was transected just proximal to the tibial incision. The tibia was beveled anteriorly. A large posterior flap was created. The sciatic nerve was pulled cut and allowed to retract. The vascular bundles were suture ligated with 2-0 silk. The deep and superficial fascial layers were closed using #1 Vicryl. The skin was closed using staples and 2-0 nylon. The wound was covered with a Prevena wound VAC. There was a good suction fit. A prosthetic shrinker was applied. Patient was extubated taken to the PACU in stable condition.  Meridee Score, MD [place] 2:15 PM

## 2016-01-28 NOTE — Anesthesia Postprocedure Evaluation (Signed)
Anesthesia Post Note  Patient: Gary [name]  Procedure(s) Performed: Procedure(s) (LRB): Revision Right Below Knee Amputation (Right)  Patient location during evaluation: PACU Anesthesia Type: General and Regional Level of consciousness: sedated, oriented and patient cooperative Pain management: pain level controlled Vital Signs Assessment: post-procedure vital signs reviewed and stable Respiratory status: spontaneous breathing, nonlabored ventilation, respiratory function stable and patient connected to nasal cannula oxygen Cardiovascular status: blood pressure returned to baseline and stable Postop Assessment: no signs of nausea or vomiting Anesthetic complications: no    Last Vitals:  Vitals:   01/28/16 1537 01/28/16 1626  BP: 140/63   Pulse: 65   Resp: 13   Temp:  36.4 C    Last Pain:  Vitals:   01/28/16 1611  TempSrc:   PainSc: 8                  Gabriell Casimir,E. Laquandra Carrillo

## 2016-01-28 NOTE — H&P (Signed)
Gary [name] is an 60 y.o. male.   Chief Complaint: Dehiscence right transtibial amputation HPI: Patient is a 60 year old gentleman with diabetic insensate neuropathy peripheral vascular disease with dehiscence of the transtibial amputation failed conservative wound care.  Past Medical History:  Diagnosis Date  . Blurred vision   . Constipation   . Constipation   . Diabetes mellitus    type 2  . GERD (gastroesophageal reflux disease)   . High cholesterol   . Hypertension   . Insomnia   . Peripheral artery disease ([place])   . Port-wine stain of face    Left V1 distribution, including upper eyelid  . Sensorineural hearing loss    left ear  . Vitamin D deficiency     Past Surgical History:  Procedure Laterality Date  . AMPUTATION Right 07/17/2011  . AMPUTATION Left 10/03/2012   Procedure: AMPUTATION DIGIT;  Surgeon: Serafina Mitchell, MD;  Location: [facility] OR;  Service: Vascular;  Laterality: Left;  Amputation of left Great  toe  . COLONOSCOPY    . PERIPHERAL ARTERIAL STENT GRAFT N/A 2010    Family History  Problem Relation Age of Onset  . Diabetes Mother   . Hypertension Mother   . Cancer Father   . Colon cancer Neg Hx    Social History:  reports that he has been smoking Cigarettes.  He has a 15.00 pack-year smoking history. He has never used smokeless tobacco. He reports that he does not drink alcohol or use drugs.  Allergies:  Allergies  Allergen Reactions  . Oxycodone-Acetaminophen Rash    Macular papular rash after two days on Oxy/APAP resolved with d/c of drug.    No prescriptions prior to admission.    No results found for this or any previous visit (from the past 48 hour(s)). No results found.  Review of Systems  All other systems reviewed and are negative.   There were no vitals taken for this visit. Physical Exam  On examination patient has dehiscence ulceration dermatitis cellulitis right transtibial amputation. Assessment/Plan Assessment: Dehiscence  right transtibial amputation with diabetic insensate neuropathy and peripheral vascular disease.  Plan: We'll plan for revision of the transtibial amputation risk and benefits were discussed including infection neurovascular injury nonhealing the wound need for additional surgery. Patient states he understands wish to proceed at this time.  Newt Minion, MD 01/28/2016, 7:02 AM

## 2016-01-28 NOTE — Transfer of Care (Signed)
Immediate Anesthesia Transfer of Care Note  Patient: Gary [name]  Procedure(s) Performed: Procedure(s): Revision Right Below Knee Amputation (Right)  Patient Location: PACU  Anesthesia Type:GA combined with regional for post-op pain  Level of Consciousness: awake, alert  and oriented  Airway & Oxygen Therapy: Patient Spontanous Breathing and Patient connected to nasal cannula oxygen  Post-op Assessment: Report given to RN, Post -op Vital signs reviewed and stable and Patient moving all extremities X 4  Post vital signs: Reviewed and stable  Last Vitals:  Vitals:   01/28/16 1140 01/28/16 1422  BP: (!) 164/72 (!) 182/84  Pulse: 68 72  Resp: 18 12  Temp: 36.8 C     Last Pain:  Vitals:   01/28/16 1221  TempSrc:   PainSc: 6       Patients Stated Pain Goal: 6 (34/19/62 2297)  Complications: No apparent anesthesia complications

## 2016-01-28 NOTE — Transfer of Care (Signed)
Immediate Anesthesia Transfer of Care Note  Patient: Gary [name]  Procedure(s) Performed: Procedure(s): Revision Right Below Knee Amputation (Right)  Patient Location: PACU  Anesthesia Type:GA combined with regional for post-op pain  Level of Consciousness: awake, alert  and oriented  Airway & Oxygen Therapy: Patient Spontanous Breathing and Patient connected to nasal cannula oxygen  Post-op Assessment: Report given to RN and Post -op Vital signs reviewed and stable  Post vital signs: Reviewed and stable  Last Vitals:  Vitals:   01/28/16 1140 01/28/16 1422  BP: (!) 164/72 (!) 182/84  Pulse: 68 72  Resp: 18 12  Temp: 36.8 C     Last Pain:  Vitals:   01/28/16 1221  TempSrc:   PainSc: 6       Patients Stated Pain Goal: 6 (79/39/03 0092)  Complications: No apparent anesthesia complications

## 2016-01-28 NOTE — Progress Notes (Signed)
Hypoglycemic Event  CBG: 63  Treatment: 1 tube instant glucose  Symptoms: None  Follow-up CBG: Time: 1905 CBG Result:104  Possible Reasons for Event: Inadequate meal intake patient was NPO for surgery  Comments/MD notified:    Asa Lente

## 2016-01-29 DIAGNOSIS — T8781 Dehiscence of amputation stump: Secondary | ICD-10-CM | POA: Diagnosis not present

## 2016-01-29 LAB — GLUCOSE, CAPILLARY
GLUCOSE-CAPILLARY: 136 mg/dL — AB (ref 65–99)
GLUCOSE-CAPILLARY: 209 mg/dL — AB (ref 65–99)
Glucose-Capillary: 59 mg/dL — ABNORMAL LOW (ref 65–99)
Glucose-Capillary: 169 mg/dL — ABNORMAL HIGH (ref 65–99)
Glucose-Capillary: 54 mg/dL — ABNORMAL LOW (ref 65–99)
Glucose-Capillary: 67 mg/dL (ref 65–99)

## 2016-01-29 MED ORDER — HYDROCODONE-ACETAMINOPHEN 5-325 MG PO TABS: 1.0000 | ORAL_TABLET | Freq: Four times a day (QID) | ORAL | 0 refills | Status: DC | PRN

## 2016-01-29 MED ORDER — GLUCOSE 40 % PO GEL
ORAL | Status: AC
  Administered 2016-01-29: 37.5 g
  Filled 2016-01-29: qty 1

## 2016-01-29 NOTE — Progress Notes (Addendum)
01/29/16 @ 1420  CM received call from [place] and [employer] with [facility] who said that they decided to switch patient to [place] wound vac and will no longer need [place]. CM called Jermaine with [facility] to advise.   Maureen with [facility] to the bedside who said that wound care will be able to be addressed with wound vac at [facility] clinic on MWF. [facility] contracts with [place] and patient agreeable at this time despite previous statement of not wanting someone in the home. CM called Wife, Abigail Butts who was also agreeable to [facility] with [facility] and she said that she is able to come and pick the patient up when he is ready for discharge. CM called Jermaine with [facility] to advise of need for [facility] RN wound vac care. RW already delivered to the bedside. CM available for further discharge planning needs.

## 2016-01-29 NOTE — Discharge Summary (Signed)
Physician Discharge Summary  Patient ID: Gary [name] MRN: [id number] DOB/AGE: [birth year] 60 y.o.  Admit date: 01/28/2016 Discharge date: 01/29/2016  Admission Diagnoses:Osteomyelitis dehiscence transtibial amputation  Discharge Diagnoses:  Active Problems:   Below knee amputation status [facility])   Discharged Condition: stable  Hospital Course: Patient's hospital course was essentially unremarkable. He underwent revision of the transtibial amputation with application of wound VAC. Patient progressed well and was discharged to home in stable condition. [facility]  Consults: None  Significant Diagnostic Studies: labs: Routine labs  Treatments: surgery: See operative note  Discharge Exam: Blood pressure 119/70, pulse 66, temperature 98.6 F (37 C), temperature source Oral, resp. rate 16, height 5\' 8"  (1.727 m), weight 74.4 kg (164 lb), SpO2 99 %. Incision/Wound: Dressing clean and dry  Disposition: 01-Home or Self Care  Discharge Instructions    Call MD / Call 911    Complete by:  As directed    If you experience chest pain or shortness of breath, CALL 911 and be transported to the hospital emergency room.  If you develope a fever above 101 F, pus (white drainage) or increased drainage or redness at the wound, or calf pain, call your surgeon's office.   Constipation Prevention    Complete by:  As directed    Drink plenty of fluids.  Prune juice may be helpful.  You may use a stool softener, such as Colace (over the counter) 100 mg twice a day.  Use MiraLax (over the counter) for constipation as needed.   Diet - low sodium heart healthy    Complete by:  As directed    Increase activity slowly as tolerated    Complete by:  As directed    Neg Press Wound Therapy / Incisional    Complete by:  As directed    Discharged with the Prevena portable wound VAC. When the wound VAC stops working remove the dressing and the sponge apply a dry dressing (cleansing  with soap and water.       Medication List    TAKE these medications   aluminum chloride 20 % external solution Commonly known as:  DRYSOL Apply 1 application topically at bedtime. Applies to stump nightly.   amLODipine 10 MG tablet Commonly known as:  NORVASC Take 10 mg by mouth daily.   aspirin EC 81 MG tablet Take 81 mg by mouth daily.   cloNIDine 0.3 mg/24hr patch Commonly known as:  CATAPRES - Dosed in mg/24 hr Place 0.3 mg onto the skin once a week. Changes on Mondays.   glimepiride 4 MG tablet Commonly known as:  AMARYL Take 4 mg by mouth daily with breakfast.   hydrALAZINE 100 MG tablet Commonly known as:  APRESOLINE Take 100 mg by mouth 3 (three) times daily.   HYDROcodone-acetaminophen 5-325 MG tablet Commonly known as:  NORCO Take 1 tablet by mouth every 6 (six) hours as needed.   LANTUS SOLOSTAR 100 UNIT/ML Solostar Pen Generic drug:  Insulin Glargine Inject 50 Units into the skin daily.   lisinopril-hydrochlorothiazide 20-12.5 MG tablet Commonly known as:  PRINZIDE,ZESTORETIC Take 1 tablet by mouth daily.   magnesium hydroxide 400 MG/5ML suspension Commonly known as:  MILK OF MAGNESIA Take 30 mLs by mouth at bedtime as needed for mild constipation.   metoprolol succinate 100 MG 24 hr tablet Commonly known as:  TOPROL-XL Take 100 mg by mouth daily. Take with or immediately following a meal.   pantoprazole 40 MG tablet Commonly known  as:  PROTONIX Take 40 mg by mouth daily.   senna 8.6 MG tablet Commonly known as:  SENOKOT Take 1 tablet by mouth 2 (two) times daily.      Follow-up Information    Newt Minion, MD Follow up in 1 week(s).   Specialty:  Orthopedic Surgery Contact information: [address] [place] [address] [phone]           Signed: Newt Minion 01/29/2016, 8:27 AM

## 2016-01-29 NOTE — Evaluation (Signed)
Physical Therapy Evaluation Patient Details Name: Gary [name] MRN: [id number] DOB: [birth year] Today's Date: 01/29/2016   History of Present Illness  Pt is a 60 y.o. male admitted with osteomyelitis and dehiscence of transtibial amputation. Pt now s/p new transtibial amputation and wound VAC application. PMH: Peripheral artery disease, HTN, diabetes, prior amputations.   Clinical Impression  Pt seen for initial evaluation and treatment. Pt able to ambulate 20 ft with rw and min guard assistance, mild instability but no gross loss of balance. Pt states that his wife does work and he goes to [employer] on [address] and is home alone on Tuesday/Thursday. Anticipate pt will D/C to home with family assistance and recommendation of HHPT services. Pt will need to be able to ambulate up/down 3 stairs to enter his home. PT to continue to follow to maximize independence and safety.     Follow Up Recommendations Home health PT;Supervision for mobility/OOB    Equipment Recommendations  Rolling walker with 5" wheels    Recommendations for Other Services       Precautions / Restrictions Precautions Precautions: Fall Restrictions Weight Bearing Restrictions: Yes RLE Weight Bearing: Non weight bearing      Mobility  Bed Mobility Overal bed mobility: Needs Assistance Bed Mobility: Supine to Sit     Supine to sit: Supervision     General bed mobility comments: supervision for safety  Transfers Overall transfer level: Needs assistance Equipment used: Rolling walker (2 wheeled) Transfers: Sit to/from Stand Sit to Stand: Min guard         General transfer comment: cues for hand position, min guard for safety  Ambulation/Gait Ambulation/Gait assistance: Min guard Ambulation Distance (Feet): 20 Feet Assistive device: Rolling walker (2 wheeled) Gait Pattern/deviations:  (swing-to pattern) Gait velocity: decreased   General Gait Details: Pt with mild instability during ambulation, no gross loss  of balance.   Stairs            Wheelchair Mobility    Modified Rankin (Stroke Patients Only)       Balance Overall balance assessment: Needs assistance Sitting-balance support: No upper extremity supported Sitting balance-Leahy Scale: Good     Standing balance support: Bilateral upper extremity supported Standing balance-Leahy Scale: Poor Standing balance comment: using rw                             Pertinent Vitals/Pain Pain Assessment: 0-10 Pain Score: 8  Pain Location: Rt LE  Pain Descriptors / Indicators: Throbbing Pain Intervention(s): Limited activity within patient's tolerance;Monitored during session    [place] expects to be discharged to:: Private residence Living Arrangements: Spouse/significant other Available Help at Discharge: Family;Available PRN/intermittently Type of Home: House Home Access: Stairs to enter Entrance Stairs-Rails: Psychiatric nurse of Steps: 3 Home Layout: One level Home Equipment: Crutches;Wheelchair - manual;Cane - single point;Bedside commode Additional Comments: spouse works during the day.     Prior Function Level of Independence: Independent with assistive device(s)         Comments: able to be up walking with prosthesis on.      Hand Dominance        Extremity/Trunk Assessment   Upper Extremity Assessment: Overall WFL for tasks assessed           Lower Extremity Assessment: RLE deficits/detail RLE Deficits / Details: pt able to raise Rt LE without assistance       Communication   Communication: No difficulties  Cognition Arousal/Alertness:  Awake/alert Behavior During Therapy: WFL for tasks assessed/performed Overall Cognitive Status: Within Functional Limits for tasks assessed                      General Comments General comments (skin integrity, edema, etc.): Reviewed knee BKA precautions    Exercises     Assessment/Plan    PT  Assessment Patient needs continued PT services  PT Problem List Decreased strength;Decreased balance;Decreased activity tolerance;Decreased range of motion;Decreased mobility          PT Treatment Interventions DME instruction;Gait training;Stair training;Functional mobility training;Therapeutic activities;Therapeutic exercise;Patient/family education;Wheelchair mobility training    PT Goals (Current goals can be found in the Care Plan section)  Acute Rehab PT Goals Patient Stated Goal: eventually get back to walking PT Goal Formulation: With patient Time For Goal Achievement: 02/12/16 Potential to Achieve Goals: Good    Frequency Min 3X/week   Barriers to discharge        Co-evaluation               End of Session Equipment Utilized During Treatment: Gait belt Activity Tolerance: Patient tolerated treatment well Patient left: in chair;with call bell/phone within reach Nurse Communication: Mobility status    Functional Assessment Tool Used: clinical judgment Functional Limitation: Mobility: Walking and moving around Mobility: Walking and Moving Around Current Status [phone]): At least 20 percent but less than 40 percent impaired, limited or restricted Mobility: Walking and Moving Around Goal Status [phone]): At least 1 percent but less than 20 percent impaired, limited or restricted    Time: 0943-1008 PT Time Calculation (min) (ACUTE ONLY): 25 min   Charges:   PT Evaluation $PT Eval Moderate Complexity: 1 Procedure PT Treatments $Gait Training: 8-22 mins   PT G Codes:   PT G-Codes **NOT FOR INPATIENT CLASS** Functional Assessment Tool Used: clinical judgment Functional Limitation: Mobility: Walking and moving around Mobility: Walking and Moving Around Current Status ([id number]): At least 20 percent but less than 40 percent impaired, limited or restricted Mobility: Walking and Moving Around Goal Status [phone]): At least 1 percent but less than 20 percent impaired,  limited or restricted    Cassell Clement, PT, CSCS Pager [phone] Office [phone]  01/29/2016, 10:25 AM

## 2016-01-29 NOTE — Progress Notes (Signed)
Physical Therapy Treatment Patient Details Name: Gary [name] MRN: [id number] DOB: 30-May-1955 Today's Date: 01/29/2016    History of Present Illness Pt is a 60 y.o. male admitted with osteomyelitis and dehiscence of transtibial amputation. Pt now s/p new transtibial amputation and wound VAC application. PMH: Peripheral artery disease, HTN, diabetes, prior amputations.     PT Comments    Patient is making good progress with PT.  From a mobility standpoint anticipate patient will be ready for DC home with spouse to assist. Pt denies any questions or concerns following session.      Follow Up Recommendations  Home health PT;Supervision for mobility/OOB     Equipment Recommendations  Rolling walker with 5" wheels    Recommendations for Other Services       Precautions / Restrictions Precautions Precautions: Fall Restrictions Weight Bearing Restrictions: Yes RLE Weight Bearing: Non weight bearing    Mobility  Bed Mobility Overal bed mobility: Needs Assistance Bed Mobility: Supine to Sit;Sit to Supine     Supine to sit: Modified independent (Device/Increase time) Sit to supine: Modified independent (Device/Increase time)      Transfers Overall transfer level: Needs assistance Equipment used: Rolling walker (2 wheeled) Transfers: Sit to/from Stand Sit to Stand: Min guard         General transfer comment: cues for hand position  Ambulation/Gait Ambulation/Gait assistance: Min guard Ambulation Distance (Feet): 25 Feet Assistive device: Rolling walker (2 wheeled) Gait Pattern/deviations:  (swing-to pattern) Gait velocity: decreased   General Gait Details: Good stability, no loss of balance.    Stairs Stairs: Yes Stairs assistance: Min guard Stair Management: One rail Left;Step to pattern;Forwards;With crutches Number of Stairs: 5 General stair comments: Pt with good stability. Pt reports feeling confident to get into his home.   Wheelchair Mobility     Modified Rankin (Stroke Patients Only)       Balance Overall balance assessment: Needs assistance Sitting-balance support: No upper extremity supported Sitting balance-Leahy Scale: Good     Standing balance support: Bilateral upper extremity supported Standing balance-Leahy Scale: Poor Standing balance comment: using  rw                    Cognition Arousal/Alertness: Awake/alert Behavior During Therapy: WFL for tasks assessed/performed Overall Cognitive Status: Within Functional Limits for tasks assessed                      Exercises      General Comments        Pertinent Vitals/Pain Pain Assessment: Faces Faces Pain Scale: Hurts little more Pain Location: Rt LE Pain Descriptors / Indicators: Aching Pain Intervention(s): Limited activity within patient's tolerance;Monitored during session    Home Living                      Prior Function            PT Goals (current goals can now be found in the care plan section) Acute Rehab PT Goals Patient Stated Goal: Get home when they say I can leave PT Goal Formulation: With patient Time For Goal Achievement: 02/12/16 Potential to Achieve Goals: Good Progress towards PT goals: Progressing toward goals    Frequency    Min 3X/week      PT Plan Current plan remains appropriate    Co-evaluation             End of Session Equipment Utilized During Treatment: Gait belt Activity Tolerance: Patient  tolerated treatment well Patient left: in bed;with call bell/phone within reach     Time: 0932-6712 PT Time Calculation (min) (ACUTE ONLY): 16 min  Charges:  $Gait Training: 8-22 mins                    G Codes:      Cassell Clement, PT, CSCS Pager [phone] Office [phone]  01/29/2016, 3:23 PM

## 2016-01-29 NOTE — Care Management Note (Signed)
Case Management Note  Patient Details  Name: Gary [name] MRN: [id number] Date of Birth: [birth year]  Subjective/Objective:                  Admission Diagnoses:Osteomyelitis dehiscence transtibial amputation  Action/Plan: CM spoke to patient at the bedside about his discharge needs. PT recommending HH PT and RW. Patient said that he felt comfortable going home but did not want anyone coming into the home for services. [facility] and they will be able to provided needed therapy services there as well as transportation to and from. His wife will go to work on Monday but he said that with his home equipment, he felt safe with discharging home. He has a 3N1, Wheelchair, and crutches. [facility] to have RW delivered to the bedside. CM called [facility] RN @ [phone] who will come to see patient at the bedside.Patient has a Right leg wound vac in place possibly being changed to a Prevena but if he discharges on regular wound vac, will set up with [facility]. CM will follow for possible wound vac needs. Charge Nurse Irven Baltimore to call MD regarding wound vac.   Expected Discharge Date:  01/29/16              Expected Discharge Plan:  [place]  In-House Referral:     Discharge planning Services  CM Consult  Post Acute Care Choice:  Durable Medical Equipment, Home Health Choice offered to:  Patient  DME Arranged:  Walker rolling DME Agency:  [place]:  [place] Agency:   ([facility])  Status of Service:  In process, will continue to follow  If discussed at Long Length of Stay Meetings, dates discussed:    Additional Comments:  Guido Sander, RN 01/29/2016, 11:51 AM

## 2016-01-29 NOTE — Progress Notes (Signed)
Pt unable to void. Received orders to in and out cath x1; if pt unable to void on the second try then RN is to place foley. RN in and out cath pt at 0120, got out 1000 cc. Nursing will continue to monitor.

## 2016-01-30 ENCOUNTER — Encounter (HOSPITAL_COMMUNITY): Payer: Self-pay | Admitting: Orthopedic Surgery

## 2016-01-31 ENCOUNTER — Encounter (HOSPITAL_COMMUNITY): Payer: Medicare (Managed Care)

## 2016-01-31 ENCOUNTER — Ambulatory Visit: Payer: Medicare (Managed Care) | Admitting: Family

## 2016-01-31 ENCOUNTER — Other Ambulatory Visit (HOSPITAL_COMMUNITY): Payer: Medicare (Managed Care)

## 2016-02-09 ENCOUNTER — Inpatient Hospital Stay (INDEPENDENT_AMBULATORY_CARE_PROVIDER_SITE_OTHER): Payer: Medicare (Managed Care) | Admitting: Family

## 2016-02-09 DIAGNOSIS — Z89511 Acquired absence of right leg below knee: Secondary | ICD-10-CM

## 2016-02-09 DIAGNOSIS — I70261 Atherosclerosis of native arteries of extremities with gangrene, right leg: Secondary | ICD-10-CM

## 2016-02-09 DIAGNOSIS — M86271 Subacute osteomyelitis, right ankle and foot: Secondary | ICD-10-CM

## 2016-02-16 ENCOUNTER — Ambulatory Visit (INDEPENDENT_AMBULATORY_CARE_PROVIDER_SITE_OTHER): Payer: Medicare (Managed Care) | Admitting: Orthopedic Surgery

## 2016-02-16 DIAGNOSIS — E1142 Type 2 diabetes mellitus with diabetic polyneuropathy: Secondary | ICD-10-CM

## 2016-02-16 DIAGNOSIS — I70261 Atherosclerosis of native arteries of extremities with gangrene, right leg: Secondary | ICD-10-CM

## 2016-02-16 DIAGNOSIS — Z89511 Acquired absence of right leg below knee: Secondary | ICD-10-CM

## 2016-02-25 ENCOUNTER — Ambulatory Visit (INDEPENDENT_AMBULATORY_CARE_PROVIDER_SITE_OTHER): Payer: Medicare (Managed Care) | Admitting: Orthopedic Surgery

## 2016-03-02 ENCOUNTER — Encounter (INDEPENDENT_AMBULATORY_CARE_PROVIDER_SITE_OTHER): Payer: Self-pay | Admitting: Orthopedic Surgery

## 2016-03-02 ENCOUNTER — Ambulatory Visit (INDEPENDENT_AMBULATORY_CARE_PROVIDER_SITE_OTHER): Payer: Medicare (Managed Care) | Admitting: Orthopedic Surgery

## 2016-03-02 VITALS — Ht 67.0 in | Wt 158.0 lb

## 2016-03-02 DIAGNOSIS — Z89511 Acquired absence of right leg below knee: Secondary | ICD-10-CM

## 2016-03-02 NOTE — Progress Notes (Signed)
Wound Care Note   Patient: Gary [name]           Date of Birth: 1956/02/05           MRN: [id number]             PCP: Sherian Maroon, MD Visit Date: 03/02/2016   Assessment & Plan: Visit Diagnoses:  1. S/P below knee amputation, right (HCC)   4 weeks status post revision right transtibial amputation  Plan: Follow-up with [facility] for prosthetic fitting. [facility]   Follow-Up Instructions: Return in about 3 months (around 06/02/2016).  Orders:  No orders of the defined types were placed in this encounter.  No orders of the defined types were placed in this encounter.     Procedures: No notes on file   Clinical Data: No additional findings.   No images are attached to the encounter.   Subjective: Chief Complaint  Patient presents with  . Right Knee - Routine Post Op  . Routine Post Op    Transtibial amputation revision 01/28/16    Patient presents today for 2 week follow up of right transtibial amputation revision on 01/28/16. He is approximately 5 weeks post op. He is packing wound with dry gauze and wearing shrinker. There is very minimal drainage, there is no odor no redness. Patient denies any kind of pain and overall is pleased with his progress. He is nonweightbearing with wheelchair today.    Review of Systems  Miscellaneous:  -Home Health Care: receives nursing care with [facility], he goes to [facility] 5x's a week  -Physical Therapy: doing therapy 5x's week  -Out of Work?: yes     Objective: Vital Signs: Ht 5\' 7"  (1.702 m)   Wt 158 lb (71.7 kg)   BMI 24.75 kg/m   Physical Exam: On examination patient's leg has healed nicely there is no redness no cellulitis no signs of infection. There is good consolidation he is wearing a traditional shrinker.  Specialty Comments: No specialty comments available.   PMFS History: Patient Active Problem List   Diagnosis Date Noted  . Below knee amputation  status ([place]) 01/28/2016  . Aftercare following surgery of the circulatory system, [place] 12/01/2013  . Follow-up examination, following unspecified surgery 11/25/2012  . Atherosclerosis of native arteries of the extremities with ulceration(440.23) 11/25/2012  . Atherosclerotic PVD with ulceration ([place]) 10/01/2012  . Pure hypercholesterolemia 09/26/2012  . Atherosclerosis of native arteries of the extremities, unspecified 09/26/2012  . Type II or unspecified type diabetes mellitus with neurological manifestations, not stated as uncontrolled(250.60) 09/26/2012  . Type II or unspecified type diabetes mellitus with ophthalmic manifestations, not stated as uncontrolled(250.50) 09/26/2012  . blurred vision due to refraction problems 09/26/2012  . poor dentition 09/26/2012  . poor dentition 09/26/2012  . Insomnia 09/26/2012  . Hypertension 09/26/2012  . Tobacco use disorder 09/26/2012  . Peripheral vascular disease 09/26/2012  . BKA right leg 09/26/2012  . Vitamin D deficiency aeb lab values of 16.5 ng/mL 09/26/2012  . Routine general medical examination at a health care facility 09/26/2012  . GERD (gastroesophageal reflux disease) 09/26/2012  . Sensorineural hearing loss (Per ENT) 09/26/2012  . constipation 09/26/2012  . Osteomyelitis of left great toe 09/26/2012  . Toe osteomyelitis, left ([place]) 09/26/2012  . 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 Blurred vision   . Constipation   . Constipation   . Diabetes mellitus  type 2  . GERD (gastroesophageal reflux disease)   . High cholesterol   . Hypertension   . Insomnia   . Peripheral artery disease ([place])   . [place] stain of face    Left V1 distribution, including upper eyelid  . Sensorineural hearing loss    left ear  . Vitamin D deficiency     Family History  Problem Relation Age of Onset  . Diabetes Mother   . Hypertension Mother   . Cancer Father   . Colon cancer Neg Hx    Past  Surgical History:  Procedure Laterality Date  . AMPUTATION Right 07/17/2011  . AMPUTATION Left 10/03/2012   Procedure: AMPUTATION DIGIT;  Surgeon: Serafina Mitchell, MD;  Location: [facility] OR;  Service: Vascular;  Laterality: Left;  Amputation of left Great  toe  . COLONOSCOPY    . PERIPHERAL ARTERIAL STENT GRAFT N/A 2010  . STUMP REVISION Right 01/28/2016   Procedure: Revision Right Below Knee Amputation;  Surgeon: Newt Minion, MD;  Location: [facility];  Service: Orthopedics;  Laterality: Right;   Social History   Occupational History  . Not on file.   Social History Main Topics  . Smoking status: Heavy Tobacco Smoker    Packs/day: 0.50    Years: 30.00    Types: Cigarettes  . Smokeless tobacco: Never Used     Comment:  down from 2 packs/day  . Alcohol use No  . Drug use: No  . Sexual activity: Not on file

## 2016-06-01 ENCOUNTER — Encounter (INDEPENDENT_AMBULATORY_CARE_PROVIDER_SITE_OTHER): Payer: Self-pay | Admitting: Orthopedic Surgery

## 2016-06-01 ENCOUNTER — Ambulatory Visit (INDEPENDENT_AMBULATORY_CARE_PROVIDER_SITE_OTHER): Payer: Medicare (Managed Care) | Admitting: Orthopedic Surgery

## 2016-06-01 DIAGNOSIS — M25561 Pain in right knee: Secondary | ICD-10-CM | POA: Diagnosis not present

## 2016-06-01 DIAGNOSIS — Z89511 Acquired absence of right leg below knee: Secondary | ICD-10-CM | POA: Diagnosis not present

## 2016-06-01 NOTE — Progress Notes (Signed)
Office Visit Note   Patient: Gary [name]           Date of Birth: 30-Aug-1955           MRN: [id number] Visit Date: 06/01/2016              Requested by: Gary Adie, MD Gary [name], [place] [id number] PCP: Gary Maroon, MD  Chief Complaint  Patient presents with  . Right Knee - Follow-up    Transtibial amputation revision 01/28/16    HPI: Patient presents for 3 month follow up of right transtibial amputation. He is ambulating well today with prosthetic and cane. He is feel very well overall. He has no questions or concerns at this time. He is currently under care at [facility] as well. Gary [name], RT    Assessment & Plan: Visit Diagnoses:  1. Status post below knee amputation of right lower extremity (Gary [name])     Plan: Follow-up as needed. Continue his therapy at [facility].  Follow-Up Instructions: Return if symptoms worsen or fail to improve.   Ortho Exam Examination patient is alert oriented no adenopathy well-dressed normal affect normal respiratory effort he ambulates with a cane. Examination he is a well fitting prosthesis and the right no ulcers no cellulitis no signs of infection.  Imaging: No results found.  Orders:  No orders of the defined types were placed in this encounter.  No orders of the defined types were placed in this encounter.    Procedures: No procedures performed  Clinical Data: No additional findings.  Subjective: Review of Systems  Objective: Vital Signs: There were no vitals taken for this visit.  Specialty Comments:  No specialty comments available.  PMFS History: Patient Active Problem List   Diagnosis Date Noted  . Below knee amputation status ([place]) 01/28/2016  . Aftercare following surgery of the circulatory system, [place] 12/01/2013  . Follow-up examination, following unspecified surgery 11/25/2012  . Atherosclerosis of native arteries of the extremities with ulceration(440.23)  11/25/2012  . Atherosclerotic PVD with ulceration ([place]) 10/01/2012  . Pure hypercholesterolemia 09/26/2012  . Atherosclerosis of native arteries of the extremities, unspecified 09/26/2012  . Type II or unspecified type diabetes mellitus with neurological manifestations, not stated as uncontrolled(250.60) 09/26/2012  . Type II or unspecified type diabetes mellitus with ophthalmic manifestations, not stated as uncontrolled(250.50) 09/26/2012  . blurred vision due to refraction problems 09/26/2012  . poor dentition 09/26/2012  . poor dentition 09/26/2012  . Insomnia 09/26/2012  . Hypertension 09/26/2012  . Tobacco use disorder 09/26/2012  . Peripheral vascular disease 09/26/2012  . BKA right leg 09/26/2012  . Vitamin D deficiency aeb lab values of 16.5 ng/mL 09/26/2012  . Routine general medical examination at a health care facility 09/26/2012  . GERD (gastroesophageal reflux disease) 09/26/2012  . Sensorineural hearing loss (Per ENT) 09/26/2012  . constipation 09/26/2012  . Osteomyelitis of left great toe 09/26/2012  . Toe osteomyelitis, left ([place]) 09/26/2012  . 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 Blurred vision   . Constipation   . Constipation   . Diabetes mellitus    type 2  . GERD (gastroesophageal reflux disease)   . High cholesterol   . Hypertension   . Insomnia   . Peripheral artery disease ([place])   . Port-wine stain of face    Left V1 distribution, including upper eyelid  . Sensorineural hearing loss    left  ear  . Vitamin D deficiency     Family History  Problem Relation Age of Onset  . Diabetes Mother   . Hypertension Mother   . Cancer Father   . Colon cancer Neg Hx     Past Surgical History:  Procedure Laterality Date  . AMPUTATION Right 07/17/2011  . AMPUTATION Left 10/03/2012   Procedure: AMPUTATION DIGIT;  Surgeon: Serafina Mitchell, MD;  Location: [facility];  Service: Vascular;  Laterality: Left;  Amputation of left  Great  toe  . COLONOSCOPY    . PERIPHERAL ARTERIAL STENT GRAFT N/A 2010  . STUMP REVISION Right 01/28/2016   Procedure: Revision Right Below Knee Amputation;  Surgeon: Newt Minion, MD;  Location: [place] [facility];  Service: Orthopedics;  Laterality: Right;   Social History   Occupational History  . Not on file.   Social History Main Topics  . Smoking status: Heavy Tobacco Smoker    Packs/day: 0.50    Years: 30.00    Types: Cigarettes  . Smokeless tobacco: Never Used     Comment:  down from 2 packs/day  . Alcohol use No  . Drug use: No  . Sexual activity: Not on file

## 2016-09-05 ENCOUNTER — Other Ambulatory Visit: Payer: Self-pay | Admitting: Nephrology

## 2016-09-05 DIAGNOSIS — N184 Chronic kidney disease, stage 4 (severe): Secondary | ICD-10-CM

## 2016-09-08 ENCOUNTER — Ambulatory Visit
Admission: RE | Admit: 2016-09-08 | Discharge: 2016-09-08 | Disposition: A | Discharge status: Home / Self Care | Payer: No Typology Code available for payment source | Source: Ambulatory Visit | Attending: Nephrology | Admitting: Nephrology

## 2016-09-08 DIAGNOSIS — N184 Chronic kidney disease, stage 4 (severe): Secondary | ICD-10-CM

## 2017-07-04 ENCOUNTER — Other Ambulatory Visit: Payer: Self-pay

## 2017-07-04 DIAGNOSIS — N185 Chronic kidney disease, stage 5: Secondary | ICD-10-CM

## 2017-07-04 DIAGNOSIS — Z01812 Encounter for preprocedural laboratory examination: Secondary | ICD-10-CM

## 2017-08-06 ENCOUNTER — Ambulatory Visit (INDEPENDENT_AMBULATORY_CARE_PROVIDER_SITE_OTHER): Payer: Medicare (Managed Care) | Admitting: Surgery

## 2017-08-06 ENCOUNTER — Ambulatory Visit (INDEPENDENT_AMBULATORY_CARE_PROVIDER_SITE_OTHER)
Admission: RE | Admit: 2017-08-06 | Discharge: 2017-08-06 | Disposition: A | Discharge status: Home / Self Care | Payer: Medicare (Managed Care) | Source: Ambulatory Visit | Attending: Surgery | Admitting: Surgery

## 2017-08-06 ENCOUNTER — Encounter: Payer: Self-pay | Admitting: Surgery

## 2017-08-06 ENCOUNTER — Encounter: Payer: Self-pay | Admitting: *Deleted

## 2017-08-06 ENCOUNTER — Other Ambulatory Visit: Payer: Self-pay | Admitting: *Deleted

## 2017-08-06 ENCOUNTER — Ambulatory Visit (HOSPITAL_COMMUNITY)
Admission: RE | Admit: 2017-08-06 | Discharge: 2017-08-06 | Disposition: A | Discharge status: Home / Self Care | Payer: Medicare (Managed Care) | Source: Ambulatory Visit | Attending: Surgery | Admitting: Surgery

## 2017-08-06 VITALS — BP 200/85 | HR 61 | Resp 20 | Ht 67.0 in | Wt 169.3 lb

## 2017-08-06 DIAGNOSIS — Z89511 Acquired absence of right leg below knee: Secondary | ICD-10-CM | POA: Insufficient documentation

## 2017-08-06 DIAGNOSIS — N184 Chronic kidney disease, stage 4 (severe): Secondary | ICD-10-CM | POA: Diagnosis not present

## 2017-08-06 DIAGNOSIS — I120 Hypertensive chronic kidney disease with stage 5 chronic kidney disease or end stage renal disease: Secondary | ICD-10-CM | POA: Diagnosis not present

## 2017-08-06 DIAGNOSIS — E785 Hyperlipidemia, unspecified: Secondary | ICD-10-CM | POA: Diagnosis not present

## 2017-08-06 DIAGNOSIS — N185 Chronic kidney disease, stage 5: Secondary | ICD-10-CM

## 2017-08-06 DIAGNOSIS — Z01812 Encounter for preprocedural laboratory examination: Secondary | ICD-10-CM

## 2017-08-06 DIAGNOSIS — E1322 Other specified diabetes mellitus with diabetic chronic kidney disease: Secondary | ICD-10-CM | POA: Diagnosis not present

## 2017-08-06 DIAGNOSIS — F17200 Nicotine dependence, unspecified, uncomplicated: Secondary | ICD-10-CM | POA: Diagnosis not present

## 2017-08-06 NOTE — Progress Notes (Signed)
HISTORY AND PHYSICAL     CC:  In need of dialysis access Requesting Provider:  Janifer Adie, MD  HPI: This is a 62 y.o. male who is referred by Dr. Jimmy Footman for HD access.  He has hx of longstanding HTN and diabetes.  He has PAD with hx of aortobifemoral bypass grafting by Dr. Trula Slade in June 2010.  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  He ambulates with a cane.   He was last seen in our office in 2016 with ABI's and carotid duplex.  He has not been seen in our office since then.  His carotid duplex revealed <40% stenosis of the bilateral internal carotid arteries. Right vertebral artery is antegrade, left cannot be visualized. No significant change compared to prior exam on 01/19/14.  His left ABI was 0.73 (0.85 on 12/01/13) - DP: monophasic, PT: [place], monophasic waveforms TBI: great toe is surgically absent.  He takes a daily aspirin.  He takes a beta blocker, hydralazine, and CCB for blood pressure management.  He takes insulin for diabetes.    Past Medical History:  Diagnosis Date  . Blurred vision   . Constipation   . Constipation   . Diabetes mellitus    type 2  . GERD (gastroesophageal reflux disease)   . High cholesterol   . Hypertension   . Insomnia   . Peripheral artery disease ([place])   . Port-wine stain of face    Left V1 distribution, including upper eyelid  . Sensorineural hearing loss    left ear  . Vitamin D deficiency     Past Surgical History:  Procedure Laterality Date  . AMPUTATION Right 07/17/2011  . AMPUTATION Left 10/03/2012   Procedure: AMPUTATION DIGIT;  Surgeon: Serafina Mitchell, MD;  Location: [facility] OR;  Service: Vascular;  Laterality: Left;  Amputation of left Great  toe  . COLONOSCOPY    . PERIPHERAL ARTERIAL STENT GRAFT N/A 2010  . STUMP REVISION Right 01/28/2016   Procedure: Revision Right Below Knee Amputation;  Surgeon: Newt Minion, MD;  Location: [place];  Service: Orthopedics;  Laterality: Right;    Allergies    Allergen Reactions  . Oxycodone-Acetaminophen Rash    Macular papular rash after two days on Oxy/APAP resolved with d/c of drug.    Current Outpatient Medications  Medication Sig Dispense Refill  . aluminum chloride (DRYSOL) 20 % external solution Apply 1 application topically at bedtime. Applies to stump nightly.    Marland Kitchen amLODipine (NORVASC) 10 MG tablet Take 10 mg by mouth daily.    Marland Kitchen aspirin EC 81 MG tablet Take 81 mg by mouth daily.    . cloNIDine (CATAPRES - DOSED IN MG/24 HR) 0.3 mg/24hr patch Place 0.3 mg onto the skin once a week. Changes on Mondays.    . Evolocumab (REPATHA SURECLICK) 182 MG/ML SOAJ Inject into the skin.    . Insulin Glargine (LANTUS SOLOSTAR) 100 UNIT/ML Solostar Pen Inject 50 Units into the skin daily.    . metoprolol succinate (TOPROL-XL) 100 MG 24 hr tablet Take 100 mg by mouth daily. Take with or immediately following a meal.    . senna (SENOKOT) 8.6 MG tablet Take 1 tablet by mouth 2 (two) times daily.    . sodium bicarbonate 650 MG tablet Take 650 mg by mouth 4 (four) times daily.    Marland Kitchen glimepiride (AMARYL) 4 MG tablet Take 4 mg by mouth daily with breakfast.    .  hydrALAZINE (APRESOLINE) 100 MG tablet Take 100 mg by mouth 3 (three) times daily.    Marland Kitchen HYDROcodone-acetaminophen (NORCO) 5-325 MG tablet Take 1 tablet by mouth every 6 (six) hours as needed. (Patient not taking: Reported on 08/06/2017) 30 tablet 0  . lisinopril-hydrochlorothiazide (PRINZIDE,ZESTORETIC) 20-12.5 MG tablet Take 1 tablet by mouth daily.    . magnesium hydroxide (MILK OF MAGNESIA) 400 MG/5ML suspension Take 30 mLs by mouth at bedtime as needed for mild constipation.    . pantoprazole (PROTONIX) 40 MG tablet Take 40 mg by mouth daily.     No current facility-administered medications for this visit.     Family History  Problem Relation Age of Onset  . Diabetes Mother   . Hypertension Mother   . Cancer Father   . Colon cancer Neg Hx     Social History   Socioeconomic History  .  Marital status: Married    Spouse name: Not on file  . Number of children: Not on file  . Years of education: Not on file  . Highest education level: Not on file  Occupational History  . Not on file  Social Needs  . Financial resource strain: Not on file  . Food insecurity:    Worry: Not on file    Inability: Not on file  . Transportation needs:    Medical: Not on file    Non-medical: Not on file  Tobacco Use  . Smoking status: Heavy Tobacco Smoker    Packs/day: 0.50    Years: 30.00    Pack years: 15.00    Types: Cigarettes  . Smokeless tobacco: Never Used  . Tobacco comment:  down from 2 packs/day  Substance and Sexual Activity  . Alcohol use: No    Alcohol/week: 0.0 oz  . Drug use: No  . Sexual activity: Not on file  Lifestyle  . Physical activity:    Days per week: Not on file    Minutes per session: Not on file  . Stress: Not on file  Relationships  . Social connections:    Talks on phone: Not on file    Gets together: Not on file    Attends religious service: Not on file    Active member of club or organization: Not on file    Attends meetings of clubs or organizations: Not on file    Relationship status: Not on file  . Intimate partner violence:    Fear of current or ex partner: Not on file    Emotionally abused: Not on file    Physically abused: Not on file    Forced sexual activity: Not on file  Other Topics Concern  . Not on file  Social History Narrative  . Not on file     REVIEW OF SYSTEMS:   [X]  denotes positive finding, [ ]  denotes negative finding Cardiac  Comments:  Chest pain or chest pressure:    Shortness of breath upon exertion:    Short of breath when lying flat:    Irregular heart rhythm:        Vascular    Pain in calf, thigh, or hip brought on by ambulation:    Pain in feet at night that wakes you up from your sleep:     Blood clot in your veins:    Leg swelling:         Pulmonary    Oxygen at home:    Productive cough:       Wheezing:  Neurologic    Sudden weakness in arms or legs:     Sudden numbness in arms or legs:     Sudden onset of difficulty speaking or slurred speech:    Temporary loss of vision in one eye:     Problems with dizziness:         Gastrointestinal    Blood in stool:     Vomited blood:         Genitourinary    Burning when urinating:     Blood in urine:        Psychiatric    Major depression:         Hematologic    Bleeding problems:    Problems with blood clotting too easily:        Skin    Rashes or ulcers:        Constitutional    Fever or chills:      PHYSICAL EXAMINATION:  Vitals:   08/06/17 1349 08/06/17 1350  BP: (!) 204/89 (!) 200/85  Pulse: 61   Resp: 20   SpO2: 99%    Vitals:   08/06/17 1349  Weight: 169 lb 4.8 oz (76.8 kg)  Height: 5\' 7"  (1.702 m)   Body mass index is 26.52 kg/m.  General:  WDWN in NAD; vital signs documented above Gait: Not observed HENT: WNL, normocephalic Pulmonary: normal non-labored breathing , without Rales, rhonchi,  wheezing Cardiac: regular HR, without  Murmurs without carotid bruits Abdomen: soft, NT, no masses Skin: without rashes Vascular Exam/Pulses:  Right Left  Radial 2+ (normal) 2+ (normal)  Ulnar 1+ (weak) 1+ (weak)  Brachial 2+ (normal) 2+ (normal)   Extremities: Prosthesis in place RLE; left leg with swelling; unable to palpate pedal pulses.  Musculoskeletal: no muscle wasting or atrophy  Neurologic: A&O X 3;  No focal weakness or paresthesias are detected Psychiatric:  The pt has Normal affect.   Non-Invasive Vascular Imaging:   BUE vein mapping 0/6/23: Right cephalic:  7.62GB-1.51VO Right basilic:  1.60VP-7.10GY Left cephalic:  6.94WN-4.62VO Left baslic:  3.50KX-3.81WE  Upper extremity arterial study 08/06/17: Right: Brachial:  0.49cm (T) Radial:  0.21cm (B) calcified Ulnar:  0.23cm (B) calcified  Left:   Brachial:  0.54cm (T) Radial:  0.21cm (B) calcified Ulnar:  0.16cm (B)  calcified    Pt meds includes: Statin:  No. Beta Blocker:  Yes.   Aspirin:  No. ACEI:  No. ARB:  No. CCB use:  Yes Other Antiplatelet/Anticoagulant:  No   ASSESSMENT/PLAN:: 62 y.o. male with CKD 4 in need of permanent dialysis access   -pt is right hand dominant and his cephalic vein in the left arm is adequate for left arm brachial cephalic AV fistula.   -discussed risks/benefits with pt -will schedule for May 2nd with Dr. Zada Girt, PA-C [facility] [phone]  Clinic MD:  Pt seen and examined with Dr. Trula Slade   I agree with the above.  Have seen and evaluated the patient.  He is well-known to me having undergone aortobifemoral bypass graft.  He has not been followed up on in a few years as he has missed appointments.  He is now here today for dialysis access.  He had vein mapping that showed an adequate left cephalic vein.  Arterial duplex showed calcified radial arteries.  He has palpable radial brachial pulse.  I recommended proceeding with a left brachiocephalic fistula.  The risks and benefits of the operation were discussed the patient including the  risk of non-maturity, the need for future interventions, and the risk of steal syndrome.  This procedure has been scheduled for Thursday, May 2  Annamarie Major

## 2017-08-06 NOTE — H&P (View-Only) (Signed)
HISTORY AND PHYSICAL     CC:  In need of dialysis access Requesting Provider:  Janifer Adie, MD  HPI: This is a 62 y.o. male who is referred by Dr. Jimmy Footman for HD access.  He has hx of longstanding HTN and diabetes.  He has PAD with hx of aortobifemoral bypass grafting by Dr. Trula Slade in June 2010.  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  He ambulates with a cane.   He was last seen in our office in 2016 with ABI's and carotid duplex.  He has not been seen in our office since then.  His carotid duplex revealed <40% stenosis of the bilateral internal carotid arteries. Right vertebral artery is antegrade, left cannot be visualized. No significant change compared to prior exam on 01/19/14.  His left ABI was 0.73 (0.85 on 12/01/13) - DP: monophasic, PT: [place], monophasic waveforms TBI: great toe is surgically absent.  He takes a daily aspirin.  He takes a beta blocker, hydralazine, and CCB for blood pressure management.  He takes insulin for diabetes.    Past Medical History:  Diagnosis Date  . Blurred vision   . Constipation   . Constipation   . Diabetes mellitus    type 2  . GERD (gastroesophageal reflux disease)   . High cholesterol   . Hypertension   . Insomnia   . Peripheral artery disease ([place])   . Port-wine stain of face    Left V1 distribution, including upper eyelid  . Sensorineural hearing loss    left ear  . Vitamin D deficiency     Past Surgical History:  Procedure Laterality Date  . AMPUTATION Right 07/17/2011  . AMPUTATION Left 10/03/2012   Procedure: AMPUTATION DIGIT;  Surgeon: Serafina Mitchell, MD;  Location: [facility] OR;  Service: Vascular;  Laterality: Left;  Amputation of left Great  toe  . COLONOSCOPY    . PERIPHERAL ARTERIAL STENT GRAFT N/A 2010  . STUMP REVISION Right 01/28/2016   Procedure: Revision Right Below Knee Amputation;  Surgeon: Newt Minion, MD;  Location: [place];  Service: Orthopedics;  Laterality: Right;    Allergies    Allergen Reactions  . Oxycodone-Acetaminophen Rash    Macular papular rash after two days on Oxy/APAP resolved with d/c of drug.    Current Outpatient Medications  Medication Sig Dispense Refill  . aluminum chloride (DRYSOL) 20 % external solution Apply 1 application topically at bedtime. Applies to stump nightly.    Marland Kitchen amLODipine (NORVASC) 10 MG tablet Take 10 mg by mouth daily.    Marland Kitchen aspirin EC 81 MG tablet Take 81 mg by mouth daily.    . cloNIDine (CATAPRES - DOSED IN MG/24 HR) 0.3 mg/24hr patch Place 0.3 mg onto the skin once a week. Changes on Mondays.    . Evolocumab (REPATHA SURECLICK) 196 MG/ML SOAJ Inject into the skin.    . Insulin Glargine (LANTUS SOLOSTAR) 100 UNIT/ML Solostar Pen Inject 50 Units into the skin daily.    . metoprolol succinate (TOPROL-XL) 100 MG 24 hr tablet Take 100 mg by mouth daily. Take with or immediately following a meal.    . senna (SENOKOT) 8.6 MG tablet Take 1 tablet by mouth 2 (two) times daily.    . sodium bicarbonate 650 MG tablet Take 650 mg by mouth 4 (four) times daily.    Marland Kitchen glimepiride (AMARYL) 4 MG tablet Take 4 mg by mouth daily with breakfast.    .  hydrALAZINE (APRESOLINE) 100 MG tablet Take 100 mg by mouth 3 (three) times daily.    Marland Kitchen HYDROcodone-acetaminophen (NORCO) 5-325 MG tablet Take 1 tablet by mouth every 6 (six) hours as needed. (Patient not taking: Reported on 08/06/2017) 30 tablet 0  . lisinopril-hydrochlorothiazide (PRINZIDE,ZESTORETIC) 20-12.5 MG tablet Take 1 tablet by mouth daily.    . magnesium hydroxide (MILK OF MAGNESIA) 400 MG/5ML suspension Take 30 mLs by mouth at bedtime as needed for mild constipation.    . pantoprazole (PROTONIX) 40 MG tablet Take 40 mg by mouth daily.     No current facility-administered medications for this visit.     Family History  Problem Relation Age of Onset  . Diabetes Mother   . Hypertension Mother   . Cancer Father   . Colon cancer Neg Hx     Social History   Socioeconomic History  .  Marital status: Married    Spouse name: Not on file  . Number of children: Not on file  . Years of education: Not on file  . Highest education level: Not on file  Occupational History  . Not on file  Social Needs  . Financial resource strain: Not on file  . Food insecurity:    Worry: Not on file    Inability: Not on file  . Transportation needs:    Medical: Not on file    Non-medical: Not on file  Tobacco Use  . Smoking status: Heavy Tobacco Smoker    Packs/day: 0.50    Years: 30.00    Pack years: 15.00    Types: Cigarettes  . Smokeless tobacco: Never Used  . Tobacco comment:  down from 2 packs/day  Substance and Sexual Activity  . Alcohol use: No    Alcohol/week: 0.0 oz  . Drug use: No  . Sexual activity: Not on file  Lifestyle  . Physical activity:    Days per week: Not on file    Minutes per session: Not on file  . Stress: Not on file  Relationships  . Social connections:    Talks on phone: Not on file    Gets together: Not on file    Attends religious service: Not on file    Active member of club or organization: Not on file    Attends meetings of clubs or organizations: Not on file    Relationship status: Not on file  . Intimate partner violence:    Fear of current or ex partner: Not on file    Emotionally abused: Not on file    Physically abused: Not on file    Forced sexual activity: Not on file  Other Topics Concern  . Not on file  Social History Narrative  . Not on file     REVIEW OF SYSTEMS:   [X]  denotes positive finding, [ ]  denotes negative finding Cardiac  Comments:  Chest pain or chest pressure:    Shortness of breath upon exertion:    Short of breath when lying flat:    Irregular heart rhythm:        Vascular    Pain in calf, thigh, or hip brought on by ambulation:    Pain in feet at night that wakes you up from your sleep:     Blood clot in your veins:    Leg swelling:         Pulmonary    Oxygen at home:    Productive cough:       Wheezing:  Neurologic    Sudden weakness in arms or legs:     Sudden numbness in arms or legs:     Sudden onset of difficulty speaking or slurred speech:    Temporary loss of vision in one eye:     Problems with dizziness:         Gastrointestinal    Blood in stool:     Vomited blood:         Genitourinary    Burning when urinating:     Blood in urine:        Psychiatric    Major depression:         Hematologic    Bleeding problems:    Problems with blood clotting too easily:        Skin    Rashes or ulcers:        Constitutional    Fever or chills:      PHYSICAL EXAMINATION:  Vitals:   08/06/17 1349 08/06/17 1350  BP: (!) 204/89 (!) 200/85  Pulse: 61   Resp: 20   SpO2: 99%    Vitals:   08/06/17 1349  Weight: 169 lb 4.8 oz (76.8 kg)  Height: 5\' 7"  (1.702 m)   Body mass index is 26.52 kg/m.  General:  WDWN in NAD; vital signs documented above Gait: Not observed HENT: WNL, normocephalic Pulmonary: normal non-labored breathing , without Rales, rhonchi,  wheezing Cardiac: regular HR, without  Murmurs without carotid bruits Abdomen: soft, NT, no masses Skin: without rashes Vascular Exam/Pulses:  Right Left  Radial 2+ (normal) 2+ (normal)  Ulnar 1+ (weak) 1+ (weak)  Brachial 2+ (normal) 2+ (normal)   Extremities: Prosthesis in place RLE; left leg with swelling; unable to palpate pedal pulses.  Musculoskeletal: no muscle wasting or atrophy  Neurologic: A&O X 3;  No focal weakness or paresthesias are detected Psychiatric:  The pt has Normal affect.   Non-Invasive Vascular Imaging:   BUE vein mapping 1/0/25: Right cephalic:  8.52DP-8.24MP Right basilic:  5.36RW-4.31VQ Left cephalic:  0.08QP-6.19JK Left baslic:  9.32IZ-1.24PY  Upper extremity arterial study 08/06/17: Right: Brachial:  0.49cm (T) Radial:  0.21cm (B) calcified Ulnar:  0.23cm (B) calcified  Left:   Brachial:  0.54cm (T) Radial:  0.21cm (B) calcified Ulnar:  0.16cm (B)  calcified    Pt meds includes: Statin:  No. Beta Blocker:  Yes.   Aspirin:  No. ACEI:  No. ARB:  No. CCB use:  Yes Other Antiplatelet/Anticoagulant:  No   ASSESSMENT/PLAN:: 62 y.o. male with CKD 4 in need of permanent dialysis access   -pt is right hand dominant and his cephalic vein in the left arm is adequate for left arm brachial cephalic AV fistula.   -discussed risks/benefits with pt -will schedule for May 2nd with Dr. Zada Girt, PA-C [facility] [phone]  Clinic MD:  Pt seen and examined with Dr. Trula Slade   I agree with the above.  Have seen and evaluated the patient.  He is well-known to me having undergone aortobifemoral bypass graft.  He has not been followed up on in a few years as he has missed appointments.  He is now here today for dialysis access.  He had vein mapping that showed an adequate left cephalic vein.  Arterial duplex showed calcified radial arteries.  He has palpable radial brachial pulse.  I recommended proceeding with a left brachiocephalic fistula.  The risks and benefits of the operation were discussed the patient including the  risk of non-maturity, the need for future interventions, and the risk of steal syndrome.  This procedure has been scheduled for Thursday, May 2  Annamarie Major

## 2017-08-28 ENCOUNTER — Other Ambulatory Visit: Payer: Self-pay

## 2017-08-28 ENCOUNTER — Encounter (HOSPITAL_COMMUNITY): Payer: Self-pay | Admitting: *Deleted

## 2017-08-28 NOTE — Progress Notes (Addendum)
Mr Stracke denies chest pain or shortness of breath.  Patient is a client of [facility].  Patient's PCP is Dr Bradd Burner.  I faxed a request to [facility] requesting last office note, labs and EKG.  Mr Broyles reports that he has not had an EKG since he was here for surgery - 2017. Patient has diabetes, he checks CBG every other day and it is checked at [facility]. Patient reports that it is usually 170 - 180, has been as low as 80 and as high as 400's. Patient does not recall any A1C numbers.  I instructed patient to check CBG the morning of surgery and if CBG > 70 to take 10 units of Lantus Insulin.  I instructed patient to check CBG after awaking and every 2 hours until arrival  to the hospital.  I Instructed patient if CBG is less than 70 to drink 1/2 cup of a clear juice- apple or cranberry. Recheck CBG in 15 minutes then call pre- op desk at [phone] for further instructions. If scheduled to receive Insulin, do not take Insulin.  Mr Treptow reported that his medication all come packaged, am and pm medications. I instructed patient to take medications on Wednesday, but none on Thursday.  Mr Abreu is going to confirm that [facility] is transporting him here when he goes tomorrow.  Patient stated that his wife will not be home until after 5 pm.  Patient said, I may be able to go to [facility] after surgery, he is going to check tomorrow.

## 2017-08-30 ENCOUNTER — Encounter (HOSPITAL_COMMUNITY)
Admission: RE | Disposition: A | Discharge status: Home / Self Care | Payer: Self-pay | Source: Ambulatory Visit | Attending: Surgery

## 2017-08-30 ENCOUNTER — Ambulatory Visit (HOSPITAL_COMMUNITY)
Admission: RE | Admit: 2017-08-30 | Discharge: 2017-08-30 | Disposition: A | Discharge status: Home / Self Care | Payer: Medicare (Managed Care) | Source: Ambulatory Visit | Attending: Surgery | Admitting: Surgery

## 2017-08-30 ENCOUNTER — Telehealth: Payer: Self-pay | Admitting: Surgery

## 2017-08-30 ENCOUNTER — Ambulatory Visit (HOSPITAL_COMMUNITY): Payer: Medicare (Managed Care) | Admitting: Emergency Medicine

## 2017-08-30 ENCOUNTER — Encounter (HOSPITAL_COMMUNITY): Payer: Self-pay | Admitting: Surgery

## 2017-08-30 ENCOUNTER — Other Ambulatory Visit: Payer: Self-pay

## 2017-08-30 DIAGNOSIS — Z79899 Other long term (current) drug therapy: Secondary | ICD-10-CM | POA: Diagnosis not present

## 2017-08-30 DIAGNOSIS — I129 Hypertensive chronic kidney disease with stage 1 through stage 4 chronic kidney disease, or unspecified chronic kidney disease: Secondary | ICD-10-CM | POA: Insufficient documentation

## 2017-08-30 DIAGNOSIS — N189 Chronic kidney disease, unspecified: Secondary | ICD-10-CM

## 2017-08-30 DIAGNOSIS — K219 Gastro-esophageal reflux disease without esophagitis: Secondary | ICD-10-CM | POA: Insufficient documentation

## 2017-08-30 DIAGNOSIS — F17210 Nicotine dependence, cigarettes, uncomplicated: Secondary | ICD-10-CM | POA: Insufficient documentation

## 2017-08-30 DIAGNOSIS — E1151 Type 2 diabetes mellitus with diabetic peripheral angiopathy without gangrene: Secondary | ICD-10-CM | POA: Insufficient documentation

## 2017-08-30 DIAGNOSIS — Z89412 Acquired absence of left great toe: Secondary | ICD-10-CM | POA: Insufficient documentation

## 2017-08-30 DIAGNOSIS — Z794 Long term (current) use of insulin: Secondary | ICD-10-CM | POA: Diagnosis not present

## 2017-08-30 DIAGNOSIS — Z7982 Long term (current) use of aspirin: Secondary | ICD-10-CM | POA: Insufficient documentation

## 2017-08-30 DIAGNOSIS — E1122 Type 2 diabetes mellitus with diabetic chronic kidney disease: Secondary | ICD-10-CM | POA: Diagnosis present

## 2017-08-30 HISTORY — PX: AV FISTULA PLACEMENT: SHX1204

## 2017-08-30 HISTORY — DX: Chronic kidney disease, unspecified: N18.9

## 2017-08-30 LAB — POCT I-STAT 4, (NA,K, GLUC, HGB,HCT)
Glucose, Bld: 182 mg/dL — ABNORMAL HIGH (ref 65–99)
HEMATOCRIT: 34 % — AB (ref 39.0–52.0)
HEMOGLOBIN: 11.6 g/dL — AB (ref 13.0–17.0)
POTASSIUM: 4 mmol/L (ref 3.5–5.1)
SODIUM: 143 mmol/L (ref 135–145)

## 2017-08-30 LAB — GLUCOSE, CAPILLARY: GLUCOSE-CAPILLARY: 179 mg/dL — AB (ref 65–99)

## 2017-08-30 SURGERY — ARTERIOVENOUS (AV) FISTULA CREATION
Anesthesia: Monitor Anesthesia Care | Site: Arm Lower | Laterality: Left

## 2017-08-30 MED ORDER — ONDANSETRON HCL 4 MG/2ML IJ SOLN
INTRAMUSCULAR | Status: AC
  Filled 2017-08-30: qty 2

## 2017-08-30 MED ORDER — PHENYLEPHRINE 40 MCG/ML (10ML) SYRINGE FOR IV PUSH (FOR BLOOD PRESSURE SUPPORT)
PREFILLED_SYRINGE | INTRAVENOUS | Status: AC
  Filled 2017-08-30: qty 10

## 2017-08-30 MED ORDER — CEFAZOLIN SODIUM 1 G IJ SOLR
INTRAMUSCULAR | Status: AC
  Filled 2017-08-30: qty 20

## 2017-08-30 MED ORDER — FENTANYL CITRATE (PF) 100 MCG/2ML IJ SOLN
INTRAMUSCULAR | Status: DC | PRN
  Administered 2017-08-30 (×2): 50 ug via INTRAVENOUS

## 2017-08-30 MED ORDER — HEPARIN SODIUM (PORCINE) 1000 UNIT/ML IJ SOLN
INTRAMUSCULAR | Status: AC
  Filled 2017-08-30: qty 1

## 2017-08-30 MED ORDER — MIDAZOLAM HCL 2 MG/2ML IJ SOLN
INTRAMUSCULAR | Status: AC
  Filled 2017-08-30: qty 2

## 2017-08-30 MED ORDER — LIDOCAINE 2% (20 MG/ML) 5 ML SYRINGE
INTRAMUSCULAR | Status: AC
  Filled 2017-08-30: qty 5

## 2017-08-30 MED ORDER — MIDAZOLAM HCL 2 MG/2ML IJ SOLN
INTRAMUSCULAR | Status: DC | PRN
  Administered 2017-08-30: 1 mg via INTRAVENOUS

## 2017-08-30 MED ORDER — LIDOCAINE-EPINEPHRINE (PF) 1 %-1:200000 IJ SOLN
INTRAMUSCULAR | Status: DC | PRN
  Administered 2017-08-30: 5 mL

## 2017-08-30 MED ORDER — LIDOCAINE-EPINEPHRINE (PF) 1 %-1:200000 IJ SOLN
INTRAMUSCULAR | Status: AC
  Filled 2017-08-30: qty 30

## 2017-08-30 MED ORDER — PHENYLEPHRINE 40 MCG/ML (10ML) SYRINGE FOR IV PUSH (FOR BLOOD PRESSURE SUPPORT)
PREFILLED_SYRINGE | INTRAVENOUS | Status: DC | PRN
  Administered 2017-08-30 (×2): 80 ug via INTRAVENOUS

## 2017-08-30 MED ORDER — HEPARIN SODIUM (PORCINE) 1000 UNIT/ML IJ SOLN
INTRAMUSCULAR | Status: DC | PRN
  Administered 2017-08-30: 3000 [IU] via INTRAVENOUS

## 2017-08-30 MED ORDER — CEFAZOLIN SODIUM-DEXTROSE 2-4 GM/100ML-% IV SOLN
2.0000 g | INTRAVENOUS | Status: AC
  Administered 2017-08-30: 2 g via INTRAVENOUS

## 2017-08-30 MED ORDER — CEFAZOLIN SODIUM-DEXTROSE 2-4 GM/100ML-% IV SOLN
INTRAVENOUS | Status: AC
  Filled 2017-08-30: qty 100

## 2017-08-30 MED ORDER — PROPOFOL 10 MG/ML IV BOLUS
INTRAVENOUS | Status: AC
  Filled 2017-08-30: qty 20

## 2017-08-30 MED ORDER — PROPOFOL 500 MG/50ML IV EMUL
INTRAVENOUS | Status: DC | PRN
  Administered 2017-08-30: 75 ug/kg/min via INTRAVENOUS

## 2017-08-30 MED ORDER — PROTAMINE SULFATE 10 MG/ML IV SOLN
INTRAVENOUS | Status: DC | PRN
  Administered 2017-08-30: 25 mg via INTRAVENOUS

## 2017-08-30 MED ORDER — ROCURONIUM BROMIDE 10 MG/ML (PF) SYRINGE
PREFILLED_SYRINGE | INTRAVENOUS | Status: AC
  Filled 2017-08-30: qty 5

## 2017-08-30 MED ORDER — SODIUM CHLORIDE 0.9 % IV SOLN
INTRAVENOUS | Status: DC
  Administered 2017-08-30: 07:00:00 via INTRAVENOUS

## 2017-08-30 MED ORDER — METOPROLOL SUCCINATE ER 100 MG PO TB24
100.0000 mg | ORAL_TABLET | Freq: Once | ORAL | Status: AC
  Administered 2017-08-30: 100 mg via ORAL
  Filled 2017-08-30: qty 1

## 2017-08-30 MED ORDER — EPHEDRINE 5 MG/ML INJ
INTRAVENOUS | Status: AC
  Filled 2017-08-30: qty 10

## 2017-08-30 MED ORDER — SODIUM CHLORIDE 0.9 % IV SOLN
INTRAVENOUS | Status: DC | PRN
  Administered 2017-08-30: 07:00:00

## 2017-08-30 MED ORDER — DEXAMETHASONE SODIUM PHOSPHATE 10 MG/ML IJ SOLN
INTRAMUSCULAR | Status: DC | PRN
  Administered 2017-08-30: 5 mg via INTRAVENOUS

## 2017-08-30 MED ORDER — PROPOFOL 1000 MG/100ML IV EMUL
INTRAVENOUS | Status: AC
  Filled 2017-08-30: qty 100

## 2017-08-30 MED ORDER — ONDANSETRON HCL 4 MG/2ML IJ SOLN
INTRAMUSCULAR | Status: DC | PRN
  Administered 2017-08-30: 4 mg via INTRAVENOUS

## 2017-08-30 MED ORDER — FENTANYL CITRATE (PF) 250 MCG/5ML IJ SOLN
INTRAMUSCULAR | Status: AC
  Filled 2017-08-30: qty 5

## 2017-08-30 MED ORDER — HEMOSTATIC AGENTS (NO CHARGE) OPTIME
TOPICAL | Status: DC | PRN
  Administered 2017-08-30: 1 via TOPICAL

## 2017-08-30 MED ORDER — DEXAMETHASONE SODIUM PHOSPHATE 10 MG/ML IJ SOLN
INTRAMUSCULAR | Status: AC
  Filled 2017-08-30: qty 1

## 2017-08-30 MED ORDER — CHLORHEXIDINE GLUCONATE 4 % EX LIQD: 60.0000 mL | Freq: Once | CUTANEOUS | Status: DC

## 2017-08-30 MED ORDER — 0.9 % SODIUM CHLORIDE (POUR BTL) OPTIME
TOPICAL | Status: DC | PRN
  Administered 2017-08-30: 1000 mL

## 2017-08-30 MED ORDER — HEPARIN SODIUM (PORCINE) 5000 UNIT/ML IJ SOLN
INTRAMUSCULAR | Status: AC
  Filled 2017-08-30: qty 1.2

## 2017-08-30 MED ORDER — TRAMADOL HCL 50 MG PO TABS: 50.0000 mg | ORAL_TABLET | Freq: Four times a day (QID) | ORAL | 0 refills | Status: DC | PRN

## 2017-08-30 SURGICAL SUPPLY — 36 items
ADH SKN CLS APL DERMABOND .7 (GAUZE/BANDAGES/DRESSINGS) ×1
ARMBAND PINK RESTRICT EXTREMIT (MISCELLANEOUS) ×6 IMPLANT
CANISTER SUCT 3000ML PPV (MISCELLANEOUS) ×3 IMPLANT
CLIP VESOCCLUDE MED 6/CT (CLIP) ×3 IMPLANT
CLIP VESOCCLUDE SM WIDE 6/CT (CLIP) ×3 IMPLANT
COVER PROBE W GEL 5X96 (DRAPES) ×3 IMPLANT
DERMABOND ADVANCED (GAUZE/BANDAGES/DRESSINGS) ×2
DERMABOND ADVANCED .7 DNX12 (GAUZE/BANDAGES/DRESSINGS) ×1 IMPLANT
ELECT REM PT RETURN 9FT ADLT (ELECTROSURGICAL) ×3
ELECTRODE REM PT RTRN 9FT ADLT (ELECTROSURGICAL) ×1 IMPLANT
GLOVE BIO SURGEON STRL SZ7 (GLOVE) ×3 IMPLANT
GLOVE BIOGEL PI IND STRL 7.0 (GLOVE) ×1 IMPLANT
GLOVE BIOGEL PI IND STRL 7.5 (GLOVE) ×2 IMPLANT
GLOVE BIOGEL PI IND STRL 8 (GLOVE) ×1 IMPLANT
GLOVE BIOGEL PI INDICATOR 7.0 (GLOVE) ×2
GLOVE BIOGEL PI INDICATOR 7.5 (GLOVE) ×4
GLOVE BIOGEL PI INDICATOR 8 (GLOVE) ×2
GLOVE ECLIPSE 6.5 STRL STRAW (GLOVE) ×3 IMPLANT
GLOVE SURG SS PI 7.5 STRL IVOR (GLOVE) ×3 IMPLANT
GOWN STRL REUS W/ TWL LRG LVL3 (GOWN DISPOSABLE) ×2 IMPLANT
GOWN STRL REUS W/ TWL XL LVL3 (GOWN DISPOSABLE) ×1 IMPLANT
GOWN STRL REUS W/TWL LRG LVL3 (GOWN DISPOSABLE) ×6
GOWN STRL REUS W/TWL XL LVL3 (GOWN DISPOSABLE) ×3
HEMOSTAT SNOW SURGICEL 2X4 (HEMOSTASIS) ×3 IMPLANT
KIT BASIN OR (CUSTOM PROCEDURE TRAY) ×3 IMPLANT
KIT TURNOVER KIT B (KITS) ×3 IMPLANT
NS IRRIG 1000ML POUR BTL (IV SOLUTION) ×3 IMPLANT
PACK CV ACCESS (CUSTOM PROCEDURE TRAY) ×3 IMPLANT
PAD ARMBOARD 7.5X6 YLW CONV (MISCELLANEOUS) ×6 IMPLANT
SUT PROLENE 6 0 CC (SUTURE) ×6 IMPLANT
SUT VIC AB 3-0 SH 27 (SUTURE) ×6
SUT VIC AB 3-0 SH 27X BRD (SUTURE) ×2 IMPLANT
SUT VICRYL 4-0 PS2 18IN ABS (SUTURE) ×3 IMPLANT
TOWEL GREEN STERILE (TOWEL DISPOSABLE) ×3 IMPLANT
UNDERPAD 30X30 (UNDERPADS AND DIAPERS) ×3 IMPLANT
WATER STERILE IRR 1000ML POUR (IV SOLUTION) ×3 IMPLANT

## 2017-08-30 NOTE — Transfer of Care (Signed)
Immediate Anesthesia Transfer of Care Note  Patient: Gary [name]  Procedure(s) Performed: CREATION OF LEFT BRACHIOCEPHALIC ARTERIOVENOUS FISTULA (Left Arm Lower)  Patient Location: PACU  Anesthesia Type:MAC  Level of Consciousness: drowsy and patient cooperative  Airway & Oxygen Therapy: Patient Spontanous Breathing  Post-op Assessment: Report given to RN, Post -op Vital signs reviewed and stable and Patient moving all extremities X 4  Post vital signs: Reviewed and stable  Last Vitals:  Vitals Value Taken Time  BP 158/75 08/30/2017  8:57 AM  Temp    Pulse 63 08/30/2017  8:57 AM  Resp 17 08/30/2017  8:57 AM  SpO2 96 % 08/30/2017  8:57 AM  Vitals shown include unvalidated device data.  Last Pain:  Vitals:   08/30/17 0603  TempSrc:   PainSc: 0-No pain      Patients Stated Pain Goal: 3 (41/66/06 3016)  Complications: No apparent anesthesia complications

## 2017-08-30 NOTE — Anesthesia Preprocedure Evaluation (Signed)
Anesthesia Evaluation  Patient identified by MRN, date of birth, ID band Patient awake    Reviewed: Allergy & Precautions, NPO status , Patient's Chart, lab work & pertinent test results  Airway Mallampati: I  TM Distance: >3 FB Neck ROM: Full    Dental   Pulmonary Current Smoker,    Pulmonary exam normal        Cardiovascular hypertension, Pt. on medications Normal cardiovascular exam     Neuro/Psych    GI/Hepatic GERD  Medicated and Controlled,  Endo/Other  diabetes, Type 2, Insulin Dependent  Renal/GU Renal InsufficiencyRenal disease     Musculoskeletal   Abdominal   Peds  Hematology   Anesthesia Other Findings   Reproductive/Obstetrics                             Anesthesia Physical Anesthesia Plan  ASA: III  Anesthesia Plan: MAC   Post-op Pain Management:    Induction: Intravenous  PONV Risk Score and Plan: 0  Airway Management Planned: Simple Face Mask  Additional Equipment:   Intra-op Plan:   Post-operative Plan:   Informed Consent: I have reviewed the patients History and Physical, chart, labs and discussed the procedure including the risks, benefits and alternatives for the proposed anesthesia with the patient or authorized representative who has indicated his/her understanding and acceptance.     Plan Discussed with: CRNA and Surgeon  Anesthesia Plan Comments:         Anesthesia Quick Evaluation

## 2017-08-30 NOTE — Op Note (Signed)
    Patient name: Gary [name] MRN: [id number] DOB: [birth year] Sex: male  08/30/2017 Pre-operative Diagnosis: CKD Post-operative diagnosis:  Same Surgeon:  Annamarie Major Assistants:  Alwyn Ren Procedure:   Left brachiocephalic fistula  Anesthesia: MAC Blood Loss: Minimal Specimens: None  Findings: Small 2 mm brachial artery.  Cephalic vein was 3-4 mm  Indications: Patient is not yet on dialysis but is here for access planning.  He had an adequate basilic vein.  Radial arteries were really calcified  Procedure:  The patient was identified in the holding area and taken to [place] [facility]  The patient was then placed supine on the table. MAC anesthesia was administered.  The patient was prepped and draped in the usual sterile fashion.  A time out was called and antibiotics were administered.  1% lidocaine was used for local anesthesia.  Ultrasound was used to evaluate the cephalic vein in the upper arm it appeared to be widely patent and easily compressible.  A transverse incision was made at the antecubital crease.  Cautery was used about subtenons tissue.  The cephalic vein was identified and mobilized throughout the length of the incision.  There were 2 branches distally which were individually ligated.  The vein was approximately 3-4 mm.  I then dissected out the brachial artery.  This was a rather small 2-2.5 mm artery.  It was fully mobilized.  Once adequate exposure 3000 units of heparin were given.  The vein was marked for orientation and then ligated distally.  It distended nicely with heparin saline.  Serafin clamps were placed on the brachial artery which was opened with a #11 blade and extended longitudinally with Potts scissors.  The vein was cut to the appropriate length and spatulated to fit the size of the arteriotomy.  A running anastomosis was created with 6-0 Prolene.  Prior to completion the appropriate flushing maneuvers were performed and the anastomosis was completed.  There is  a good thrill within the fistula.  The patient had a mono-biphasic radial artery signal and a brisk ulnar artery signal.  2 sutures were required and to the cephalic vein itself for bleeding from the vein.  Once I was satisfied with hemostasis, the incision was closed with 2 layers of 3-0 Vicryl followed by Dermabond.  There were no immediate complications.   Disposition: To PACU stable   V. Annamarie Major, M.D. [facility] Office: [phone] Pager:  [phone]

## 2017-08-30 NOTE — Interval H&P Note (Signed)
History and Physical Interval Note:  08/30/2017 7:21 AM  Gary [name]  has presented today for surgery, with the diagnosis of CHRONIC KIDNEY DISEASE FOR HEMODIALYSIS ACCESS  The various methods of treatment have been discussed with the patient and family. After consideration of risks, benefits and other options for treatment, the patient has consented to  Procedure(s): CREATION OF LEFT BRACHIOCEPHALIC ARTERIOVENOUS FISTULA (Left) as a surgical intervention .  The patient's history has been reviewed, patient examined, no change in status, stable for surgery.  I have reviewed the patient's chart and labs.  Questions were answered to the patient's satisfaction.     Annamarie Major

## 2017-08-30 NOTE — Anesthesia Postprocedure Evaluation (Signed)
Anesthesia Post Note  Patient: Gary [name]  Procedure(s) Performed: CREATION OF LEFT BRACHIOCEPHALIC ARTERIOVENOUS FISTULA (Left Arm Lower)     Patient location during evaluation: PACU Anesthesia Type: MAC Level of consciousness: awake and alert Pain management: pain level controlled Vital Signs Assessment: post-procedure vital signs reviewed and stable Respiratory status: spontaneous breathing, nonlabored ventilation, respiratory function stable and patient connected to nasal cannula oxygen Cardiovascular status: stable and blood pressure returned to baseline Postop Assessment: no apparent nausea or vomiting Anesthetic complications: no    Last Vitals:  Vitals:   08/30/17 0915 08/30/17 1225  BP: (!) 160/81 (!) 178/84  Pulse: 63 69  Resp: 13 13  Temp:    SpO2: 99% 100%    Last Pain:  Vitals:   08/30/17 0603  TempSrc:   PainSc: 0-No pain                 Birdia Jaycox DAVID

## 2017-08-30 NOTE — Telephone Encounter (Signed)
Sched appt 10/01/17; lab at 1:30 and MD at 2:30. Lm on hm# and with Mariel Aloe at [facility] to inform them of appt.

## 2017-08-30 NOTE — Telephone Encounter (Signed)
-----   Message from Mena Goes, RN sent at 08/30/2017  9:17 AM EDT ----- Regarding: 4-6 weeks w/ duplex   ----- Message ----- From: Marquis Lunch Sent: 08/30/2017   8:54 AM To: [facility] Charge Pool  S/p left brachiocephalic AVF by Dr. Trula Slade. Follow up in office with duplex in 4-6 weeks.

## 2017-08-30 NOTE — Discharge Instructions (Signed)
° °  [facility]  Discharge Instructions  AV Fistula Surgery for Dialysis Access  Please refer to the following instructions for your post-procedure care. Your surgeon or physician assistant will discuss any changes with you.  Activity  You may drive the day following your surgery, if you are comfortable and no longer taking prescription pain medication. Resume full activity as the soreness in your incision resolves.  Bathing/Showering  You may shower after you go home. Keep your incision dry for 48 hours. Do not soak in a bathtub, hot tub, or swim until the incision heals completely. You may not shower if you have a hemodialysis catheter.  Incision Care  Clean your incision with mild soap and water after 48 hours. Pat the area dry with a clean towel. You do not need a bandage unless otherwise instructed. Do not apply any ointments or creams to your incision. You may have skin glue on your incision. Do not peel it off. It will come off on its own in about one week. Your arm may swell a bit after surgery. To reduce swelling use pillows to elevate your arm so it is above your heart. Your doctor will tell you if you need to lightly wrap your arm with an ACE bandage.  Diet  Resume your normal diet. There are not special food restrictions following this procedure. In order to heal from your surgery, it is CRITICAL to get adequate nutrition. Your body requires vitamins, minerals, and protein. Vegetables are the best source of vitamins and minerals. Vegetables also provide the perfect balance of protein. Processed food has little nutritional value, so try to avoid this.  Medications  Resume taking all of your medications. If your incision is causing pain, you may take over-the counter pain relievers such as acetaminophen (Tylenol). If you were prescribed a stronger pain medication, please be aware these medications can cause nausea and constipation. Prevent nausea by  taking the medication with a snack or meal. Avoid constipation by drinking plenty of fluids and eating foods with high amount of fiber, such as fruits, vegetables, and grains.  Do not take Tylenol if you are taking prescription pain medications.  Follow up Your surgeon may want to see you in the office following your access surgery. If so, this will be arranged at the time of your surgery.  Please call us immediately for any of the following conditions:  Increased pain, redness, drainage (pus) from your incision site Fever of 101 degrees or higher Severe or worsening pain at your incision site Hand pain or numbness.  Reduce your risk of vascular disease:  Stop smoking. If you would like help, call [facility] at 1-800-QUIT-NOW (901)537-8266) or [place] at [place] your cholesterol Maintain a desired weight Control your diabetes Keep your blood pressure down  Dialysis  It will take several weeks to several months for your new dialysis access to be ready for use. Your surgeon will determine when it is okay to use it. Your nephrologist will continue to direct your dialysis. You can continue to use your Permcath until your new access is ready for use.   08/30/2017 Gary [name] [id number] 05/01/1956  Surgeon(s): Serafina Mitchell, MD  Procedure(s): CREATION OF LEFT BRACHIOCEPHALIC ARTERIOVENOUS FISTULA  x Do not stick fistula for 12 weeks    If you have any questions, please call the office at [phone].

## 2017-08-31 ENCOUNTER — Encounter (HOSPITAL_COMMUNITY): Payer: Self-pay | Admitting: Surgery

## 2017-08-31 ENCOUNTER — Other Ambulatory Visit: Payer: Self-pay

## 2017-08-31 DIAGNOSIS — N184 Chronic kidney disease, stage 4 (severe): Secondary | ICD-10-CM

## 2017-08-31 DIAGNOSIS — Z48812 Encounter for surgical aftercare following surgery on the circulatory system: Secondary | ICD-10-CM

## 2017-10-01 ENCOUNTER — Ambulatory Visit (INDEPENDENT_AMBULATORY_CARE_PROVIDER_SITE_OTHER): Payer: Medicare (Managed Care) | Admitting: Surgery

## 2017-10-01 ENCOUNTER — Encounter: Payer: Self-pay | Admitting: Surgery

## 2017-10-01 ENCOUNTER — Ambulatory Visit (HOSPITAL_COMMUNITY)
Admission: RE | Admit: 2017-10-01 | Discharge: 2017-10-01 | Disposition: A | Discharge status: Home / Self Care | Payer: Medicare (Managed Care) | Source: Ambulatory Visit | Attending: Surgery | Admitting: Surgery

## 2017-10-01 VITALS — BP 171/81 | HR 58 | Temp 97.0°F | Resp 18 | Ht 67.0 in | Wt 160.0 lb

## 2017-10-01 DIAGNOSIS — N184 Chronic kidney disease, stage 4 (severe): Secondary | ICD-10-CM

## 2017-10-01 DIAGNOSIS — Z48812 Encounter for surgical aftercare following surgery on the circulatory system: Secondary | ICD-10-CM

## 2017-10-01 NOTE — Progress Notes (Signed)
                                     Patient name: Gary [name] MRN: [id number] DOB: 28-Jul-1955 Sex: male  REASON FOR VISIT:     post-op  HISTORY OF PRESENT ILLNESS:   Gary [name] is a 62 y.o. male who returns today for follow-up.  He is status post left brachiocephalic fistula creation on 08/30/2017.  He is not yet on dialysis.  He denies any symptoms of steal.  CURRENT MEDICATIONS:    Current Outpatient Medications  Medication Sig Dispense Refill  . amLODipine (NORVASC) 10 MG tablet Take 10 mg by mouth daily.    Marland Kitchen aspirin EC 81 MG tablet Take 81 mg by mouth daily.    . bisacodyl (DULCOLAX) 5 MG EC tablet Take 5 mg by mouth daily.    . cloNIDine (CATAPRES - DOSED IN MG/24 HR) 0.3 mg/24hr patch Place 0.3 mg onto the skin every Monday.     . Evolocumab (REPATHA SURECLICK) 250 MG/ML SOAJ Inject into the skin.    . furosemide (LASIX) 40 MG tablet Take 80 mg by mouth daily.    . Insulin Glargine (LANTUS SOLOSTAR) 100 UNIT/ML Solostar Pen Inject 20 Units into the skin daily.     Marland Kitchen loratadine (CLARITIN) 10 MG tablet Take 10 mg by mouth daily.    . metoprolol succinate (TOPROL-XL) 100 MG 24 hr tablet Take 100 mg by mouth daily. Take with or immediately following a meal.    . ranitidine (ZANTAC) 150 MG tablet Take 150 mg by mouth daily.    . rosuvastatin (CRESTOR) 5 MG tablet Take 5 mg by mouth daily.    Marland Kitchen senna-docusate (SENNA-PLUS) 8.6-50 MG tablet Take 1 tablet by mouth 2 (two) times daily.    . sodium bicarbonate 650 MG tablet Take 1,300 mg by mouth 2 (two) times daily.      No current facility-administered medications for this visit.     REVIEW OF SYSTEMS:   [X]  denotes positive finding, [ ]  denotes negative finding Cardiac  Comments:  Chest pain or chest pressure:    Shortness of breath upon exertion:    Short of breath when lying flat:    Irregular heart rhythm:    Constitutional    Fever or chills:      PHYSICAL EXAM:   Vitals:   10/01/17 1504 10/01/17 1508  BP:  (!) 171/80 (!) 171/81  Pulse: (!) 58   Resp: 18   Temp: (!) 97 F (36.1 C)   TempSrc: Oral   SpO2: 98%   Weight: 160 lb (72.6 kg)   Height: 5\' 7"  (1.702 m)     GENERAL: The patient is a well-nourished male, in no acute distress. The vital signs are documented above. CARDIOVASCULAR: There is a regular rate and rhythm. PULMONARY: Non-labored respirations Palpable thrill within fistula from the antecubital crease up to the shoulder  STUDIES:   Ultrasound shows that the diameters range from 0.57-0.64.  Depths ranged from 0.32-0.69   MEDICAL ISSUES:   Maturing left brachiocephalic fistula.  This should be ready for use in August 2019.  Annamarie Major, MD [facility] [phone] Pager [phone]

## 2017-12-20 ENCOUNTER — Encounter (INDEPENDENT_AMBULATORY_CARE_PROVIDER_SITE_OTHER): Payer: Self-pay | Admitting: Orthopedic Surgery

## 2017-12-20 ENCOUNTER — Ambulatory Visit (INDEPENDENT_AMBULATORY_CARE_PROVIDER_SITE_OTHER): Payer: Medicare (Managed Care) | Admitting: Orthopedic Surgery

## 2017-12-20 VITALS — Ht 67.0 in | Wt 160.0 lb

## 2017-12-20 DIAGNOSIS — Z89511 Acquired absence of right leg below knee: Secondary | ICD-10-CM

## 2017-12-20 DIAGNOSIS — L97912 Non-pressure chronic ulcer of unspecified part of right lower leg with fat layer exposed: Secondary | ICD-10-CM | POA: Diagnosis not present

## 2017-12-20 NOTE — Progress Notes (Signed)
Office Visit Note   Patient: Gary [name]           Date of Birth: Apr 24, 1956           MRN: [id number] Visit Date: 12/20/2017              Requested by: Janifer Adie, MD [address] [place], [place] [address] PCP: Janifer Adie, MD  Chief Complaint  Patient presents with  . Right Leg - Follow-up    01/28/16 right BKA       HPI: Patient is a 62 year old gentleman who is status post transtibial amputation several years ago on the right.  Patient states that due to his poor fitting prosthesis he has been N bearing he has callus ulceration and a fluid collection.  He states he cannot wear his prosthesis at this time due to the swelling.  [facility] a [facility]  Assessment & Plan: Visit Diagnoses:  1. Status post below knee amputation of right lower extremity (HCC)   2. Leg ulcer, right, with fat layer exposed ([place])     Plan: Patient is given a prescription for [employer] for them to modify the socket either a new socket or padding proximally to unload pressure from the end of the residual limb.  Recommended a compression stump shrinker to help decrease the swelling.  Follow-Up Instructions: Return if symptoms worsen or fail to improve.   Ortho Exam  Patient is alert, oriented, no adenopathy, well-dressed, normal affect, normal respiratory effort. Examination patient has a callus and small ulcer over the end of the residual limb secondary to N bearing while ambulating.  There is some fluid collection secondary to the swelling there is no cellulitis no drainage no odor no signs of infection.  Patient also has a small callused area over the fibular head.  Imaging: No results found. No images are attached to the encounter.  Labs: Lab Results  Component Value Date   HGBA1C 13.3 (H) 01/18/2010   HGBA1C 14.7 (H) 06/15/2009   HGBA1C (H) 01/27/2008    14.1 (NOTE)   The ADA recommends the following therapeutic goal for glycemic   control related  to Hgb A1C measurement:   Goal of Therapy:   < 7.0% Hgb A1C   Reference: American Diabetes Association: Clinical Practice   Recommendations 2008, Diabetes Care,  2008, 31:(Suppl 1).   ESRSEDRATE 23 (H) 09/26/2012   CRP 2.8 (H) 09/26/2012   REPTSTATUS 02/02/2008 FINAL 01/27/2008   GRAMSTAIN  01/26/2008    RARE WBC PRESENT, PREDOMINANTLY PMN NO SQUAMOUS EPITHELIAL CELLS SEEN RARE GRAM POSITIVE COCCI IN PAIRS RARE GRAM NEGATIVE RODS   CULT NO GROWTH 5 DAYS 01/27/2008     Lab Results  Component Value Date   ALBUMIN 3.5 09/26/2012   ALBUMIN 4.0 11/11/2009   ALBUMIN 4.4 06/15/2009    Body mass index is 25.06 kg/m.  Orders:  No orders of the defined types were placed in this encounter.  No orders of the defined types were placed in this encounter.    Procedures: No procedures performed  Clinical Data: No additional findings.  ROS:  All other systems negative, except as noted in the HPI. Review of Systems  Objective: Vital Signs: Ht 5\' 7"  (1.702 m)   Wt 160 lb (72.6 kg)   BMI 25.06 kg/m   Specialty Comments:  No specialty comments available.  PMFS History: Patient Active Problem List   Diagnosis Date Noted  . Below knee amputation  status ([place]) 01/28/2016  . Aftercare following surgery of the circulatory system, [place] 12/01/2013  . Follow-up examination, following unspecified surgery 11/25/2012  . Atherosclerosis of native arteries of the extremities with ulceration(440.23) 11/25/2012  . 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 Pure hypercholesterolemia 09/26/2012  . Atherosclerosis of native arteries of the extremities, unspecified 09/26/2012  . Type II or unspecified type diabetes mellitus with neurological manifestations, not stated as uncontrolled(250.60) 09/26/2012  . Type II or unspecified type diabetes mellitus with ophthalmic manifestations, not stated as uncontrolled(250.50) 09/26/2012  . blurred vision due to refraction problems 09/26/2012    . poor dentition 09/26/2012  . poor dentition 09/26/2012  . Insomnia 09/26/2012  . Hypertension 09/26/2012  . Tobacco use disorder 09/26/2012  . Peripheral vascular disease 09/26/2012  . BKA right leg 09/26/2012  . Vitamin D deficiency aeb lab values of 16.5 ng/mL 09/26/2012  . Routine general medical examination at a health care facility 09/26/2012  . GERD (gastroesophageal reflux disease) 09/26/2012  . Sensorineural hearing loss (Per ENT) 09/26/2012  . constipation 09/26/2012  . Osteomyelitis of left great toe 09/26/2012  . Toe osteomyelitis, left ([place]) 09/26/2012  . 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 Blurred vision   . Chronic kidney disease    sees [facility]  . Constipation   . Constipation   . Diabetes mellitus    type 2  . GERD (gastroesophageal reflux disease)   . High cholesterol   . Hypertension   . Insomnia   . Peripheral artery disease ([place])   . [place] stain of face    Left V1 distribution, including upper eyelid  . Sensorineural hearing loss    left ear  . Vitamin D deficiency     Family History  Problem Relation Age of Onset  . Diabetes Mother   . Hypertension Mother   . Cancer Father   . Colon cancer Neg Hx     Past Surgical History:  Procedure Laterality Date  . AMPUTATION Right 07/17/2011  . AMPUTATION Left 10/03/2012   Procedure: AMPUTATION DIGIT;  Surgeon: Serafina Mitchell, MD;  [facility];  Service: Vascular;  Laterality: Left;  Amputation of left Great  toe  . AV FISTULA PLACEMENT Left 08/30/2017   Procedure: CREATION OF LEFT BRACHIOCEPHALIC ARTERIOVENOUS FISTULA;  Surgeon: Serafina Mitchell, MD;  Location: [facility];  Service: Vascular;  Laterality: Left;  . COLONOSCOPY    . PERIPHERAL ARTERIAL STENT GRAFT N/A 2010  . STUMP REVISION Right 01/28/2016   Procedure: Revision Right Below Knee Amputation;  Surgeon: Newt Minion, MD;  Location: [facility];  Service: Orthopedics;  Laterality: Right;    Social History   Occupational History  . Not on file  Tobacco Use  . Smoking status: Current Every Day Smoker    Packs/day: 0.75    Years: 30.00    Pack years: 22.50    Types: Cigarettes  . Smokeless tobacco: Never Used  . Tobacco comment:  down from 2 packs/day  Substance and Sexual Activity  . Alcohol use: No    Alcohol/week: 0.0 standard drinks  . Drug use: No  . Sexual activity: Not on file

## 2018-06-10 ENCOUNTER — Ambulatory Visit
Admission: RE | Admit: 2018-06-10 | Discharge: 2018-06-10 | Disposition: A | Discharge status: Home / Self Care | Payer: No Typology Code available for payment source | Source: Ambulatory Visit | Attending: Nurse Practitioner | Admitting: Nurse Practitioner

## 2018-06-10 ENCOUNTER — Other Ambulatory Visit: Payer: Self-pay | Admitting: Nurse Practitioner

## 2018-06-10 DIAGNOSIS — R0602 Shortness of breath: Secondary | ICD-10-CM

## 2018-06-10 DIAGNOSIS — R05 Cough: Secondary | ICD-10-CM

## 2018-06-10 DIAGNOSIS — R059 Cough, unspecified: Secondary | ICD-10-CM

## 2018-06-12 ENCOUNTER — Emergency Department (HOSPITAL_COMMUNITY)
Admission: EM | Admit: 2018-06-12 | Discharge: 2018-06-12 | Disposition: A | Discharge status: Home / Self Care | Payer: Medicare (Managed Care) | Attending: Emergency Medicine | Admitting: Emergency Medicine

## 2018-06-12 ENCOUNTER — Emergency Department (HOSPITAL_COMMUNITY): Payer: Medicare (Managed Care)

## 2018-06-12 ENCOUNTER — Other Ambulatory Visit: Payer: Self-pay

## 2018-06-12 ENCOUNTER — Encounter (HOSPITAL_COMMUNITY): Payer: Self-pay

## 2018-06-12 DIAGNOSIS — R05 Cough: Secondary | ICD-10-CM | POA: Insufficient documentation

## 2018-06-12 DIAGNOSIS — Z7982 Long term (current) use of aspirin: Secondary | ICD-10-CM | POA: Insufficient documentation

## 2018-06-12 DIAGNOSIS — I158 Other secondary hypertension: Secondary | ICD-10-CM | POA: Diagnosis not present

## 2018-06-12 DIAGNOSIS — E1169 Type 2 diabetes mellitus with other specified complication: Secondary | ICD-10-CM | POA: Insufficient documentation

## 2018-06-12 DIAGNOSIS — Z79899 Other long term (current) drug therapy: Secondary | ICD-10-CM | POA: Insufficient documentation

## 2018-06-12 DIAGNOSIS — R51 Headache: Secondary | ICD-10-CM

## 2018-06-12 DIAGNOSIS — F17210 Nicotine dependence, cigarettes, uncomplicated: Secondary | ICD-10-CM | POA: Diagnosis not present

## 2018-06-12 DIAGNOSIS — R519 Headache, unspecified: Secondary | ICD-10-CM

## 2018-06-12 LAB — CBC WITH DIFFERENTIAL/PLATELET
ABS IMMATURE GRANULOCYTES: 0.03 10*3/uL (ref 0.00–0.07)
Basophils Absolute: 0 10*3/uL (ref 0.0–0.1)
Basophils Relative: 0 %
EOS PCT: 0 %
Eosinophils Absolute: 0 10*3/uL (ref 0.0–0.5)
HEMATOCRIT: 36.5 % — AB (ref 39.0–52.0)
HEMOGLOBIN: 11.1 g/dL — AB (ref 13.0–17.0)
Immature Granulocytes: 1 %
LYMPHS ABS: 1.1 10*3/uL (ref 0.7–4.0)
LYMPHS PCT: 23 %
MCH: 23.8 pg — AB (ref 26.0–34.0)
MCHC: 30.4 g/dL (ref 30.0–36.0)
MCV: 78.3 fL — AB (ref 80.0–100.0)
MONO ABS: 1 10*3/uL (ref 0.1–1.0)
MONOS PCT: 21 %
NEUTROS ABS: 2.6 10*3/uL (ref 1.7–7.7)
Neutrophils Relative %: 55 %
Platelets: 221 10*3/uL (ref 150–400)
RBC: 4.66 MIL/uL (ref 4.22–5.81)
RDW: 12.9 % (ref 11.5–15.5)
WBC: 4.7 10*3/uL (ref 4.0–10.5)
nRBC: 0 % (ref 0.0–0.2)

## 2018-06-12 LAB — COMPREHENSIVE METABOLIC PANEL
ALBUMIN: 2.7 g/dL — AB (ref 3.5–5.0)
ALT: 9 U/L (ref 0–44)
AST: 14 U/L — ABNORMAL LOW (ref 15–41)
Alkaline Phosphatase: 70 U/L (ref 38–126)
Anion gap: 14 (ref 5–15)
BILIRUBIN TOTAL: 0.4 mg/dL (ref 0.3–1.2)
BUN: 69 mg/dL — ABNORMAL HIGH (ref 8–23)
CO2: 16 mmol/L — ABNORMAL LOW (ref 22–32)
Calcium: 8.1 mg/dL — ABNORMAL LOW (ref 8.9–10.3)
Chloride: 110 mmol/L (ref 98–111)
Creatinine, Ser: 9.41 mg/dL — ABNORMAL HIGH (ref 0.61–1.24)
GFR calc Af Amer: 6 mL/min — ABNORMAL LOW (ref >60)
GFR calc non Af Amer: 5 mL/min — ABNORMAL LOW (ref >60)
GLUCOSE: 207 mg/dL — AB (ref 70–99)
Potassium: 4.5 mmol/L (ref 3.5–5.1)
Sodium: 140 mmol/L (ref 135–145)
Total Protein: 5.6 g/dL — ABNORMAL LOW (ref 6.5–8.1)

## 2018-06-12 LAB — LACTIC ACID, PLASMA: Lactic Acid, Venous: 0.9 mmol/L (ref 0.5–1.9)

## 2018-06-12 LAB — INFLUENZA PANEL BY PCR (TYPE A & B)
Influenza A By PCR: NEGATIVE
Influenza B By PCR: NEGATIVE

## 2018-06-12 MED ORDER — METOCLOPRAMIDE HCL 5 MG/ML IJ SOLN
10.0000 mg | Freq: Once | INTRAMUSCULAR | Status: AC
  Administered 2018-06-12: 10 mg via INTRAVENOUS
  Filled 2018-06-12: qty 2

## 2018-06-12 MED ORDER — SODIUM CHLORIDE 0.9 % IV BOLUS
500.0000 mL | Freq: Once | INTRAVENOUS | Status: AC
  Administered 2018-06-12: 500 mL via INTRAVENOUS

## 2018-06-12 MED ORDER — SODIUM CHLORIDE 0.9 % IV BOLUS: 1000.0000 mL | Freq: Once | INTRAVENOUS | Status: DC

## 2018-06-12 MED ORDER — ACETAMINOPHEN 500 MG PO TABS
1000.0000 mg | ORAL_TABLET | Freq: Once | ORAL | Status: AC
Start: 2018-06-12 — End: 2018-06-12
  Administered 2018-06-12: 1000 mg via ORAL
  Filled 2018-06-12: qty 2

## 2018-06-12 MED ORDER — CLONIDINE HCL 0.3 MG/24HR TD PTWK
0.3000 mg | MEDICATED_PATCH | Freq: Once | TRANSDERMAL | Status: DC
  Administered 2018-06-12: 0.3 mg via TRANSDERMAL
  Filled 2018-06-12: qty 1

## 2018-06-12 NOTE — Discharge Instructions (Signed)
Continue to take your home blood pressure medications.  Also finish your antibiotic and make sure you are staying hydrated.  Tylenol is ok to take for the headache but avoid ibuprofen or aleve/naproxen

## 2018-06-12 NOTE — ED Triage Notes (Signed)
Pt c/o headache since 0430 & his BP was measured at [facility] & he was sent here to the ED with a SBP of 210.

## 2018-06-12 NOTE — ED Provider Notes (Signed)
[facility] EMERGENCY DEPARTMENT Provider Note   CSN: [id number] Arrival date & time: 06/12/18  1039     History   Chief Complaint Chief Complaint  Patient presents with  . Hypertension  . Headache    HPI COLLINS KERBY is a 63 y.o. male.  Patient is a 63 year old male with a history of diabetes, chronic kidney disease, peripheral artery disease presenting today with a headache that woke him up around 4:00 in the morning which he states is severe and a 7 out of 10.  He describes it as across the front of his face and behind his eyes.  Also for the last 3 days he has had cough, intermittent shortness of breath, occasional chills.  He saw his doctor on Monday and states he had an x-ray done.  He was diagnosed with bronchitis and started on an antibiotic and a cough medication.  He states for the last few days he has had minimal appetite and has not been eating normally.  He has vomited 2 times in the last 2 days but denies ongoing nausea vomiting or diarrhea.  He has no chest pain.  He states he does not usually get headaches but has not been sleeping very well.  He does not take anticoagulation and denies any head trauma.  He has had no vision changes but states his vision is always bad and he is planning on seeing the eye doctor tomorrow.  Because of patient's kidney disease he had a fistula placed in May but has not started on dialysis.  The history is provided by the patient.  Hypertension  Associated symptoms include headaches.  Headache    Past Medical History:  Diagnosis Date  . Blurred vision   . Chronic kidney disease    sees [facility]  . Constipation   . Constipation   . Diabetes mellitus    type 2  . GERD (gastroesophageal reflux disease)   . High cholesterol   . Hypertension   . Insomnia   . Peripheral artery disease ([place])   . Port-wine stain of face    Left V1 distribution, including upper eyelid  . Sensorineural hearing loss    left ear   . Vitamin D deficiency     Patient Active Problem List   Diagnosis Date Noted  . Below knee amputation status 01/28/2016  . Aftercare following surgery of the circulatory system, [place] 12/01/2013  . Follow-up examination, following unspecified surgery 11/25/2012  . Atherosclerosis of native arteries of the extremities with ulceration(440.23) 11/25/2012  . Atherosclerotic PVD with ulceration ([place]) 10/01/2012  . Pure hypercholesterolemia 09/26/2012  . Atherosclerosis of native arteries of the extremities, unspecified 09/26/2012  . Type II or unspecified type diabetes mellitus with neurological manifestations, not stated as uncontrolled(250.60) 09/26/2012  . Type II or unspecified type diabetes mellitus with ophthalmic manifestations, not stated as uncontrolled(250.50) 09/26/2012  . blurred vision due to refraction problems 09/26/2012  . poor dentition 09/26/2012  . poor dentition 09/26/2012  . Insomnia 09/26/2012  . Hypertension 09/26/2012  . Tobacco use disorder 09/26/2012  . Peripheral vascular disease 09/26/2012  . BKA right leg 09/26/2012  . Vitamin D deficiency aeb lab values of 16.5 ng/mL 09/26/2012  . Routine general medical examination at a health care facility 09/26/2012  . GERD (gastroesophageal reflux disease) 09/26/2012  . Sensorineural hearing loss (Per ENT) 09/26/2012  . constipation 09/26/2012  . Osteomyelitis of left great toe 09/26/2012  . Toe osteomyelitis, left ([place]) 09/26/2012  . 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 AMPUTATION Right 07/17/2011  . AMPUTATION Left 10/03/2012   Procedure: AMPUTATION DIGIT;  Surgeon: Serafina Mitchell, MD;  Location: [facility] OR;  Service: Vascular;  Laterality: Left;  Amputation of left Great  toe  . AV FISTULA PLACEMENT Left 08/30/2017   Procedure: CREATION OF LEFT BRACHIOCEPHALIC ARTERIOVENOUS FISTULA;  Surgeon: Serafina Mitchell, MD;  Location: [facility];  Service: Vascular;  Laterality:  Left;  . COLONOSCOPY    . PERIPHERAL ARTERIAL STENT GRAFT N/A 2010  . STUMP REVISION Right 01/28/2016   Procedure: Revision Right Below Knee Amputation;  Surgeon: Newt Minion, MD;  Location: [facility];  Service: Orthopedics;  Laterality: Right;        Home Medications    Prior to Admission medications   Medication Sig Start Date End Date Taking? Authorizing Provider  amLODipine (NORVASC) 10 MG tablet Take 10 mg by mouth daily.    [provider]  aspirin EC 81 MG tablet Take 81 mg by mouth daily.    [provider]  bisacodyl (DULCOLAX) 5 MG EC tablet Take 5 mg by mouth daily.    [provider]  cloNIDine (CATAPRES - DOSED IN MG/24 HR) 0.3 mg/24hr patch Place 0.3 mg onto the skin every Monday.     [provider]  Evolocumab (REPATHA SURECLICK) 401 MG/ML SOAJ Inject into the skin.    [provider]  furosemide (LASIX) 40 MG tablet Take 80 mg by mouth daily.    [provider]  Insulin Glargine (LANTUS SOLOSTAR) 100 UNIT/ML Solostar Pen Inject 20 Units into the skin daily.     [provider]  loratadine (CLARITIN) 10 MG tablet Take 10 mg by mouth daily.    [provider]  metoprolol succinate (TOPROL-XL) 100 MG 24 hr tablet Take 100 mg by mouth daily. Take with or immediately following a meal.    [provider]  ranitidine (ZANTAC) 150 MG tablet Take 150 mg by mouth daily.    [provider]  rosuvastatin (CRESTOR) 5 MG tablet Take 5 mg by mouth daily.    [provider]  senna-docusate (SENNA-PLUS) 8.6-50 MG tablet Take 1 tablet by mouth 2 (two) times daily.    [provider]  sodium bicarbonate 650 MG tablet Take 1,300 mg by mouth 2 (two) times daily.     [provider]    Family History Family History  Problem Relation Age of Onset  . Diabetes Mother   . Hypertension Mother   . Cancer Father   . Colon cancer Neg Hx     Social History Social History    Tobacco Use  . Smoking status: Current Every Day Smoker    Packs/day: 0.75    Years: 30.00    Pack years: 22.50    Types: Cigarettes  . Smokeless tobacco: Never Used  . Tobacco comment:  down from 2 packs/day  Substance Use Topics  . Alcohol use: No    Alcohol/week: 0.0 standard drinks  . Drug use: No     Allergies   Oxycodone-acetaminophen   Review of Systems Review of Systems  Neurological: Positive for headaches.  All other systems reviewed and are negative.    Physical Exam Updated Vital Signs BP (!) 222/94 (BP Location: Right Arm)   Pulse 89   Temp 98.5 F (36.9 C)   Ht 5\' 7"  (1.702 m)   Wt 74.4 kg   SpO2  98%   BMI 25.69 kg/m   Physical Exam Vitals signs and nursing note reviewed.  Constitutional:      General: He is not in acute distress.    Appearance: He is well-developed.  HENT:     Head: Normocephalic and atraumatic.     Comments: Port wine stain over the left side of the face and head    Right Ear: Tympanic membrane normal.     Left Ear: Tympanic membrane normal.     Nose: Congestion present.     Mouth/Throat:     Mouth: Mucous membranes are dry.     Pharynx: No oropharyngeal exudate or posterior oropharyngeal erythema.  Eyes:     Conjunctiva/sclera: Conjunctivae normal.     Pupils: Pupils are equal, round, and reactive to light.  Neck:     Musculoskeletal: Normal range of motion and neck supple. No muscular tenderness.  Cardiovascular:     Rate and Rhythm: Normal rate and regular rhythm.     Pulses: Normal pulses.     Heart sounds: No murmur.  Pulmonary:     Effort: Pulmonary effort is normal. No respiratory distress.     Breath sounds: Examination of the left-lower field reveals rhonchi. Rhonchi present. No wheezing or rales.  Abdominal:     General: There is no distension.     Palpations: Abdomen is soft.     Tenderness: There is no abdominal tenderness. There is no guarding or rebound.  Musculoskeletal: Normal range of motion.         General: No tenderness.     Left lower leg: No edema.     Comments: Right BKA  Lymphadenopathy:     Cervical: No cervical adenopathy.  Skin:    General: Skin is warm and dry.     Findings: No erythema or rash.  Neurological:     General: No focal deficit present.     Mental Status: He is alert and oriented to person, place, and time. Mental status is at baseline.     Motor: No weakness.  Psychiatric:        Mood and Affect: Mood normal.        Behavior: Behavior normal.      ED Treatments / Results  Labs (all labs ordered are listed, but only abnormal results are displayed) Labs Reviewed  CBC WITH DIFFERENTIAL/PLATELET - Abnormal; Notable for the following components:      Result Value   Hemoglobin 11.1 (*)    HCT 36.5 (*)    MCV 78.3 (*)    MCH 23.8 (*)    All other components within normal limits  COMPREHENSIVE METABOLIC PANEL - Abnormal; Notable for the following components:   CO2 16 (*)    Glucose, Bld 207 (*)    BUN 69 (*)    Creatinine, Ser 9.41 (*)    Calcium 8.1 (*)    Total Protein 5.6 (*)    Albumin 2.7 (*)    AST 14 (*)    GFR calc non Af Amer 5 (*)    GFR calc Af Amer 6 (*)    All other components within normal limits  INFLUENZA PANEL BY PCR (TYPE A & B)  LACTIC ACID, PLASMA    EKG EKG Interpretation  Date/Time:  Wednesday June 12 2018 11:00:47 EST Ventricular Rate:  85 PR Interval:    QRS Duration: 91 QT Interval:  401 QTC Calculation: 477 R Axis:   67 Text Interpretation:  Sinus rhythm Left ventricular hypertrophy ST  elevation, consider anterior injury Borderline prolonged QT interval No significant change since last tracing Confirmed by Blanchie Dessert [phone]) on 06/12/2018 1:10:14 PM   Radiology Dg Chest 2 View  Result Date: 06/12/2018 CLINICAL DATA:  Cough and chills.  Headache.  Hypertension. EXAM: CHEST - 2 VIEW COMPARISON:  06/10/2018 FINDINGS: The heart size and mediastinal contours are within normal limits. Both lungs are  clear except for slight peribronchial thickening on the lateral view. The visualized skeletal structures are unremarkable. IMPRESSION: Slight bronchitic changes.  Otherwise, normal exam. Electronically Signed   By: Lorriane Shire M.D.   On: 06/12/2018 13:05   Dg Chest 2 View  Result Date: 06/10/2018 CLINICAL DATA:  Cough and shortness of breath for 4 days. EXAM: CHEST - 2 VIEW COMPARISON:  None. FINDINGS: Cardiomediastinal silhouette is normal. Mediastinal contours appear intact. There is no evidence of focal airspace consolidation, pleural effusion or pneumothorax. Peribronchial thickening with central predominance, usually associated with acute bronchitis. Osseous structures are without acute abnormality. Soft tissues are grossly normal. IMPRESSION: Peribronchial thickening with central predominance usually associated with acute bronchitis. No definite lobar consolidation. Electronically Signed   By: Fidela Salisbury M.D.   On: 06/10/2018 15:35   Ct Head Wo Contrast  Result Date: 06/12/2018 CLINICAL DATA:  Worst headache of life EXAM: CT HEAD WITHOUT CONTRAST TECHNIQUE: Contiguous axial images were obtained from the base of the skull through the vertex without intravenous contrast. COMPARISON:  12/01/2004, MR brain, 12/27/2012 FINDINGS: Brain: No evidence of acute infarction, hemorrhage, hydrocephalus, extra-axial collection or mass lesion/mass effect. Periventricular white matter hypodensity. Vascular: No hyperdense vessel or unexpected calcification. Skull: Normal. Negative for fracture or focal lesion. Sinuses/Orbits: No acute finding. Other: None. IMPRESSION: No acute intracranial pathology. Small-vessel white matter disease. No non-contrast CT findings to explain headache. Electronically Signed   By: Eddie Candle M.D.   On: 06/12/2018 12:52    Procedures Procedures (including critical care time)  Medications Ordered in ED Medications  metoCLOPramide (REGLAN) injection 10 mg (has no  administration in time range)  acetaminophen (TYLENOL) tablet 1,000 mg (has no administration in time range)  sodium chloride 0.9 % bolus 500 mL (has no administration in time range)     Initial Impression / Assessment and Plan / ED Course  I have reviewed the triage vital signs and the nursing notes.  Pertinent labs & imaging results that were available during my care of the patient were reviewed by me and considered in my medical decision making (see chart for details).     Patient with multiple medical problems presenting today with severe headache and URI symptoms for the last 3 days.  Patient states he was started on antibiotic and cough medicine for URIs type symptoms that started earlier in the week.  He states he did have a chest x-ray that was okay as far as he knows.  However at 4 AM he woke up with a headache which he states is in the front of his head and he does not usually get headaches.  He has no neuro deficits on exam and denies any visual changes.  He is also had a lot of sinus symptoms which might be the cause of his headache.  He denies any trauma and low suspicion for subdural hemorrhage.  Lower suspicion for subarachnoid hemorrhage but patient woke up with the pain so it is hard to know if it was thunderclap.  We will do a CT to further evaluate.  Also will do a rectal temperature, labs  and chest x-ray to ensure no evidence of pneumonia.  He was also swabbed for the flu.  1:15 PM On reevaluation patient states his headache is a little bit better but still present.  CBC without acute findings, CMP with creatinine of 9.4 and BUN of 69 with a GFR of 5.  However this may be chronic for him will contact [facility] to ensure what his most recent kidney function has been.  Patient was also found to be hypertensive here in the 200s over 100 range.  On further discussion with the patient on his medications he took off the Catapres patch on Monday and he has not replaced it.  Some of  this may be rebound hypertension due to lack of medication.  He has taken his amlodipine, Lasix and hydralzine today.  Will place a patch and also talk to pace.  Pt's CT without acute findings and CXR with bronchitic changes.  After discussing with patient's PCP Dr. Bradd Burner patient's renal function is at baseline.  He states that he was just worried that patient may have a bleed and wanted him to have a head CT.  That is negative.  Patient's Catapres patch was placed but he can otherwise be discharged home to continue his home meds and the doxycycline.  He has follow-up with nephrology on Friday.  He does not have significant hyperkalemia today and BUN has been slowly rising but do not feel that is the cause of his headache today.  Patient is eating and drinking here and is well-appearing at this time.  Final Clinical Impressions(s) / ED Diagnoses   Final diagnoses:  Other secondary hypertension  Bad headache    ED Discharge Orders    None       Blanchie Dessert, MD 06/15/18 [phone]

## 2018-06-18 DIAGNOSIS — N186 End stage renal disease: Secondary | ICD-10-CM | POA: Insufficient documentation

## 2018-06-18 DIAGNOSIS — E785 Hyperlipidemia, unspecified: Secondary | ICD-10-CM | POA: Insufficient documentation

## 2018-06-18 DIAGNOSIS — D631 Anemia in chronic kidney disease: Secondary | ICD-10-CM | POA: Insufficient documentation

## 2018-06-18 DIAGNOSIS — E8770 Fluid overload, unspecified: Secondary | ICD-10-CM | POA: Insufficient documentation

## 2018-06-18 DIAGNOSIS — E1129 Type 2 diabetes mellitus with other diabetic kidney complication: Secondary | ICD-10-CM | POA: Insufficient documentation

## 2018-06-18 DIAGNOSIS — Z992 Dependence on renal dialysis: Secondary | ICD-10-CM | POA: Insufficient documentation

## 2018-06-18 DIAGNOSIS — N2581 Secondary hyperparathyroidism of renal origin: Secondary | ICD-10-CM | POA: Insufficient documentation

## 2018-06-18 DIAGNOSIS — D689 Coagulation defect, unspecified: Secondary | ICD-10-CM | POA: Insufficient documentation

## 2018-06-18 DIAGNOSIS — I129 Hypertensive chronic kidney disease with stage 1 through stage 4 chronic kidney disease, or unspecified chronic kidney disease: Secondary | ICD-10-CM | POA: Insufficient documentation

## 2018-06-18 DIAGNOSIS — N2589 Other disorders resulting from impaired renal tubular function: Secondary | ICD-10-CM | POA: Insufficient documentation

## 2018-06-18 DIAGNOSIS — E1140 Type 2 diabetes mellitus with diabetic neuropathy, unspecified: Secondary | ICD-10-CM | POA: Insufficient documentation

## 2018-06-18 DIAGNOSIS — Z91199 Patient's noncompliance with other medical treatment and regimen due to unspecified reason: Secondary | ICD-10-CM | POA: Insufficient documentation

## 2018-06-18 DIAGNOSIS — Z89511 Acquired absence of right leg below knee: Secondary | ICD-10-CM | POA: Insufficient documentation

## 2018-06-18 DIAGNOSIS — D509 Iron deficiency anemia, unspecified: Secondary | ICD-10-CM | POA: Insufficient documentation

## 2018-06-18 DIAGNOSIS — I770 Arteriovenous fistula, acquired: Secondary | ICD-10-CM | POA: Insufficient documentation

## 2018-06-24 DIAGNOSIS — R52 Pain, unspecified: Secondary | ICD-10-CM | POA: Insufficient documentation

## 2018-06-24 DIAGNOSIS — R0602 Shortness of breath: Secondary | ICD-10-CM | POA: Insufficient documentation

## 2018-06-24 DIAGNOSIS — L299 Pruritus, unspecified: Secondary | ICD-10-CM | POA: Insufficient documentation

## 2018-06-27 ENCOUNTER — Encounter: Payer: Self-pay | Admitting: Nephrology

## 2018-07-10 DIAGNOSIS — E441 Mild protein-calorie malnutrition: Secondary | ICD-10-CM | POA: Insufficient documentation

## 2018-09-11 DIAGNOSIS — Z4802 Encounter for removal of sutures: Secondary | ICD-10-CM | POA: Insufficient documentation

## 2018-09-25 DIAGNOSIS — E039 Hypothyroidism, unspecified: Secondary | ICD-10-CM | POA: Insufficient documentation

## 2018-10-30 DIAGNOSIS — E876 Hypokalemia: Secondary | ICD-10-CM | POA: Insufficient documentation

## 2018-12-30 ENCOUNTER — Other Ambulatory Visit: Payer: Self-pay | Admitting: Nurse Practitioner

## 2018-12-30 ENCOUNTER — Ambulatory Visit
Admission: RE | Admit: 2018-12-30 | Discharge: 2018-12-30 | Disposition: A | Discharge status: Home / Self Care | Payer: No Typology Code available for payment source | Source: Ambulatory Visit | Attending: Nurse Practitioner | Admitting: Nurse Practitioner

## 2018-12-30 DIAGNOSIS — R200 Anesthesia of skin: Secondary | ICD-10-CM

## 2018-12-30 DIAGNOSIS — R202 Paresthesia of skin: Secondary | ICD-10-CM

## 2019-01-29 ENCOUNTER — Other Ambulatory Visit: Payer: Self-pay | Admitting: Family Medicine

## 2019-01-29 DIAGNOSIS — G3184 Mild cognitive impairment, so stated: Secondary | ICD-10-CM

## 2019-02-04 ENCOUNTER — Other Ambulatory Visit: Payer: Self-pay

## 2019-02-04 ENCOUNTER — Ambulatory Visit
Admission: RE | Admit: 2019-02-04 | Discharge: 2019-02-04 | Disposition: A | Discharge status: Home / Self Care | Payer: Medicare (Managed Care) | Source: Ambulatory Visit | Attending: Family Medicine | Admitting: Family Medicine

## 2019-02-04 DIAGNOSIS — G3184 Mild cognitive impairment, so stated: Secondary | ICD-10-CM

## 2019-07-18 ENCOUNTER — Ambulatory Visit: Payer: Medicare (Managed Care) | Admitting: Podiatry

## 2019-07-29 ENCOUNTER — Ambulatory Visit: Payer: Medicare (Managed Care) | Admitting: Podiatry

## 2019-08-05 ENCOUNTER — Encounter: Payer: Self-pay | Admitting: Podiatry

## 2019-08-05 ENCOUNTER — Ambulatory Visit (INDEPENDENT_AMBULATORY_CARE_PROVIDER_SITE_OTHER): Payer: Medicare (Managed Care) | Admitting: Podiatry

## 2019-08-05 ENCOUNTER — Ambulatory Visit (INDEPENDENT_AMBULATORY_CARE_PROVIDER_SITE_OTHER): Payer: Medicare (Managed Care)

## 2019-08-05 ENCOUNTER — Other Ambulatory Visit: Payer: Self-pay

## 2019-08-05 VITALS — BP 146/78 | HR 76 | Temp 96.6°F | Resp 14

## 2019-08-05 DIAGNOSIS — M869 Osteomyelitis, unspecified: Secondary | ICD-10-CM | POA: Diagnosis not present

## 2019-08-05 DIAGNOSIS — M7989 Other specified soft tissue disorders: Secondary | ICD-10-CM | POA: Diagnosis not present

## 2019-08-05 DIAGNOSIS — L97521 Non-pressure chronic ulcer of other part of left foot limited to breakdown of skin: Secondary | ICD-10-CM | POA: Diagnosis not present

## 2019-08-05 MED ORDER — DOXYCYCLINE HYCLATE 100 MG PO TABS: 100.0000 mg | ORAL_TABLET | Freq: Two times a day (BID) | ORAL | 0 refills | Status: DC

## 2019-08-17 NOTE — Progress Notes (Signed)
Subjective:   Patient ID: Gary [name], male   DOB: 64 y.o.   MRN: [id number]   HPI 64 year old male presents the office today for concerns of a wound at the tip of his left second toe.  He has a history of a left hallux amputation as well as a right below-knee amputation.  He states he was trimming his own nails and he got too close.  He has not had any drainage or odor but he had some swelling.  He is not sure of his last A1c.   Review of Systems  All other systems reviewed and are negative.  Past Medical History:  Diagnosis Date  . Blurred vision   . Chronic kidney disease    sees [facility]  . Constipation   . Constipation   . Diabetes mellitus    type 2  . GERD (gastroesophageal reflux disease)   . High cholesterol   . Hypertension   . Insomnia   . Peripheral artery disease ([place])   . Port-wine stain of face    Left V1 distribution, including upper eyelid  . Sensorineural hearing loss    left ear  . Vitamin D deficiency     Past Surgical History:  Procedure Laterality Date  . AMPUTATION Right 07/17/2011  . AMPUTATION Left 10/03/2012   Procedure: AMPUTATION DIGIT;  Surgeon: Serafina Mitchell, MD;  Location: [facility] OR;  Service: Vascular;  Laterality: Left;  Amputation of left Great  toe  . AV FISTULA PLACEMENT Left 08/30/2017   Procedure: CREATION OF LEFT BRACHIOCEPHALIC ARTERIOVENOUS FISTULA;  Surgeon: Serafina Mitchell, MD;  Location: [facility];  Service: Vascular;  Laterality: Left;  . COLONOSCOPY    . PERIPHERAL ARTERIAL STENT GRAFT N/A 2010  . STUMP REVISION Right 01/28/2016   Procedure: Revision Right Below Knee Amputation;  Surgeon: Newt Minion, MD;  Location: [facility];  Service: Orthopedics;  Laterality: Right;     Current Outpatient Medications:  .  amLODipine (NORVASC) 10 MG tablet, Take 10 mg by mouth daily., Disp: , Rfl:  .  aspirin EC 81 MG tablet, Take 81 mg by mouth daily., Disp: , Rfl:  .  bisacodyl (DULCOLAX) 5 MG EC tablet, Take 5 mg by mouth daily., Disp: ,  Rfl:  .  cloNIDine (CATAPRES - DOSED IN MG/24 HR) 0.3 mg/24hr patch, Place 0.3 mg onto the skin every Monday. , Disp: , Rfl:  .  doxycycline (VIBRA-TABS) 100 MG tablet, Take 1 tablet (100 mg total) by mouth 2 (two) times daily., Disp: 20 tablet, Rfl: 0 .  Evolocumab (REPATHA SURECLICK) 370 MG/ML SOAJ, Inject into the skin., Disp: , Rfl:  .  furosemide (LASIX) 40 MG tablet, Take 80 mg by mouth daily., Disp: , Rfl:  .  Insulin Glargine (LANTUS SOLOSTAR) 100 UNIT/ML Solostar Pen, Inject 20 Units into the skin daily. , Disp: , Rfl:  .  loratadine (CLARITIN) 10 MG tablet, Take 10 mg by mouth daily., Disp: , Rfl:  .  metoprolol succinate (TOPROL-XL) 100 MG 24 hr tablet, Take 100 mg by mouth daily. Take with or immediately following a meal., Disp: , Rfl:  .  ranitidine (ZANTAC) 150 MG tablet, Take 150 mg by mouth daily., Disp: , Rfl:  .  rosuvastatin (CRESTOR) 5 MG tablet, Take 5 mg by mouth daily., Disp: , Rfl:  .  senna-docusate (SENNA-PLUS) 8.6-50 MG tablet, Take 1 tablet by mouth 2 (two) times daily., Disp: , Rfl:  .  sodium bicarbonate 650 MG tablet, Take 1,300  mg by mouth 2 (two) times daily. , Disp: , Rfl:   Allergies  Allergen Reactions  . Oxycodone-Acetaminophen Rash and Other (See Comments)    Macular papular rash after two days on Oxy/APAP resolved with d/c of drug.         Objective:  Physical Exam  General: AAO x3, NAD  Dermatological: At the distal aspect of the left second toe hyperkeratotic lesion with superficial wound.  There is edema to the toe and faint erythema there is no ascending cellulitis appears to be localized to the distal aspect.  There is no fluctuance or crepitation.  There is no malodor.  Vascular: Pulses decreased left side there is no pain with calf compression, swelling, warmth, erythema.   Neruologic: Sensation decreased with Semmes Weinstein monofilament.  Musculoskeletal: Significant hammertoe contracture present left second toe.  Muscular strength 5/5  in all groups tested bilateral.     Assessment:   Ulceration left second toe, PAD     Plan:  -Treatment options discussed including all alternatives, risks, and complications -Etiology of symptoms were discussed -X-rays obtained reviewed.  There is no definitive evidence of acute fracture or definitive osteomyelitis but there is no soft tissue edema.  On the lateral view there was a questionable area I was originally concerned by for osteomyelitis likely this is chronic changes.  We will continue to monitor. -Debrided the hyperkeratotic tissue in the wound today without any complications.  Prescribed doxycycline. -Arterial studies. -Offloading at all times.  Monitor for any signs or symptoms of worsening infection reported emergency department should any occur.  RTC 2 weeks or sooner if needed  Trula Slade DPM

## 2019-08-18 ENCOUNTER — Telehealth: Payer: Self-pay | Admitting: *Deleted

## 2019-08-18 DIAGNOSIS — M7989 Other specified soft tissue disorders: Secondary | ICD-10-CM

## 2019-08-18 DIAGNOSIS — M869 Osteomyelitis, unspecified: Secondary | ICD-10-CM

## 2019-08-18 DIAGNOSIS — L97521 Non-pressure chronic ulcer of other part of left foot limited to breakdown of skin: Secondary | ICD-10-CM

## 2019-08-18 DIAGNOSIS — I739 Peripheral vascular disease, unspecified: Secondary | ICD-10-CM

## 2019-08-18 NOTE — Telephone Encounter (Signed)
-----   Message from Trula Slade, DPM sent at 08/17/2019  9:00 AM EDT ----- Can you please order arterial studies? Thanks!

## 2019-08-18 NOTE — Telephone Encounter (Signed)
[facility] states schedule appts first then she will pre-cert once she is contacted. Faxed to [facility] required form with note instructing to contact [facility] [phone] after scheduling for pre-cert information, clinical and demographics.

## 2019-08-18 NOTE — Telephone Encounter (Signed)
Left message for [facility] - Pre-cert Coordinator requesting information to pre-cert for arterial dopplers and ABI with TBI.

## 2019-08-19 ENCOUNTER — Encounter: Payer: Self-pay | Admitting: Podiatry

## 2019-08-19 ENCOUNTER — Ambulatory Visit (INDEPENDENT_AMBULATORY_CARE_PROVIDER_SITE_OTHER): Payer: Medicare (Managed Care) | Admitting: Podiatry

## 2019-08-19 ENCOUNTER — Other Ambulatory Visit: Payer: Self-pay

## 2019-08-19 DIAGNOSIS — I739 Peripheral vascular disease, unspecified: Secondary | ICD-10-CM | POA: Diagnosis not present

## 2019-08-19 DIAGNOSIS — L97521 Non-pressure chronic ulcer of other part of left foot limited to breakdown of skin: Secondary | ICD-10-CM | POA: Diagnosis not present

## 2019-08-20 NOTE — Progress Notes (Signed)
Subjective: 64 year old male presents the office for follow-up evaluation of a wound to the tip of his left second toe.  He states the wound is healed.  Mild chronic swelling to the toe reports but no redness or warmth.  No pain.  He finished antibiotics. Denies any systemic complaints such as fevers, chills, nausea, vomiting. No acute changes since last appointment, and no other complaints at this time.   Objective: AAO x3, NAD DP/PT pulses palpable bilaterally, CRT less than 3 seconds Hammertoe contractures present resulting hyperkeratotic lesion the distal aspect of the left second toe.  Upon debridement appears the wound is healed.  Mild edema to the toe but there is no erythema or warmth.  There is no drainage or pus or any warmth.  No fluctuation or crepitation.  There is no malodor. No open lesions or pre-ulcerative lesions.  No pain with calf compression, swelling, warmth, erythema  Assessment: Preulcerative callus left second toe with second toe swelling  Plan: -All treatment options discussed with the patient including all alternatives, risks, complications.  -Debrided the hyperkeratotic lesion without any complications or bleeding.  Continue offloading pads.  Awaiting arterial studies. -Monitor for any clinical signs or symptoms of infection and directed to call the office immediately should any occur or go to the ER. -Patient encouraged to call the office with any questions, concerns, change in symptoms.   Trula Slade DPM

## 2019-09-03 ENCOUNTER — Other Ambulatory Visit: Payer: Self-pay

## 2019-09-03 ENCOUNTER — Ambulatory Visit (INDEPENDENT_AMBULATORY_CARE_PROVIDER_SITE_OTHER)
Admission: RE | Admit: 2019-09-03 | Discharge: 2019-09-03 | Disposition: A | Discharge status: Home / Self Care | Payer: Medicare (Managed Care) | Source: Ambulatory Visit | Attending: Podiatry | Admitting: Podiatry

## 2019-09-03 ENCOUNTER — Ambulatory Visit (HOSPITAL_COMMUNITY)
Admission: RE | Admit: 2019-09-03 | Discharge: 2019-09-03 | Disposition: A | Discharge status: Home / Self Care | Payer: Medicare (Managed Care) | Source: Ambulatory Visit | Attending: Podiatry | Admitting: Podiatry

## 2019-09-03 DIAGNOSIS — I739 Peripheral vascular disease, unspecified: Secondary | ICD-10-CM | POA: Diagnosis not present

## 2019-09-03 DIAGNOSIS — L97521 Non-pressure chronic ulcer of other part of left foot limited to breakdown of skin: Secondary | ICD-10-CM

## 2019-09-03 DIAGNOSIS — M869 Osteomyelitis, unspecified: Secondary | ICD-10-CM

## 2019-09-03 DIAGNOSIS — M7989 Other specified soft tissue disorders: Secondary | ICD-10-CM

## 2019-09-04 ENCOUNTER — Ambulatory Visit (INDEPENDENT_AMBULATORY_CARE_PROVIDER_SITE_OTHER): Payer: Medicare (Managed Care) | Admitting: Podiatry

## 2019-09-04 ENCOUNTER — Encounter: Payer: Self-pay | Admitting: Podiatry

## 2019-09-04 DIAGNOSIS — I739 Peripheral vascular disease, unspecified: Secondary | ICD-10-CM | POA: Diagnosis not present

## 2019-09-04 DIAGNOSIS — L97521 Non-pressure chronic ulcer of other part of left foot limited to breakdown of skin: Secondary | ICD-10-CM | POA: Diagnosis not present

## 2019-09-04 NOTE — Progress Notes (Signed)
Subjective: 64 year old male presents the office for follow-up evaluation of a wound to the tip of his left second toe.  States that overall he is doing well.  Denies any increase in swelling or any drainage or pus.  He has no new concerns today.  No open sores he has noticed.  He had circulation test performed yesterday. Denies any systemic complaints such as fevers, chills, nausea, vomiting. No acute changes since last appointment, and no other complaints at this time.    Arterial Duplex 09/03/2019 Summary:  Left: 50-74% stenosis noted in the mid superficial femoral artery.  Stenosis noted in the proximal to mid superficial femoral artery 30-49%.  Total occlusion noted in the distal popliteal artery.   Objective: AAO x3, NAD Previous hallux amputation present.  There is hammertoe contracture present resulting in a hyperkeratotic lesion at the distal aspect of the toe.  Upon debridement there is no ongoing ulceration drainage or any signs of infection.  No open lesions identified.  Minimal swelling to the 2nd toe without any erythema or warmth. No other open lesions or pre-ulcerative lesions identified.  No pain with calf compression, swelling, warmth, erythema  Assessment: Preulcerative callus left second toe with second toe swelling  Plan: -All treatment options discussed with the patient including all alternatives, risks, complications.  -Sharply debrided the preulcerative callus with any complications or bleeding.  Continue offloading pad and monitor for any skin breakdown.  Will refer to vascular surgery.  Trula Slade DPM

## 2019-09-05 ENCOUNTER — Telehealth: Payer: Self-pay | Admitting: *Deleted

## 2019-09-05 DIAGNOSIS — I739 Peripheral vascular disease, unspecified: Secondary | ICD-10-CM

## 2019-09-05 DIAGNOSIS — M7989 Other specified soft tissue disorders: Secondary | ICD-10-CM

## 2019-09-05 DIAGNOSIS — L97521 Non-pressure chronic ulcer of other part of left foot limited to breakdown of skin: Secondary | ICD-10-CM

## 2019-09-05 DIAGNOSIS — M869 Osteomyelitis, unspecified: Secondary | ICD-10-CM

## 2019-09-05 NOTE — Telephone Encounter (Signed)
-----   Message from Trula Slade, DPM sent at 09/03/2019  2:19 PM EDT ----- Val- can you schedule an appointment to see Gary [name] for evaluation? Please let the patient know the circulation was decreased

## 2019-09-05 NOTE — Telephone Encounter (Signed)
Left message for pt to call for results and instructions.

## 2019-09-09 NOTE — Telephone Encounter (Signed)
I informed pt of Dr. Leigh Aurora review of results and request to send to [facility]. Pt states I have to inform [facility].

## 2019-09-09 NOTE — Telephone Encounter (Signed)
Left message informing [facility] of the necessity of a referral to [facility].

## 2019-10-07 ENCOUNTER — Ambulatory Visit (INDEPENDENT_AMBULATORY_CARE_PROVIDER_SITE_OTHER): Payer: Medicare (Managed Care) | Admitting: Podiatry

## 2019-10-07 ENCOUNTER — Other Ambulatory Visit: Payer: Self-pay

## 2019-10-07 ENCOUNTER — Encounter: Payer: Self-pay | Admitting: Podiatry

## 2019-10-07 DIAGNOSIS — I739 Peripheral vascular disease, unspecified: Secondary | ICD-10-CM | POA: Diagnosis not present

## 2019-10-07 DIAGNOSIS — L84 Corns and callosities: Secondary | ICD-10-CM | POA: Diagnosis not present

## 2019-10-07 DIAGNOSIS — M7989 Other specified soft tissue disorders: Secondary | ICD-10-CM

## 2019-10-12 NOTE — Progress Notes (Signed)
Subjective: 64 year old male presents the office for follow-up evaluation of a wound to the tip of his left second toe.  He states that it is doing well he has not seen any drainage or any swelling or redness.  The wound is healed he believes.  He still has an appointment to see vascular surgery scheduled however. Denies any systemic complaints such as fevers, chills, nausea, vomiting. No acute changes since last appointment, and no other complaints at this time.    Arterial Duplex 09/03/2019 Summary:  Left: 50-74% stenosis noted in the mid superficial femoral artery.  Stenosis noted in the proximal to mid superficial femoral artery 30-49%.  Total occlusion noted in the distal popliteal artery.   Objective: AAO x3, NAD Previous hallux amputation present.  There is hammertoe contracture present resulting in a hyperkeratotic lesion at the distal aspect of the toe.  After debridement of the hyperkeratotic lesion there is no underlying ulceration, drainage or any signs of infection noted today.  Swelling to the toe is minimal. No other open lesions or pre-ulcerative lesions identified.  No pain with calf compression, swelling, warmth, erythema  Assessment: Preulcerative callus left second toe with second toe swelling-improvement  Plan: -All treatment options discussed with the patient including all alternatives, risks, complications.  -Sharply debrided the preulcerative callus with any complications or bleeding.  Continue offloading pad and monitor for any skin breakdown.  Although the wound is healed outside plan to follow-up with vascular surgery.   Trula Slade DPM

## 2019-11-19 ENCOUNTER — Other Ambulatory Visit: Payer: Self-pay

## 2019-11-19 DIAGNOSIS — I739 Peripheral vascular disease, unspecified: Secondary | ICD-10-CM

## 2019-12-01 ENCOUNTER — Other Ambulatory Visit: Payer: Self-pay

## 2019-12-01 ENCOUNTER — Ambulatory Visit (HOSPITAL_COMMUNITY)
Admission: RE | Admit: 2019-12-01 | Discharge: 2019-12-01 | Disposition: A | Discharge status: Home / Self Care | Payer: Medicare (Managed Care) | Source: Ambulatory Visit | Attending: Surgery | Admitting: Surgery

## 2019-12-01 ENCOUNTER — Encounter: Payer: Self-pay | Admitting: Surgery

## 2019-12-01 ENCOUNTER — Ambulatory Visit (INDEPENDENT_AMBULATORY_CARE_PROVIDER_SITE_OTHER): Payer: Medicare (Managed Care) | Admitting: Surgery

## 2019-12-01 VITALS — BP 134/66 | HR 72 | Temp 98.2°F | Resp 20 | Ht 67.0 in | Wt 135.0 lb

## 2019-12-01 DIAGNOSIS — I7025 Atherosclerosis of native arteries of other extremities with ulceration: Secondary | ICD-10-CM | POA: Diagnosis not present

## 2019-12-01 DIAGNOSIS — I739 Peripheral vascular disease, unspecified: Secondary | ICD-10-CM | POA: Diagnosis present

## 2019-12-01 NOTE — Progress Notes (Signed)
[facility] [place]  Patient name: Gary [name] MRN: [id number] DOB: February 16, 1956 Sex: male   REASON FOR VISIT:    Follow up  HISOTRY OF PRESENT ILLNESS:    Gary [name] is a 64 y.o. 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  He is status post left great toe amputation on October 03, 2012.  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  He now has a wound at the tip of his left second toe, which is being managed by Dr. Jacqualyn Posey.  Vascular lab studies showed popliteal occlusion.  He is here for further discussions.  The patient suffers from diabetes.  He is medically managed for hypertension.  He continues to smoke.  He is on a statin for hypercholesterolemia   PAST MEDICAL HISTORY:   Past Medical History:  Diagnosis Date  . Blurred vision   . Chronic kidney disease    sees [facility]  . Constipation   . Constipation   . Diabetes mellitus    type 2  . GERD (gastroesophageal reflux disease)   . High cholesterol   . Hypertension   . Insomnia   . Peripheral artery disease ([place])   . Port-wine stain of face    Left V1 distribution, including upper eyelid  . Sensorineural hearing loss    left ear  . Vitamin D deficiency      FAMILY HISTORY:   Family History  Problem Relation Age of Onset  . Diabetes Mother   . Hypertension Mother   . Cancer Father   . Colon cancer Neg Hx     SOCIAL HISTORY:   Social History   Tobacco Use  . Smoking status: Current Every Day Smoker    Packs/day: 0.50    Years: 30.00    Pack years: 15.00    Types: Cigarettes  . Smokeless tobacco: Never Used  . Tobacco comment:  down from 2 packs/day  Substance Use Topics  . Alcohol use: No    Alcohol/week: 0.0 standard drinks     ALLERGIES:   Allergies  Allergen Reactions  . Oxycodone-Acetaminophen Rash and Other (See Comments)    Macular papular rash after two days on  Oxy/APAP resolved with d/c of drug.     CURRENT MEDICATIONS:   Current Outpatient Medications  Medication Sig Dispense Refill  . amLODipine (NORVASC) 10 MG tablet Take 10 mg by mouth daily.    Marland Kitchen aspirin EC 81 MG tablet Take 81 mg by mouth daily.    . bisacodyl (DULCOLAX) 5 MG EC tablet Take 5 mg by mouth daily.    . cloNIDine (CATAPRES - DOSED IN MG/24 HR) 0.3 mg/24hr patch Place 0.3 mg onto the skin every Monday.     . Evolocumab (REPATHA SURECLICK) 324 MG/ML SOAJ Inject into the skin.    . furosemide (LASIX) 40 MG tablet Take 80 mg by mouth daily.    . Insulin Glargine (LANTUS SOLOSTAR) 100 UNIT/ML Solostar Pen Inject 20 Units into the skin daily.     Marland Kitchen loratadine (CLARITIN) 10 MG tablet Take 10 mg by mouth daily.    . metoprolol succinate (TOPROL-XL) 100 MG 24 hr tablet Take 100 mg by mouth daily. Take with or immediately following a meal.    . ranitidine (ZANTAC) 150 MG tablet Take 150 mg by mouth daily.    . rosuvastatin (CRESTOR) 5 MG tablet Take 5 mg by mouth daily.    Marland Kitchen senna-docusate (SENNA-PLUS)  8.6-50 MG tablet Take 1 tablet by mouth 2 (two) times daily.    . sodium bicarbonate 650 MG tablet Take 1,300 mg by mouth 2 (two) times daily.      No current facility-administered medications for this visit.    REVIEW OF SYSTEMS:   [X]  denotes positive finding, [ ]  denotes negative finding Cardiac  Comments:  Chest pain or chest pressure:    Shortness of breath upon exertion:    Short of breath when lying flat:    Irregular heart rhythm:        Vascular    Pain in calf, thigh, or hip brought on by ambulation:    Pain in feet at night that wakes you up from your sleep:     Blood clot in your veins:    Leg swelling:         Pulmonary    Oxygen at home:    Productive cough:     Wheezing:         Neurologic    Sudden weakness in arms or legs:     Sudden numbness in arms or legs:     Sudden onset of difficulty speaking or slurred speech:    Temporary loss of vision in  one eye:     Problems with dizziness:         Gastrointestinal    Blood in stool:     Vomited blood:         Genitourinary    Burning when urinating:     Blood in urine:        Psychiatric    Major depression:         Hematologic    Bleeding problems:    Problems with blood clotting too easily:        Skin    Rashes or ulcers: x       Constitutional    Fever or chills:      PHYSICAL EXAM:   Vitals:   12/01/19 1206  BP: 134/66  Pulse: 72  Resp: 20  Temp: 98.2 F (36.8 C)  SpO2: 98%  Weight: 135 lb (61.2 kg)  Height: 5\' 7"  (1.702 m)    GENERAL: The patient is a well-nourished male, in no acute distress. The vital signs are documented above. CARDIAC: There is a regular rate and rhythm.  VASCULAR: Nonpalpable pedal pulses PULMONARY: Non-labored respirations ABDOMEN: Soft and non-tender with normal pitched bowel sounds.  MUSCULOSKELETAL: There are no major deformities or cyanosis. NEUROLOGIC: No focal weakness or paresthesias are detected. SKIN: Healed ulcer at the tip of the left second toe with overlying callus PSYCHIATRIC: The patient has a normal affect.  STUDIES:   I have reviewed the following Right Carotid: Velocities in the right ICA are consistent with a 1-39%  stenosis.   Left Carotid: Velocities in the left ICA are consistent with a 1-39%  stenosis.  Right: BKA.  Left: Resting left ankle-brachial index cannot be calculated due to  noncompressible lower extremity arteries. The posterior tibial and  dorsalis pedis artery waveforms are biphasic. The great toe has previously  been amputated. Abnormal digital waveform  was obtained on the second digit.   Left: 50-74% stenosis noted in the mid superficial femoral artery.  Stenosis noted in the proximal to mid superficial femoral artery 30-49%.  Total occlusion noted in the distal popliteal artery.  MEDICAL ISSUES:   Healed left second toe ulcer: The left second toe appears to be slightly edematous  however according  to the patient this is his baseline.  Ultrasound shows popliteal occlusion on the left.  Given that the patient was able to heal this wound, I would not recommend any intervention.  I will have him come back in 4 to 6 weeks for a wound check to make sure there are no further issues.  If he does develop repeat ulceration, angiography would be indicated.  Hopefully he will not require intervention and that this will remain healed.    Leia Alf, MD, FACS [facility] [phone] Pager [phone]

## 2019-12-09 ENCOUNTER — Other Ambulatory Visit: Payer: Self-pay

## 2019-12-09 ENCOUNTER — Ambulatory Visit (INDEPENDENT_AMBULATORY_CARE_PROVIDER_SITE_OTHER): Payer: Medicare (Managed Care) | Admitting: Podiatry

## 2019-12-09 DIAGNOSIS — M79674 Pain in right toe(s): Secondary | ICD-10-CM | POA: Diagnosis not present

## 2019-12-09 DIAGNOSIS — B351 Tinea unguium: Secondary | ICD-10-CM | POA: Diagnosis not present

## 2019-12-09 DIAGNOSIS — M79675 Pain in left toe(s): Secondary | ICD-10-CM

## 2019-12-09 DIAGNOSIS — L84 Corns and callosities: Secondary | ICD-10-CM

## 2019-12-09 DIAGNOSIS — I739 Peripheral vascular disease, unspecified: Secondary | ICD-10-CM

## 2019-12-09 NOTE — Progress Notes (Signed)
Subjective: 64 year old male presents the office for evaluation of preulcerative callus of the left second toe as well as for nail fungus on his nails 2 through 5 on the left foot.  Has a history of right below-knee amputation left hallux amputation.  No open sores.  His last blood sugar he checked yesterday was in the 180s.  Unsure of his last A1c. Denies any systemic complaints such as fevers, chills, nausea, vomiting. No acute changes since last appointment, and no other complaints at this time.    Arterial Duplex 09/03/2019 Summary:  Left: 50-74% stenosis noted in the mid superficial femoral artery.  Stenosis noted in the proximal to mid superficial femoral artery 30-49%.  Total occlusion noted in the distal popliteal artery.   Objective: AAO x3, NAD Previous hallux amputation present.  There is hammertoe contracture present resulting in a hyperkeratotic lesion at the distal aspect of the toe.  There is no underlying ulceration, drainage or any signs of infection noted today.   Nails are hypertrophic, dystrophic, brittle, discolored, elongated 4. No surrounding redness or drainage. Tenderness nails 2-5 on the left. No open lesions or pre-ulcerative lesions are identified today. No other open lesions or pre-ulcerative lesions identified.  No pain with calf compression, swelling, warmth, erythema  Assessment: Preulcerative callus left second toe; symptomatic onychomycosis  Plan: -All treatment options discussed with the patient including all alternatives, risks, complications.  -Sharply debrided the preulcerative callus with any complications or bleeding.  Continue offloading pad and monitor for any skin breakdown. -Nails x4 without complications or bleeding  Trula Slade DPM

## 2020-01-01 ENCOUNTER — Other Ambulatory Visit: Payer: Self-pay

## 2020-01-01 DIAGNOSIS — N186 End stage renal disease: Secondary | ICD-10-CM

## 2020-01-12 ENCOUNTER — Ambulatory Visit (INDEPENDENT_AMBULATORY_CARE_PROVIDER_SITE_OTHER): Payer: Medicare (Managed Care) | Admitting: Surgery

## 2020-01-12 ENCOUNTER — Other Ambulatory Visit: Payer: Self-pay

## 2020-01-12 ENCOUNTER — Ambulatory Visit (HOSPITAL_COMMUNITY)
Admission: RE | Admit: 2020-01-12 | Discharge: 2020-01-12 | Disposition: A | Discharge status: Home / Self Care | Payer: Medicare (Managed Care) | Source: Ambulatory Visit | Attending: Surgery | Admitting: Surgery

## 2020-01-12 ENCOUNTER — Encounter: Payer: Self-pay | Admitting: Surgery

## 2020-01-12 VITALS — BP 153/80 | HR 68 | Temp 98.5°F | Resp 20 | Ht 67.0 in | Wt 135.0 lb

## 2020-01-12 DIAGNOSIS — N186 End stage renal disease: Secondary | ICD-10-CM

## 2020-01-12 DIAGNOSIS — Z992 Dependence on renal dialysis: Secondary | ICD-10-CM | POA: Diagnosis not present

## 2020-01-12 DIAGNOSIS — I7025 Atherosclerosis of native arteries of other extremities with ulceration: Secondary | ICD-10-CM

## 2020-01-12 NOTE — H&P (View-Only) (Signed)
[facility] [place]  Patient name: Gary [name] MRN: [id number] DOB: [birth year] Sex: male   REASON FOR VISIT:    Follow up  HISOTRY OF PRESENT ILLNESS:    Gary [name] is a 64 y.o. 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  He is status post left great toe amputation on October 03, 2012.  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  He now has a wound at the tip of his left second toe, which is being managed by Dr. Jacqualyn Posey. Vascular lab studies showed popliteal occlusion.   I last saw him 6 weeks ago, and the wound had healed and so no intervention was performed.  Today, he is without any open wounds.  He does any pain in his left leg.  He is also complaining of pain in his fistula when it is cannulated  The patient suffers from diabetes.  He is medically managed for hypertension.  He continues to smoke.  He is on a statin for hypercholesterolemia   PAST MEDICAL HISTORY:   Past Medical History:  Diagnosis Date   Blurred vision    Chronic kidney disease    sees [facility]   Constipation    Constipation    Diabetes mellitus    type 2   GERD (gastroesophageal reflux disease)    High cholesterol    Hypertension    Insomnia    Peripheral artery disease (HCC)    Port-wine stain of face    Left V1 distribution, including upper eyelid   Sensorineural hearing loss    left ear   Vitamin D deficiency      FAMILY HISTORY:   Family History  Problem Relation Age of Onset   Diabetes Mother    Hypertension Mother    Cancer Father    Colon cancer Neg Hx     SOCIAL HISTORY:   Social History   Tobacco Use   Smoking status: Current Every Day Smoker    Packs/day: 0.50    Years: 30.00    Pack years: 15.00    Types: Cigarettes   Smokeless tobacco: Never Used   Tobacco comment:  down from 2 packs/day  Substance Use Topics   Alcohol  use: No    Alcohol/week: 0.0 standard drinks     ALLERGIES:   Allergies  Allergen Reactions   Oxycodone-Acetaminophen Rash and Other (See Comments)    Macular papular rash after two days on Oxy/APAP resolved with d/c of drug.     CURRENT MEDICATIONS:   Current Outpatient Medications  Medication Sig Dispense Refill   amLODipine (NORVASC) 10 MG tablet Take 10 mg by mouth daily.     aspirin EC 81 MG tablet Take 81 mg by mouth daily.     bisacodyl (DULCOLAX) 5 MG EC tablet Take 5 mg by mouth daily.     cloNIDine (CATAPRES - DOSED IN MG/24 HR) 0.3 mg/24hr patch Place 0.3 mg onto the skin every Monday.      Evolocumab (REPATHA SURECLICK) 740 MG/ML SOAJ Inject into the skin.     furosemide (LASIX) 40 MG tablet Take 80 mg by mouth daily.     Insulin Glargine (LANTUS SOLOSTAR) 100 UNIT/ML Solostar Pen Inject 20 Units into the skin daily.      loratadine (CLARITIN) 10 MG tablet Take 10 mg by mouth daily.     metoprolol succinate (TOPROL-XL) 100 MG 24 hr tablet Take 100 mg by mouth daily.  Take with or immediately following a meal.     ranitidine (ZANTAC) 150 MG tablet Take 150 mg by mouth daily.     rosuvastatin (CRESTOR) 5 MG tablet Take 5 mg by mouth daily.     senna-docusate (SENNA-PLUS) 8.6-50 MG tablet Take 1 tablet by mouth 2 (two) times daily.     sodium bicarbonate 650 MG tablet Take 1,300 mg by mouth 2 (two) times daily.      No current facility-administered medications for this visit.    REVIEW OF SYSTEMS:   [X]  denotes positive finding, [ ]  denotes negative finding Cardiac  Comments:  Chest pain or chest pressure:    Shortness of breath upon exertion:    Short of breath when lying flat:    Irregular heart rhythm:        Vascular    Pain in calf, thigh, or hip brought on by ambulation:    Pain in feet at night that wakes you up from your sleep:     Blood clot in your veins:    Leg swelling:         Pulmonary    Oxygen at home:    Productive cough:       Wheezing:         Neurologic    Sudden weakness in arms or legs:     Sudden numbness in arms or legs:     Sudden onset of difficulty speaking or slurred speech:    Temporary loss of vision in one eye:     Problems with dizziness:         Gastrointestinal    Blood in stool:     Vomited blood:         Genitourinary    Burning when urinating:     Blood in urine:        Psychiatric    Major depression:         Hematologic    Bleeding problems:    Problems with blood clotting too easily:        Skin    Rashes or ulcers:        Constitutional    Fever or chills:      PHYSICAL EXAM:   Vitals:   01/12/20 1423  BP: (!) 153/80  Pulse: 68  Resp: 20  Temp: 98.5 F (36.9 C)  SpO2: 98%  Weight: 135 lb (61.2 kg)  Height: 5\' 7"  (1.702 m)    GENERAL: The patient is a well-nourished male, in no acute distress. The vital signs are documented above. CARDIAC: There is a regular rate and rhythm.  VASCULAR: Nonpalpable pedal pulse on the left PULMONARY: Non-labored respirations ABDOMEN: Soft and non-tender with normal pitched bowel sounds.  MUSCULOSKELETAL: Right leg amputation NEUROLOGIC: No focal weakness or paresthesias are detected. SKIN: There are no ulcers or rashes noted. PSYCHIATRIC: The patient has a normal affect.  STUDIES:   Reviewed the following: +------------+----------+-------------+----------+----------------+   OUTFLOW VEIN PSV (cm/s) Diameter (cm) Depth (cm)   Describe     +------------+----------+-------------+----------+----------------+   Shoulder     323     0.45      0.57              +------------+----------+-------------+----------+----------------+   Prox UA      223     0.40      0.86              +------------+----------+-------------+----------+----------------+   Mid UA      220  0.33      0.55              +------------+----------+-------------+----------+----------------+    Dist UA      336     0.57      0.16   competing branch   +------------+----------+-------------+----------+----------------+   AC Fossa     323                            +------------+----------+-------------+----------+----------------+   MEDICAL ISSUES:   PAD: The patient has no open wounds.  He has a known popliteal occlusion on the left.  He will contact me should he develop a nonhealing ulcer.  Otherwise he will follow-up in 1 year with ABIs  End-stage renal disease: The patient has a left brachiocephalic fistula which is difficult to cannulate.  Duplex today shows small caliber vein.  I think a fistulogram is warranted.  I will get this done in the near future.    Leia Alf, MD, FACS [facility] [phone] Pager [phone]

## 2020-01-12 NOTE — Progress Notes (Signed)
[facility]ist of [place]  Patient name: Gary [name] MRN: [id number] DOB: 11/24/1955 Sex: male   REASON FOR VISIT:    Follow up  HISOTRY OF PRESENT ILLNESS:    Gary [name] is a 64 y.o. 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  He is status post left great toe amputation on October 03, 2012.  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  He now has a wound at the tip of his left second toe, which is being managed by Dr. Jacqualyn Posey. Vascular lab studies showed popliteal occlusion.   I last saw him 6 weeks ago, and the wound had healed and so no intervention was performed.  Today, he is without any open wounds.  He does any pain in his left leg.  He is also complaining of pain in his fistula when it is cannulated  The patient suffers from diabetes.  He is medically managed for hypertension.  He continues to smoke.  He is on a statin for hypercholesterolemia   PAST MEDICAL HISTORY:   Past Medical History:  Diagnosis Date   Blurred vision    Chronic kidney disease    sees [facility]   Constipation    Constipation    Diabetes mellitus    type 2   GERD (gastroesophageal reflux disease)    High cholesterol    Hypertension    Insomnia    Peripheral artery disease (HCC)    Port-wine stain of face    Left V1 distribution, including upper eyelid   Sensorineural hearing loss    left ear   Vitamin D deficiency      FAMILY HISTORY:   Family History  Problem Relation Age of Onset   Diabetes Mother    Hypertension Mother    Cancer Father    Colon cancer Neg Hx     SOCIAL HISTORY:   Social History   Tobacco Use   Smoking status: Current Every Day Smoker    Packs/day: 0.50    Years: 30.00    Pack years: 15.00    Types: Cigarettes   Smokeless tobacco: Never Used   Tobacco comment:  down from 2 packs/day  Substance Use Topics   Alcohol  use: No    Alcohol/week: 0.0 standard drinks     ALLERGIES:   Allergies  Allergen Reactions   Oxycodone-Acetaminophen Rash and Other (See Comments)    Macular papular rash after two days on Oxy/APAP resolved with d/c of drug.     CURRENT MEDICATIONS:   Current Outpatient Medications  Medication Sig Dispense Refill   amLODipine (NORVASC) 10 MG tablet Take 10 mg by mouth daily.     aspirin EC 81 MG tablet Take 81 mg by mouth daily.     bisacodyl (DULCOLAX) 5 MG EC tablet Take 5 mg by mouth daily.     cloNIDine (CATAPRES - DOSED IN MG/24 HR) 0.3 mg/24hr patch Place 0.3 mg onto the skin every Monday.      Evolocumab (REPATHA SURECLICK) 433 MG/ML SOAJ Inject into the skin.     furosemide (LASIX) 40 MG tablet Take 80 mg by mouth daily.     Insulin Glargine (LANTUS SOLOSTAR) 100 UNIT/ML Solostar Pen Inject 20 Units into the skin daily.      loratadine (CLARITIN) 10 MG tablet Take 10 mg by mouth daily.     metoprolol succinate (TOPROL-XL) 100 MG 24 hr tablet Take 100 mg by mouth daily.  Take with or immediately following a meal.     ranitidine (ZANTAC) 150 MG tablet Take 150 mg by mouth daily.     rosuvastatin (CRESTOR) 5 MG tablet Take 5 mg by mouth daily.     senna-docusate (SENNA-PLUS) 8.6-50 MG tablet Take 1 tablet by mouth 2 (two) times daily.     sodium bicarbonate 650 MG tablet Take 1,300 mg by mouth 2 (two) times daily.      No current facility-administered medications for this visit.    REVIEW OF SYSTEMS:   [X]  denotes positive finding, [ ]  denotes negative finding Cardiac  Comments:  Chest pain or chest pressure:    Shortness of breath upon exertion:    Short of breath when lying flat:    Irregular heart rhythm:        Vascular    Pain in calf, thigh, or hip brought on by ambulation:    Pain in feet at night that wakes you up from your sleep:     Blood clot in your veins:    Leg swelling:         Pulmonary    Oxygen at home:    Productive cough:       Wheezing:         Neurologic    Sudden weakness in arms or legs:     Sudden numbness in arms or legs:     Sudden onset of difficulty speaking or slurred speech:    Temporary loss of vision in one eye:     Problems with dizziness:         Gastrointestinal    Blood in stool:     Vomited blood:         Genitourinary    Burning when urinating:     Blood in urine:        Psychiatric    Major depression:         Hematologic    Bleeding problems:    Problems with blood clotting too easily:        Skin    Rashes or ulcers:        Constitutional    Fever or chills:      PHYSICAL EXAM:   Vitals:   01/12/20 1423  BP: (!) 153/80  Pulse: 68  Resp: 20  Temp: 98.5 F (36.9 C)  SpO2: 98%  Weight: 135 lb (61.2 kg)  Height: 5\' 7"  (1.702 m)    GENERAL: The patient is a well-nourished male, in no acute distress. The vital signs are documented above. CARDIAC: There is a regular rate and rhythm.  VASCULAR: Nonpalpable pedal pulse on the left PULMONARY: Non-labored respirations ABDOMEN: Soft and non-tender with normal pitched bowel sounds.  MUSCULOSKELETAL: Right leg amputation NEUROLOGIC: No focal weakness or paresthesias are detected. SKIN: There are no ulcers or rashes noted. PSYCHIATRIC: The patient has a normal affect.  STUDIES:   Reviewed the following: +------------+----------+-------------+----------+----------------+   OUTFLOW VEIN PSV (cm/s) Diameter (cm) Depth (cm)   Describe     +------------+----------+-------------+----------+----------------+   Shoulder     323     0.45      0.57              +------------+----------+-------------+----------+----------------+   Prox UA      223     0.40      0.86              +------------+----------+-------------+----------+----------------+   Mid UA      220  0.33      0.55              +------------+----------+-------------+----------+----------------+    Dist UA      336     0.57      0.16   competing branch   +------------+----------+-------------+----------+----------------+   AC Fossa     323                            +------------+----------+-------------+----------+----------------+   MEDICAL ISSUES:   PAD: The patient has no open wounds.  He has a known popliteal occlusion on the left.  He will contact me should he develop a nonhealing ulcer.  Otherwise he will follow-up in 1 year with ABIs  End-stage renal disease: The patient has a left brachiocephalic fistula which is difficult to cannulate.  Duplex today shows small caliber vein.  I think a fistulogram is warranted.  I will get this done in the near future.    Leia Alf, MD, FACS [facility] [phone] Pager [phone]

## 2020-01-14 ENCOUNTER — Other Ambulatory Visit: Payer: Self-pay

## 2020-01-14 MED ORDER — SODIUM CHLORIDE 0.9 % IV SOLN: 250.0000 mL | INTRAVENOUS | Status: DC | PRN

## 2020-01-19 ENCOUNTER — Other Ambulatory Visit (HOSPITAL_COMMUNITY)
Admission: RE | Admit: 2020-01-19 | Discharge: 2020-01-19 | Disposition: A | Discharge status: Home / Self Care | Payer: Medicare (Managed Care) | Source: Ambulatory Visit | Attending: Surgery | Admitting: Surgery

## 2020-01-19 DIAGNOSIS — Z20822 Contact with and (suspected) exposure to covid-19: Secondary | ICD-10-CM | POA: Diagnosis not present

## 2020-01-19 DIAGNOSIS — Z01812 Encounter for preprocedural laboratory examination: Secondary | ICD-10-CM | POA: Diagnosis present

## 2020-01-19 LAB — SARS CORONAVIRUS 2 (TAT 6-24 HRS): SARS Coronavirus 2: NEGATIVE

## 2020-01-20 ENCOUNTER — Encounter (HOSPITAL_COMMUNITY): Payer: Self-pay | Admitting: Surgery

## 2020-01-20 ENCOUNTER — Ambulatory Visit (HOSPITAL_COMMUNITY)
Admission: RE | Disposition: A | Discharge status: Home / Self Care | Payer: Self-pay | Source: Home / Self Care | Attending: Surgery

## 2020-01-20 ENCOUNTER — Ambulatory Visit (HOSPITAL_COMMUNITY)
Admission: RE | Admit: 2020-01-20 | Discharge: 2020-01-20 | Disposition: A | Discharge status: Home / Self Care | Payer: Medicare (Managed Care) | Attending: Surgery | Admitting: Surgery

## 2020-01-20 DIAGNOSIS — E559 Vitamin D deficiency, unspecified: Secondary | ICD-10-CM | POA: Diagnosis not present

## 2020-01-20 DIAGNOSIS — Z79899 Other long term (current) drug therapy: Secondary | ICD-10-CM | POA: Diagnosis not present

## 2020-01-20 DIAGNOSIS — E1122 Type 2 diabetes mellitus with diabetic chronic kidney disease: Secondary | ICD-10-CM | POA: Diagnosis not present

## 2020-01-20 DIAGNOSIS — E7800 Pure hypercholesterolemia, unspecified: Secondary | ICD-10-CM | POA: Insufficient documentation

## 2020-01-20 DIAGNOSIS — T82898A Other specified complication of vascular prosthetic devices, implants and grafts, initial encounter: Secondary | ICD-10-CM | POA: Diagnosis not present

## 2020-01-20 DIAGNOSIS — K219 Gastro-esophageal reflux disease without esophagitis: Secondary | ICD-10-CM | POA: Diagnosis not present

## 2020-01-20 DIAGNOSIS — Z794 Long term (current) use of insulin: Secondary | ICD-10-CM | POA: Diagnosis not present

## 2020-01-20 DIAGNOSIS — E1151 Type 2 diabetes mellitus with diabetic peripheral angiopathy without gangrene: Secondary | ICD-10-CM | POA: Insufficient documentation

## 2020-01-20 DIAGNOSIS — N186 End stage renal disease: Secondary | ICD-10-CM | POA: Insufficient documentation

## 2020-01-20 DIAGNOSIS — F17210 Nicotine dependence, cigarettes, uncomplicated: Secondary | ICD-10-CM | POA: Diagnosis not present

## 2020-01-20 DIAGNOSIS — Z7982 Long term (current) use of aspirin: Secondary | ICD-10-CM | POA: Insufficient documentation

## 2020-01-20 DIAGNOSIS — I120 Hypertensive chronic kidney disease with stage 5 chronic kidney disease or end stage renal disease: Secondary | ICD-10-CM | POA: Diagnosis not present

## 2020-01-20 DIAGNOSIS — Z992 Dependence on renal dialysis: Secondary | ICD-10-CM | POA: Diagnosis not present

## 2020-01-20 HISTORY — PX: A/V FISTULAGRAM: CATH118298

## 2020-01-20 LAB — POCT I-STAT, CHEM 8
BUN: 39 mg/dL — ABNORMAL HIGH (ref 8–23)
Calcium, Ion: 1.16 mmol/L (ref 1.15–1.40)
Chloride: 97 mmol/L — ABNORMAL LOW (ref 98–111)
Creatinine, Ser: 8.4 mg/dL — ABNORMAL HIGH (ref 0.61–1.24)
Glucose, Bld: 145 mg/dL — ABNORMAL HIGH (ref 70–99)
HCT: 39 % (ref 39.0–52.0)
Hemoglobin: 13.3 g/dL (ref 13.0–17.0)
Potassium: 3.7 mmol/L (ref 3.5–5.1)
Sodium: 144 mmol/L (ref 135–145)
TCO2: 29 mmol/L (ref 22–32)

## 2020-01-20 SURGERY — A/V FISTULAGRAM
Anesthesia: LOCAL | Laterality: Left

## 2020-01-20 MED ORDER — SODIUM CHLORIDE 0.9% FLUSH: 3.0000 mL | INTRAVENOUS | Status: DC | PRN

## 2020-01-20 MED ORDER — LIDOCAINE HCL (PF) 1 % IJ SOLN
INTRAMUSCULAR | Status: DC | PRN
  Administered 2020-01-20: 5 mL

## 2020-01-20 MED ORDER — FENTANYL CITRATE (PF) 100 MCG/2ML IJ SOLN
INTRAMUSCULAR | Status: DC | PRN
Start: 2020-01-20 — End: 2020-01-20
  Administered 2020-01-20: 25 ug via INTRAVENOUS

## 2020-01-20 MED ORDER — IODIXANOL 320 MG/ML IV SOLN
INTRAVENOUS | Status: DC | PRN
  Administered 2020-01-20: 30 mL

## 2020-01-20 MED ORDER — HEPARIN (PORCINE) IN NACL 1000-0.9 UT/500ML-% IV SOLN
INTRAVENOUS | Status: AC
  Filled 2020-01-20: qty 500

## 2020-01-20 MED ORDER — FENTANYL CITRATE (PF) 100 MCG/2ML IJ SOLN
INTRAMUSCULAR | Status: AC
  Filled 2020-01-20: qty 2

## 2020-01-20 MED ORDER — HEPARIN (PORCINE) IN NACL 1000-0.9 UT/500ML-% IV SOLN
INTRAVENOUS | Status: DC | PRN
  Administered 2020-01-20: 500 mL

## 2020-01-20 MED ORDER — LIDOCAINE HCL (PF) 1 % IJ SOLN
INTRAMUSCULAR | Status: AC
  Filled 2020-01-20: qty 30

## 2020-01-20 MED ORDER — SODIUM CHLORIDE 0.9% FLUSH: 3.0000 mL | Freq: Two times a day (BID) | INTRAVENOUS | Status: DC

## 2020-01-20 SURGICAL SUPPLY — 8 items
COVER DOME SNAP 22 D (MISCELLANEOUS) ×2 IMPLANT
KIT MICROPUNCTURE NIT STIFF (SHEATH) ×2 IMPLANT
PROTECTION STATION PRESSURIZED (MISCELLANEOUS) ×2
SHEATH PROBE COVER 6X72 (BAG) ×2 IMPLANT
STATION PROTECTION PRESSURIZED (MISCELLANEOUS) ×1 IMPLANT
STOPCOCK MORSE 400PSI 3WAY (MISCELLANEOUS) ×2 IMPLANT
TRAY PV CATH (CUSTOM PROCEDURE TRAY) ×2 IMPLANT
TUBING CIL FLEX 10 FLL-RA (TUBING) ×2 IMPLANT

## 2020-01-20 NOTE — Op Note (Signed)
    Patient name: Gary [name] MRN: [id number] DOB: June 20, 1955 Sex: male  01/20/2020 Pre-operative Diagnosis: Poorly functioning left brachiocephalic fistula Post-operative diagnosis:  Same Surgeon:  Annamarie Major Procedure Performed:  1.  Ultrasound-guided access, left cephalic vein  2.  Fistulogram     Indications: The patient is having difficulty with cannulation of his fistula.  He is here today for further evaluation and possible intervention  Procedure:  The patient was identified in the holding area and taken to room 8.  The patient was then placed supine on the table and prepped and draped in the usual sterile fashion.  A time out was called.  The cephalic vein was evaluated with ultrasound.  It was patent and compressible.  A digital ultrasound image was acquired.  The fistula was then accessed under ultrasound guidance using a micropuncture needle.  An 018 wire was then asvanced without resistance and a micropuncture sheath was placed.  Contrast injections were then performed through the sheath.  Findings: The central venous system is widely patent without stenosis.  The cephalic vein fistula is widely patent without luminal narrowing in the upper arm.  The arteriovenous anastomosis is patent without significant stenosis.  There are 2 small competing branches near the anastomosis.   Intervention: None  Impression:  #1 left brachiocephalic fistula appears to be patent without evidence of arterial venous anastomotic stenosis, central venous stenosis.  #2 if the patient continues to have difficulty with cannulation of the fistula, he should consider new access     V. Annamarie Major, M.D., [facility] [place] Office: [phone] Pager:  [phone]

## 2020-01-20 NOTE — Discharge Instructions (Signed)

## 2020-01-20 NOTE — Interval H&P Note (Signed)
History and Physical Interval Note:  01/20/2020 2:25 PM  Gary [name]  has presented today for surgery, with the diagnosis of end stage renal.  The various methods of treatment have been discussed with the patient and family. After consideration of risks, benefits and other options for treatment, the patient has consented to  Procedure(s): A/V FISTULAGRAM (Left) as a surgical intervention.  The patient's history has been reviewed, patient examined, no change in status, stable for surgery.  I have reviewed the patient's chart and labs.  Questions were answered to the patient's satisfaction.     Annamarie Major

## 2020-01-22 MED FILL — Lidocaine HCl Local Inj 1%: INTRAMUSCULAR | Qty: 20 | Status: AC

## 2020-02-17 ENCOUNTER — Other Ambulatory Visit: Payer: Self-pay

## 2020-02-17 ENCOUNTER — Ambulatory Visit (INDEPENDENT_AMBULATORY_CARE_PROVIDER_SITE_OTHER): Payer: Medicare (Managed Care) | Admitting: Podiatry

## 2020-02-17 DIAGNOSIS — B351 Tinea unguium: Secondary | ICD-10-CM

## 2020-02-17 DIAGNOSIS — M79674 Pain in right toe(s): Secondary | ICD-10-CM | POA: Diagnosis not present

## 2020-02-17 DIAGNOSIS — E1151 Type 2 diabetes mellitus with diabetic peripheral angiopathy without gangrene: Secondary | ICD-10-CM | POA: Diagnosis not present

## 2020-02-17 DIAGNOSIS — L84 Corns and callosities: Secondary | ICD-10-CM

## 2020-02-17 DIAGNOSIS — I739 Peripheral vascular disease, unspecified: Secondary | ICD-10-CM | POA: Diagnosis not present

## 2020-02-17 DIAGNOSIS — E1149 Type 2 diabetes mellitus with other diabetic neurological complication: Secondary | ICD-10-CM

## 2020-02-17 DIAGNOSIS — M79675 Pain in left toe(s): Secondary | ICD-10-CM

## 2020-02-17 NOTE — Progress Notes (Signed)
Subjective: 64 year old male presents the office for thick, elongated toenails on the left foot and for pre-ulcerative calluses. Denies any skin breakdown/ulceration. No increase in swelling/redness. Denies any systemic complaints such as fevers, chills, nausea, vomiting. No acute changes since last appointment, and no other complaints at this time.   Last A1c he reports was around 147 Unsure of A1c  Arterial Duplex 09/03/2019 Summary:  Left: 50-74% stenosis noted in the mid superficial femoral artery.  Stenosis noted in the proximal to mid superficial femoral artery 30-49%.  Total occlusion noted in the distal popliteal artery.   Objective: AAO x3, NAD Previous hallux amputation present and right BKA. Hammertoe contracture present resulting in a hyperkeratotic lesion at the distal aspect of the toe.  There is no underlying ulceration, drainage or any signs of infection noted today.   Nails are hypertrophic, dystrophic, brittle, discolored, elongated 4. No surrounding redness or drainage. Tenderness nails 2-5 on the left. No open lesions or pre-ulcerative lesions are identified today. No other open lesions. No pain with calf compression, swelling, warmth, erythema  Assessment: Preulcerative callus left second toe; symptomatic onychomycosis  Plan: -All treatment options discussed with the patient including all alternatives, risks, complications.  -Sharply debrided the preulcerative callus with any complications or bleeding x 1. Continue offloading pad and monitor for any skin breakdown. -Nails x4 without complications or bleeding  Trula Slade DPM

## 2020-04-20 ENCOUNTER — Ambulatory Visit: Payer: Medicare (Managed Care) | Admitting: Podiatry

## 2020-04-27 ENCOUNTER — Ambulatory Visit (INDEPENDENT_AMBULATORY_CARE_PROVIDER_SITE_OTHER): Payer: Medicare (Managed Care) | Admitting: Podiatry

## 2020-04-27 ENCOUNTER — Other Ambulatory Visit: Payer: Self-pay

## 2020-04-27 DIAGNOSIS — B351 Tinea unguium: Secondary | ICD-10-CM | POA: Diagnosis not present

## 2020-04-27 DIAGNOSIS — I739 Peripheral vascular disease, unspecified: Secondary | ICD-10-CM

## 2020-04-27 DIAGNOSIS — M79674 Pain in right toe(s): Secondary | ICD-10-CM | POA: Diagnosis not present

## 2020-04-27 DIAGNOSIS — M79675 Pain in left toe(s): Secondary | ICD-10-CM

## 2020-04-27 DIAGNOSIS — E1149 Type 2 diabetes mellitus with other diabetic neurological complication: Secondary | ICD-10-CM

## 2020-04-27 DIAGNOSIS — L84 Corns and callosities: Secondary | ICD-10-CM | POA: Diagnosis not present

## 2020-04-29 NOTE — Progress Notes (Signed)
Subjective: 64 year old male presents the office for thick, elongated toenails on the left foot and for pre-ulcerative calluses. Denies any skin breakdown/ulceration. Denies any systemic complaints such as fevers, chills, nausea, vomiting. No acute changes since last appointment, and no other complaints at this time.    Arterial Duplex 09/03/2019 Summary:  Left: 50-74% stenosis noted in the mid superficial femoral artery.  Stenosis noted in the proximal to mid superficial femoral artery 30-49%.  Total occlusion noted in the distal popliteal artery.   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 Hammertoe contracture present resulting in a hyperkeratotic lesion at the distal aspect of the toe.  There is no underlying ulceration, drainage or any signs of infection noted today.   Nails are hypertrophic, dystrophic, brittle, discolored, elongated 4. No surrounding redness or drainage. Tenderness nails 2-5 on the left. No open lesions or pre-ulcerative lesions are identified today. No other open lesions. No pain with calf compression, swelling, warmth, erythema  Assessment: Preulcerative callus left second toe; symptomatic onychomycosis  Plan: -All treatment options discussed with the patient including all alternatives, risks, complications.  -Sharply debrided the preulcerative callus with any complications or bleeding x 1. Continue offloading pad and monitor for any skin breakdown. -Nails x4 without complications or bleeding  Trula Slade DPM

## 2020-07-27 ENCOUNTER — Encounter: Payer: Self-pay | Admitting: Podiatry

## 2020-07-27 ENCOUNTER — Ambulatory Visit (INDEPENDENT_AMBULATORY_CARE_PROVIDER_SITE_OTHER): Payer: Medicare (Managed Care) | Admitting: Podiatry

## 2020-07-27 ENCOUNTER — Other Ambulatory Visit: Payer: Self-pay

## 2020-07-27 DIAGNOSIS — L84 Corns and callosities: Secondary | ICD-10-CM | POA: Diagnosis not present

## 2020-07-27 DIAGNOSIS — I739 Peripheral vascular disease, unspecified: Secondary | ICD-10-CM | POA: Diagnosis not present

## 2020-07-27 DIAGNOSIS — M79674 Pain in right toe(s): Secondary | ICD-10-CM | POA: Diagnosis not present

## 2020-07-27 DIAGNOSIS — B351 Tinea unguium: Secondary | ICD-10-CM

## 2020-07-27 DIAGNOSIS — M79675 Pain in left toe(s): Secondary | ICD-10-CM

## 2020-07-27 DIAGNOSIS — E1149 Type 2 diabetes mellitus with other diabetic neurological complication: Secondary | ICD-10-CM | POA: Diagnosis not present

## 2020-07-27 NOTE — Progress Notes (Signed)
Subjective: 65 year old male presents the office for thick, elongated toenails on the left foot and for pre-ulcerative calluses. No ulceration he reports. Denies any systemic complaints such as fevers, chills, nausea, vomiting. No acute changes since last appointment, and no other complaints at this time.    Arterial Duplex 09/03/2019 Summary:  Left: 50-74% stenosis noted in the mid superficial femoral artery.  Stenosis noted in the proximal to mid superficial femoral artery 30-49%.  Total occlusion noted in the distal popliteal artery.   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 Hammertoe contracture present resulting in a hyperkeratotic lesion at the distal aspect of the 2nd toe.  There is no underlying ulceration, drainage or any signs of infection noted today.   Nails are hypertrophic, dystrophic, brittle, discolored, elongated 4. No surrounding redness or drainage. Tenderness nails 2-5 on the left.  No other open lesions. No pain with calf compression, swelling, warmth, erythema  Assessment: Preulcerative callus left second toe; symptomatic onychomycosis  Plan: -All treatment options discussed with the patient including all alternatives, risks, complications.  -Sharply debrided the preulcerative callus with any complications or bleeding x 1. Continue offloading pad and monitor for any skin breakdown. -Nails x4 without complications or bleeding  Trula Slade DPM

## 2020-08-16 ENCOUNTER — Telehealth: Payer: Self-pay | Admitting: Podiatry

## 2020-08-16 NOTE — Telephone Encounter (Signed)
Gary [name] with [facility], she stated that he has developed two wounds to the left of the second, status post amputation. There is some drainage, they will treat with silver and foam dressing. Patient needs to be seen soon versus later.  Please call Gary [name] [phone]

## 2020-08-16 NOTE — Telephone Encounter (Signed)
Please schedule him to come in.

## 2020-08-17 ENCOUNTER — Other Ambulatory Visit: Payer: Self-pay

## 2020-08-17 ENCOUNTER — Emergency Department (HOSPITAL_COMMUNITY): Payer: Medicare (Managed Care)

## 2020-08-17 ENCOUNTER — Ambulatory Visit: Payer: Self-pay

## 2020-08-17 ENCOUNTER — Ambulatory Visit (INDEPENDENT_AMBULATORY_CARE_PROVIDER_SITE_OTHER): Payer: Medicare (Managed Care) | Admitting: Podiatry

## 2020-08-17 ENCOUNTER — Inpatient Hospital Stay (HOSPITAL_COMMUNITY)
Admission: EM | Admit: 2020-08-17 | Discharge: 2020-08-24 | DRG: 640 | Disposition: A | Discharge status: Home Health | Payer: Medicare (Managed Care) | Source: Ambulatory Visit | Attending: Internal Medicine | Admitting: Internal Medicine

## 2020-08-17 ENCOUNTER — Telehealth: Payer: Self-pay | Admitting: Podiatry

## 2020-08-17 ENCOUNTER — Telehealth: Payer: Self-pay

## 2020-08-17 VITALS — BP 110/60 | HR 76 | Temp 97.3°F | Resp 14

## 2020-08-17 DIAGNOSIS — L97521 Non-pressure chronic ulcer of other part of left foot limited to breakdown of skin: Secondary | ICD-10-CM | POA: Diagnosis not present

## 2020-08-17 DIAGNOSIS — J9600 Acute respiratory failure, unspecified whether with hypoxia or hypercapnia: Secondary | ICD-10-CM | POA: Diagnosis present

## 2020-08-17 DIAGNOSIS — L0231 Cutaneous abscess of buttock: Secondary | ICD-10-CM | POA: Diagnosis present

## 2020-08-17 DIAGNOSIS — I132 Hypertensive heart and chronic kidney disease with heart failure and with stage 5 chronic kidney disease, or end stage renal disease: Secondary | ICD-10-CM | POA: Diagnosis present

## 2020-08-17 DIAGNOSIS — J9601 Acute respiratory failure with hypoxia: Principal | ICD-10-CM

## 2020-08-17 DIAGNOSIS — Z7982 Long term (current) use of aspirin: Secondary | ICD-10-CM

## 2020-08-17 DIAGNOSIS — E8770 Fluid overload, unspecified: Secondary | ICD-10-CM | POA: Diagnosis present

## 2020-08-17 DIAGNOSIS — Z9115 Patient's noncompliance with renal dialysis: Secondary | ICD-10-CM | POA: Diagnosis not present

## 2020-08-17 DIAGNOSIS — R6510 Systemic inflammatory response syndrome (SIRS) of non-infectious origin without acute organ dysfunction: Secondary | ICD-10-CM | POA: Diagnosis present

## 2020-08-17 DIAGNOSIS — E8889 Other specified metabolic disorders: Secondary | ICD-10-CM | POA: Diagnosis present

## 2020-08-17 DIAGNOSIS — F17200 Nicotine dependence, unspecified, uncomplicated: Secondary | ICD-10-CM | POA: Diagnosis present

## 2020-08-17 DIAGNOSIS — I2510 Atherosclerotic heart disease of native coronary artery without angina pectoris: Secondary | ICD-10-CM | POA: Diagnosis present

## 2020-08-17 DIAGNOSIS — I214 Non-ST elevation (NSTEMI) myocardial infarction: Secondary | ICD-10-CM | POA: Diagnosis not present

## 2020-08-17 DIAGNOSIS — F17210 Nicotine dependence, cigarettes, uncomplicated: Secondary | ICD-10-CM | POA: Diagnosis present

## 2020-08-17 DIAGNOSIS — I5042 Chronic combined systolic (congestive) and diastolic (congestive) heart failure: Secondary | ICD-10-CM | POA: Diagnosis present

## 2020-08-17 DIAGNOSIS — R590 Localized enlarged lymph nodes: Secondary | ICD-10-CM | POA: Diagnosis present

## 2020-08-17 DIAGNOSIS — N186 End stage renal disease: Secondary | ICD-10-CM | POA: Diagnosis present

## 2020-08-17 DIAGNOSIS — I447 Left bundle-branch block, unspecified: Secondary | ICD-10-CM | POA: Diagnosis present

## 2020-08-17 DIAGNOSIS — R778 Other specified abnormalities of plasma proteins: Secondary | ICD-10-CM | POA: Diagnosis present

## 2020-08-17 DIAGNOSIS — I739 Peripheral vascular disease, unspecified: Secondary | ICD-10-CM

## 2020-08-17 DIAGNOSIS — L89159 Pressure ulcer of sacral region, unspecified stage: Secondary | ICD-10-CM | POA: Diagnosis not present

## 2020-08-17 DIAGNOSIS — L97529 Non-pressure chronic ulcer of other part of left foot with unspecified severity: Secondary | ICD-10-CM | POA: Diagnosis present

## 2020-08-17 DIAGNOSIS — Z79899 Other long term (current) drug therapy: Secondary | ICD-10-CM | POA: Diagnosis not present

## 2020-08-17 DIAGNOSIS — Z794 Long term (current) use of insulin: Secondary | ICD-10-CM

## 2020-08-17 DIAGNOSIS — L89109 Pressure ulcer of unspecified part of back, unspecified stage: Secondary | ICD-10-CM | POA: Diagnosis not present

## 2020-08-17 DIAGNOSIS — Z20822 Contact with and (suspected) exposure to covid-19: Secondary | ICD-10-CM | POA: Diagnosis present

## 2020-08-17 DIAGNOSIS — I10 Essential (primary) hypertension: Secondary | ICD-10-CM | POA: Diagnosis not present

## 2020-08-17 DIAGNOSIS — E11621 Type 2 diabetes mellitus with foot ulcer: Secondary | ICD-10-CM | POA: Diagnosis present

## 2020-08-17 DIAGNOSIS — D649 Anemia, unspecified: Secondary | ICD-10-CM | POA: Diagnosis present

## 2020-08-17 DIAGNOSIS — E440 Moderate protein-calorie malnutrition: Secondary | ICD-10-CM | POA: Diagnosis present

## 2020-08-17 DIAGNOSIS — M869 Osteomyelitis, unspecified: Secondary | ICD-10-CM

## 2020-08-17 DIAGNOSIS — N2581 Secondary hyperparathyroidism of renal origin: Secondary | ICD-10-CM | POA: Diagnosis present

## 2020-08-17 DIAGNOSIS — E119 Type 2 diabetes mellitus without complications: Secondary | ICD-10-CM | POA: Diagnosis not present

## 2020-08-17 DIAGNOSIS — Z720 Tobacco use: Secondary | ICD-10-CM | POA: Diagnosis not present

## 2020-08-17 DIAGNOSIS — E1151 Type 2 diabetes mellitus with diabetic peripheral angiopathy without gangrene: Secondary | ICD-10-CM | POA: Diagnosis present

## 2020-08-17 DIAGNOSIS — J449 Chronic obstructive pulmonary disease, unspecified: Secondary | ICD-10-CM | POA: Diagnosis present

## 2020-08-17 DIAGNOSIS — Z8249 Family history of ischemic heart disease and other diseases of the circulatory system: Secondary | ICD-10-CM

## 2020-08-17 DIAGNOSIS — E785 Hyperlipidemia, unspecified: Secondary | ICD-10-CM | POA: Diagnosis present

## 2020-08-17 DIAGNOSIS — E1122 Type 2 diabetes mellitus with diabetic chronic kidney disease: Secondary | ICD-10-CM | POA: Diagnosis present

## 2020-08-17 DIAGNOSIS — R0902 Hypoxemia: Secondary | ICD-10-CM

## 2020-08-17 DIAGNOSIS — Z833 Family history of diabetes mellitus: Secondary | ICD-10-CM

## 2020-08-17 DIAGNOSIS — Z992 Dependence on renal dialysis: Secondary | ICD-10-CM | POA: Diagnosis not present

## 2020-08-17 DIAGNOSIS — I120 Hypertensive chronic kidney disease with stage 5 chronic kidney disease or end stage renal disease: Secondary | ICD-10-CM

## 2020-08-17 DIAGNOSIS — I21A1 Myocardial infarction type 2: Secondary | ICD-10-CM | POA: Diagnosis present

## 2020-08-17 DIAGNOSIS — Z89511 Acquired absence of right leg below knee: Secondary | ICD-10-CM

## 2020-08-17 LAB — RESP PANEL BY RT-PCR (FLU A&B, COVID) ARPGX2
Influenza A by PCR: NEGATIVE
Influenza B by PCR: NEGATIVE
SARS Coronavirus 2 by RT PCR: NEGATIVE

## 2020-08-17 LAB — CBC WITH DIFFERENTIAL/PLATELET
Abs Immature Granulocytes: 0.04 10*3/uL (ref 0.00–0.07)
Basophils Absolute: 0 10*3/uL (ref 0.0–0.1)
Basophils Relative: 0 %
Eosinophils Absolute: 0.1 10*3/uL (ref 0.0–0.5)
Eosinophils Relative: 1 %
HCT: 42 % (ref 39.0–52.0)
Hemoglobin: 13.6 g/dL (ref 13.0–17.0)
Immature Granulocytes: 0 %
Lymphocytes Relative: 11 %
Lymphs Abs: 1.4 10*3/uL (ref 0.7–4.0)
MCH: 26.9 pg (ref 26.0–34.0)
MCHC: 32.4 g/dL (ref 30.0–36.0)
MCV: 83.2 fL (ref 80.0–100.0)
Monocytes Absolute: 0.8 10*3/uL (ref 0.1–1.0)
Monocytes Relative: 6 %
Neutro Abs: 10.8 10*3/uL — ABNORMAL HIGH (ref 1.7–7.7)
Neutrophils Relative %: 82 %
Platelets: 246 10*3/uL (ref 150–400)
RBC: 5.05 MIL/uL (ref 4.22–5.81)
RDW: 13.8 % (ref 11.5–15.5)
WBC: 13.2 10*3/uL — ABNORMAL HIGH (ref 4.0–10.5)
nRBC: 0 % (ref 0.0–0.2)

## 2020-08-17 LAB — BASIC METABOLIC PANEL
Anion gap: 19 — ABNORMAL HIGH (ref 5–15)
BUN: 59 mg/dL — ABNORMAL HIGH (ref 8–23)
CO2: 26 mmol/L (ref 22–32)
Calcium: 9.1 mg/dL (ref 8.9–10.3)
Chloride: 92 mmol/L — ABNORMAL LOW (ref 98–111)
Creatinine, Ser: 11.89 mg/dL — ABNORMAL HIGH (ref 0.61–1.24)
GFR, Estimated: 4 mL/min — ABNORMAL LOW (ref >60)
Glucose, Bld: 219 mg/dL — ABNORMAL HIGH (ref 70–99)
Potassium: 4.3 mmol/L (ref 3.5–5.1)
Sodium: 137 mmol/L (ref 135–145)

## 2020-08-17 LAB — BRAIN NATRIURETIC PEPTIDE: B Natriuretic Peptide: >4500 pg/mL — ABNORMAL HIGH (ref 0.0–100.0)

## 2020-08-17 LAB — HIV ANTIBODY (ROUTINE TESTING W REFLEX): HIV Screen 4th Generation wRfx: NONREACTIVE

## 2020-08-17 LAB — HEPATITIS B SURFACE ANTIGEN: Hepatitis B Surface Ag: NONREACTIVE

## 2020-08-17 LAB — CREATININE, SERUM
Creatinine, Ser: 12.18 mg/dL — ABNORMAL HIGH (ref 0.61–1.24)
GFR, Estimated: 4 mL/min — ABNORMAL LOW (ref >60)

## 2020-08-17 LAB — HEPATITIS B SURFACE ANTIBODY,QUALITATIVE: Hep B S Ab: NONREACTIVE

## 2020-08-17 LAB — GLUCOSE, CAPILLARY: Glucose-Capillary: 200 mg/dL — ABNORMAL HIGH (ref 70–99)

## 2020-08-17 LAB — TROPONIN I (HIGH SENSITIVITY)
Troponin I (High Sensitivity): 256 ng/L (ref <18)
Troponin I (High Sensitivity): 262 ng/L (ref <18)

## 2020-08-17 LAB — LACTIC ACID, PLASMA: Lactic Acid, Venous: 2.7 mmol/L (ref 0.5–1.9)

## 2020-08-17 MED ORDER — PREDNISONE 20 MG PO TABS: 40.0000 mg | ORAL_TABLET | Freq: Every day | ORAL | Status: DC

## 2020-08-17 MED ORDER — IPRATROPIUM-ALBUTEROL 0.5-2.5 (3) MG/3ML IN SOLN
3.0000 mL | Freq: Four times a day (QID) | RESPIRATORY_TRACT | Status: DC
  Administered 2020-08-17: 3 mL via RESPIRATORY_TRACT
  Filled 2020-08-17: qty 3

## 2020-08-17 MED ORDER — SEVELAMER CARBONATE 800 MG PO TABS
3200.0000 mg | ORAL_TABLET | Freq: Three times a day (TID) | ORAL | Status: DC
  Administered 2020-08-18 – 2020-08-20 (×6): 3200 mg via ORAL
  Filled 2020-08-17 (×12): qty 4

## 2020-08-17 MED ORDER — DOXERCALCIFEROL 4 MCG/2ML IV SOLN
6.0000 ug | INTRAVENOUS | Status: DC
  Filled 2020-08-17: qty 4

## 2020-08-17 MED ORDER — ALBUTEROL SULFATE (2.5 MG/3ML) 0.083% IN NEBU: 2.5000 mg | INHALATION_SOLUTION | RESPIRATORY_TRACT | Status: DC | PRN

## 2020-08-17 MED ORDER — CHLORHEXIDINE GLUCONATE CLOTH 2 % EX PADS
6.0000 | MEDICATED_PAD | Freq: Every day | CUTANEOUS | Status: DC
  Administered 2020-08-18: 6 via TOPICAL

## 2020-08-17 MED ORDER — INSULIN GLARGINE 100 UNIT/ML ~~LOC~~ SOLN
10.0000 [IU] | Freq: Every day | SUBCUTANEOUS | Status: DC
  Filled 2020-08-17: qty 0.1

## 2020-08-17 MED ORDER — ROSUVASTATIN CALCIUM 20 MG PO TABS
20.0000 mg | ORAL_TABLET | Freq: Every day | ORAL | Status: DC
  Administered 2020-08-18 – 2020-08-24 (×7): 20 mg via ORAL
  Filled 2020-08-17 (×7): qty 1

## 2020-08-17 MED ORDER — INSULIN GLARGINE 100 UNIT/ML ~~LOC~~ SOLN
20.0000 [IU] | Freq: Every day | SUBCUTANEOUS | Status: DC
  Administered 2020-08-17 – 2020-08-19 (×2): 20 [IU] via SUBCUTANEOUS
  Filled 2020-08-17 (×4): qty 0.2

## 2020-08-17 MED ORDER — IOHEXOL 350 MG/ML SOLN
50.0000 mL | Freq: Once | INTRAVENOUS | Status: AC | PRN
  Administered 2020-08-17: 50 mL via INTRAVENOUS

## 2020-08-17 MED ORDER — HYDRALAZINE HCL 50 MG PO TABS
100.0000 mg | ORAL_TABLET | Freq: Three times a day (TID) | ORAL | Status: DC
  Administered 2020-08-19 (×4): 100 mg via ORAL
  Filled 2020-08-17 (×5): qty 2

## 2020-08-17 MED ORDER — INSULIN ASPART 100 UNIT/ML ~~LOC~~ SOLN
0.0000 [IU] | Freq: Three times a day (TID) | SUBCUTANEOUS | Status: DC
  Administered 2020-08-18 – 2020-08-19 (×3): 2 [IU] via SUBCUTANEOUS
  Administered 2020-08-20: 7 [IU] via SUBCUTANEOUS
  Administered 2020-08-21 (×2): 2 [IU] via SUBCUTANEOUS
  Administered 2020-08-21: 3 [IU] via SUBCUTANEOUS
  Administered 2020-08-22: 2 [IU] via SUBCUTANEOUS
  Administered 2020-08-22: 3 [IU] via SUBCUTANEOUS
  Administered 2020-08-22 – 2020-08-23 (×2): 2 [IU] via SUBCUTANEOUS
  Administered 2020-08-23: 3 [IU] via SUBCUTANEOUS
  Administered 2020-08-24: 2 [IU] via SUBCUTANEOUS

## 2020-08-17 MED ORDER — AMLODIPINE BESYLATE 10 MG PO TABS
10.0000 mg | ORAL_TABLET | Freq: Every day | ORAL | Status: DC
  Administered 2020-08-19 – 2020-08-20 (×2): 10 mg via ORAL
  Filled 2020-08-17 (×3): qty 1

## 2020-08-17 MED ORDER — ASPIRIN EC 81 MG PO TBEC
81.0000 mg | DELAYED_RELEASE_TABLET | Freq: Every day | ORAL | Status: DC
  Administered 2020-08-19 – 2020-08-24 (×6): 81 mg via ORAL
  Filled 2020-08-17 (×7): qty 1

## 2020-08-17 MED ORDER — NICOTINE 21 MG/24HR TD PT24
21.0000 mg | MEDICATED_PATCH | Freq: Every day | TRANSDERMAL | Status: DC
  Administered 2020-08-18 – 2020-08-19 (×2): 21 mg via TRANSDERMAL
  Filled 2020-08-17 (×5): qty 1

## 2020-08-17 MED ORDER — PANTOPRAZOLE SODIUM 40 MG PO TBEC
40.0000 mg | DELAYED_RELEASE_TABLET | Freq: Every day | ORAL | Status: DC
  Administered 2020-08-18 – 2020-08-24 (×7): 40 mg via ORAL
  Filled 2020-08-17 (×7): qty 1

## 2020-08-17 MED ORDER — BUDESONIDE 0.5 MG/2ML IN SUSP
0.5000 mg | Freq: Two times a day (BID) | RESPIRATORY_TRACT | Status: DC
  Filled 2020-08-17: qty 2

## 2020-08-17 MED ORDER — METOPROLOL SUCCINATE ER 100 MG PO TB24
100.0000 mg | ORAL_TABLET | Freq: Every day | ORAL | Status: DC
  Administered 2020-08-19 – 2020-08-20 (×2): 100 mg via ORAL
  Filled 2020-08-17 (×3): qty 1

## 2020-08-17 MED ORDER — FUROSEMIDE 80 MG PO TABS
80.0000 mg | ORAL_TABLET | Freq: Every day | ORAL | Status: DC
  Administered 2020-08-17 – 2020-08-20 (×3): 80 mg via ORAL
  Filled 2020-08-17 (×4): qty 1

## 2020-08-17 MED ORDER — HEPARIN SODIUM (PORCINE) 5000 UNIT/ML IJ SOLN
5000.0000 [IU] | Freq: Three times a day (TID) | INTRAMUSCULAR | Status: DC
  Administered 2020-08-17 – 2020-08-24 (×21): 5000 [IU] via SUBCUTANEOUS
  Filled 2020-08-17 (×19): qty 1

## 2020-08-17 MED ORDER — BUDESONIDE 0.5 MG/2ML IN SUSP
2.0000 mg | Freq: Four times a day (QID) | RESPIRATORY_TRACT | Status: DC
  Administered 2020-08-17: 2 mg via RESPIRATORY_TRACT
  Filled 2020-08-17 (×4): qty 8

## 2020-08-17 MED ORDER — IPRATROPIUM-ALBUTEROL 0.5-2.5 (3) MG/3ML IN SOLN
3.0000 mL | Freq: Two times a day (BID) | RESPIRATORY_TRACT | Status: DC
  Filled 2020-08-17: qty 3

## 2020-08-17 NOTE — Consult Note (Signed)
[facility] Renal Consultation Note    Indication for Consultation:  Management of ESRD/hemodialysis, anemia, hypertension/volume, and secondary hyperparathyroidism.  HPI: Gary [name] is a 65 y.o. male with PMH including ESRD on dialysis MWF, T2DM, HTN, HLD who presented to the ED today with shortness of breath after missed dialysis. He reports [facility] did not pick him up for dialysis yesterday. He went to his podiatry appointment today and was noted to have O2 sat of 79%. [facility] applied 4L O2 and O2 sat improved to mid 90's. Patient reports he has been short of breath for about 2-3 days with symptoms worsening today. He denies any chest pain, palpitations, dizziness, abdominal pain, nausea, vomiting and diarrhea. No recent cough, fevers or chills.  He denies any recent issues with AVF. He does note that he cannot tolerate very high UF goals and feels sick if they try to pull off too much fluid. ED course notable for CXR without evidence of active disease. K+ 4.3, BUN 59, Cr 11.89, BNP elevated >4500 and troponin 256. WBC count 13.2 with lactic acid 2.7, Hgb 13.6, plt 246. Respiratory viral panel negative for influenza and covid 19.   Past Medical History:  Diagnosis Date  . Blurred vision   . Chronic kidney disease    sees [facility]  . Constipation   . Constipation   . Diabetes mellitus    type 2  . GERD (gastroesophageal reflux disease)   . High cholesterol   . Hypertension   . Insomnia   . Peripheral artery disease ([place])   . [place]wine stain of face    Left V1 distribution, including upper eyelid  . Sensorineural hearing loss    left ear  . Vitamin D deficiency    Past Surgical History:  Procedure Laterality Date  . A/V FISTULAGRAM Left 01/20/2020   Procedure: A/V FISTULAGRAM;  Surgeon: Serafina Mitchell, MD;  Location: [facility];  Service: Cardiovascular;  Laterality: Left;  . AMPUTATION Right 07/17/2011  . AMPUTATION Left 10/03/2012   Procedure: AMPUTATION  DIGIT;  Surgeon: Serafina Mitchell, MD;  Location: [facility] OR;  Service: Vascular;  Laterality: Left;  Amputation of left Great  toe  . AV FISTULA PLACEMENT Left 08/30/2017   Procedure: CREATION OF LEFT BRACHIOCEPHALIC ARTERIOVENOUS FISTULA;  Surgeon: Serafina Mitchell, MD;  Location: [facility];  Service: Vascular;  Laterality: Left;  . COLONOSCOPY    . PERIPHERAL ARTERIAL STENT GRAFT N/A 2010  . STUMP REVISION Right 01/28/2016   Procedure: Revision Right Below Knee Amputation;  Surgeon: Newt Minion, MD;  Location: [place];  Service: Orthopedics;  Laterality: Right;   Family History  Problem Relation Age of Onset  . Diabetes Mother   . Hypertension Mother   . Cancer Father   . Colon cancer Neg Hx    Social History:  reports that he has been smoking cigarettes. He has a 15.00 pack-year smoking history. He has never used smokeless tobacco. He reports that he does not drink alcohol and does not use drugs.  ROS: As per HPI otherwise negative.  Physical Exam: Vitals:   08/17/20 1345 08/17/20 1445 08/17/20 1448 08/17/20 1456  BP: 137/83 (!) 126/59  (!) 129/59  Pulse:    (!) 103  Resp: 20 13  17   Temp:      TempSrc:      SpO2:   99% 96%  Weight:      Height:         General: Well developed,  well nourished, in no acute distress. Head: Normocephalic, atraumatic, sclera non-icteric, mucus membranes are moist. Neck: JVD not elevated. Lungs: Respirations unlabored on O2 via [place]. Scattered rhonchi bilateral lower lobes.  Heart: RRR with normal S1, S2. No murmurs, rubs, or gallops appreciated. Abdomen: Soft, non-distended with normoactive bowel sounds.  Musculoskeletal:  R BKA, no edema bilateral lower extremities Neuro: Alert and oriented X 3. Moves all extremities spontaneously. Psych:  Responds to questions appropriately with a normal affect. Dialysis Access: LUE AVF + bruit  Allergies  Allergen Reactions  . Oxycodone-Acetaminophen Rash and Other (See Comments)    Macular papular rash after two  days on Oxy/APAP resolved with d/c of drug.   Prior to Admission medications   Medication Sig Start Date End Date Taking? Authorizing Provider  amLODipine (NORVASC) 10 MG tablet Take 10 mg by mouth daily.    [provider]  aspirin EC 81 MG tablet Take 81 mg by mouth daily.    [provider]  bisacodyl (DULCOLAX) 5 MG EC tablet Take 5 mg by mouth daily as needed for moderate constipation.     [provider]  Cholecalciferol (PA VITAMIN D-3) 50 MCG (2000 UT) CAPS Take 2,000 Units by mouth daily.    [provider]  cloNIDine (CATAPRES - DOSED IN MG/24 HR) 0.3 mg/24hr patch Place 0.3 mg onto the skin every Monday. Weekly    [provider]  cloNIDine (CATAPRES - DOSED IN MG/24 HR) 0.3 mg/24hr patch Place onto the skin.    [provider]  Evolocumab (REPATHA SURECLICK) 720 MG/ML SOAJ Inject 140 mg into the skin every 30 (thirty) days.     [provider]  furosemide (LASIX) 40 MG tablet Take 80 mg by mouth daily.    [provider]  hydrALAZINE (APRESOLINE) 100 MG tablet Take 100 mg by mouth 3 (three) times daily.    [provider]  Insulin Glargine (LANTUS SOLOSTAR) 100 UNIT/ML Solostar Pen Inject 75 Units into the skin at bedtime.     [provider]  loratadine (CLARITIN) 10 MG tablet Take 10 mg by mouth daily as needed for allergies.     [provider]  Methoxy PEG-Epoetin Beta (MIRCERA IJ) Mircera 01/28/20 01/26/21  [provider]  metoprolol succinate (TOPROL-XL) 100 MG 24 hr tablet Take 100 mg by mouth daily. Take with or immediately following a meal.    [provider]  pantoprazole (PROTONIX) 40 MG tablet Take 40 mg by mouth daily.    [provider]  rosuvastatin (CRESTOR) 20 MG tablet Take 20 mg by mouth daily.     [provider]  senna-docusate (SENNA-PLUS) 8.6-50 MG tablet Take 1 tablet by mouth 2 (two) times daily.    [provider]    Current Facility-Administered Medications  Medication Dose Route Frequency Provider Last Rate Last Admin  . 0.9 %  sodium chloride infusion  250 mL Intravenous PRN Serafina Mitchell, MD      . Derrill Memo ON 08/18/2020] Chlorhexidine Gluconate Cloth 2 % PADS 6 each  6 each Topical Q0600 Janalee Dane, PA-C       Current Outpatient Medications  Medication Sig Dispense Refill  . amLODipine (NORVASC) 10 MG tablet Take 10 mg by mouth daily.    Marland Kitchen aspirin EC 81 MG tablet Take 81 mg by mouth daily.    . bisacodyl (DULCOLAX) 5 MG EC tablet Take 5 mg by mouth daily as needed for moderate constipation.     Marland Kitchen  Cholecalciferol (PA VITAMIN D-3) 50 MCG (2000 UT) CAPS Take 2,000 Units by mouth daily.    . cloNIDine (CATAPRES - DOSED IN MG/24 HR) 0.3 mg/24hr patch Place 0.3 mg onto the skin every Monday. Weekly    . cloNIDine (CATAPRES - DOSED IN MG/24 HR) 0.3 mg/24hr patch Place onto the skin.    . Evolocumab (REPATHA SURECLICK) 888 MG/ML SOAJ Inject 140 mg into the skin every 30 (thirty) days.     . furosemide (LASIX) 40 MG tablet Take 80 mg by mouth daily.    . hydrALAZINE (APRESOLINE) 100 MG tablet Take 100 mg by mouth 3 (three) times daily.    . Insulin Glargine (LANTUS SOLOSTAR) 100 UNIT/ML Solostar Pen Inject 75 Units into the skin at bedtime.     Marland Kitchen loratadine (CLARITIN) 10 MG tablet Take 10 mg by mouth daily as needed for allergies.     . Methoxy PEG-Epoetin Beta (MIRCERA IJ) Mircera    . metoprolol succinate (TOPROL-XL) 100 MG 24 hr tablet Take 100 mg by mouth daily. Take with or immediately following a meal.    . pantoprazole (PROTONIX) 40 MG tablet Take 40 mg by mouth daily.    . rosuvastatin (CRESTOR) 20 MG tablet Take 20 mg by mouth daily.     Marland Kitchen senna-docusate (SENNA-PLUS) 8.6-50 MG tablet Take 1 tablet by mouth 2 (two) times daily.     Labs: Basic Metabolic Panel: Recent Labs  Lab 08/17/20 1341  NA 137  K 4.3  CL 92*  CO2 26  GLUCOSE 219*  BUN 59*  CREATININE 11.89*  CALCIUM 9.1    CBC: Recent Labs  Lab 08/17/20 1341  WBC 13.2*  NEUTROABS 10.8*  HGB 13.6  HCT 42.0  MCV 83.2  PLT 246   Studies/Results: DG Chest Port 1 View  Result Date: 08/17/2020 CLINICAL DATA:  Shortness of breath. EXAM: PORTABLE CHEST 1 VIEW COMPARISON:  June 12, 2018. FINDINGS: Stable cardiomediastinal silhouette. No pneumothorax or pleural effusion is noted. Lungs are clear. Bony thorax is unremarkable. IMPRESSION: No active disease. Aortic Atherosclerosis (ICD10-I70.0). Electronically Signed   By: Marijo Conception M.D.   On: 08/17/2020 14:35    Dialysis Orders: Center: [facility] on  MWF. 180NRe, 3:45 hours, BFR 400, DFR 800, EDW 59kg, 2K/2Ca, AVF 15g, heparin 2200 unit bolus Hectorol 6 mcg IV q HD  Assessment/Plan: 1.  Hypoxia: O2 sat 79% on RA, improved on [place]. CXR not showing active disease but he does have significantly elevated BNP and rhonchi on exam. Most likely volume overload due to missed HD. Will plan for HD tonight off schedule, will reeval tomorrow AM to determine next HD. 2.  ESRD:  [facility]ialyzes on MWF schedule, missed HD yesterday due to transportation issues. Will plan for HD tonight, reeval in AM to determine next HD.  3. Hypertension: BP currently well controlled. Continue home medications.  4.  Anemia: Hemoglobin >12, no  ESA indicated at this time.  5.  Metabolic bone disease: Calcium controlled, continue hectorol. Resume renagel, monitor phosphorus level.  6.  Nutrition:  Renal/carb modified diet, check albumin level with next labs 7. Diabetes mellitus: Management per admitting team  Anice Paganini, PA-C 08/17/2020, 4:32 PM  [facility] Pager: [phone]

## 2020-08-17 NOTE — ED Provider Notes (Signed)
[facility] EMERGENCY DEPARTMENT Provider Note   CSN: [id number] Arrival date & time: 08/17/20  1331     History Chief Complaint  Patient presents with  . Shortness of Breath    HR, Pt arrived via [facility] with c/c of shOB from clinic with low O2 sats. Pt has been low O2 for 2x days. Pt had sats a 80% --> 90% 4L upon . Pt missed dialysis Monday. Pt A&O x4 upon arrival. Denies CP.   127/70, 97HR, BS 204      Gary [name] is a 65 y.o. male.  Pt presents to the ED today with sob.  Pt has a hx of ESRD on HD.  He goes MWF.  His last dialysis was on 4/15.  He did not go yesterday because his ride did not pick him up.  He went to his podiatry appointment today and was found to have an O2 sat of 79% when he walked in.  [facility]ry [facility].  [facility] put pt on 4L and his oxygen level is up to the mid-90s.  Pt denies any cp.  I spoke with the NP who saw pt.  She said the only new med pt is on in [place].          Past Medical History:  Diagnosis Date  . Blurred vision   . Chronic kidney disease    sees [facility]  . Constipation   . Constipation   . Diabetes mellitus    type 2  . GERD (gastroesophageal reflux disease)   . High cholesterol   . Hypertension   . Insomnia   . Peripheral artery disease ([place])   . Port-wine stain of face    Left V1 distribution, including upper eyelid  . Sensorineural hearing loss    left ear  . Vitamin D deficiency     Patient Active Problem List   Diagnosis Date Noted  . Hypokalemia 10/30/2018  . Hypothyroidism, unspecified 09/25/2018  . Encounter for removal of sutures 09/11/2018  . Mild protein-calorie malnutrition ([place]) 07/10/2018  . Pain, unspecified 06/24/2018  . Pruritus, unspecified 06/24/2018  . Shortness of breath 06/24/2018  . Acquired absence of right leg below knee ([place]) 06/18/2018  . Anemia in chronic kidney disease 06/18/2018  . Arteriovenous fistula, acquired ([place]) 06/18/2018  .  Hypertensive chronic kidney disease with stage 1 through stage 4 chronic kidney disease, or unspecified chronic kidney disease 06/18/2018  . Coagulation defect, unspecified ([place]) 06/18/2018  . End stage renal disease ([place]) 06/18/2018  . Fluid overload, unspecified 06/18/2018  . Hyperlipidemia, unspecified 06/18/2018  . Iron deficiency anemia, unspecified 06/18/2018  . Other disorders resulting from impaired renal tubular function 06/18/2018  . Patient's noncompliance with other medical treatment and regimen 06/18/2018  . Secondary hyperparathyroidism of renal origin ([place]) 06/18/2018  . Type 2 diabetes mellitus with diabetic neuropathy, unspecified ([place]) 06/18/2018  . Type 2 diabetes mellitus with other diabetic kidney complication ([place]) 53/97/6734  . Below knee amputation status 01/28/2016  . Aftercare following surgery of the circulatory system, [place] 12/01/2013  . Follow-up examination, following unspecified surgery 11/25/2012  . Atherosclerosis of native arteries of the extremities with ulceration(440.23) 11/25/2012  . Atherosclerotic PVD with ulceration ([place]) 10/01/2012  . Pure hypercholesterolemia 09/26/2012  . Atherosclerosis of native arteries of the extremities, unspecified 09/26/2012  . Type II or unspecified type diabetes mellitus with neurological manifestations, not stated as uncontrolled(250.60) 09/26/2012  . Type II or unspecified type diabetes mellitus with ophthalmic manifestations,  not stated as uncontrolled(250.50) 09/26/2012  . blurred vision due to refraction problems 09/26/2012  . poor dentition 09/26/2012  . poor dentition 09/26/2012  . Insomnia 09/26/2012  . Hypertension 09/26/2012  . Tobacco use disorder 09/26/2012  . Peripheral vascular disease 09/26/2012  . BKA right leg 09/26/2012  . Vitamin D deficiency aeb lab values of 16.5 ng/mL 09/26/2012  . Routine general medical examination at a health care facility 09/26/2012  . GERD (gastroesophageal reflux disease)  09/26/2012  . Sensorineural hearing loss (Per ENT) 09/26/2012  . constipation 09/26/2012  . Osteomyelitis of left great toe 09/26/2012  . Toe osteomyelitis, left ([place]) 09/26/2012  . 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 A/V FISTULAGRAM Left 01/20/2020   Procedure: A/V FISTULAGRAM;  Surgeon: Serafina Mitchell, MD;  Location: [facility];  Service: Cardiovascular;  Laterality: Left;  . AMPUTATION Right 07/17/2011  . AMPUTATION Left 10/03/2012   Procedure: AMPUTATION DIGIT;  Surgeon: Serafina Mitchell, MD;  Location: [facility] OR;  Service: Vascular;  Laterality: Left;  Amputation of left Great  toe  . AV FISTULA PLACEMENT Left 08/30/2017   Procedure: CREATION OF LEFT BRACHIOCEPHALIC ARTERIOVENOUS FISTULA;  Surgeon: Serafina Mitchell, MD;  Location: [facility];  Service: Vascular;  Laterality: Left;  . COLONOSCOPY    . PERIPHERAL ARTERIAL STENT GRAFT N/A 2010  . STUMP REVISION Right 01/28/2016   Procedure: Revision Right Below Knee Amputation;  Surgeon: Newt Minion, MD;  Location: [facility];  Service: Orthopedics;  Laterality: Right;       Family History  Problem Relation Age of Onset  . Diabetes Mother   . Hypertension Mother   . Cancer Father   . Colon cancer Neg Hx     Social History   Tobacco Use  . Smoking status: Current Every Day Smoker    Packs/day: 0.50    Years: 30.00    Pack years: 15.00    Types: Cigarettes  . Smokeless tobacco: Never Used  . Tobacco comment:  down from 2 packs/day  Vaping Use  . Vaping Use: Never used  Substance Use Topics  . Alcohol use: No    Alcohol/week: 0.0 standard drinks  . Drug use: No    Home Medications Prior to Admission medications   Medication Sig Start Date End Date Taking? Authorizing Provider  amLODipine (NORVASC) 10 MG tablet Take 10 mg by mouth daily.    [provider]  aspirin EC 81 MG tablet Take 81 mg by mouth daily.    [provider]  bisacodyl  (DULCOLAX) 5 MG EC tablet Take 5 mg by mouth daily as needed for moderate constipation.     [provider]  Cholecalciferol (PA VITAMIN D-3) 50 MCG (2000 UT) CAPS Take 2,000 Units by mouth daily.    [provider]  cloNIDine (CATAPRES - DOSED IN MG/24 HR) 0.3 mg/24hr patch Place 0.3 mg onto the skin every Monday. Weekly    [provider]  cloNIDine (CATAPRES - DOSED IN MG/24 HR) 0.3 mg/24hr patch Place onto the skin.    [provider]  Evolocumab (REPATHA SURECLICK) 259 MG/ML SOAJ Inject 140 mg into the skin every 30 (thirty) days.     [provider]  furosemide (LASIX) 40 MG tablet Take 80 mg by mouth daily.    [provider]  hydrALAZINE (APRESOLINE) 100 MG tablet Take 100 mg by mouth 3 (three) times daily.    [provider]  Insulin Glargine (LANTUS SOLOSTAR) 100 UNIT/ML Solostar Pen Inject 75 Units into the skin at bedtime.     [provider]  loratadine (CLARITIN) 10 MG tablet Take 10 mg by mouth daily as needed for allergies.     [provider]  Methoxy PEG-Epoetin Beta (MIRCERA IJ) Mircera 01/28/20 01/26/21  [provider]  metoprolol succinate (TOPROL-XL) 100 MG 24 hr tablet Take 100 mg by mouth daily. Take with or immediately following a meal.    [provider]  pantoprazole (PROTONIX) 40 MG tablet Take 40 mg by mouth daily.    [provider]  rosuvastatin (CRESTOR) 20 MG tablet Take 20 mg by mouth daily.     [provider]  senna-docusate (SENNA-PLUS) 8.6-50 MG tablet Take 1 tablet by mouth 2 (two) times daily.    [provider]    Allergies    Oxycodone-acetaminophen  Review of Systems   Review of Systems  Respiratory: Positive for shortness of breath.   All other systems reviewed and are negative.   Physical Exam Updated Vital Signs BP (!) 129/59 (BP Location: Right Arm)   Pulse (!) 103   Temp 97.7 F (36.5 C) (Oral)   Resp 17   Ht 5\' 7"   (1.702 m)   Wt 59 kg   SpO2 96%   BMI 20.36 kg/m   Physical Exam Vitals and nursing note reviewed.  Constitutional:      Appearance: He is well-developed.  HENT:     Head: Normocephalic and atraumatic.     Mouth/Throat:     Mouth: Mucous membranes are moist.     Pharynx: Oropharynx is clear.  Eyes:     Extraocular Movements: Extraocular movements intact.     Pupils: Pupils are equal, round, and reactive to light.  Cardiovascular:     Rate and Rhythm: Normal rate and regular rhythm.  Pulmonary:     Effort: Tachypnea present.     Breath sounds: Rhonchi present.  Abdominal:     General: Bowel sounds are normal.     Palpations: Abdomen is soft.  Musculoskeletal:     Cervical back: Normal range of motion and neck supple.     Comments: Right bka  LUE fistula with good thrill  Skin:    General: Skin is warm.     Capillary Refill: Capillary refill takes less than 2 seconds.  Neurological:     General: No focal deficit present.     Mental Status: He is alert and oriented to person, place, and time.  Psychiatric:        Mood and Affect: Mood normal.        Behavior: Behavior normal.     ED Results / Procedures / Treatments   Labs (all labs ordered are listed, but only abnormal results are displayed) Labs Reviewed  BASIC METABOLIC PANEL - Abnormal; Notable for the following components:      Result Value   Chloride 92 (*)    Glucose, Bld 219 (*)    BUN 59 (*)    Creatinine, Ser 11.89 (*)    GFR, Estimated 4 (*)    Anion gap 19 (*)    All other components within normal limits  CBC WITH DIFFERENTIAL/PLATELET - Abnormal; Notable for the following components:   WBC 13.2 (*)    Neutro Abs 10.8 (*)    All other components within normal limits  BRAIN NATRIURETIC PEPTIDE - Abnormal; Notable for the following components:   B Natriuretic Peptide >4,500.0 (*)  All other components within normal limits  LACTIC ACID, PLASMA - Abnormal; Notable for the following components:    Lactic Acid, Venous 2.7 (*)    All other components within normal limits  TROPONIN I (HIGH SENSITIVITY) - Abnormal; Notable for the following components:   Troponin I (High Sensitivity) 256 (*)    All other components within normal limits  RESP PANEL BY RT-PCR (FLU A&B, COVID) [id number]  LACTIC ACID, PLASMA    EKG EKG Interpretation  Date/Time:  Tuesday August 17 2020 13:43:30 EDT Ventricular Rate:  99 PR Interval:  134 QRS Duration: 169 QT Interval:  386 QTC Calculation: 496 R Axis:   93 Text Interpretation: Sinus tachycardia Atrial premature complex Left bundle branch block No significant change since last tracing Confirmed by Isla Pence [phone]) on 08/17/2020 1:59:10 PM   Radiology DG Chest Port 1 View  Result Date: 08/17/2020 CLINICAL DATA:  Shortness of breath. EXAM: PORTABLE CHEST 1 VIEW COMPARISON:  June 12, 2018. FINDINGS: Stable cardiomediastinal silhouette. No pneumothorax or pleural effusion is noted. Lungs are clear. Bony thorax is unremarkable. IMPRESSION: No active disease. Aortic Atherosclerosis (ICD10-I70.0). Electronically Signed   By: Marijo Conception M.D.   On: 08/17/2020 14:35    Procedures Procedures   Medications Ordered in ED Medications - No data to display  ED Course  I have reviewed the triage vital signs and the nursing notes.  Pertinent labs & imaging results that were available during my care of the patient were reviewed by me and considered in my medical decision making (see chart for details).    MDM Rules/Calculators/A&P                          Pt's O2 sats are in the mid-90s on 4L.  CXR is clear and does not show CHF like I expected with missing dialysis.  Pt's troponin is elevated, but he does not have cp.  Pt d/w IMTS for admission.  Nephrology has been called for dialysis.  Final Clinical Impression(s) / ED Diagnoses Final diagnoses:  Acute respiratory failure with hypoxia (HCC)  Elevated troponin  ESRD on hemodialysis  [facility])    Rx / DC Orders ED Discharge Orders    None       Isla Pence, MD 08/17/20 1555

## 2020-08-17 NOTE — ED Triage Notes (Signed)
HR, Pt arrived via [facility] with c/c of shOB from clinic with low O2 sats. Pt has been low O2 for 2x days. Pt had sats a 80% --> 90% 4L upon . Pt missed dialysis Monday. Pt A&O x4 upon arrival. Denies CP.   127/70, 97HR, BS 204

## 2020-08-17 NOTE — ED Notes (Signed)
Pt in CT.

## 2020-08-17 NOTE — Telephone Encounter (Signed)
Spoke to the nurse at [facility] to inform her of patient's medical status here in the office. Patient also requested [facility] transportation to take him to E.D.. The nurse stated that she would send transport right away. Patient was able to ambulate to the wheelchair and this writer waited with the patient until [facility] trasport arrived. Nurse at [facility] was notified of pick-up. Patient stated that he was feeling a little better but was encouraged to go to the ED for further evaluation.

## 2020-08-17 NOTE — Telephone Encounter (Signed)
[facility] called in stating they were returning nurse call, stating the patient is currently in the office and his blood pressure is dropping, placed rep on hold to find out more information but before I could find out any info the rep disconnected call

## 2020-08-17 NOTE — H&P (Signed)
Date: 08/17/2020               Patient Name:  Gary [name] MRN: [id number]  DOB: 1955/05/07 Age / Sex: 65 y.o., male   PCP: Janifer Adie, MD         Medical Service: Internal Medicine Teaching Service         Attending Physician: Dr. Sid Falcon, MD    First Contact: Dr. Shon Baton Pager: [phone]  Second Contact: Dr. Darrick Meigs Pager: [phone]       After Hours (After 5p/  First Contact Pager: [phone]  weekends / holidays): Second Contact Pager: [phone]   Chief Complaint: shortness of breath  History of Present Illness:  Gary [name] is a 65 year old chronically ill male with ESRD on HD MWF, combined heart failure (recently diagnosed), COPD (per [facility] provider), tobacco use disorder, hypertension, hyperlipidemia, uncontrolled diabetes, and PVD s/p right BKA and left first toe. He is presenting for acute dyspnea. History was obtained from the patient and from PCP at [facility].  He had initially presented to [facility] 5 days prior for evaluation of some left foot wounds. He was referred to podiatry and an appointment was arranged for today. When he presented to the podiatry office today, he was found to be hypoxic, with O2 saturations in the 70s, requiring 4L of supplemental oxygen. He was then transferred over to [facility], who then transferred him to [facility] ED for further evaluation. It is still not quite clear why he was transferred to [facility] prior to the ED.  Also of note, he was unable to attend his usual Monday HD session yesterday due to transportation issues.   He notes that he began experiencing increased shortness of breath 2-3 days ago. Other than that, he denies other respiratory symptoms including cough, rhinorrhea, or sore throat. He denies chest pain, orthopnea, or palpitations. Denies fevers or chills, change in appetite or other symptoms.   Meds:  No current facility-administered medications on file prior to encounter.   Current Outpatient Medications on File Prior to  Encounter  Medication Sig Dispense Refill  . amLODipine (NORVASC) 10 MG tablet Take 10 mg by mouth daily.    [place] Kitchen aspirin EC 81 MG tablet Take 81 mg by mouth daily.    . bisacodyl (DULCOLAX) 5 MG EC tablet Take 5 mg by mouth daily as needed for moderate constipation.     . Cholecalciferol (PA VITAMIN D-3) 50 MCG (2000 UT) CAPS Take 2,000 Units by mouth daily.    . cloNIDine (CATAPRES - DOSED IN MG/24 HR) 0.3 mg/24hr patch Place 0.3 mg onto the skin every Monday. Weekly    . cloNIDine (CATAPRES - DOSED IN MG/24 HR) 0.3 mg/24hr patch Place onto the skin.    . Evolocumab (REPATHA SURECLICK) 767 MG/ML SOAJ Inject 140 mg into the skin every 30 (thirty) days.     . furosemide (LASIX) 40 MG tablet Take 80 mg by mouth daily.    . hydrALAZINE (APRESOLINE) 100 MG tablet Take 100 mg by mouth 3 (three) times daily.    . Insulin Glargine (LANTUS SOLOSTAR) 100 UNIT/ML Solostar Pen Inject 75 Units into the skin at bedtime.     [place] Kitchen loratadine (CLARITIN) 10 MG tablet Take 10 mg by mouth daily as needed for allergies.     . Methoxy PEG-Epoetin Beta (MIRCERA IJ) Mircera    . metoprolol succinate (TOPROL-XL) 100 MG 24 hr tablet Take 100 mg by mouth daily. Take with or  immediately following a meal.    . pantoprazole (PROTONIX) 40 MG tablet Take 40 mg by mouth daily.    . rosuvastatin (CRESTOR) 20 MG tablet Take 20 mg by mouth daily.     [place] Kitchen senna-docusate (SENNA-PLUS) 8.6-50 MG tablet Take 1 tablet by mouth 2 (two) times daily.      Allergies: Allergies as of 08/17/2020 - Review Complete 08/17/2020  Allergen Reaction Noted  . Oxycodone-acetaminophen Rash and Other (See Comments) 10/09/2012   Past Medical History:  Diagnosis Date  . Blurred vision   . Chronic kidney disease    sees [facility]  . Constipation   . Constipation   . Diabetes mellitus    type 2  . GERD (gastroesophageal reflux disease)   . High cholesterol   . Hypertension   . Insomnia   . Peripheral artery disease ([place])   . Port-wine  stain of face    Left V1 distribution, including upper eyelid  . Sensorineural hearing loss    left ear  . Vitamin D deficiency     Family History:  Family History  Problem Relation Age of Onset  . Diabetes Mother   . Hypertension Mother   . Cancer Father   . Colon cancer Neg Hx      Social History:  Resides in [place].  Smokes 1 ppd of cigarettes x 50 years Denies alcohol use. Denies recreational drug use.  Review of Systems: A complete ROS was negative except as per HPI.   Physical Exam: Blood pressure 137/78, pulse (!) 118, temperature 97.7 F (36.5 C), temperature source Oral, resp. rate (!) 26, height $RemoveBe'5\' 7"'AxShUIFcD$  (1.702 m), weight 59 kg, SpO2 95 %. General: chronically ill appearing, in NAD HENT: atraumatic, no rhinorhea Eyes: no scleral icterus, EOM intact. Cardiac: RRR, no JVD, no peripheral edema, extremities warm, LUE fistula with palpable thrill Pulm: breathing comfortably on 4L, no accessory muscle use, diffuse expiratory wheezes, with rhonchi as the bases GI: soft, non-tender Neuro: a/o x4.   MSK: right BKA. Apparent surgical amputation of the left first toe. Moves all extremities  Skin: left foot--2nd toe edematous without erythema. Ulcerations on the medial and lateral aspect of the 2nd toe without discharge. Not tender to palpation.   EKG: LBBB, QTc 496  CXR: peribronchial cuffing, no effusion or focal infiltrate CTA chest: cardiomegaly. No PE. Mediastinal and hilar lymphadenopathy. Moderate peribronchial thickening, greater in the lower lung zones, that suggest bronchitis. Patchy mosaic patter of ground glass attenuation in the lungs consistent with reactive airways vs respiratory bronchitis vs COP vs hypersensitivity pneumonitis vs atypical pneumonia.   Assessment & Plan by Problem: Principal Problem:   Acute respiratory failure ([place])  65 year old chronically ill male with ESRD on HD, CHF, +/- COPD who was admitted for acute respiratory failure that is  likely secondary to volume overload in the setting of missed HD session. Mediastinal and hilar adenopathy was incidentally noted on CTA and is recommended to be followed up with PET scan imaging.    Acute hypoxic respiratory failure.  -I suspect that volume overload in the setting of missed HD and heart failure is the primary driving force of this. BNP >4500 in the setting of known combined heart failure and mosaicism on chest CTA that could refect pulm edema. Exam is not consistent with volume overload although I would not weight heavily on this. I am not certain what his dry weight is, his weight is not above prior measurements.  -On initially speaking to his  [facility] provider, who noted him to have COPD, I considered a COPD exacerbation. Interestingly, when I reviewed his recent PFTs at [facility], they are not consistent with an obstructive process, with FEV1 103% predicted, FEV1/FVC 82%, TLC normal at 5L. DCLO is reduced, however I am unsure what his hemoglobin was at that time.  50 pack year smoking history. -The CTA report was also interesting in suggesting bronchitis vs COP vs pneumonitis however I believe this may reflect pulmonary edema. He denied any other symptoms on ROS however even during our visit, he did have a cough which I am not sure is chronic or new.  -PE r/o on CTA SIRS criteria met with HR >90, WBC >12k, lactate >2, acute respiratory failure.Infectious causes considered. He does have a mild leukocytosis however no left shift. I do not have a high suspicion right now.  Mediastinal and hilar lymphadenopathy Tobacco use disorder. 50 pack year smoking hx ESRD on HD MWF, Metabolic bone disease Plan -HD tonight and likely again tomorrow. Suspect respiratory status will improve with this.  -duonebs q6h, pulmicort nebulizer -will hold off on prednisone for now.  If no improvement tomorrow, will consider addition of prednisone. -mediastinal and hilar adenopathy on CTA suggests obtaining  PET for further evaluation however this will need to be done after discharge.  -nicotine patch  Acute vs chronic combined heart failure (EF 25-30%). Suspect acute component is driven by missed HD.   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 May benefit from this in the future.  Does not appear volume up on exam although BNP is markedly elevated >4500. Low suspicion for low output heart failure with warm extremities and stable blood pressures. Unclear how much of his volume status is managed with HD vs lasix Type 2 NSTEMI 2/2 demand ischemia. No chest pain. trops 256>262.  Aortic atherosclerosis as noted on CT Hypertension Severe PAD s/p BKA and left first toe amputation. Has been following with podiatry for a wound on the left 2nd toe. This does not appear infected.  Plan -continue home metoprolol, hydralazine, lasix -continue statin  Uncontrolled insulin dependent type 2 diabetes mellitus. Last A1C >9 per [facility] provider. On lantus and trulicity at home but is not consistent with taking it. [facility] provider notes that lantus was recently decreased due to symptomatic hypoglycemia.  Plan -Lantus 20U qHS, SSI, adjust as needed    Dispo: Admit patient to Inpatient with expected length of stay greater than 2 midnights.  Signed: Mitzi Hansen, MD Internal Medicine Resident PGY-2 [facility] Pager: [phone] 08/17/2020 8:16 PM    After 5pm on weekdays and 1pm on weekends: On Call pager: [phone]

## 2020-08-17 NOTE — ED Notes (Signed)
Call from [facility] NP, patient was to see podiatry for new wound on left toe. [facility] was concerned as pt was noted to have elevated WBC on their end. Will notify provider of their findings.

## 2020-08-18 ENCOUNTER — Encounter (HOSPITAL_COMMUNITY): Payer: Self-pay | Admitting: Internal Medicine

## 2020-08-18 DIAGNOSIS — R7989 Other specified abnormal findings of blood chemistry: Secondary | ICD-10-CM | POA: Diagnosis present

## 2020-08-18 DIAGNOSIS — R778 Other specified abnormalities of plasma proteins: Secondary | ICD-10-CM | POA: Diagnosis present

## 2020-08-18 DIAGNOSIS — I214 Non-ST elevation (NSTEMI) myocardial infarction: Secondary | ICD-10-CM

## 2020-08-18 LAB — TROPONIN I (HIGH SENSITIVITY)
Troponin I (High Sensitivity): 374 ng/L (ref <18)
Troponin I (High Sensitivity): 437 ng/L (ref <18)
Troponin I (High Sensitivity): 459 ng/L (ref <18)
Troponin I (High Sensitivity): 388 ng/L (ref <18)

## 2020-08-18 LAB — RENAL FUNCTION PANEL
Albumin: 2.5 g/dL — ABNORMAL LOW (ref 3.5–5.0)
Anion gap: 13 (ref 5–15)
BUN: 28 mg/dL — ABNORMAL HIGH (ref 8–23)
CO2: 28 mmol/L (ref 22–32)
Calcium: 8.5 mg/dL — ABNORMAL LOW (ref 8.9–10.3)
Chloride: 95 mmol/L — ABNORMAL LOW (ref 98–111)
Creatinine, Ser: 7.33 mg/dL — ABNORMAL HIGH (ref 0.61–1.24)
GFR, Estimated: 8 mL/min — ABNORMAL LOW (ref >60)
Glucose, Bld: 279 mg/dL — ABNORMAL HIGH (ref 70–99)
Phosphorus: 5.1 mg/dL — ABNORMAL HIGH (ref 2.5–4.6)
Potassium: 3.3 mmol/L — ABNORMAL LOW (ref 3.5–5.1)
Sodium: 136 mmol/L (ref 135–145)

## 2020-08-18 LAB — CBC
HCT: 34.8 % — ABNORMAL LOW (ref 39.0–52.0)
Hemoglobin: 11.1 g/dL — ABNORMAL LOW (ref 13.0–17.0)
MCH: 26.4 pg (ref 26.0–34.0)
MCHC: 31.9 g/dL (ref 30.0–36.0)
MCV: 82.9 fL (ref 80.0–100.0)
Platelets: 196 10*3/uL (ref 150–400)
RBC: 4.2 MIL/uL — ABNORMAL LOW (ref 4.22–5.81)
RDW: 13.6 % (ref 11.5–15.5)
WBC: 12.2 10*3/uL — ABNORMAL HIGH (ref 4.0–10.5)
nRBC: 0 % (ref 0.0–0.2)

## 2020-08-18 LAB — LACTIC ACID, PLASMA
Lactic Acid, Venous: 3 mmol/L (ref 0.5–1.9)
Lactic Acid, Venous: 2.4 mmol/L (ref 0.5–1.9)

## 2020-08-18 LAB — HEPATITIS B SURFACE ANTIBODY, QUANTITATIVE: Hep B S AB Quant (Post): 4 m[IU]/mL — ABNORMAL LOW (ref >9.9)

## 2020-08-18 LAB — GLUCOSE, CAPILLARY
Glucose-Capillary: 108 mg/dL — ABNORMAL HIGH (ref 70–99)
Glucose-Capillary: 184 mg/dL — ABNORMAL HIGH (ref 70–99)
Glucose-Capillary: 181 mg/dL — ABNORMAL HIGH (ref 70–99)

## 2020-08-18 MED ORDER — DOXERCALCIFEROL 4 MCG/2ML IV SOLN
INTRAVENOUS | Status: AC
  Administered 2020-08-18: 6 ug via INTRAVENOUS
  Filled 2020-08-18: qty 4

## 2020-08-18 MED ORDER — CHLORHEXIDINE GLUCONATE CLOTH 2 % EX PADS
6.0000 | MEDICATED_PAD | Freq: Every day | CUTANEOUS | Status: DC
  Administered 2020-08-18: 6 via TOPICAL

## 2020-08-18 NOTE — Evaluation (Signed)
Occupational Therapy Evaluation Patient Details Name: Gary [name] MRN: [id number] DOB: [birth year] Today's Date: 08/18/2020    History of Present Illness Gary [name] is a 65 year old chronically ill male with ESRD on HD MWF, combined heart failure (recently diagnosed), COPD (per PACE provider), tobacco use disorder, hypertension, hyperlipidemia, uncontrolled diabetes, and PVD s/p right BKA and left first toe. He is presenting for acute dyspnea.  History was obtained from the patient and from PCP at [facility]   PTA, pt lives with spouse and reports Modified Independence with ADLs and mobility using RW and prosthetic LE. Pt does not wear O2 at home. Pt presents now with significant deficits in strength, balance and endurance. Pt overall Mod A for bed mobility, Mod A x 1 (or Min A x 2) for sit to stands at bedside using RW. With prolonged standing, pt progressing to require Mod A x 2 for side steps at bedside and up to Max A for maintaining standing balance for assist with peri care. Pt requires min guard for UB ADLs sitting EOB and and grossly Mod A for LB ADLs due to deficits. Pt with significant shakiness and instability in standing so unable to progress mobility further today. Based on functional decline from PLOF and pt's motivation to improve, recommend CIR consult for postacute rehab to maximize safety/independence.     Follow Up Recommendations  CIR;Supervision/Assistance - 24 hour    Equipment Recommendations  3 in 1 bedside commode (for placement over toilet)    Recommendations for Other Services Rehab consult     Precautions / Restrictions Precautions Precautions: Fall;Other (comment) Precaution Comments: hx of R BKA with prosthetic in room Restrictions Weight Bearing Restrictions: No      Mobility Bed Mobility Overal bed mobility: Needs Assistance Bed Mobility: Supine to Sit;Sit to Supine     Supine to sit: Mod assist;HOB elevated Sit to supine: Min  assist   General bed mobility comments: Mod A for power up of trunk and shakiness noted, difficulty gaining balance EOB. Min A for guiding of trunk safely and avoid hitting head on bedrail    Transfers Overall transfer level: Needs assistance Equipment used: Rolling walker (2 wheeled) Transfers: Sit to/from Stand Sit to Stand: Mod assist         General transfer comment: Mod A x 1 or Min A x 2 for sit to stand from bedside with RW, fatigued quickly requiring up to Max A to maintain balance in standing for peri care with increasing shakiness. Pt able to take minimal side steps at bedside with RW at Mod A x2 for return to bed (in prep for HD transport)    Balance Overall balance assessment: Needs assistance Sitting-balance support: Bilateral upper extremity supported;Feet supported Sitting balance-Leahy Scale: Poor Sitting balance - Comments: reliant on UE support or external support from therapist   Standing balance support: Bilateral upper extremity supported;During functional activity Standing balance-Leahy Scale: Poor Standing balance comment: reliant on UE support and external support                           ADL either performed or assessed with clinical judgement   ADL Overall ADL's : Needs assistance/impaired Eating/Feeding: Set up;Sitting   Grooming: Min guard;Sitting   Upper Body Bathing: Min guard;Sitting   Lower Body Bathing: Moderate assistance;Sit to/from stand;Sitting/lateral leans Lower Body Bathing Details (indicate cue type and reason): Able to reach down to bottom LE with  min guard but required Max A for LB bathing of peri region in standing due to shakiness and weakness Upper Body Dressing : Min guard;Sitting   Lower Body Dressing: Moderate assistance;Sit to/from stand;Sitting/lateral leans Lower Body Dressing Details (indicate cue type and reason): Able to don prosthetic LE and L sock sitting EOB with min guard for balance. Will need assist for  balance and donning around waist in standing     Toileting- Clothing Manipulation and Hygiene: Moderate assistance;Sitting/lateral lean;Sit to/from stand         General ADL Comments: Limited by severe weakness, shakiness with movement and impaired balance in standing. VSS on RA     Vision Baseline Vision/History: Wears glasses Patient Visual Report: No change from baseline Vision Assessment?: No apparent visual deficits     Perception     Praxis      Pertinent Vitals/Pain Pain Assessment: No/denies pain     Hand Dominance Right   Extremity/Trunk Assessment Upper Extremity Assessment Upper Extremity Assessment: Generalized weakness   Lower Extremity Assessment Lower Extremity Assessment: Defer to PT evaluation   Cervical / Trunk Assessment Cervical / Trunk Assessment: Kyphotic   Communication Communication Communication: No difficulties   Cognition Arousal/Alertness: Awake/alert Behavior During Therapy: WFL for tasks assessed/performed Overall Cognitive Status: Impaired/Different from baseline Area of Impairment: Following commands;Awareness                       Following Commands: Follows one step commands with increased time;Follows multi-step commands with increased time   Awareness: Emergent   General Comments: Very pleasant and participatory, shows insight into considerable weakness.   General Comments  Received on 2 L O2, SpO2 100%. SpO2 98% on RA, HR and BP WFL though pt citing dizziness in standing. Elevated troponins 437 (noted suspected demand ischemia) and pt denies chest pain.    Exercises     Shoulder Instructions      Home Living Family/patient expects to be discharged to:: Private residence Living Arrangements: Spouse/significant other Available Help at Discharge: Family;Available 24 hours/day Type of Home: House Home Access: Stairs to enter;Ramped entrance Entrance Stairs-Number of Steps: 3   Home Layout: One level      Bathroom Shower/Tub: Teacher, early years/pre: Standard     Home Equipment: Tub bench;Bedside commode;Walker - 2 wheels;Grab bars - tub/shower   Additional Comments: reports his wheelchair is messed up      Prior Functioning/Environment Level of Independence: Independent with assistive device(s)        Comments: Reports able to ambulate with RW and prosthetic LE. Typically Modified Independent with ADLs. Wife completes IADLs. PACE assists with transport to HD        OT Problem List: Decreased strength;Decreased activity tolerance;Impaired balance (sitting and/or standing);Decreased cognition      OT Treatment/Interventions: Self-care/ADL training;Therapeutic exercise;Energy conservation;DME and/or AE instruction;Therapeutic activities;Patient/family education;Balance training    OT Goals(Current goals can be found in the care plan section) Acute Rehab OT Goals Patient Stated Goal: feel better, get strength back OT Goal Formulation: With patient Time For Goal Achievement: 09/01/20 Potential to Achieve Goals: Good  OT Frequency: Min 2X/week   Barriers to D/C:            Co-evaluation PT/OT/SLP Co-Evaluation/Treatment: Yes Reason for Co-Treatment: For patient/therapist safety;To address functional/ADL transfers   OT goals addressed during session: ADL's and self-care      AM-PAC OT "6 Clicks" Daily Activity     Outcome Measure Help from another person  eating meals?: A Little Help from another person taking care of personal grooming?: A Little Help from another person toileting, which includes using toliet, bedpan, or urinal?: A Lot Help from another person bathing (including washing, rinsing, drying)?: A Lot Help from another person to put on and taking off regular upper body clothing?: A Little Help from another person to put on and taking off regular lower body clothing?: A Lot 6 Click Score: 15   End of Session Equipment Utilized During Treatment: Gait  belt;Rolling walker;Oxygen Nurse Communication: Mobility status  Activity Tolerance: Patient limited by fatigue Patient left: in bed;with call bell/phone within reach;with bed alarm set  OT Visit Diagnosis: Unsteadiness on feet (R26.81);Other abnormalities of gait and mobility (R26.89);Muscle weakness (generalized) (M62.81)                Time: 1005-1030 OT Time Calculation (min): 25 min Charges:  OT General Charges $OT Visit: 1 Visit OT Evaluation $OT Eval Moderate Complexity: 1 Mod  Malachy Chamber, OTR/L Acute Rehab Services Office: [phone]  Layla Maw 08/18/2020, 10:49 AM

## 2020-08-18 NOTE — Progress Notes (Addendum)
Rehab Admissions Coordinator Note:  Patient was screened by Cleatrice Burke for appropriateness for an Inpatient Acute Rehab Consult per therapy recommendations.  At this time, we are recommending await further progress with therapies with ongoing hemodialysis with further UF. Hopefully he will functionally improve with more fluid removal. I will follow.  Cleatrice Burke RN MSN 08/18/2020, 1:46 PM  I can be reached at [phone].

## 2020-08-18 NOTE — Evaluation (Signed)
Physical Therapy Evaluation Patient Details Name: Gary [name] MRN: [id number] DOB: June 20, 1955 Today's Date: 08/18/2020   History of Present Illness  Gary [name] is a 65 year old chronically ill male with ESRD on HD MWF, combined heart failure (recently diagnosed), COPD (per PACE provider), tobacco use disorder, hypertension, hyperlipidemia, uncontrolled diabetes, and PVD s/p right BKA and left first toe. He is presenting for acute dyspnea.  History was obtained from the patient and from PCP at [facility]   Patient received sitting at EOB with OT, very tremulous and shaky and clearly having difficulty getting balance at EOB. After extended period of sitting, finally able to stabilize and able to attempt standing but had a difficult time tolerated standing even short periods for pericare. Weak and tremulous. VSS on RA today. Left in bed with all needs met, bed alarm active. Seems to be quite far away from his baseline level of function- would benefit from intensive therapies in CIR setting.     Follow Up Recommendations CIR;Supervision for mobility/OOB    Equipment Recommendations  Rolling walker with 5" wheels;Wheelchair (measurements PT);3in1 (PT);Wheelchair cushion (measurements PT)    Recommendations for Other Services       Precautions / Restrictions Precautions Precautions: Fall;Other (comment) Precaution Comments: hx of R BKA with prosthetic in room Restrictions Weight Bearing Restrictions: No      Mobility  Bed Mobility Overal bed mobility: Needs Assistance Bed Mobility: Supine to Sit;Sit to Supine     Supine to sit: Mod assist;HOB elevated Sit to supine: Min assist   General bed mobility comments: Mod A for power up of trunk and shakiness noted, difficulty gaining balance EOB. Min A for guiding of trunk safely and avoid hitting head on bedrail    Transfers Overall transfer level: Needs assistance Equipment used: Rolling walker (2  wheeled) Transfers: Sit to/from Stand Sit to Stand: Mod assist         General transfer comment: Mod A x 1 or Min A x 2 for sit to stand from bedside with RW, fatigued quickly requiring up to Max A to maintain balance in standing for peri care with increasing shakiness. Pt able to take minimal side steps at bedside with RW at Mod A x2 for return to bed (in prep for HD transport)  Ambulation/Gait             General Gait Details: unable- fatigue and weakness  Stairs            Wheelchair Mobility    Modified Rankin (Stroke Patients Only)       Balance Overall balance assessment: Needs assistance Sitting-balance support: Bilateral upper extremity supported;Feet supported Sitting balance-Leahy Scale: Poor Sitting balance - Comments: reliant on UE support or external support from therapist   Standing balance support: Bilateral upper extremity supported;During functional activity Standing balance-Leahy Scale: Poor Standing balance comment: reliant on UE support and external support                             Pertinent Vitals/Pain Pain Assessment: No/denies pain    Home Living Family/patient expects to be discharged to:: Private residence Living Arrangements: Spouse/significant other Available Help at Discharge: Family;Available 24 hours/day Type of Home: House Home Access: Stairs to enter;Ramped entrance   Entrance Stairs-Number of Steps: 3 Home Layout: One level Home Equipment: Tub bench;Bedside commode;Walker - 2 wheels;Grab bars - tub/shower Additional Comments: reports his wheelchair is messed up  Prior Function Level of Independence: Independent with assistive device(s)         Comments: Reports able to ambulate with RW and prosthetic LE. Typically Modified Independent with ADLs. Wife completes IADLs. PACE assists with transport to HD     Hand Dominance   Dominant Hand: Right    Extremity/Trunk Assessment   Upper Extremity  Assessment Upper Extremity Assessment: Defer to OT evaluation    Lower Extremity Assessment Lower Extremity Assessment: Generalized weakness    Cervical / Trunk Assessment Cervical / Trunk Assessment: Kyphotic  Communication   Communication: No difficulties  Cognition Arousal/Alertness: Awake/alert Behavior During Therapy: WFL for tasks assessed/performed Overall Cognitive Status: Impaired/Different from baseline Area of Impairment: Following commands;Awareness                       Following Commands: Follows one step commands with increased time;Follows multi-step commands with increased time   Awareness: Emergent   General Comments: Very pleasant and participatory, shows insight into considerable weakness.      General Comments General comments (skin integrity, edema, etc.): on 2LPM O2 upon entry, able to maintain 98% on RA, HR and BP WFL but dizzy in standing. No chest pain.    Exercises     Assessment/Plan    PT Assessment Patient needs continued PT services  PT Problem List Decreased strength;Decreased knowledge of use of DME;Decreased activity tolerance;Decreased balance;Decreased mobility;Decreased coordination       PT Treatment Interventions DME instruction;Balance training;Gait training;Stair training;Functional mobility training;Patient/family education;Therapeutic activities;Therapeutic exercise    PT Goals (Current goals can be found in the Care Plan section)  Acute Rehab PT Goals Patient Stated Goal: feel better, get strength back PT Goal Formulation: With patient Time For Goal Achievement: 09/01/20 Potential to Achieve Goals: Good    Frequency Min 3X/week   Barriers to discharge        Co-evaluation   Reason for Co-Treatment: For patient/therapist safety;To address functional/ADL transfers   OT goals addressed during session: ADL's and self-care       AM-PAC PT "6 Clicks" Mobility  Outcome Measure Help needed turning from your  back to your side while in a flat bed without using bedrails?: A Little Help needed moving from lying on your back to sitting on the side of a flat bed without using bedrails?: A Lot Help needed moving to and from a bed to a chair (including a wheelchair)?: A Lot Help needed standing up from a chair using your arms (e.g., wheelchair or bedside chair)?: A Lot Help needed to walk in hospital room?: Total Help needed climbing 3-5 steps with a railing? : Total 6 Click Score: 11    End of Session Equipment Utilized During Treatment: Gait belt Activity Tolerance: Patient tolerated treatment well Patient left: in bed;with call bell/phone within reach;with bed alarm set Nurse Communication: Mobility status;Need for lift equipment PT Visit Diagnosis: Unsteadiness on feet (R26.81);Difficulty in walking, not elsewhere classified (R26.2);Muscle weakness (generalized) (M62.81)    Time: 5400-8676 PT Time Calculation (min) (ACUTE ONLY): 18 min   Charges:   PT Evaluation $PT Eval Moderate Complexity: 1 Mod          Windell Norfolk, DPT, PN1   Supplemental Physical Therapist [place]    Pager [phone] Acute Rehab Office [phone]

## 2020-08-18 NOTE — Plan of Care (Signed)
?  Problem: Education: ?Goal: Knowledge of General Education information will improve ?Description: Including pain rating scale, medication(s)/side effects and non-pharmacologic comfort measures ?Outcome: Progressing ?  ?Problem: Health Behavior/Discharge Planning: ?Goal: Ability to manage health-related needs will improve ?Outcome: Progressing ?  ?Problem: Clinical Measurements: ?Goal: Ability to maintain clinical measurements within normal limits will improve ?Outcome: Progressing ?Goal: Will remain free from infection ?Outcome: Progressing ?Goal: Diagnostic test results will improve ?Outcome: Progressing ?Goal: Cardiovascular complication will be avoided ?Outcome: Progressing ?  ?Problem: Nutrition: ?Goal: Adequate nutrition will be maintained ?Outcome: Progressing ?  ?Problem: Coping: ?Goal: Level of anxiety will decrease ?Outcome: Progressing ?  ?Problem: Elimination: ?Goal: Will not experience complications related to bowel motility ?Outcome: Progressing ?Goal: Will not experience complications related to urinary retention ?Outcome: Progressing ?  ?Problem: Pain Managment: ?Goal: General experience of comfort will improve ?Outcome: Progressing ?  ?Problem: Safety: ?Goal: Ability to remain free from injury will improve ?Outcome: Progressing ?  ?Problem: Skin Integrity: ?Goal: Risk for impaired skin integrity will decrease ?Outcome: Progressing ?  ?

## 2020-08-18 NOTE — Progress Notes (Signed)
  Date: 08/18/2020  Patient name: Gary [name]  Medical record number: [id number]  Date of birth: 1956/01/26   I have seen and evaluated Gary [name] and discussed their care with the Residency Team. Briefly, Gary [name] is a 65 year old man with PMH of ESRD on HD, combined CHF, tobacco use, HTN, HLD, PVD s/p right BKA who presented for dyspnea and hypoxia.  He missed HD on Monday.  He was requiring 4LNC and was sent to the hospital.  He has been having SOB for 2-3 days, but denies other symptoms.    CT scan of the chest showed no PE, bronchial thickening, mediastinal and hilar lymphadenopathy and mosaic ground glass attenuation which can be due to many things.  He has had HD after I saw him and was off oxygen and lying flat without issue.  Nephrology was consulted.   Vitals:   08/18/20 0305 08/18/20 0927  BP: (!) 118/52 120/78  Pulse: 87 80  Resp: 20 18  Temp: 97.7 F (36.5 C) (!) 97.5 F (36.4 C)  SpO2: 99% 99%   Gen: Lying in bed, no distress Eyes: Anicteric sclerae CV: RR, NR, no murmur Pulm: Decreased breath sounds, no wheezing, no rales Abd: Soft, NT MSK: Fistula in left upper arm with good thrill, normal bulk, s/p right bka Psych: Pleasant, normal mood, no confusion  Assessment and Plan: I have seen and evaluated the patient as outlined above. I agree with the formulated Assessment and Plan as detailed in the residents' note, with the following changes:   1. Acute hypoxic respiratory failure related to missed HD, combined CHF (acute on chronic) - Continue HD - Continue lasix - Trend troponin - Monitor closely for acute change  2. Type 2 NSTEMI related to missed HD, heart failure - Trend Troponin - now flattened and getting HD - No chest pain or other complaints today - Wean off oxygen - Further volume control with HD  Other issues per Dr. Chase Picket daily note.   Sid Falcon, MD 4/20/20224:30 PM

## 2020-08-18 NOTE — Progress Notes (Addendum)
   Subjective/Interm history: Troponins continued to rise overnight [phone]. Repeat EKG unchanged. 2500cc removed at HD last evening.  Off oxygen this morning. Notes that he is feeling better. We discussed PT/OT recommendations for ongoing therapy at [facility] which he is agreeable to.     Objective:  Vital signs in last 24 hours: Vitals:   08/17/20 2200 08/17/20 2215 08/17/20 2330 08/18/20 0305  BP: (!) 119/56 107/70 116/64 (!) 118/52  Pulse: 81 82 87 87  Resp: $Remo'20 20 20 20  'HdIKk$ Temp:   98.1 F (36.7 C) 97.7 F (36.5 C)  TempSrc:   Oral Oral  SpO2:   100% 99%  Weight:  54.5 kg    Height:       General: chronically ill appearing, laying flat in bed, appears comfortable Cardiac: RRR, extremities warm, no JVD Pulm: breathing comfortably on room air. No expiratory wheezes today. Lung sounds diminished Skin: port wine stain over left face  Assessment/Plan:  Principal Problem:   Acute respiratory failure ([place])  65 year old chronically ill male with ESRD on HD, CHF, +/- COPD who was admitted for acute respiratory failure secondary to volume overload in the setting of missed HD and quickly resolved with undergoing dialysis.  He was evaluated by PT/OT who recommended CIR for ongoing rehabilitation. He is now awaiting evaluation and insurance authorization with them.  Acute hypoxic respiratory failure (resolved) secondary to volume overload in the setting of missed HD session and CHF.  ESRD on HD. 2.5L off last evening. Combined heart failure (EF 25-30%), chronic LBBB. euvolumic on exam Type 2 NSTEMI. Re-evaluated by the night team at which time he continued to deny chest pain. Repeat EKG was unchanged. He will need an ischemic workup at some point however I have low suspicion for his slow rise in troponins to be ACS. Plan -dialysis per nephrology. Hopeful that he will be able to be back to room air with another HD session -continue lasix $RemoveBeforeD'80mg'[id number]$  -continue to trend troponins. Will consult  cardiology if they continue to rise -renal disease precluding GDMT for heart failure -recommend referral to cardiology after discharge   SIRS criteria met on admission with HR >90, WBC >12k, lactate >2. Tachycardia resolved. Remains afebrile, white count and lactate improved. Still have a low suspicion for infectious cause at this time. Most likely related to volume overload.   Physical deconditioning -PT/OT recommending CIR  Insulin dependent type 2 diabetes. Fasting glucose 108 this morning.  -Continue lantus 20U with SSI.  Mediastinal and hilar adenopathy as noted on chest CT from admission Tobacco use disorder--50 pack year smoking history -PET scan to be arranged after discharge based on CT report recommendations  # Peripheral arterial disease with chronic left 2nd toe wound. Follows with podiatry. No sign of infection at this time. Continue antiplatelet therapy with aspirin. # Chronic hypertension, hyperlipidemia, atherosclerotic disease.  -Continue home amlodipine, hydralazine, metoprolol, Asprin, crestor   Best Practice: CODE: FULL Dispo: CIR vs SNF  Mitzi Hansen, MD Internal Medicine Resident PGY-2 [facility] Pager: [phone] 08/18/2020 3:08 PM    After 5pm on weekdays and 1pm on weekends: On Call pager [phone]

## 2020-08-18 NOTE — Progress Notes (Signed)
[facility] Progress Note   Subjective:   Pt seen in room, reports his breathing is slightly better after dialysis yesterday but still not at baseline. Denies CP, palpitations, dizziness, abdominal pain and nausea. Agrees to HD today to get back on MWF schedule.   Objective Vitals:   08/17/20 2200 08/17/20 2215 08/17/20 2330 08/18/20 0305  BP: (!) 119/56 107/70 116/64 (!) 118/52  Pulse: 81 82 87 87  Resp: 20 20 20 20   Temp:   98.1 F (36.7 C) 97.7 F (36.5 C)  TempSrc:   Oral Oral  SpO2:   100% 99%  Weight:  54.5 kg    Height:       Physical Exam General: WDWN male, alert and in NAD Heart: RRR, no murmurs, rubs or gallops. On 2L O2 via [place] Lungs: CTA bilaterally without wheezing, rhonchi or rales Abdomen: Soft, non-tender, non-distended, +BS Extremities: No edema b/l lower extremities Dialysis Access: LUEA VF + bruit  Additional Objective Labs: Basic Metabolic Panel: Recent Labs  Lab 08/17/20 1341 08/17/20 1740 08/18/20 0043  NA 137  --  136  K 4.3  --  3.3*  CL 92*  --  95*  CO2 26  --  28  GLUCOSE 219*  --  279*  BUN 59*  --  28*  CREATININE 11.89* 12.18* 7.33*  CALCIUM 9.1  --  8.5*  PHOS  --   --  5.1*   Liver Function Tests: Recent Labs  Lab 08/18/20 0043  ALBUMIN 2.5*   CBC: Recent Labs  Lab 08/17/20 1341 08/18/20 0043  WBC 13.2* 12.2*  NEUTROABS 10.8*  --   HGB 13.6 11.1*  HCT 42.0 34.8*  MCV 83.2 82.9  PLT 246 196   Blood Culture    Component Value Date/Time   SDES BLOOD LEFT ARM 01/27/2008 0630   SPECREQUEST BOTTLES DRAWN AEROBIC AND ANAEROBIC 10CC EA 01/27/2008 0630   CULT NO GROWTH 5 DAYS 01/27/2008 0630   REPTSTATUS 02/02/2008 FINAL 01/27/2008 0630    CBG: Recent Labs  Lab 08/17/20 2313 08/18/20 0719  GLUCAP 200* 108*    Studies/Results: CT Angio Chest PE W and/or Wo Contrast  Result Date: 08/17/2020 CLINICAL DATA:  Shortness of breath. EXAM: CT ANGIOGRAPHY CHEST WITH CONTRAST TECHNIQUE: Multidetector CT  imaging of the chest was performed using the standard protocol during bolus administration of intravenous contrast. Multiplanar CT image reconstructions and MIPs were obtained to evaluate the vascular anatomy. CONTRAST:  48mL OMNIPAQUE IOHEXOL 350 MG/ML SOLN COMPARISON:  None. FINDINGS: Cardiovascular: The heart is enlarged. No pericardial effusion. Significant age advanced atherosclerotic calcification involving the thoracic aorta, branch vessels and in particular the coronary arteries. There is significant reflux of contrast down the IVC and into the hepatic veins suggesting tricuspid regurgitation. The pulmonary arterial tree is well opacified. No filling defects to suggest pulmonary embolism. Mediastinum/Nodes: Mediastinal and hilar lymphadenopathy. The esophagus is grossly normal. Lungs/Pleura: Moderate peribronchial thickening most notably in the lower lung zones bilaterally suggesting bronchitis. Patchy mosaic pattern of ground-glass attenuation in the lungs. This can be seen with reactive airways disease/asthma respiratory bronchiolitis, cryptogenic organizing pneumonia or hypersensitivity pneumonitis. Atypical/viral pneumonia would be another possibility. No focal airspace consolidation/typical pneumonia. No pleural effusions. Upper Abdomen: No acute upper abdominal findings. Advanced vascular calcifications likely related to the patient's diabetes. Musculoskeletal: No significant bony findings. Review of the MIP images confirms the above findings. IMPRESSION: 1. No CT findings for pulmonary embolism. 2. Significant age advanced atherosclerotic calcification involving the thoracic aorta, branch vessels  and in particular the coronary arteries. 3. Significant reflux of contrast down the IVC and into the hepatic veins suggesting tricuspid regurgitation. 4. Moderate peribronchial thickening most notably in the lower lung zones bilaterally suggesting bronchitis. 5. Patchy mosaic pattern of ground-glass  attenuation in the lungs. This can be seen with reactive airways disease/asthma respiratory bronchiolitis, cryptogenic organizing pneumonia or hypersensitivity pneumonitis. Atypical/viral pneumonia would be another possibility. 6. Mediastinal and hilar lymphadenopathy. Patient may need further evaluation with PET-CT. 7. Aortic atherosclerosis. Aortic Atherosclerosis (ICD10-I70.0). Electronically Signed   By: Marijo Sanes M.D.   On: 08/17/2020 17:18   DG Chest Port 1 View  Result Date: 08/17/2020 CLINICAL DATA:  Shortness of breath. EXAM: PORTABLE CHEST 1 VIEW COMPARISON:  June 12, 2018. FINDINGS: Stable cardiomediastinal silhouette. No pneumothorax or pleural effusion is noted. Lungs are clear. Bony thorax is unremarkable. IMPRESSION: No active disease. Aortic Atherosclerosis (ICD10-I70.0). Electronically Signed   By: Marijo Conception M.D.   On: 08/17/2020 14:35   Medications:  . amLODipine  10 mg Oral Daily  . aspirin EC  81 mg Oral Daily  . Chlorhexidine Gluconate Cloth  6 each Topical Q0600  . doxercalciferol  6 mcg Intravenous Q M,W,F-HD  . furosemide  80 mg Oral Daily  . heparin  5,000 Units Subcutaneous Q8H  . hydrALAZINE  100 mg Oral TID  . insulin aspart  0-9 Units Subcutaneous TID WC  . insulin glargine  20 Units Subcutaneous QHS  . ipratropium-albuterol  3 mL Nebulization BID  . metoprolol succinate  100 mg Oral Daily  . nicotine  21 mg Transdermal Daily  . pantoprazole  40 mg Oral Daily  . rosuvastatin  20 mg Oral Daily  . sevelamer carbonate  3,200 mg Oral TID WC    Dialysis Orders: Center: [facility] on  MWF. 180NRe, 3:45 hours, BFR 400, DFR 800, EDW 59kg, 2K/2Ca, AVF 15g, heparin 2200 unit bolus Hectorol 6 mcg IV q HD  Assessment/Plan: 1.  Hypoxia: O2 sat 79% on RA, improved on [place]. CXR not showing active disease on admission but he does have significantly elevated BNP and rhonchi on exam. Most likely volume overload due to missed HD. Symptoms  improved s/p HD yesterday but still not at baseline, will plan for HD again today with UF as tolerated.  2.  ESRD:  Dialyzes on MWF schedule, missed HD Monday due to transportation issues. HD again today, resume MWF schedule. K+ 3.3, will use high K bath.  3. Hypertension: BP currently well controlled. Continue home medications.  4.  Anemia: Hemoglobin >12, no  ESA indicated at this time.  5.  Metabolic bone disease: Calcium controlled, continue hectorol. Resume renagel, monitor phosphorus level.  6.  Nutrition:  Renal/carb modified diet, check albumin level with next labs 7. Diabetes mellitus: Management per admitting team   Anice Paganini, PA-C 08/18/2020, 8:38 AM  [facility] Pager: [phone]

## 2020-08-18 NOTE — Progress Notes (Signed)
RN received SBARR from prior nurse. Patient is off unit for hemodialysis.

## 2020-08-18 NOTE — Progress Notes (Signed)
Lab did not call RN regarding patient's critical lab Lactic acid of 2.4, or patient's critical lab value troponin 374. Janelle, charge nurse stated that Dr. Myrtie Hawk was on this unit, and was already aware of patient's critical lactic acid and critical troponin level results. Dr. Myrtie Hawk ordered an EKG for patient. Janelle performed EKG for patient. Patient is resting in bed. Patient denies chest pain. No signs of distress noted.

## 2020-08-18 NOTE — TOC Initial Note (Signed)
Transition of Care [facility]) - Initial/Assessment Note    Patient Details  Name: Gary [name] MRN: [id number] Date of Birth: [birth year]  [facility] CM/SW Contact:    Pollie Friar, RN Phone Number: 08/18/2020, 4:55 PM  Clinical Narrative:                 CM met with the patient and also talked to his SW through [facility]. Pt states he has all needed DME at home. He says he is active with [facility] but no caregivers from them and he has not been going to [facility] since covid. He states they provide transportation to HD.  CM spoke to Am[place] with [facility] and verified this information.  Current recommendations are for CIR. Colletta [place] made aware. Waiting to see how patient does with therapy after HD tomorrow.  TOC following.  Expected Discharge Plan: Home/Self Care Barriers to Discharge: Continued Medical Work up   Patient Goals and CMS Choice        Expected Discharge Plan and Services Expected Discharge Plan: Home/Self Care   Discharge Planning Services: CM Consult,Other - See comment ([facility])   Living arrangements for the past 2 months: Single Family Home                                      Prior Living Arrangements/Services Living arrangements for the past 2 months: Single Family Home Lives with:: Spouse Patient language and need for interpreter reviewed:: Yes Do you feel safe going back to the place where you live?: Yes      Need for Family Participation in Patient Care: Yes (Comment)   Current home services: DME (pt states he has all needed DME) Criminal Activity/Legal Involvement Pertinent to Current Situation/Hospitalization: No - Comment as needed  Activities of Daily Living Home Assistive Devices/Equipment: Cane (specify quad or straight),Prosthesis ADL Screening (condition at time of admission) Patient's cognitive ability adequate to safely complete daily activities?: Yes Is the patient deaf or have difficulty hearing?: No Does the patient have  difficulty seeing, even when wearing glasses/contacts?: No Does the patient have difficulty concentrating, remembering, or making decisions?: No Patient able to express need for assistance with ADLs?: Yes Does the patient have difficulty dressing or bathing?: Yes Independently performs ADLs?: No Communication: Independent Dressing (OT): Independent Is this a change from baseline?: Pre-admission baseline Grooming: Independent Is this a change from baseline?: Pre-admission baseline Feeding: Independent Bathing: Independent Toileting: Independent In/Out Bed: Independent Walks in Home: Independent (has prosthetic leg and uses cane) Is this a change from baseline?: Pre-admission baseline Does the patient have difficulty walking or climbing stairs?: Yes Weakness of Legs: Both (has prosthetic leg) Weakness of Arms/Hands: None  Permission Sought/Granted                  Emotional Assessment Appearance:: Appears stated age Attitude/Demeanor/Rapport: Engaged Affect (typically observed): Accepting Orientation: : Oriented to Self,Oriented to Place,Oriented to  Time,Oriented to Situation   Psych Involvement: No (comment)  Admission diagnosis:  COPD (chronic obstructive pulmonary disease) ([place]) [J44.9] Elevated troponin [R77.8] Acute respiratory failure with hypoxia ([place]) [J96.01] ESRD on hemodialysis ([place]) [N18.6, Z99.2] Patient Active Problem List   Diagnosis Date Noted  . Elevated troponin   . Acute respiratory failure ([place]) 08/17/2020  . Hypokalemia 10/30/2018  . Hypothyroidism, unspecified 09/25/2018  . Encounter for removal of sutures 09/11/2018  . Mild protein-calorie malnutrition ([place]) 07/10/2018  . Pain,  unspecified 06/24/2018  . Pruritus, unspecified 06/24/2018  . Shortness of breath 06/24/2018  . Acquired absence of right leg below knee ([place]) 06/18/2018  . Anemia in chronic kidney disease 06/18/2018  . Arteriovenous fistula, acquired ([place]) 06/18/2018  . Hypertensive  chronic kidney disease with stage 1 through stage 4 chronic kidney disease, or unspecified chronic kidney disease 06/18/2018  . Coagulation defect, unspecified ([place]) 06/18/2018  . ESRD on hemodialysis ([place]) 06/18/2018  . Fluid overload, unspecified 06/18/2018  . Hyperlipidemia, unspecified 06/18/2018  . Iron deficiency anemia, unspecified 06/18/2018  . Other disorders resulting from impaired renal tubular function 06/18/2018  . Patient's noncompliance with other medical treatment and regimen 06/18/2018  . Secondary hyperparathyroidism of renal origin ([place]) 06/18/2018  . Type 2 diabetes mellitus with diabetic neuropathy, unspecified ([place]) 06/18/2018  . Type 2 diabetes mellitus with other diabetic kidney complication ([place]) 22/29/7989  . Below knee amputation status 01/28/2016  . Aftercare following surgery of the circulatory system, [place] 12/01/2013  . Follow-up examination, following unspecified surgery 11/25/2012  . Atherosclerosis of native arteries of the extremities with ulceration(440.23) 11/25/2012  . 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 Pure hypercholesterolemia 09/26/2012  . Atherosclerosis of native arteries of the extremities, unspecified 09/26/2012  . Type II or unspecified type diabetes mellitus with neurological manifestations, not stated as uncontrolled(250.60) 09/26/2012  . Type II or unspecified type diabetes mellitus with ophthalmic manifestations, not stated as uncontrolled(250.50) 09/26/2012  . blurred vision due to refraction problems 09/26/2012  . poor dentition 09/26/2012  . poor dentition 09/26/2012  . Insomnia 09/26/2012  . Hypertension 09/26/2012  . Tobacco use disorder 09/26/2012  . Peripheral vascular disease 09/26/2012  . BKA right leg 09/26/2012  . Vitamin D deficiency aeb lab values of 16.5 ng/mL 09/26/2012  . Routine general medical examination at a health care facility 09/26/2012  . GERD (gastroesophageal reflux disease) 09/26/2012  .  Sensorineural hearing loss (Per ENT) 09/26/2012  . constipation 09/26/2012  . Osteomyelitis of left great toe 09/26/2012  . Toe osteomyelitis, left ([place]) 09/26/2012  . 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

## 2020-08-18 NOTE — Progress Notes (Addendum)
Saw patient at bedside. He is sleeping comfortably. His Troponin has been trended up:256>262>374.  He denies any chest pain. His SOB improved after HD. His repeat EKG shows (old) LBBB without new changes. On exam, he has warm extremities, nl S1S2, clear lungs.  His elevated trop may be demand ischemia in setting of ESRD and volume overload. He denies any chest pain and his SOB improved with HD. Lactic acid improved. Will trend Trop. If does not trend down on next check, or if he develops any new symptoms, cardiology consult will be considered. Our day team was notified and will follow.  Dewayne Hatch, MD [id number] 08/18/2020, 8:40 AM

## 2020-08-19 DIAGNOSIS — R778 Other specified abnormalities of plasma proteins: Secondary | ICD-10-CM

## 2020-08-19 DIAGNOSIS — N186 End stage renal disease: Secondary | ICD-10-CM

## 2020-08-19 DIAGNOSIS — Z992 Dependence on renal dialysis: Secondary | ICD-10-CM

## 2020-08-19 LAB — GLUCOSE, CAPILLARY
Glucose-Capillary: 116 mg/dL — ABNORMAL HIGH (ref 70–99)
Glucose-Capillary: 167 mg/dL — ABNORMAL HIGH (ref 70–99)
Glucose-Capillary: 184 mg/dL — ABNORMAL HIGH (ref 70–99)
Glucose-Capillary: 63 mg/dL — ABNORMAL LOW (ref 70–99)
Glucose-Capillary: 91 mg/dL (ref 70–99)
Glucose-Capillary: 94 mg/dL (ref 70–99)

## 2020-08-19 LAB — RENAL FUNCTION PANEL
Albumin: 2.6 g/dL — ABNORMAL LOW (ref 3.5–5.0)
Anion gap: 11 (ref 5–15)
BUN: 20 mg/dL (ref 8–23)
CO2: 27 mmol/L (ref 22–32)
Calcium: 8.9 mg/dL (ref 8.9–10.3)
Chloride: 97 mmol/L — ABNORMAL LOW (ref 98–111)
Creatinine, Ser: 5.05 mg/dL — ABNORMAL HIGH (ref 0.61–1.24)
GFR, Estimated: 12 mL/min — ABNORMAL LOW (ref >60)
Glucose, Bld: 102 mg/dL — ABNORMAL HIGH (ref 70–99)
Phosphorus: 2.9 mg/dL (ref 2.5–4.6)
Potassium: 3.9 mmol/L (ref 3.5–5.1)
Sodium: 135 mmol/L (ref 135–145)

## 2020-08-19 MED ORDER — INSULIN GLARGINE 100 UNIT/ML ~~LOC~~ SOLN: 15.0000 [IU] | Freq: Every day | SUBCUTANEOUS | Status: DC

## 2020-08-19 MED ORDER — INSULIN GLARGINE 100 UNIT/ML ~~LOC~~ SOLN
13.0000 [IU] | Freq: Every day | SUBCUTANEOUS | Status: DC
  Administered 2020-08-19 – 2020-08-23 (×5): 13 [IU] via SUBCUTANEOUS
  Filled 2020-08-19 (×6): qty 0.13

## 2020-08-19 MED ORDER — CHLORHEXIDINE GLUCONATE CLOTH 2 % EX PADS
6.0000 | MEDICATED_PAD | Freq: Every day | CUTANEOUS | Status: DC
  Administered 2020-08-19 – 2020-08-24 (×2): 6 via TOPICAL

## 2020-08-19 MED ORDER — NEPRO/CARBSTEADY PO LIQD
237.0000 mL | Freq: Two times a day (BID) | ORAL | Status: DC
  Administered 2020-08-19 – 2020-08-24 (×9): 237 mL via ORAL

## 2020-08-19 NOTE — Plan of Care (Signed)
?  Problem: Education: ?Goal: Knowledge of General Education information will improve ?Description: Including pain rating scale, medication(s)/side effects and non-pharmacologic comfort measures ?Outcome: Progressing ?  ?Problem: Health Behavior/Discharge Planning: ?Goal: Ability to manage health-related needs will improve ?Outcome: Progressing ?  ?Problem: Clinical Measurements: ?Goal: Ability to maintain clinical measurements within normal limits will improve ?Outcome: Progressing ?Goal: Will remain free from infection ?Outcome: Progressing ?Goal: Diagnostic test results will improve ?Outcome: Progressing ?Goal: Cardiovascular complication will be avoided ?Outcome: Progressing ?  ?Problem: Nutrition: ?Goal: Adequate nutrition will be maintained ?Outcome: Progressing ?  ?Problem: Coping: ?Goal: Level of anxiety will decrease ?Outcome: Progressing ?  ?Problem: Elimination: ?Goal: Will not experience complications related to bowel motility ?Outcome: Progressing ?Goal: Will not experience complications related to urinary retention ?Outcome: Progressing ?  ?Problem: Pain Managment: ?Goal: General experience of comfort will improve ?Outcome: Progressing ?  ?Problem: Safety: ?Goal: Ability to remain free from injury will improve ?Outcome: Progressing ?  ?Problem: Skin Integrity: ?Goal: Risk for impaired skin integrity will decrease ?Outcome: Progressing ?  ?

## 2020-08-19 NOTE — Progress Notes (Signed)
[facility] Progress Note   Subjective:   Seen in room, appears recommendation is now for SNF. Pt reports shortness of breath is improved, now on RA. No CP, palpitations, dizziness, abdominal pain or nausea.   Objective Vitals:   08/18/20 2150 08/18/20 2306 08/19/20 0528 08/19/20 1001  BP: 118/63 130/69 105/65 (!) 104/56  Pulse:  80 79 79  Resp: (!) 25 18  18   Temp: 98.9 F (37.2 C) 98.3 F (36.8 C) 99.1 F (37.3 C) 99.3 F (37.4 C)  TempSrc: Oral Oral Oral Oral  SpO2: 100% 99% 97% 98%  Weight:      Height:       Physical Exam  General: WDWN male, alert and in NAD Heart: RRR, no murmurs, rubs or gallops. On RA Lungs: CTA bilaterally without wheezing, rhonchi or rales Abdomen: Soft, non-tender, non-distended, +BS Extremities: No edema b/l lower extremities Dialysis Access: LUE AVF + bruit  Additional Objective Labs: Basic Metabolic Panel: Recent Labs  Lab 08/17/20 1341 08/17/20 1740 08/18/20 0043 08/19/20 0345  NA 137  --  136 135  K 4.3  --  3.3* 3.9  CL 92*  --  95* 97*  CO2 26  --  28 27  GLUCOSE 219*  --  279* 102*  BUN 59*  --  28* 20  CREATININE 11.89* 12.18* 7.33* 5.05*  CALCIUM 9.1  --  8.5* 8.9  PHOS  --   --  5.1* 2.9   Liver Function Tests: Recent Labs  Lab 08/18/20 0043 08/19/20 0345  ALBUMIN 2.5* 2.6*   CBC: Recent Labs  Lab 08/17/20 1341 08/18/20 0043  WBC 13.2* 12.2*  NEUTROABS 10.8*  --   HGB 13.6 11.1*  HCT 42.0 34.8*  MCV 83.2 82.9  PLT 246 196   Blood Culture    Component Value Date/Time   SDES BLOOD LEFT ARM 01/27/2008 0630   SPECREQUEST BOTTLES DRAWN AEROBIC AND ANAEROBIC 10CC EA 01/27/2008 0630   CULT NO GROWTH 5 DAYS 01/27/2008 0630   REPTSTATUS 02/02/2008 FINAL 01/27/2008 0630   CBG: Recent Labs  Lab 08/18/20 1122 08/18/20 1813 08/19/20 0014 08/19/20 0853 08/19/20 0945  GLUCAP 184* 181* 116* 63* 94    Studies/Results: CT Angio Chest PE W and/or Wo Contrast  Result Date:  08/17/2020 CLINICAL DATA:  Shortness of breath. EXAM: CT ANGIOGRAPHY CHEST WITH CONTRAST TECHNIQUE: Multidetector CT imaging of the chest was performed using the standard protocol during bolus administration of intravenous contrast. Multiplanar CT image reconstructions and MIPs were obtained to evaluate the vascular anatomy. CONTRAST:  110mL OMNIPAQUE IOHEXOL 350 MG/ML SOLN COMPARISON:  None. FINDINGS: Cardiovascular: The heart is enlarged. No pericardial effusion. Significant age advanced atherosclerotic calcification involving the thoracic aorta, branch vessels and in particular the coronary arteries. There is significant reflux of contrast down the IVC and into the hepatic veins suggesting tricuspid regurgitation. The pulmonary arterial tree is well opacified. No filling defects to suggest pulmonary embolism. Mediastinum/Nodes: Mediastinal and hilar lymphadenopathy. The esophagus is grossly normal. Lungs/Pleura: Moderate peribronchial thickening most notably in the lower lung zones bilaterally suggesting bronchitis. Patchy mosaic pattern of ground-glass attenuation in the lungs. This can be seen with reactive airways disease/asthma respiratory bronchiolitis, cryptogenic organizing pneumonia or hypersensitivity pneumonitis. Atypical/viral pneumonia would be another possibility. No focal airspace consolidation/typical pneumonia. No pleural effusions. Upper Abdomen: No acute upper abdominal findings. Advanced vascular calcifications likely related to the patient's diabetes. Musculoskeletal: No significant bony findings. Review of the MIP images confirms the above findings. IMPRESSION: 1. No CT  findings for pulmonary embolism. 2. Significant age advanced atherosclerotic calcification involving the thoracic aorta, branch vessels and in particular the coronary arteries. 3. Significant reflux of contrast down the IVC and into the hepatic veins suggesting tricuspid regurgitation. 4. Moderate peribronchial thickening most  notably in the lower lung zones bilaterally suggesting bronchitis. 5. Patchy mosaic pattern of ground-glass attenuation in the lungs. This can be seen with reactive airways disease/asthma respiratory bronchiolitis, cryptogenic organizing pneumonia or hypersensitivity pneumonitis. Atypical/viral pneumonia would be another possibility. 6. Mediastinal and hilar lymphadenopathy. Patient may need further evaluation with PET-CT. 7. Aortic atherosclerosis. Aortic Atherosclerosis (ICD10-I70.0). Electronically Signed   By: Marijo Sanes M.D.   On: 08/17/2020 17:18   DG Chest Port 1 View  Result Date: 08/17/2020 CLINICAL DATA:  Shortness of breath. EXAM: PORTABLE CHEST 1 VIEW COMPARISON:  June 12, 2018. FINDINGS: Stable cardiomediastinal silhouette. No pneumothorax or pleural effusion is noted. Lungs are clear. Bony thorax is unremarkable. IMPRESSION: No active disease. Aortic Atherosclerosis (ICD10-I70.0). Electronically Signed   By: Marijo Conception M.D.   On: 08/17/2020 14:35   Medications:  . amLODipine  10 mg Oral Daily  . aspirin EC  81 mg Oral Daily  . Chlorhexidine Gluconate Cloth  6 each Topical Q0600  . Chlorhexidine Gluconate Cloth  6 each Topical Q0600  . doxercalciferol  6 mcg Intravenous Q M,W,F-HD  . furosemide  80 mg Oral Daily  . heparin  5,000 Units Subcutaneous Q8H  . hydrALAZINE  100 mg Oral TID  . insulin aspart  0-9 Units Subcutaneous TID WC  . insulin glargine  20 Units Subcutaneous QHS  . metoprolol succinate  100 mg Oral Daily  . nicotine  21 mg Transdermal Daily  . pantoprazole  40 mg Oral Daily  . rosuvastatin  20 mg Oral Daily  . sevelamer carbonate  3,200 mg Oral TID WC    [facility], 3:45 hours, BFR 400, DFR 800, EDW 59kg, 2K/2Ca, AVF 15g, heparin 2200 unit bolus Hectorol 6 mcg IV q HD  Assessment/Plan: 1. Hypoxia: O2 sat 79% on RA, improved on [place]. CXR not showing active disease on admission but he did  have significantly elevated BNP and rhonchi on exam. Most likely volume overload due to missed HD. Symptoms improved s/p serial HD, now below his outpatient EDW.  2. ESRD:Dialyzes on MWF schedule, missed HD Monday due to transportation issues. Next HD Friday, 4/22. 3. Hypertension: BP currently well controlled. Continue home medications. 4. Anemia:Hemoglobin at goal, no ESA indicated at this time.  5. Metabolic bone disease:Calcium controlled, continue hectorol. Phos controlled, continue sevelamer.  6. Nutrition:Renal/carb modified diet. Albumin low, will start protein supplement.  7. Diabetes mellitus: Management per admitting team   Anice Paganini, PA-C 08/19/2020, 10:59 AM  [facility] Pager: [phone]

## 2020-08-19 NOTE — Progress Notes (Signed)
Pharmacist Heart Failure Core Measure Documentation  Assessment: Gary [name] has an EF documented as 25-30% .  Rationale: Heart failure patients with left ventricular systolic dysfunction (LVSD) and an EF < 40% should be prescribed an angiotensin converting enzyme inhibitor (ACEI) or angiotensin receptor blocker (ARB) at discharge unless a contraindication is documented in the medical record.  This patient is not currently on an ACEI or ARB for HF.  This note is being placed in the record in order to provide documentation that a contraindication to the use of these agents is present for this encounter.  ACE Inhibitor or Angiotensin Receptor Blocker is contraindicated (specify all that apply)  []   ACEI allergy AND ARB allergy []   Angioedema []   Moderate or severe aortic stenosis []   Hyperkalemia []   Hypotension []   Renal artery stenosis [x]   Worsening renal function, preexisting renal disease or dysfunction. ESRD on HD   Nicole Cella, [place] Clinical Pharmacist  08/19/2020 4:02 PM

## 2020-08-19 NOTE — Progress Notes (Signed)
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 He is presenting for acute dyspnea.  History was obtained from the patient and from PCP at [facility]    PT Comments    Pt received in bed, feeling a little nauseous but willing to participate in PT. Generally supervision to min A for bed mobility. Upon sitting at EOB, pt tremulous and reporting increased nausea/dizziness. Found to be hypertensive. Pt returned to supine with BP dropping. Pt left in bed with all needs met, call bell within reach, and bed alarm active. RN aware of status and BP. Will continue to follow acutely and progress as able.  BP sitting at 0 min = 165/125, pulse = 90  BP return to supine at 0 min = 79/60, pulse = 74    Follow Up Recommendations  CIR;Supervision for mobility/OOB     Equipment Recommendations  Rolling walker with 5" wheels;Wheelchair (measurements PT);3in1 (PT);Wheelchair cushion (measurements PT)    Recommendations for Other Services       Precautions / Restrictions Precautions Precautions: Fall;Other (comment) Precaution Comments: hx of R BKA with prosthetic in room Restrictions Weight Bearing Restrictions: No    Mobility  Bed Mobility Overal bed mobility: Needs Assistance Bed Mobility: Rolling;Sidelying to Sit;Sit to Supine Rolling: Supervision Sidelying to sit: Min assist;HOB elevated   Sit to supine: Min assist   General bed mobility comments: Use of bed rail, min A to bring trunk upright, min A to guide trunk and LEs back into bed    Transfers                 General transfer comment: deferred  Ambulation/Gait                  Stairs             Wheelchair Mobility    Modified Rankin (Stroke Patients Only)       Balance Overall balance assessment: Needs assistance Sitting-balance support: Bilateral upper extremity supported;Feet supported Sitting balance-Leahy Scale: Poor Sitting balance - Comments: reliant on at least 1 UE supported in sitting   Standing balance support: Bilateral upper extremity supported;During functional activity Standing balance-Leahy Scale: Poor Standing balance comment: reliant on UE support and external support                            Cognition Arousal/Alertness: Awake/alert Behavior During Therapy: WFL for tasks assessed/performed Overall Cognitive Status: Impaired/Different from baseline Area of Impairment: Following commands;Awareness                       Following Commands: Follows one step commands with increased time;Follows multi-step commands with increased time   Awareness: Emergent          General Comments        Pertinent Vitals/Pain Pain Assessment: Faces Faces Pain Scale: Hurts even more Pain Location: stomach Pain Descriptors / Indicators: Discomfort;Aching;Grimacing Pain Intervention(s): Monitored during session     PT Goals (current goals can now be found in the care plan section) Acute Rehab PT Goals Patient Stated Goal: feel better, get strength back PT Goal Formulation:  With patient Time For Goal Achievement: 09/01/20 Potential to Achieve Goals: Good    Frequency    Min 3X/week      PT Plan Current plan remains appropriate    Co-evaluation              AM-PAC PT "6 Clicks" Mobility   Outcome Measure  Help needed turning from your back to your side while in a flat bed without using bedrails?: A Little Help needed moving from lying on your back to sitting on the side of a flat bed without using bedrails?: A Lot Help needed moving to and from a bed to a chair (including a  wheelchair)?: A Lot Help needed standing up from a chair using your arms (e.g., wheelchair or bedside chair)?: A Lot Help needed to walk in hospital room?: Total Help needed climbing 3-5 steps with a railing? : Total 6 Click Score: 11    End of Session   Activity Tolerance: Other (comment) (Limited by BP and reports of increased dizziness/nausea) Patient left: in bed;with call bell/phone within reach;with bed alarm set Nurse Communication: Mobility status;Need for lift equipment PT Visit Diagnosis: Unsteadiness on feet (R26.81);Difficulty in walking, not elsewhere classified (R26.2);Muscle weakness (generalized) (M62.81)     Time:  -     Charges:                       Rosita Kea, SPT

## 2020-08-19 NOTE — Progress Notes (Signed)
  Date: 08/19/2020  Patient name: Gary [name]  Medical record number: [id number]  Date of birth: January 10, 1956        I have seen and evaluated this patient and I have discussed the plan of care with the house staff. Please see Dr. Chase Picket note for complete details. I concur with her findings and plan.   Sid Falcon, MD 08/19/2020, 4:59 PM

## 2020-08-19 NOTE — Progress Notes (Signed)
Inpatient Diabetes Program Recommendations  AACE/ADA: New Consensus Statement on Inpatient Glycemic Control (2015)  Target Ranges:  Prepandial:   less than 140 mg/dL      Peak postprandial:   less than 180 mg/dL (1-2 hours)      Critically ill patients:  140 - 180 mg/dL   Lab Results  Component Value Date   GLUCAP 94 08/19/2020   HGBA1C 13.3 (H) 01/18/2010    Review of Glycemic Control Results for WALLER, MARCUSSEN (MRN [id number]) as of 08/19/2020 10:38  Ref. Range 08/18/2020 07:19 08/18/2020 11:22 08/18/2020 18:13 08/19/2020 00:14 08/19/2020 08:53  Glucose-Capillary Latest Ref Range: 70 - 99 mg/dL 108 (H) 184 (H) 181 (H) 116 (H) 63 (L)   Diabetes history:  DM2 Outpatient Diabetes medications:  Trulicity 9.89 weekly Current orders for Inpatient glycemic control:  Lantus 20 units QHS, Novolog 0-9 units TID  Inpatient Diabetes Program Recommendations:    Lantus 12 units QHS  Will continue to follow while inpatient.  Thank you, Reche Dixon, RN, BSN Diabetes Coordinator Inpatient Diabetes Program [phone] (team pager from 8a-5p)

## 2020-08-19 NOTE — Progress Notes (Signed)
   Subjective/Interm history: Seen by the rehab coordinator yesterday afternoon who commented that they wanted to see how he was doing with therapy in the upcoming days after further HD.  He was talking on the phone this morning so I just briefly explained that we are awaiting further PT recommendations and working on disposition. No complaints  Objective:  Vital signs in last 24 hours: Vitals:   08/18/20 2150 08/18/20 2306 08/19/20 0528 08/19/20 1001  BP: 118/63 130/69 105/65 (!) 104/56  Pulse:  80 79 79  Resp: (!) 25 18  18   Temp: 98.9 F (37.2 C) 98.3 F (36.8 C) 99.1 F (37.3 C) 99.3 F (37.4 C)  TempSrc: Oral Oral Oral Oral  SpO2: 100% 99% 97% 98%  Weight:      Height:       General: chronically ill appearing but in NAD  Cardiac: extremities warm. No peripheral edema Pulm: breathing comfortably on room air with bed flat. No gross rhonchi or wheezing  Assessment/Plan:  Principal Problem:   Acute respiratory failure (HCC) Active Problems:   ESRD on hemodialysis ([place])   Elevated troponin  65 year old chronically ill male with ESRD on HD, CHF, +/- COPD who was admitted for acute respiratory failure secondary to volume overload in the setting of missed HD and quickly resolved with undergoing dialysis.  He was evaluated by PT/OT who recommended CIR for ongoing rehabilitation. He is now awaiting evaluation and insurance authorization with them.   Physical deconditioning -evaluated by CIR admission coordinator 4/20 who wanted to re-evaluate after more HD and additional therapy sessions -nephrology now notes that he is below his EDW so this should not be a barrier to decision.  -appreciate PT/OT recommendations.  -will reach out to [facility]ACE provider to update   #Type 2 NSTEMI. trops now flat. No chest pain. No further workup at this time. Will need cardiology referral after discharge.  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 euvolumic on exam  #ESRD on HD. Now  below EDW. Management per nephrology.  Insulin dependent type 2 diabetes. Fasting glucose 68 this morning.  -decrease lantus to 13U. SSI  #Mediastinal and hilar adenopathy as noted on chest CT from admission #Tobacco use disorder--50 pack year smoking history -PET scan to be arranged after discharge based on CT report recommendations  # Peripheral arterial disease with chronic left 2nd toe wound. Follows with podiatry. No sign of infection at this time. Continue antiplatelet therapy with aspirin. # Chronic hypertension, hyperlipidemia, atherosclerotic disease.  -Continue home amlodipine, hydralazine, metoprolol, Asprin, crestor  #Acute hypoxic respiratory failure (resolved) secondary to volume overload in the setting of missed HD session and CHF.    Best Practice: CODE: FULL Dispo: CIR vs SNF  Mitzi Hansen, MD Internal Medicine Resident PGY-2 [facility] Pager: [phone] 08/19/2020 11:57 AM    After 5pm on weekdays and 1pm on weekends: On Call pager [phone]

## 2020-08-20 LAB — RENAL FUNCTION PANEL
Albumin: 2.6 g/dL — ABNORMAL LOW (ref 3.5–5.0)
Anion gap: 12 (ref 5–15)
BUN: 35 mg/dL — ABNORMAL HIGH (ref 8–23)
CO2: 29 mmol/L (ref 22–32)
Calcium: 9.3 mg/dL (ref 8.9–10.3)
Chloride: 95 mmol/L — ABNORMAL LOW (ref 98–111)
Creatinine, Ser: 7.24 mg/dL — ABNORMAL HIGH (ref 0.61–1.24)
GFR, Estimated: 8 mL/min — ABNORMAL LOW (ref >60)
Glucose, Bld: 113 mg/dL — ABNORMAL HIGH (ref 70–99)
Phosphorus: 3.6 mg/dL (ref 2.5–4.6)
Potassium: 4.1 mmol/L (ref 3.5–5.1)
Sodium: 136 mmol/L (ref 135–145)

## 2020-08-20 LAB — CBC
HCT: 35.6 % — ABNORMAL LOW (ref 39.0–52.0)
Hemoglobin: 11.2 g/dL — ABNORMAL LOW (ref 13.0–17.0)
MCH: 26.3 pg (ref 26.0–34.0)
MCHC: 31.5 g/dL (ref 30.0–36.0)
MCV: 83.6 fL (ref 80.0–100.0)
Platelets: 276 10*3/uL (ref 150–400)
RBC: 4.26 MIL/uL (ref 4.22–5.81)
RDW: 13.9 % (ref 11.5–15.5)
WBC: 13.2 10*3/uL — ABNORMAL HIGH (ref 4.0–10.5)
nRBC: 0 % (ref 0.0–0.2)

## 2020-08-20 LAB — GLUCOSE, CAPILLARY
Glucose-Capillary: 112 mg/dL — ABNORMAL HIGH (ref 70–99)
Glucose-Capillary: 203 mg/dL — ABNORMAL HIGH (ref 70–99)
Glucose-Capillary: 308 mg/dL — ABNORMAL HIGH (ref 70–99)
Glucose-Capillary: 101 mg/dL — ABNORMAL HIGH (ref 70–99)

## 2020-08-20 MED ORDER — DOXERCALCIFEROL 4 MCG/2ML IV SOLN
3.0000 ug | INTRAVENOUS | Status: DC
  Filled 2020-08-20: qty 2

## 2020-08-20 MED ORDER — RENA-VITE PO TABS
1.0000 | ORAL_TABLET | Freq: Every day | ORAL | Status: DC
  Administered 2020-08-20 – 2020-08-23 (×4): 1 via ORAL
  Filled 2020-08-20 (×4): qty 1

## 2020-08-20 MED ORDER — DOXERCALCIFEROL 4 MCG/2ML IV SOLN
INTRAVENOUS | Status: AC
  Administered 2020-08-20: 3 ug via INTRAVENOUS
  Filled 2020-08-20: qty 2

## 2020-08-20 NOTE — Progress Notes (Signed)
[facility] Progress Note   Subjective:   Seen on HD, reports he was slightly dizzy and nauseous this AM but feeling better. Notes poor appetite and weakness lately but ate some of his breakfast this AM. Slightly SOB again today. No chest pain or palpitations.   Objective Vitals:   08/19/20 1001 08/19/20 1829 08/19/20 2047 08/20/20 0509  BP: (!) 104/56 (!) 104/52 (!) 93/51 99/62  Pulse: 79 71 75 72  Resp: 18 18 18 18   Temp: 99.3 F (37.4 C) 98.3 F (36.8 C) 99.4 F (37.4 C) 99.5 F (37.5 C)  TempSrc: Oral Oral Oral Oral  SpO2: 98% 100% 91%   Weight:      Height:       Physical Exam General:WDWN male, alert and in NAD Heart:RRR, no murmurs, rubs or gallops. On O2 2L via [place] Lungs:CTA bilaterally without wheezing, rhonchi or rales Abdomen:Soft, non-tender, non-distended, +BS Extremities:No edema b/l lower extremities Dialysis Access:LUE AVF accessed on HD  Additional Objective Labs: Basic Metabolic Panel: Recent Labs  Lab 08/18/20 0043 08/19/20 0345 08/20/20 0234  NA 136 135 136  K 3.3* 3.9 4.1  CL 95* 97* 95*  CO2 28 27 29   GLUCOSE 279* 102* 113*  BUN 28* 20 35*  CREATININE 7.33* 5.05* 7.24*  CALCIUM 8.5* 8.9 9.3  PHOS 5.1* 2.9 3.6   Liver Function Tests: Recent Labs  Lab 08/18/20 0043 08/19/20 0345 08/20/20 0234  ALBUMIN 2.5* 2.6* 2.6*   CBC: Recent Labs  Lab 08/17/20 1341 08/18/20 0043  WBC 13.2* 12.2*  NEUTROABS 10.8*  --   HGB 13.6 11.1*  HCT 42.0 34.8*  MCV 83.2 82.9  PLT 246 196   Blood Culture    Component Value Date/Time   SDES BLOOD LEFT ARM 01/27/2008 0630   SPECREQUEST BOTTLES DRAWN AEROBIC AND ANAEROBIC 10CC EA 01/27/2008 0630   CULT NO GROWTH 5 DAYS 01/27/2008 0630   REPTSTATUS 02/02/2008 FINAL 01/27/2008 0630   CBG: Recent Labs  Lab 08/19/20 0945 08/19/20 1314 08/19/20 1827 08/19/20 2048 08/20/20 0650  GLUCAP 94 91 184* 167* 112*   Medications:  . amLODipine  10 mg Oral Daily  . aspirin EC  81  mg Oral Daily  . Chlorhexidine Gluconate Cloth  6 each Topical Q0600  . Chlorhexidine Gluconate Cloth  6 each Topical Q0600  . Chlorhexidine Gluconate Cloth  6 each Topical Q0600  . doxercalciferol  6 mcg Intravenous Q M,W,F-HD  . feeding supplement (NEPRO CARB STEADY)  237 mL Oral BID BM  . furosemide  80 mg Oral Daily  . heparin  5,000 Units Subcutaneous Q8H  . hydrALAZINE  100 mg Oral TID  . insulin aspart  0-9 Units Subcutaneous TID WC  . insulin glargine  13 Units Subcutaneous QHS  . metoprolol succinate  100 mg Oral Daily  . nicotine  21 mg Transdermal Daily  . pantoprazole  40 mg Oral Daily  . rosuvastatin  20 mg Oral Daily  . sevelamer carbonate  3,200 mg Oral TID WC    [facility], 3:45 hours, BFR 400, DFR 800, EDW 59kg, 2K/2Ca, AVF 15g, heparin 2200 unit bolus Hectorol 6 mcg IV q HD  Assessment/Plan: 1. Hypoxia: O2 sat 79% on RA on admit, improved on . CXR not showing active diseaseon admissionbut he did have significantly elevated BNP and rhonchi on exam. Most likely volume overload due to missed HD. Symptoms improved s/p serial HD, now below his outpatient EDW. Will need to be  adjusted at discharge.  2. ESRD:Dialyzes on MWF schedule, missed HDMondaydue to transportation issues. Continue MWF schedule 3. Hypertension: BP currently well controlled/slightly soft. Continue home medications (though patient is not sure if he was taking all 3 meds before admission), if BP remains soft reduce hydralazine dose.  4. Anemia:Hemoglobin at goal, no ESA indicated at this time.  5. Metabolic bone disease:Calcium elevated to 10.4 this AM, will reduce hectorol dose. Phos controlled, continue sevelamer.  6. Nutrition:Renal/carb modified diet. Albumin low, will start protein supplement.  7. Diabetes mellitus: Management per admitting team   Anice Paganini, PA-C 08/20/2020, 8:30 AM  [facility] Pager: [phone]

## 2020-08-20 NOTE — Progress Notes (Signed)
Initial Nutrition Assessment  DOCUMENTATION CODES:   Non-severe (moderate) malnutrition in context of chronic illness  INTERVENTION:   -Continue Nepro Shake po BID, each supplement provides 425 kcal and 19 grams protein -Renal MVI daily  NUTRITION DIAGNOSIS:   Moderate Malnutrition related to chronic illness (ESRD on HD) as evidenced by mild fat depletion,moderate fat depletion,mild muscle depletion,moderate muscle depletion.  GOAL:   Patient will meet greater than or equal to 90% of their needs  MONITOR:   PO intake,Supplement acceptance,Diet advancement,Labs,Weight trends,Skin,I & O's  REASON FOR ASSESSMENT:   Consult Assessment of nutrition requirement/status  ASSESSMENT:   Gary [name] is a 65 year old chronically ill male with ESRD on HD MWF, combined heart failure (recently diagnosed), COPD (per [facility]ACE provider), tobacco use disorder, hypertension, hyperlipidemia, uncontrolled diabetes, and PVD s/p right BKA and left first toe. He is presenting for acute dyspnea.  Pt admitted with acute dyspnea related to missed HD.   Reviewed I/O's: +720 ml x 24 hours and -2.5 L since admission  Spoke with pt at bedside, who was pleasant and in good spirits today. He reports he is very hungry after finishing HD treatment. PTA he reports poor appetite; he typically eats 3 meals per day, but eats less than usual (pt unable to provide accurate diet recall other than his wife prepares food for him). Pt replied "I don't know"to most questions this RD asked".   Pt endorses wt loss, but unsure of UBW or dry weight. Reviewed wt hx; pt has experienced a 9.3% wt loss over the past 7 months.  Discussed importance of good meal and supplement intake to promote healing. Pt like Butter Pecan Nepro and estimates he consumes these at least once daily PTA.  Medications reviewed and include renvela.   Lab Results  Component Value Date   HGBA1C 13.3 (H) 01/18/2010   PTA DM medications are 7.5 mg  truclicty weekly.   Labs reviewed: CBGS: 112-203 (inpatient orders for glycemic control are 0-9 units insulin aspart TID with meals and 13 units insulin glarigne daily).   NUTRITION - FOCUSED PHYSICAL EXAM:  Flowsheet Row Most Recent Value  Orbital Region Moderate depletion  Upper Arm Region Moderate depletion  Thoracic and Lumbar Region Mild depletion  Buccal Region Mild depletion  Temple Region Moderate depletion  Clavicle Bone Region Moderate depletion  Clavicle and Acromion Bone Region Moderate depletion  Scapular Bone Region Moderate depletion  Dorsal Hand Severe depletion  Patellar Region Moderate depletion  Anterior Thigh Region Moderate depletion  Posterior Calf Region Moderate depletion  Edema (RD Assessment) None  Hair Reviewed  Eyes Reviewed  Mouth Reviewed  Skin Reviewed  Nails Reviewed       Diet Order:   Diet Order            Diet renal with fluid restriction Fluid restriction: 1200 mL Fluid; Room service appropriate? Yes; Fluid consistency: Thin  Diet effective now                 EDUCATION NEEDS:   Education needs have been addressed  Skin:  Skin Assessment: Skin Integrity Issues: Skin Integrity Issues:: Other (Comment) Other: open wound on lt lateral second toe  Last BM:  08/18/20  Height:   Ht Readings from Last 1 Encounters:  08/17/20 5\' 7"  (1.702 m)    Weight:   Wt Readings from Last 1 Encounters:  08/20/20 53.5 kg    Ideal Body Weight:  62.9 kg (adjusted for lt BKA)  BMI:  Body  mass index is 18.47 kg/m.  Estimated Nutritional Needs:   Kcal:  1700-1900  Protein:  90-105 grams  Fluid:  1000 ml + UOP    Loistine Chance, RD, LDN, [place] Registered Dietitian II Certified Diabetes Care and Education Specialist Please refer to [facility] for RD and/or RD on-call/weekend/after hours pager

## 2020-08-20 NOTE — TOC Progression Note (Addendum)
Transition of Care [facility]) - Progression Note    Patient Details  Name: SHAYON TROMPETER MRN: [id number] Date of Birth: November 18, 1955  Transition of [facility]) CM/SW Contact  Pollie Friar, RN Phone Number: 08/20/2020, 12:53 PM  Clinical Narrative:    CM spoke to Gail with [facility]. She states they have reviewed pts hospital stay and are recommending home with therapy through [facility]. CM inquired about assistance for his wife at home and [facility] [place] states they can arrange that. CM has updated the pts spouse and she is hesitant and wants to see if he does better before returning home. MD also reaching out to [facility] d/t pts weakness.  TOC following.  [place]: Heard back from [place] at [facility] that they would like patient evaluated for CIR. Cm has updated MD and CIR. If he is not a candidate then they are agreeable to SNF: [facility] or [facility].   Expected Discharge Plan: Home/Self Care Barriers to Discharge: Continued Medical Work up  Expected Discharge Plan and Services Expected Discharge Plan: Home/Self Care   Discharge Planning Services: CM Consult,Other - See comment ([facility])   Living arrangements for the past 2 months: Single Family Home                                       Social Determinants of Health (SDOH) Interventions    Readmission Risk Interventions No flowsheet data found.

## 2020-08-20 NOTE — Progress Notes (Signed)
OT Cancellation Note  Patient Details Name: Gary [name] MRN: [id number] DOB: [birth year]   Cancelled Treatment:    Reason Eval/Treat Not Completed: Patient at procedure or test/ unavailable Pt off unit for HD this AM. Will follow-up for OT session as schedule permits.  Layla Maw 08/20/2020, 10:52 AM

## 2020-08-20 NOTE — Progress Notes (Signed)
   Subjective/Interm history: No complaints this morning. Says he feels better than yesterday. Resting comfortably in bed. Discussed that we are still working on discharge planning.   Objective:  Vital signs in last 24 hours: Vitals:   08/19/20 1001 08/19/20 1829 08/19/20 2047 08/20/20 0509  BP: (!) 104/56 (!) 104/52 (!) 93/51 99/62  Pulse: 79 71 75 72  Resp: 18 18 18 18   Temp: 99.3 F (37.4 C) 98.3 F (36.8 C) 99.4 F (37.4 C) 99.5 F (37.5 C)  TempSrc: Oral Oral Oral Oral  SpO2: 98% 100% 91%   Weight:      Height:       General: chronically ill appearing but in NAD  Cardiac: RRR, no LE edema Pulm: breathing comfortably on room air. Lungs clear anteriorly.  MSK: diffuse muscle weakness. Unable to sit up, even with assistance.   Assessment/Plan:  Principal Problem:   Acute respiratory failure (HCC) Active Problems:   ESRD on hemodialysis ([place])   Elevated troponin  65 year old chronically ill male with ESRD on HD, CHF, +/- COPD who was admitted for acute respiratory failure secondary to volume overload in the setting of missed HD and quickly resolved with undergoing dialysis.  He was evaluated by PT/OT who recommended CIR for ongoing rehabilitation. He is now awaiting evaluation and insurance authorization with them.   Physical deconditioning CIR reached out to [facility] today who was ambivilent about sending him and wanted to consider sending him home with PT at [facility]. I reviewed PT/OT notes which have continued to recommend CIR and based on my exam today, I did not feel that this was a safe plan. Pt remains amenable to going for acute therapy at CIR or SNF so I reached out and was able to have a conference call with his team at [facility] including MD, PT, SW who questioned whether or not he would be able to participate with CIR due to his weakness. After further discussion, however, they are now amenable to sending him to CIR if needed. Plan -plan will be to get re-evaluated by CIR.  If not thought to be a candidate by CIR, then will work on SNF for acute rehab - continue PT/OT while awaiting placement  #Insulin dependent type 2 diabetes. Fasting glucose 113this morning.  -continue lantus 13U daily with SSI  #Combined heart failure (EF 25-30%), chronic LBBB. euvolumic on exam #Peripheral arterial disease with chronic left 2nd toe wound. Follows with podiatry.  -Continue antiplatelet therapy with aspirin. #Chronic hypertension, hyperlipidemia, atherosclerotic disease. PT noted orthostasis after HD today. Dr. [name] also noted that pt is known to be non-compliant with his medications at home so orthostasis may be related to this. -d/c hydralazine  -continue home amlodipine, metoprolol, Asprin, crestor  #ESRD on HD. Now below EDW. Management per nephrology.  #Mediastinal and hilar adenopathy as noted on chest CT from admission #Tobacco use disorder--50 pack year smoking history -PET scan to be arranged after discharge based on CT report recommendations  #Acute hypoxic respiratory failure (resolved) secondary to volume overload in the setting of missed HD session and CHF.    Best Practice: CODE: FULL VTE prophylaxis: heparin 5000U q8h After 5pm on weekdays and 1pm on weekends: On Call pager [phone] Dispo: CIR vs SNF. Medically stable for discharge  Mitzi Hansen, MD Internal Medicine Resident PGY-2 [facility] Pager: [phone] 08/20/2020 8:20 AM

## 2020-08-20 NOTE — Progress Notes (Signed)
Physical Therapy Treatment Patient Details Name: Gary [name] MRN: [id number] DOB: 05-13-1955 Today's Date: 08/20/2020    History of Present Illness Gary [name] is a 65 year old chronically ill male with ESRD on HD MWF, combined heart failure (recently diagnosed), COPD (per [facility] provider), tobacco use disorder, hypertension, hyperlipidemia, uncontrolled diabetes, and PVD s/p right BKA and left first toe. He is presenting for acute dyspnea.  History was obtained from the patient and from PCP at [facility]    PT Comments    Pt received in bed, very cooperative and pleasant and willing to participate in PT. Progressing with mobility as pt needed less assistance for bed mobility and to stand. However upon standing, pt very tremulous and needed up to mod A for steadying. Knees began to buckle in standing. Per dinamap monitor, HR 220 but difficulty getting signal. Pt returned to sitting and manual HR found to be at least 140 BPM, however difficult reading due to tremors. Pt denied dizziness throughout session but BP low. SPO2 > 90% on RA. Pt left in bed with all needs met, call bell within reach, bed alarm active, and RN & physician aware of status. Will continue to follow acutely.  BP supine = 93/65, HR 78 BP sitting = 81/64, HR = 77 Standing at 0 min = 85/70, HR = 220 ? (Manual return to sit = 140)    Follow Up Recommendations  CIR;Supervision for mobility/OOB     Equipment Recommendations  Rolling walker with 5" wheels;Wheelchair (measurements PT);3in1 (PT);Wheelchair cushion (measurements PT)    Recommendations for Other Services       Precautions / Restrictions Precautions Precautions: Fall;Other (comment) Precaution Comments: hx of R BKA with prosthetic in room, monitor BP & HR Restrictions Weight Bearing Restrictions: No    Mobility  Bed Mobility Overal bed mobility: Needs Assistance Bed Mobility: Supine to Sit;Sit to Supine     Supine to sit: Min guard;HOB elevated Sit to  supine: Min guard   General bed mobility comments: Min guard for safety due to shakiness, no physical assist given    Transfers Overall transfer level: Needs assistance Equipment used: Rolling walker (2 wheeled) Transfers: Sit to/from Stand Sit to Stand: Min assist;+2 physical assistance;+2 safety/equipment         General transfer comment: Very tremulous, min Ax2 for boost into standing and for steadying upon standing  Ambulation/Gait             General Gait Details: unable- fatigue and weakness   Stairs             Wheelchair Mobility    Modified Rankin (Stroke Patients Only)       Balance Overall balance assessment: Needs assistance Sitting-balance support: Bilateral upper extremity supported;Feet supported Sitting balance-Leahy Scale: Fair Sitting balance - Comments: fair sitting balance, able to don prostethetic sitting at EOB with min guard for safety due to tremors   Standing balance support: Bilateral upper extremity supported;During functional activity Standing balance-Leahy Scale: Poor Standing balance comment: reliant on UE support and external support                            Cognition Arousal/Alertness: Awake/alert Behavior During Therapy: WFL for tasks assessed/performed Overall Cognitive Status: Impaired/Different from baseline Area of Impairment: Following commands;Awareness                       Following Commands: Follows one step commands with  increased time;Follows multi-step commands with increased time   Awareness: Emergent   General Comments: Very pleasant and participatory, shows insight into considerable weakness.      Exercises      General Comments      Pertinent Vitals/Pain Pain Assessment: No/denies pain Pain Intervention(s): Monitored during session    Home Living                      Prior Function            PT Goals (current goals can now be found in the care plan  section) Acute Rehab PT Goals Patient Stated Goal: feel better, get strength back PT Goal Formulation: With patient Time For Goal Achievement: 09/01/20 Potential to Achieve Goals: Good Progress towards PT goals: Progressing toward goals    Frequency    Min 3X/week      PT Plan Current plan remains appropriate    Co-evaluation PT/OT/SLP Co-Evaluation/Treatment: Yes Reason for Co-Treatment: For patient/therapist safety;To address functional/ADL transfers PT goals addressed during session: Balance;Mobility/safety with mobility;Proper use of DME OT goals addressed during session: ADL's and self-care      AM-PAC PT "6 Clicks" Mobility   Outcome Measure  Help needed turning from your back to your side while in a flat bed without using bedrails?: A Little Help needed moving from lying on your back to sitting on the side of a flat bed without using bedrails?: A Little Help needed moving to and from a bed to a chair (including a wheelchair)?: A Lot Help needed standing up from a chair using your arms (e.g., wheelchair or bedside chair)?: A Lot Help needed to walk in hospital room?: Total Help needed climbing 3-5 steps with a railing? : Total 6 Click Score: 12    End of Session Equipment Utilized During Treatment: Gait belt Activity Tolerance: Other (comment);Patient limited by fatigue (Limited by HR, tremors, and fatigue) Patient left: in bed;with call bell/phone within reach;with bed alarm set Nurse Communication: Mobility status;Need for lift equipment PT Visit Diagnosis: Unsteadiness on feet (R26.81);Difficulty in walking, not elsewhere classified (R26.2);Muscle weakness (generalized) (M62.81)     Time:  -     Charges:                        Rosita Kea, SPT

## 2020-08-20 NOTE — Plan of Care (Signed)
  Problem: Education: Goal: Knowledge of General Education information will improve Description: Including pain rating scale, medication(s)/side effects and non-pharmacologic comfort measures Outcome: Progressing   Problem: Clinical Measurements: Goal: Ability to maintain clinical measurements within normal limits will improve Outcome: Progressing   

## 2020-08-20 NOTE — Progress Notes (Signed)
Attempted to call [facility] to speak to Jeven's primary PT (Amy)  at [facility] per MD request; able to reach therapy dept but Amy was not available at this time and I left call back number.   Windell Norfolk, DPT, PN1   Supplemental Physical Therapist [facility]    Pager [phone] Acute Rehab Office [phone]

## 2020-08-20 NOTE — Care Management Important Message (Signed)
Important Message  Patient Details  Name: Gary [name] MRN: [id number] Date of Birth: 1956-04-05   Medicare Important Message Given:  Yes - Important Message mailed due to current National Emergency  Verbal consent obtained due to current National Emergency  Relationship to patient: Self Contact Name: Jekhi Call Date: 08/20/20  Time: 1357 Phone: [phone] Outcome: Spoke with contact Important Message mailed to: Patient address on file     [name] 08/20/2020, 1:57 PM

## 2020-08-20 NOTE — Progress Notes (Signed)
Occupational Therapy Treatment Patient Details Name: Gary [name] MRN: [id number] DOB: Jul 19, 1955 Today's Date: 08/20/2020    History of present illness Gary [name] is a 65 year old chronically ill male with ESRD on HD MWF, combined heart failure (recently diagnosed), COPD (per [facility] provider), tobacco use disorder, hypertension, hyperlipidemia, uncontrolled diabetes, and PVD s/p right BKA and left first toe. He is presenting for acute dyspnea.  History was obtained from the patient and from PCP at [facility]   OT comments  Pt progressing gradually towards OT goals limited by increasing tremors in standing and tachycardia today. Pt able to demo bed mobility at min guard, sit to stand at Felicity A x 2 with RW but required increased assist to maintain balance with prolonged standing as tremors progressed. While assessing orthostatic vitals, HR noted to be at 220bpm in standing, so deferred further OOB activity. Pt able to demonstrate LB dressing of prosthetic LE, socks and shoes at Min A sitting EOB but requires increased assist in standing due to deficits mentioned above. Continue to recommend CIR for comprehensive therapies as pt motivated to participate and is below functional baseline.  BP lying: 93/65, 78 HR BP sitting: 81/64, 77 HR BP standing: 85/70, 220 HR   Follow Up Recommendations  CIR;Supervision/Assistance - 24 hour    Equipment Recommendations  3 in 1 bedside commode (for placement over toilet)    Recommendations for Other Services Rehab consult    Precautions / Restrictions Precautions Precautions: Fall;Other (comment) Precaution Comments: hx of R BKA with prosthetic in room Restrictions Weight Bearing Restrictions: No       Mobility Bed Mobility Overal bed mobility: Needs Assistance Bed Mobility: Supine to Sit;Sit to Supine     Supine to sit: Min guard;HOB elevated Sit to supine: Min guard   General bed mobility comments: quick movements to sit EOB but no use of bed  rails, min guard for safety due to impaired balance and increasing shakiness    Transfers Overall transfer level: Needs assistance Equipment used: Rolling walker (2 wheeled) Transfers: Sit to/from Stand Sit to Stand: Min assist;+2 physical assistance;+2 safety/equipment         General transfer comment: Min A x 2 for sit to stand with RW, increasing tremors in standing and after assessment of orthostatic vitals, HR noted at 220bpm. Increasing to Mod A for standing balance due to weakness and tremors    Balance Overall balance assessment: Needs assistance Sitting-balance support: Bilateral upper extremity supported;Feet supported Sitting balance-Leahy Scale: Fair Sitting balance - Comments: fair sitting balance but need for min guard for safety due to tremulous   Standing balance support: Bilateral upper extremity supported;During functional activity Standing balance-Leahy Scale: Poor Standing balance comment: reliant on UE support and external support                           ADL either performed or assessed with clinical judgement   ADL Overall ADL's : Needs assistance/impaired                     Lower Body Dressing: Moderate assistance;Sit to/from stand;Sitting/lateral leans Lower Body Dressing Details (indicate cue type and reason): Able to don prosthetic LE and L sock sitting EOB with min guard for balance. Min A to assist with shoes. Will need assist for balance and donning around waist in standing               General ADL Comments:  Limited by severe weakness, shakiness with movement and impaired balance in standing. Pt also with tachycardia with HR of 220bpm noted on dynamap in standing     Vision   Vision Assessment?: No apparent visual deficits   Perception     Praxis      Cognition Arousal/Alertness: Awake/alert Behavior During Therapy: WFL for tasks assessed/performed Overall Cognitive Status: Impaired/Different from baseline Area  of Impairment: Following commands;Awareness                       Following Commands: Follows one step commands with increased time;Follows multi-step commands with increased time   Awareness: Emergent   General Comments: Very pleasant and participatory, shows insight into considerable weakness.        Exercises     Shoulder Instructions       General Comments HR up to 220 bpm with manual reading >140bpm (difficulty with reading due to tremors)    Pertinent Vitals/ Pain       Pain Assessment: No/denies pain Pain Intervention(s): Monitored during session;Limited activity within patient's tolerance  Home Living                                          Prior Functioning/Environment              Frequency  Min 2X/week        Progress Toward Goals  OT Goals(current goals can now be found in the care plan section)  Progress towards OT goals: Progressing toward goals  Acute Rehab OT Goals Patient Stated Goal: feel better, get strength back OT Goal Formulation: With patient Time For Goal Achievement: 09/01/20 Potential to Achieve Goals: Good ADL Goals Pt Will Perform Grooming: with set-up;standing Pt Will Perform Lower Body Bathing: with supervision;sitting/lateral leans;sit to/from stand Pt Will Perform Lower Body Dressing: with supervision;sitting/lateral leans;sit to/from stand Pt Will Transfer to Toilet: with min guard assist;ambulating Pt Will Perform Toileting - Clothing Manipulation and hygiene: sitting/lateral leans;sit to/from stand;with set-up Pt/caregiver will Perform Home Exercise Program: Increased strength;Both right and left upper extremity;With theraband;Independently;With written HEP provided  Plan Discharge plan remains appropriate    Co-evaluation    PT/OT/SLP Co-Evaluation/Treatment: Yes Reason for Co-Treatment: For patient/therapist safety;To address functional/ADL transfers   OT goals addressed during session:  ADL's and self-care      AM-PAC OT "6 Clicks" Daily Activity     Outcome Measure   Help from another person eating meals?: A Little Help from another person taking care of personal grooming?: A Little Help from another person toileting, which includes using toliet, bedpan, or urinal?: A Lot Help from another person bathing (including washing, rinsing, drying)?: A Lot Help from another person to put on and taking off regular upper body clothing?: A Little Help from another person to put on and taking off regular lower body clothing?: A Lot 6 Click Score: 15    End of Session Equipment Utilized During Treatment: Gait belt;Rolling walker  OT Visit Diagnosis: Unsteadiness on feet (R26.81);Other abnormalities of gait and mobility (R26.89);Muscle weakness (generalized) (M62.81)   Activity Tolerance Treatment limited secondary to medical complications (Comment) (tachycardia)   Patient Left in bed;with call bell/phone within reach;with bed alarm set   Nurse Communication Mobility status;Other (comment) (HR, BP)        Time: 7741-2878 OT Time Calculation (min): 25 min  Charges: OT General Charges $OT  Visit: 1 Visit OT Treatments $Self Care/Home Management : 8-22 mins  Malachy Chamber, OTR/L Acute Rehab Services Office: [phone]   Layla Maw 08/20/2020, 3:04 PM

## 2020-08-20 NOTE — Progress Notes (Signed)
Consult received.  Spoke with Colletta Maryland, case manager with [facility].  She informed AC that [facility] does not cover CIR.  Per Colletta Maryland, pt will need to go home once medically stable.  TOC made aware.  AC will sign off.

## 2020-08-20 NOTE — Progress Notes (Signed)
PT Cancellation Note  Patient Details Name: Gary [name] MRN: [id number] DOB: 02-25-1956   Cancelled Treatment:    Reason Eval/Treat Not Completed: Patient at procedure or test/unavailable already at HD/unavailable for PT. Will attempt to return if time/schedule allow and if he is medically ready.    Windell Norfolk, DPT, PN1   Supplemental Physical Therapist [facility]    Pager [phone] Acute Rehab Office [phone]

## 2020-08-21 ENCOUNTER — Inpatient Hospital Stay (HOSPITAL_COMMUNITY): Payer: Medicare (Managed Care)

## 2020-08-21 DIAGNOSIS — N186 End stage renal disease: Secondary | ICD-10-CM

## 2020-08-21 DIAGNOSIS — E440 Moderate protein-calorie malnutrition: Secondary | ICD-10-CM | POA: Diagnosis present

## 2020-08-21 DIAGNOSIS — R590 Localized enlarged lymph nodes: Secondary | ICD-10-CM | POA: Diagnosis present

## 2020-08-21 DIAGNOSIS — E1122 Type 2 diabetes mellitus with diabetic chronic kidney disease: Secondary | ICD-10-CM

## 2020-08-21 DIAGNOSIS — Z992 Dependence on renal dialysis: Secondary | ICD-10-CM

## 2020-08-21 LAB — RENAL FUNCTION PANEL
Albumin: 2.7 g/dL — ABNORMAL LOW (ref 3.5–5.0)
Anion gap: 13 (ref 5–15)
BUN: 24 mg/dL — ABNORMAL HIGH (ref 8–23)
CO2: 28 mmol/L (ref 22–32)
Calcium: 9.3 mg/dL (ref 8.9–10.3)
Chloride: 92 mmol/L — ABNORMAL LOW (ref 98–111)
Creatinine, Ser: 5.16 mg/dL — ABNORMAL HIGH (ref 0.61–1.24)
GFR, Estimated: 12 mL/min — ABNORMAL LOW (ref >60)
Glucose, Bld: 171 mg/dL — ABNORMAL HIGH (ref 70–99)
Phosphorus: 3 mg/dL (ref 2.5–4.6)
Potassium: 4 mmol/L (ref 3.5–5.1)
Sodium: 133 mmol/L — ABNORMAL LOW (ref 135–145)

## 2020-08-21 LAB — CBC WITH DIFFERENTIAL/PLATELET
Abs Immature Granulocytes: 0.08 10*3/uL — ABNORMAL HIGH (ref 0.00–0.07)
Basophils Absolute: 0 10*3/uL (ref 0.0–0.1)
Basophils Relative: 0 %
Eosinophils Absolute: 0.1 10*3/uL (ref 0.0–0.5)
Eosinophils Relative: 1 %
HCT: 39.2 % (ref 39.0–52.0)
Hemoglobin: 12.3 g/dL — ABNORMAL LOW (ref 13.0–17.0)
Immature Granulocytes: 1 %
Lymphocytes Relative: 7 %
Lymphs Abs: 1 10*3/uL (ref 0.7–4.0)
MCH: 26.5 pg (ref 26.0–34.0)
MCHC: 31.4 g/dL (ref 30.0–36.0)
MCV: 84.5 fL (ref 80.0–100.0)
Monocytes Absolute: 1.2 10*3/uL — ABNORMAL HIGH (ref 0.1–1.0)
Monocytes Relative: 8 %
Neutro Abs: 11.9 10*3/uL — ABNORMAL HIGH (ref 1.7–7.7)
Neutrophils Relative %: 83 %
Platelets: 279 10*3/uL (ref 150–400)
RBC: 4.64 MIL/uL (ref 4.22–5.81)
RDW: 13.9 % (ref 11.5–15.5)
WBC: 14.3 10*3/uL — ABNORMAL HIGH (ref 4.0–10.5)
nRBC: 0 % (ref 0.0–0.2)

## 2020-08-21 LAB — GLUCOSE, CAPILLARY
Glucose-Capillary: 207 mg/dL — ABNORMAL HIGH (ref 70–99)
Glucose-Capillary: 169 mg/dL — ABNORMAL HIGH (ref 70–99)
Glucose-Capillary: 190 mg/dL — ABNORMAL HIGH (ref 70–99)
Glucose-Capillary: 211 mg/dL — ABNORMAL HIGH (ref 70–99)

## 2020-08-21 LAB — LACTIC ACID, PLASMA
Lactic Acid, Venous: 1.6 mmol/L (ref 0.5–1.9)
Lactic Acid, Venous: 1.3 mmol/L (ref 0.5–1.9)

## 2020-08-21 MED ORDER — LACTATED RINGERS IV BOLUS
500.0000 mL | Freq: Once | INTRAVENOUS | Status: AC
  Administered 2020-08-21: 500 mL via INTRAVENOUS

## 2020-08-21 MED ORDER — SODIUM CHLORIDE 0.9 % IV SOLN
1.5000 g | Freq: Four times a day (QID) | INTRAVENOUS | Status: DC
  Administered 2020-08-21 – 2020-08-23 (×8): 1.5 g via INTRAVENOUS
  Filled 2020-08-21 (×11): qty 4

## 2020-08-21 MED ORDER — ACETAMINOPHEN 325 MG PO TABS
650.0000 mg | ORAL_TABLET | Freq: Four times a day (QID) | ORAL | Status: DC | PRN
  Administered 2020-08-23: 650 mg via ORAL
  Filled 2020-08-21: qty 2

## 2020-08-21 MED ORDER — SODIUM CHLORIDE 0.9 % IV BOLUS
500.0000 mL | Freq: Once | INTRAVENOUS | Status: AC
  Administered 2020-08-21: 500 mL via INTRAVENOUS

## 2020-08-21 MED ORDER — LACTATED RINGERS IV BOLUS
200.0000 mL | Freq: Once | INTRAVENOUS | Status: DC
  Administered 2020-08-21: 200 mL via INTRAVENOUS

## 2020-08-21 NOTE — Progress Notes (Signed)
Subjective/Interm history: Febrile overnight--Tmax 101.7. Became hypotensive with MAPs in the 50s. Covering team obtained blood cultures, CXR.  He was sitting up in bed, eating, on rounds today. He says he feels "a whole lot better" however on further questioning, does not seem to remember the events from last night.  Objective:  Vital signs in last 24 hours: Vitals:   08/21/20 0425 08/21/20 0508 08/21/20 0512 08/21/20 0640  BP: (!) 99/35 (!) 80/57 (!) 78/32 (!) 100/48  Pulse: 68 69  68  Resp: $Remo'17 17  16  'kUcqz$ Temp: 99.2 F (37.3 C) 99.2 F (37.3 C)  99.8 F (37.7 C)  TempSrc: Oral Oral  Oral  SpO2: 100% 99%  100%  Weight:      Height:       General: chronically ill appearing, in NAD Cardiac: RRR, extremities warm Pulm: breathing comfortably on 2L with O2 saturation 100%, lung sounds are clear GI: soft, non-tender, bs active Skin: left 2nd toe ulcer remains without erythema. Scant normal appearing drainage. No erythema/edema overlying fistula  CXR: no infiltrates or effusions Tele monitor: LBBB, SR  Assessment/Plan:  Principal Problem:   Acute respiratory failure (HCC) Active Problems:   ESRD on hemodialysis (HCC)   Elevated troponin   Malnutrition of moderate degree  65 year old chronically ill male with ESRD on HD, CHF, +/- COPD who was admitted for acute respiratory failure secondary to volume overload in the setting of missed HD and quickly resolved with undergoing dialysis.  He was evaluated by PT/OT who recommended CIR for ongoing rehabilitation. He is now awaiting evaluation and insurance authorization with them.   SIRS due to no known source. Severe sepsis criteria met with Tmax 101.7, MAP 53, WBC >12k. Lactate 1.6. White count slightly up from admission to 14.3. Source remains unclear. CXR is unremarkable and lungs are clear. Left 2nd toe ulcer does not appear infected however would consider this to be most likely source. On re-evaluation this morning blood pressure  is improved and now afebrile.  -start unasyn -500cc fluid bolus -follow culture results.  Physical deconditioning -plan will be to get re-evaluated by CIR however will need to defer discharge until sepsis is ruled out and hemodynamically stable. If not thought to be a candidate by CIR, then will work on SNF for acute rehab - continue PT/OT while awaiting placement  #Insulin dependent type 2 diabetes. Fasting glucose 207 this morning.  -Will defer any changes for today in light of suspected infection. continue lantus 13U daily with SSI  #Peripheral arterial disease with chronic left 2nd toe wound. Does not appear infected. Follows with podiatry.  -Continue antiplatelet therapy with aspirin.  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 euvolumic on exam #Chronic hypertension, hyperlipidemia, atherosclerotic disease.  -metoprolol, amlodipine and hydralazine being held due to hypotension. May need these to be limited to non-HD days -continue home Asprin, crestor  #ESRD on HD. Now below EDW. Management per nephrology.  #Mediastinal and hilar adenopathy as noted on chest CT from admission #Tobacco use disorder--50 pack year smoking history -PET scan to be arranged after discharge based on CT report recommendations  #Acute hypoxic respiratory failure (resolved) secondary to volume overload in the setting of missed HD session and CHF.    Best Practice: CODE: FULL VTE prophylaxis: heparin 5000U q8h After 5pm on weekdays and 1pm on weekends: On Call pager [phone] Dispo: CIR vs SNF. Medically stable for discharge  Mitzi Hansen, MD Internal Medicine Resident PGY-2 [facility] Pager: [phone]  08/21/2020 7:32 AM

## 2020-08-21 NOTE — PMR Pre-admission (Incomplete)
PMR Admission Coordinator Pre-Admission Assessment  Patient: Gary [name] is an 65 y.o., male MRN: [id number] DOB: [birth year] Height: 5\' 7"  (170.2 cm) Weight: 53.5 kg  Insurance Information HMO: ***    PPO: ***     PCP:      IPA:      80/20:      OTHER:  PRIMARY: [facility]      Policy#: [id number]      Subscriber: patient CM Name: Colletta [place]      Phone#: [phone]     Fax#: *** Pre-Cert#: ***      Employer: *** Benefits:  Phone #: ***     Name: *** Irene Shipper. Date: ***     Deduct: ***      Out of Pocket Max: ***      Life Max: *** CIR: ***      SNF: *** Outpatient: ***     Co-Pay: *** Home Health: ***      Co-Pay: *** DME: ***     Co-Pay: *** Providers: *** SECONDARY:       Policy#:      Phone#:   Financial Counselor:       Phone#:   The "Data Collection Information Summary" for patients in Inpatient Rehabilitation Facilities with attached "Privacy Act [place] Records" was provided and verbally reviewed with: N/A  Emergency Contact Information Contact Information    Name Relation Home Work Mobile   [place] Spouse [phone]  [phone]      Current Medical History  Patient Admitting Diagnosis: acute respiratory failure; debility History of Present Illness: ***    Patient's medical record from [facility] has been reviewed by the rehabilitation admission coordinator and physician.  Past Medical History  Past Medical History:  Diagnosis Date  . Blurred vision   . Chronic kidney disease    sees [facility]  . Constipation   . Constipation   . Diabetes mellitus    type 2  . GERD (gastroesophageal reflux disease)   . High cholesterol   . Hypertension   . Insomnia   . Peripheral artery disease ([place])   . [place] stain of face    Left V1 distribution, including upper eyelid  . Sensorineural hearing loss    left ear  . Vitamin D deficiency     Family History   family history includes Cancer in his father; Diabetes in his  mother; Hypertension in his mother.  Prior Rehab/Hospitalizations Has the patient had prior rehab or hospitalizations prior to admission? No  Has the patient had major surgery during 100 days prior to admission? No   Current Medications  Current Facility-Administered Medications:  .  acetaminophen (TYLENOL) tablet 650 mg, 650 mg, Oral, Q6H PRN, Katsadouros, Vasilios, MD .  albuterol (PROVENTIL) (2.5 MG/3ML) 0.083% nebulizer solution 2.5 mg, 2.5 mg, Nebulization, Q2H PRN, [name], Rylee, MD .  ampicillin-sulbactam (UNASYN) 1.5 g in sodium chloride 0.9 % 100 mL IVPB, 1.5 g, Intravenous, Q6H, Gilles Chiquito B, MD, Last Rate: 200 mL/hr at 08/21/20 1249, 1.5 g at 08/21/20 1249 .  aspirin EC tablet 81 mg, 81 mg, Oral, Daily, [name], Rylee, MD, 81 mg at 08/21/20 1040 .  Chlorhexidine Gluconate Cloth 2 % PADS 6 each, 6 each, Topical, Q0600, Mitzi Hansen, MD, 6 each at 08/18/20 0721 .  Chlorhexidine Gluconate Cloth 2 % PADS 6 each, 6 each, Topical, Q0600, Janalee Dane, PA-C, 6 each at 08/18/20 3299 .  Chlorhexidine Gluconate Cloth 2 % PADS 6 each, 6  each, Topical, Q0600, Janalee Dane, PA-C, 6 each at 08/19/20 1100 .  doxercalciferol (HECTOROL) injection 3 mcg, 3 mcg, Intravenous, Q M,W,F-HD, Collins, Hervey Ard, PA-C, 3 mcg at 08/20/20 0539 .  feeding supplement (NEPRO CARB STEADY) liquid 237 mL, 237 mL, Oral, BID BM, Collins, Samantha G, PA-C, 237 mL at 08/21/20 1045 .  heparin injection 5,000 Units, 5,000 Units, Subcutaneous, Q8H, [name], Rylee, MD, 5,000 Units at 08/21/20 0830 .  insulin aspart (novoLOG) injection 0-9 Units, 0-9 Units, Subcutaneous, TID WC, [name], Rylee, MD, 2 Units at 08/21/20 1241 .  insulin glargine (LANTUS) injection 13 Units, 13 Units, Subcutaneous, QHS, [name], Rylee, MD, 13 Units at 08/20/20 2354 .  multivitamin (RENA-VIT) tablet 1 tablet, 1 tablet, Oral, QHS, Sid Falcon, MD, 1 tablet at 08/20/20 2354 .  nicotine (NICODERM CQ - dosed in  mg/24 hours) patch 21 mg, 21 mg, Transdermal, Daily, [name], Rylee, MD, 21 mg at 08/19/20 1004 .  pantoprazole (PROTONIX) EC tablet 40 mg, 40 mg, Oral, Daily, [name], Rylee, MD, 40 mg at 08/21/20 1040 .  rosuvastatin (CRESTOR) tablet 20 mg, 20 mg, Oral, Daily, [name], Rylee, MD, 20 mg at 08/21/20 1040 .  sevelamer carbonate (RENVELA) tablet 3,200 mg, 3,200 mg, Oral, TID WC, [name], Rylee, MD, 3,200 mg at 08/20/20 1745  Patients Current Diet:  Diet Order            Diet renal with fluid restriction Fluid restriction: 1200 mL Fluid; Room service appropriate? 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 No  Prior Activity Level Limited Community (1-2x/wk): leaves house ~1x/week  Prior Functional Level Self Care: Did the patient need help bathing, dressing, using the toilet or eating? Independent  Indoor Mobility: Did the patient need assistance with walking from room to room (with or without device)? {Prior Functional JQBHA:193790240}  Stairs: Did the patient need assistance with internal or external stairs (with or without device)? {Prior Functional XBDZH:299242683}  Functional Cognition: Did the patient need help planning regular tasks such as shopping or remembering to take medications? Needed some help  Home Assistive Devices / [place] Devices/Equipment: Cane (specify quad or straight),Prosthesis Home Equipment: Tub bench,Bedside commode,Walker - 2 wheels,Grab bars - tub/shower  Prior Device Use: Indicate devices/aids used by the patient prior to current illness, exacerbation or injury? Orthotics/Prosthetics  Current Functional Level Cognition  Overall  Cognitive Status: Impaired/Different from baseline Orientation Level: Oriented X4 Following Commands: Follows one step commands with increased time,Follows multi-step commands with increased time General Comments: Very pleasant and participatory, shows insight into considerable weakness.    Extremity Assessment (includes Sensation/Coordination)  Upper Extremity Assessment: Generalized weakness (tremulous)  Lower Extremity Assessment: Defer to PT evaluation    ADLs  Overall ADL's : Needs assistance/impaired Eating/Feeding: Set up,Sitting Grooming: Min guard,Sitting Upper Body Bathing: Min guard,Sitting Lower Body Bathing: Moderate assistance,Sit to/from stand,Sitting/lateral leans Lower Body Bathing Details (indicate cue type and reason): Able to reach down to bottom LE with min guard but required Max A for LB bathing of peri region in standing due to shakiness and weakness Upper Body Dressing : Min guard,Sitting Lower Body Dressing: Moderate assistance,Sit to/from stand,Sitting/lateral leans  Lower Body Dressing Details (indicate cue type and reason): Able to don prosthetic LE and L sock sitting EOB with min guard for balance. Min A to assist with shoes. Will need assist for balance and donning around waist in standing Toileting- Clothing Manipulation and Hygiene: Moderate assistance,Sitting/lateral lean,Sit to/from stand General ADL Comments: Limited by severe weakness, shakiness with movement and impaired balance in standing. Pt also with tachycardia with HR of 220bpm noted on dynamap in standing    Mobility  Overal bed mobility: Needs Assistance Bed Mobility: Supine to Sit,Sit to Supine Rolling: Supervision Sidelying to sit: Min assist,HOB elevated Supine to sit: Min guard,HOB elevated Sit to supine: Min guard General bed mobility comments: Min guard for safety due to shakiness, no physical assist given    Transfers  Overall transfer level: Needs assistance Equipment used: Rolling  walker (2 wheeled) Transfers: Sit to/from Stand Sit to Stand: Min assist,+2 physical assistance,+2 safety/equipment General transfer comment: Very tremulous, min Ax2 for boost into standing and for steadying upon standing    Ambulation / Gait / Stairs / Wheelchair Mobility  Ambulation/Gait General Gait Details: unable- fatigue and weakness    Posture / Balance Dynamic Sitting Balance Sitting balance - Comments: fair sitting balance, able to don prostethetic sitting at EOB with min guard for safety due to tremors Balance Overall balance assessment: Needs assistance Sitting-balance support: Bilateral upper extremity supported,Feet supported Sitting balance-Leahy Scale: Fair Sitting balance - Comments: fair sitting balance, able to don prostethetic sitting at EOB with min guard for safety due to tremors Standing balance support: Bilateral upper extremity supported,During functional activity Standing balance-Leahy Scale: Poor Standing balance comment: reliant on UE support and external support    Special needs/care consideration Oxygen 2L nasal cannula, Skin Lateral 2nd toe on left foot has open wound; Surgical incision: toe/anterior,left; Amputation: toe/left, Diabetic management novoLOG: 0-9 units 3x daily with meals; Lantus: 13 units at bedtime and Designated visitor Devery Murgia, wife   Previous Home Environment (from acute therapy documentation) Living Arrangements: Spouse/significant other  Lives With: Spouse Available Help at Discharge: Family,Available 24 hours/day Type of Home: [place] Name: [place]: One level Home Access: Stairs to enter,Ramped entrance Entrance Stairs-Rails: Right,Left Entrance Stairs-Number of Steps: 3 Bathroom Shower/Tub: Optometrist: No Home Care Services: No Additional Comments: reports his wheelchair is messed up  [place] for Discharge  Living Setting: Patient's home Type of Home at Discharge: [place]: One level Discharge Home Access: Stairs to enter,Ramped entrance Entrance Stairs-Rails: Right,Left Entrance Stairs-Number of Steps: 3 Discharge Bathroom Shower/Tub: Tub/shower unit Discharge Bathroom Toilet: Standard Discharge Bathroom Accessibility: No Does the patient have any problems obtaining your medications?: No  Social/Family/Support Systems Anticipated Caregiver: Willmar Stockinger, wife Anticipated Caregiver's Contact Information: [phone] Ability/Limitations of Caregiver: Able to provide Min A Caregiver Availability: 24/7 Discharge Plan Discussed with Primary Caregiver: Yes Is Caregiver In Agreement with Plan?: Yes Does Caregiver/Family have Issues with Lodging/Transportation while Pt is in Rehab?: No  Goals Patient/Family Goal for Rehab: *** Expected length of stay: *** Pt/Family Agrees to Admission and willing to participate: Yes Program Orientation Provided & Reviewed with Pt/Caregiver Including Roles  & Responsibilities: Yes  Decrease burden of Care through IP rehab admission: NA  Possible need for SNF placement upon discharge: Not anticipated  Patient Condition: {PATIENT'S CONDITION:22832}  Preadmission Screen Completed By:  Bethel Born, 08/21/2020 3:45 PM ______________________________________________________________________   Discussed status with Dr. Marland Kitchen on ***  at *** and received approval for admission today.  Admission Coordinator:  Bethel Born, CCC-SLP, time ***Sudie Grumbling ***   Assessment/Plan: Diagnosis: 1. Does the need for close, 24 hr/day Medical supervision in concert with the patient's rehab needs make it unreasonable for this patient to be served in a less intensive setting? {yes_no_potentially:3041433} 2. Co-Morbidities requiring supervision/potential complications: *** 3. Due to {due MB:3403709}, does the patient require 24 hr/day rehab nursing?  {yes_no_potentially:3041433} 4. Does the patient require coordinated care of a physician, rehab nurse, PT, OT, and SLP to address physical and functional deficits in the context of the above medical diagnosis(es)? {yes_no_potentially:3041433} Addressing deficits in the following areas: {deficits:3041436} 5. Can the patient actively participate in an intensive therapy program of at least 3 hrs of therapy 5 days a week? {yes_no_potentially:3041433} 6. The potential for patient to make measurable gains while on inpatient rehab is {potential:3041437} 7. Anticipated functional outcomes upon discharge from inpatient rehab: {functional outcomes:304600100} PT, {functional outcomes:304600100} OT, {functional outcomes:304600100} SLP 8. Estimated rehab length of stay to reach the above functional goals is: *** 9. Anticipated discharge destination: {anticipated dc setting:21604} 10. Overall Rehab/Functional Prognosis: {potential:3041437}   MD Signature: ***

## 2020-08-21 NOTE — Progress Notes (Signed)
CLINICAL UPDATE:  Paged by RN for BP 72/44. Manual r/p 82/48. Patient evaluated at bedside, sleeping upon entering the room. Patient currently asymptomatic, only states he is tired. Denies fever, chest pain, dyspnea, nausea, vomiting, abdominal pain. Does report one episode of diarrhea earlier today. Per chart review, patient had similar episode last night.  General: Sleeping comfortably, no acute distress CV: Regular rate, rhythm. No m/r/g Pulm: Normal WOB, Clear to auscultation bilaterally. Abd: Soft, non-tender, non-distended. Normoactive bowel sounds Neuro: Lethargic, responding to questions appropriately.  A/P: Patient has hx ESRD on MWF HD, CHF. Patient's weight yesterday 53.5kg, EDW reported 59kg. Last night patient was febrile, hypotensive and received 700cc. Infectious work-up started then, BCx NGTD.  Currently afebrile, otherwise vital signs appear stable. Will give 500cc bolus now, continue to monitor. -LR 500cc bolus

## 2020-08-21 NOTE — Progress Notes (Addendum)
Subjective: Noted febrile overnight 101.7 and BP dropped to 50s, chest x-ray done no active cardiopulmonary disease, blood cultures were taken.  Afebrile now  Objective Vital signs in last 24 hours: Vitals:   08/21/20 0640 08/21/20 0800 08/21/20 1000 08/21/20 1100  BP: (!) 100/48 (!) 112/37 (!) 94/45 (!) 105/54  Pulse: 68 70 68 63  Resp: 16 16  15   Temp: 99.8 F (37.7 C) 98.8 F (37.1 C) 98.9 F (37.2 C) 98.2 F (36.8 C)  TempSrc: Oral Oral Oral Oral  SpO2: 100% 99% 100% 100%  Weight:    53.5 kg  Height:    5\' 7"  (1.702 m)   Weight change:   Physical Exam: General: Thin chronically ill-appearing elderly male NAD, alert appropriate  Heart: RRR no MRG Lungs: CTA, 2 L O2 nasal cannula O2 sat 100% unlabored breathing Abdomen: Bowel sounds normoactive, soft NTND no ascites Extremities: No pedal edema, R BKA stump no wound Dialysis Access: Positive bruit left upper arm AVF no sign of infection  [facility], 3:45 hours, BFR 400, DFR 800, EDW 59kg, 2K/2Ca, AVF 15g, heparin 2200 unit bolus Hectorol 6 mcg IV q HD  Problem/Plan: 1. Hypoxia: O2 sat 79% on RA on admit, improved but on [place].  Etiology likely from missed HD but CXR = NAD on admissionbut he did havesignificantly elevated BNP and rhonchi on exam.  Noted 2 L UF yesterday, BP dropped last night with febrile illness, his SOB symptoms resolved s/p serial HD, now below his outpatient EDW.Will need to be adjusted at discharge.   Note did require 500 cc for potential last night with fever 2. Febrile illness/SIRS with hypotension = work-up per admit with 500 cc fluid bolus given, this a.m. BP stable SBP over 100, CXR =NAD, blood cultures obtained 3. ESRD:Dialyzes on MWF schedule, missed HD4/18 due to transportation issues.Continue MWF schedule 4. Hypertension: BP low as above, now off all BP meds.  5. Anemia:Hemoglobin 12.3at goal, no ESA indicated at this time.   6. Metabolic bone disease:Calcium elevated to 10.3 this AM, have  reduced hectorol dose.Phos controlled, continue sevelamer. 7. Nutrition:Renal/carb modified diet.  Albumin low,started  protein supplement.   8.   Deconditioning = noted awaiting rehab/CIR versus NHP 9.    Diabetes mellitus: Management per admitting team   Ernest Haber, PA-C [facility] Beeper [phone] 08/21/2020,11:35 AM  LOS: 4 days   Labs: Basic Metabolic Panel: Recent Labs  Lab 08/19/20 0345 08/20/20 0234 08/21/20 0048  NA 135 136 133*  K 3.9 4.1 4.0  CL 97* 95* 92*  CO2 27 29 28   GLUCOSE 102* 113* 171*  BUN 20 35* 24*  CREATININE 5.05* 7.24* 5.16*  CALCIUM 8.9 9.3 9.3  PHOS 2.9 3.6 3.0   Liver Function Tests: Recent Labs  Lab 08/19/20 0345 08/20/20 0234 08/21/20 0048  ALBUMIN 2.6* 2.6* 2.7*   No results for input(s): LIPASE, AMYLASE in the last 168 hours. No results for input(s): AMMONIA in the last 168 hours. CBC: Recent Labs  Lab 08/17/20 1341 08/18/20 0043 08/20/20 0840 08/21/20 0048  WBC 13.2* 12.2* 13.2* 14.3*  NEUTROABS 10.8*  --   --  11.9*  HGB 13.6 11.1* 11.2* 12.3*  HCT 42.0 34.8* 35.6* 39.2  MCV 83.2 82.9 83.6 84.5  PLT 246 196 276 279   Cardiac Enzymes: No results for input(s): CKTOTAL, CKMB, CKMBINDEX, TROPONINI in the last 168 hours. CBG: Recent Labs  Lab 08/20/20 0650 08/20/20 1246 08/20/20 1813 08/20/20 2229  08/21/20 0642  GLUCAP 112* 203* 308* 101* 207*    Studies/Results: DG Chest Port 1 View  Result Date: 08/21/2020 CLINICAL DATA:  Hypoxia. EXAM: PORTABLE CHEST 1 VIEW COMPARISON:  08/17/2020 FINDINGS: Stable cardiomediastinal contours. Aortic atherosclerosis. No pleural effusion or edema. No airspace densities. Visualized osseous structures are unremarkable. IMPRESSION: No acute cardiopulmonary abnormalities. Electronically Signed   By: Kerby Moors M.D.   On: 08/21/2020 07:52   Medications: . ampicillin-sulbactam (UNASYN) IV    .  sodium chloride     . aspirin EC  81 mg Oral Daily  . Chlorhexidine Gluconate Cloth  6 each Topical Q0600  . Chlorhexidine Gluconate Cloth  6 each Topical Q0600  . Chlorhexidine Gluconate Cloth  6 each Topical Q0600  . doxercalciferol  3 mcg Intravenous Q M,W,F-HD  . feeding supplement (NEPRO CARB STEADY)  237 mL Oral BID BM  . heparin  5,000 Units Subcutaneous Q8H  . insulin aspart  0-9 Units Subcutaneous TID WC  . insulin glargine  13 Units Subcutaneous QHS  . multivitamin  1 tablet Oral QHS  . nicotine  21 mg Transdermal Daily  . pantoprazole  40 mg Oral Daily  . rosuvastatin  20 mg Oral Daily  . sevelamer carbonate  3,200 mg Oral TID WC

## 2020-08-21 NOTE — Progress Notes (Signed)
Inpatient Rehab Admissions:  [facility] informed CM that they would like pt to be assessed for potential CIR admission.  CM place another CIR consult.  Inpatient Rehab Consult received.  I met with patient at the bedside for rehabilitation assessment and to discuss goals and expectations of an inpatient rehab admission.  Also talked to pt's wife, Abigail Butts on the phone. Pt and wife both acknowledge understanding.  Both interested in pt pursuing CIR. Will continue to follow.  Signed: Gayland Curry, [place], [place] Admissions Coordinator [phone]

## 2020-08-21 NOTE — Progress Notes (Addendum)
Paged by RN that patient had fever of 100.7 and 100.7 and required 2 L nasal cannula as oxygen saturations dropped to 85% on room air.  She also states that patient had episode of dizziness, lightheadedness when sitting up in bed.    Upon bedside evaluation patient states that he has felt weak all day and has had episodes of chills.  He is in no respiratory distress.  Denies shortness of breath, cough, chest pain, abdominal pain, diarrhea.  Patient states he does not make urine.  Patient is have leukocytosis since admission, most recent WBC 13.2.  Setting of episodes of fever will order blood cultures x2, chest x-ray, CBC.  Tylenol 650 mg ordered as needed for fever.  Patient did go to dialysis today, may be hypovolemic because of this leading to his lightheadedness dizziness when sitting up in bed.  We will continue to monitor. Trying to avoid IVF if possible due to ESRD.  ADDENDUM: q30 m BP check>SBP dropped more at 70s with MAP remains~54. Checked patient at bedside. Reports fatigue and being weak. He does not seems septic. Extremities warm. Lungs are clear.  Does not appear septic and LA negative. HB stable and no evidence of bleeding as the cause of hypotension. It seems like he has been hypotensive and orthostatic after HD and likely hypovolemic.  -Will try very gentle IVF given MAP remains low and as patient is symptomatic. CXR is clear w/o congestion. (Paged nephrology before ordering IV F And will wait for further rec from nephrology.)  -Holding home Amlodipine and Metop for this AM.   Sanjuana Letters  IM PGY-1  Bobbe Medico  IM PGY-3

## 2020-08-21 NOTE — Plan of Care (Signed)

## 2020-08-22 DIAGNOSIS — I739 Peripheral vascular disease, unspecified: Secondary | ICD-10-CM

## 2020-08-22 DIAGNOSIS — Z720 Tobacco use: Secondary | ICD-10-CM

## 2020-08-22 DIAGNOSIS — E440 Moderate protein-calorie malnutrition: Secondary | ICD-10-CM

## 2020-08-22 LAB — RENAL FUNCTION PANEL
Albumin: 2.3 g/dL — ABNORMAL LOW (ref 3.5–5.0)
Anion gap: 10 (ref 5–15)
BUN: 44 mg/dL — ABNORMAL HIGH (ref 8–23)
CO2: 26 mmol/L (ref 22–32)
Calcium: 9.3 mg/dL (ref 8.9–10.3)
Chloride: 96 mmol/L — ABNORMAL LOW (ref 98–111)
Creatinine, Ser: 6.79 mg/dL — ABNORMAL HIGH (ref 0.61–1.24)
GFR, Estimated: 8 mL/min — ABNORMAL LOW (ref >60)
Glucose, Bld: 247 mg/dL — ABNORMAL HIGH (ref 70–99)
Phosphorus: 3.7 mg/dL (ref 2.5–4.6)
Potassium: 4 mmol/L (ref 3.5–5.1)
Sodium: 132 mmol/L — ABNORMAL LOW (ref 135–145)

## 2020-08-22 LAB — GLUCOSE, CAPILLARY
Glucose-Capillary: 163 mg/dL — ABNORMAL HIGH (ref 70–99)
Glucose-Capillary: 221 mg/dL — ABNORMAL HIGH (ref 70–99)
Glucose-Capillary: 185 mg/dL — ABNORMAL HIGH (ref 70–99)
Glucose-Capillary: 242 mg/dL — ABNORMAL HIGH (ref 70–99)

## 2020-08-22 NOTE — Plan of Care (Signed)

## 2020-08-22 NOTE — Progress Notes (Signed)
Subjective: 65 year old male presents the office today for evaluation of left second toe wound.  We have been contacted by [facility] about new ulceration.  He states he has been applying moisturizer between his toes which may have aggravated this and causing skin breakdown. Small mount of drainage.  He has no pain.  Some mild swelling to the toe he reports but not to the foot.  Upon entering the room the patient was found to be weak and short of breath.  He was triaged by the nurses as well.  [facility] was contacted who wanted to evaluate the patient prior to being seen in the emergency room.  He missed his dialysis session yesterday as his transportation did not come get him.  Objective: AAO x3, NAD DP/PT pulses palpable bilaterally, CRT less than 3 seconds This exam was limited due to his other issues.  There is mild edema noted to the second toe with a small mount of clear drainage from the lateral aspect.  Superficial area skin breakdown but there is no probing to bone or tunneling.  There is no warmth of the foot.  No ascending cellulitis. No pain with calf compression, swelling, warmth, erythema  Assessment: Ulcer, swelling left second toe  Plan: Patient's blood pressure was low no short of breath.  He was placed on oxygen and his oxygen saturations improved and he started to feel better.  His blood sugar was 215.  We contacted [facility] him with a note of his symptoms however they wanted to evaluate the patient prior to going to the emergency room.  The toe was bandaged and after discussion with the triage at [facility] they are to bring the patient directly to the ER. Our RNs contacted [facility] and triaged the patient.   Trula Slade DPM

## 2020-08-22 NOTE — Progress Notes (Signed)
Subjective: Feels stronger and wants to get out of bed no complaints, noted afebrile  Objective Vital signs in last 24 hours: Vitals:   08/22/20 0033 08/22/20 0217 08/22/20 0615 08/22/20 0742  BP: (!) 96/50 (!) 98/49 (!) 106/39 (!) 113/59  Pulse: 66 67 64 67  Resp: 17  17 18   Temp: 98.5 F (36.9 C)  98.4 F (36.9 C) 98.3 F (36.8 C)  TempSrc: Oral   Oral  SpO2: 99% 99% 100% 99%  Weight:      Height:       Weight change: -2.4 kg   Physical Exam: General: Thin chronically ill-appearing elderly male NAD, alert appropriate  Heart: RRR no MRG Lungs: CTA, 2 L O2 nasal cannula O2 sat 100% unlabored breathing Abdomen: Bowel sounds normoactive, soft NTND no ascites Extremities: No pedal edema, 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, 3:45 hours, BFR 400, DFR 800, EDW 59kg, 2K/2Ca, AVF 15g, heparin 2200 unit bolus Hectorol 6 mcg IV q HD  Problem/Plan: 1. Hypoxia: O2 sat 79% on RAon admit, improved but on [place].  Etiology likely from missed HD but CXR = NAD on admissionbut he did havesignificantly elevated BNP and rhonchi on exam.  Noted 2 L UF HD 4/22 BP dropped  with febrile illness 4/23, his SOB symptoms  have resolved resolved s/p serial HD, now below his outpatient EDW( probably is lost some body weight). Did require yesterday and last night 500 cc fluids Per admit staff with low BP/tomorrow keep even on hemo- 2. Febrile illness/SIRS with hypotension = work-up per admit with 500 cc fluid bolus given, 4/23 a.m. and at bedtime, now BP 113/59, patient asymptomatic  CXR =NAD, blood cultures obtained no growth x1 day WBC 14.3 3. ESRD:Dialyzes on MWF schedule, missed HD4/18 due to transportation issues.Continue MWF schedule 4. Hypertension: BP low as above, now off all BP meds. 5. Anemia:Hemoglobin 12.3at goal, no ESA indicated at this time.   6. Metabolic bone disease:Calciumelevated to 10.3 this AM, have  reduced hectorol dose 6 to 3 mcg q. dialysis.Phos 3.7 continue binder Renvela 7. Nutrition:ALB 2.3renal/carb modified diet.started  protein supplement 8.    Deconditioning = noted awaiting rehab/CIR versus NHP 9.    Diabetes mellitus: Management per admitting team  Ernest Haber, PA-C [facility] Beeper [phone] 08/22/2020,11:16 AM  LOS: 5 days   Labs: Basic Metabolic Panel: Recent Labs  Lab 08/20/20 0234 08/21/20 0048 08/22/20 0320  NA 136 133* 132*  K 4.1 4.0 4.0  CL 95* 92* 96*  CO2 29 28 26   GLUCOSE 113* 171* 247*  BUN 35* 24* 44*  CREATININE 7.24* 5.16* 6.79*  CALCIUM 9.3 9.3 9.3  PHOS 3.6 3.0 3.7   Liver Function Tests: Recent Labs  Lab 08/20/20 0234 08/21/20 0048 08/22/20 0320  ALBUMIN 2.6* 2.7* 2.3*   No results for input(s): LIPASE, AMYLASE in the last 168 hours. No results for input(s): AMMONIA in the last 168 hours. CBC: Recent Labs  Lab 08/17/20 1341 08/18/20 0043 08/20/20 0840 08/21/20 0048  WBC 13.2* 12.2* 13.2* 14.3*  NEUTROABS 10.8*  --   --  11.9*  HGB 13.6 11.1* 11.2* 12.3*  HCT 42.0 34.8* 35.6* 39.2  MCV 83.2 82.9 83.6 84.5  PLT 246 196 276 279   Cardiac Enzymes: No results for input(s): CKTOTAL, CKMB, CKMBINDEX, TROPONINI in the last 168 hours. CBG: Recent Labs  Lab 08/21/20 [phone] 08/21/20  1133 08/21/20 1615 08/21/20 2044 08/22/20 0638  GLUCAP 207* 169* 190* 211* 163*    Studies/Results: DG Chest Port 1 View  Result Date: 08/21/2020 CLINICAL DATA:  Hypoxia. EXAM: PORTABLE CHEST 1 VIEW COMPARISON:  08/17/2020 FINDINGS: Stable cardiomediastinal contours. Aortic atherosclerosis. No pleural effusion or edema. No airspace densities. Visualized osseous structures are unremarkable. IMPRESSION: No acute cardiopulmonary abnormalities. Electronically Signed   By: Kerby Moors M.D.   On: 08/21/2020 07:52   Medications: . ampicillin-sulbactam (UNASYN)  IV 1.5 g (08/22/20 0435)   . aspirin EC  81 mg Oral Daily  . Chlorhexidine Gluconate Cloth  6 each Topical Q0600  . Chlorhexidine Gluconate Cloth  6 each Topical Q0600  . Chlorhexidine Gluconate Cloth  6 each Topical Q0600  . doxercalciferol  3 mcg Intravenous Q M,W,F-HD  . feeding supplement (NEPRO CARB STEADY)  237 mL Oral BID BM  . heparin  5,000 Units Subcutaneous Q8H  . insulin aspart  0-9 Units Subcutaneous TID WC  . insulin glargine  13 Units Subcutaneous QHS  . multivitamin  1 tablet Oral QHS  . nicotine  21 mg Transdermal Daily  . pantoprazole  40 mg Oral Daily  . rosuvastatin  20 mg Oral Daily  . sevelamer carbonate  3,200 mg Oral TID WC

## 2020-08-22 NOTE — NC FL2 (Signed)
[place] LEVEL OF CARE SCREENING TOOL     IDENTIFICATION  Patient Name: Gary [name] Birthdate: [birth year] Sex: male Admission Date (Current Location): 08/17/2020  [facility] and [place] Number:  Herbalist and Address:  [facility] [place], [address], [place], [place] [address]      Provider Number: [id number]  Attending Physician Name and Address:  Sid Falcon, MD  Relative Name and Phone Number:  Ezrael Sam, [phone]    Current Level of Care: Hospital Recommended Level of Care: [place] Prior Approval Number:    Date Approved/Denied:   PASRR Number: [id number]  Discharge Plan: SNF    Current Diagnoses: Patient Active Problem List   Diagnosis Date Noted  . Malnutrition of moderate degree 08/21/2020  . Mediastinal adenopathy 08/21/2020  . Hilar adenopathy 08/21/2020  . 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 Elevated troponin   . Acute respiratory failure ([place]) 08/17/2020  . Hypokalemia 10/30/2018  . Hypothyroidism, unspecified 09/25/2018  . Encounter for removal of sutures 09/11/2018  . Mild protein-calorie malnutrition ([place]) 07/10/2018  . Pain, unspecified 06/24/2018  . Pruritus, unspecified 06/24/2018  . Shortness of breath 06/24/2018  . Acquired absence of right leg below knee ([place]) 06/18/2018  . Anemia in chronic kidney disease 06/18/2018  . Arteriovenous fistula, acquired ([place]) 06/18/2018  . Hypertensive chronic kidney disease with stage 1 through stage 4 chronic kidney disease, or unspecified chronic kidney disease 06/18/2018  . Coagulation defect, unspecified ([place]) 06/18/2018  . ESRD on hemodialysis ([place]) 06/18/2018  . Fluid overload, unspecified 06/18/2018  . Hyperlipidemia, unspecified 06/18/2018  . Iron deficiency anemia, unspecified 06/18/2018  . Other disorders resulting from impaired renal tubular function 06/18/2018  . Patient's noncompliance  with other medical treatment and regimen 06/18/2018  . Secondary hyperparathyroidism of renal origin ([place]) 06/18/2018  . Type 2 diabetes mellitus with diabetic neuropathy, unspecified ([place]) 06/18/2018  . Type 2 diabetes mellitus with other diabetic kidney complication ([place]) 44/81/8563  . Below knee amputation status 01/28/2016  . Aftercare following surgery of the circulatory system, [place] 12/01/2013  . Follow-up examination, following unspecified surgery 11/25/2012  . Atherosclerosis of native arteries of the extremities with ulceration(440.23) 11/25/2012  . Atherosclerotic PVD with ulceration ([place]) 10/01/2012  . Pure hypercholesterolemia 09/26/2012  . Atherosclerosis of native arteries of the extremities, unspecified 09/26/2012  . Type II or unspecified type diabetes mellitus with neurological manifestations, not stated as uncontrolled(250.60) 09/26/2012  . Type II or unspecified type diabetes mellitus with ophthalmic manifestations, not stated as uncontrolled(250.50) 09/26/2012  . blurred vision due to refraction problems 09/26/2012  . poor dentition 09/26/2012  . poor dentition 09/26/2012  . Insomnia 09/26/2012  . Hypertension 09/26/2012  . Tobacco use disorder 09/26/2012  . Peripheral vascular disease 09/26/2012  . BKA right leg 09/26/2012  . Vitamin D deficiency aeb lab values of 16.5 ng/mL 09/26/2012  . Routine general medical examination at a health care facility 09/26/2012  . GERD (gastroesophageal reflux disease) 09/26/2012  . Sensorineural hearing loss (Per ENT) 09/26/2012  . constipation 09/26/2012  . Osteomyelitis of left great toe 09/26/2012  . Toe osteomyelitis, left ([place]) 09/26/2012  . 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    Orientation RESPIRATION BLADDER Height & Weight     Self,Time,Situation,Place  Normal Continent Weight: 117 lb 15.2 oz (53.5 kg) Height:  5\' 7"  (170.2 cm)  BEHAVIORAL SYMPTOMS/MOOD NEUROLOGICAL BOWEL NUTRITION STATUS      Continent Diet (See  DC summary)  AMBULATORY STATUS COMMUNICATION OF NEEDS Skin   Limited Assist Verbally Surgical wounds,Skin abrasions (Amputation of L great toe, Open wound on L 2nd toe)                       Personal Care Assistance Level of Assistance  Bathing,Feeding,Dressing Bathing Assistance: Limited assistance Feeding assistance: Independent Dressing Assistance: Limited assistance     Functional Limitations Info  Sight,Hearing,Speech Sight Info: Impaired Hearing Info: Impaired Speech Info: Adequate    SPECIAL CARE FACTORS FREQUENCY  PT (By licensed PT),OT (By licensed OT)     PT Frequency: 5x week OT Frequency: 5x week            Contractures Contractures Info: Not present    Additional Factors Info  Code Status,Allergies,Insulin Sliding Scale Code Status Info: Full Allergies Info: Oxycodone- Acetaminophen   Insulin Sliding Scale Info: Insulin Aspart (Novolog) 0-9 U 3x daily; Insulin Glargine (Lantus) 13U @ bedtime       Current Medications (08/22/2020):  This is the current hospital active medication list Current Facility-Administered Medications  Medication Dose Route Frequency Provider Last Rate Last Admin  . acetaminophen (TYLENOL) tablet 650 mg  650 mg Oral Q6H PRN Katsadouros, Vasilios, MD      . albuterol (PROVENTIL) (2.5 MG/3ML) 0.083% nebulizer solution 2.5 mg  2.5 mg Nebulization Q2H PRN [name], Rylee, MD      . ampicillin-sulbactam (UNASYN) 1.5 g in sodium chloride 0.9 % 100 mL IVPB  1.5 g Intravenous Q6H Gilles Chiquito B, MD 200 mL/hr at 08/22/20 0435 1.5 g at 08/22/20 0435  . aspirin EC tablet 81 mg  81 mg Oral Daily [name], Rylee, MD   81 mg at 08/21/20 1040  . Chlorhexidine Gluconate Cloth 2 % PADS 6 each  6 each Topical Q0600 Mitzi Hansen, MD   6 each at 08/18/20 0721  . Chlorhexidine Gluconate Cloth 2 % PADS 6 each  6 each Topical Q0600 Janalee Dane, PA-C   6 each at 08/18/20 9379  . Chlorhexidine Gluconate Cloth 2 % PADS 6 each  6 each  Topical Q0600 Janalee Dane, PA-C   6 each at 08/19/20 1100  . doxercalciferol (HECTOROL) injection 3 mcg  3 mcg Intravenous Q M,W,F-HD Collins, Hervey Ard, PA-C   3 mcg at 08/20/20 0240  . feeding supplement (NEPRO CARB STEADY) liquid 237 mL  237 mL Oral BID BM Collins, Samantha G, PA-C   237 mL at 08/21/20 1819  . heparin injection 5,000 Units  5,000 Units Subcutaneous Q8H [name], Rylee, MD   5,000 Units at 08/22/20 0645  . insulin aspart (novoLOG) injection 0-9 Units  0-9 Units Subcutaneous TID WC Mitzi Hansen, MD   2 Units at 08/22/20 0816  . insulin glargine (LANTUS) injection 13 Units  13 Units Subcutaneous QHS Mitzi Hansen, MD   13 Units at 08/21/20 2257  . multivitamin (RENA-VIT) tablet 1 tablet  1 tablet Oral QHS Sid Falcon, MD   1 tablet at 08/21/20 2257  . nicotine (NICODERM CQ - dosed in mg/24 hours) patch 21 mg  21 mg Transdermal Daily [name], Rylee, MD   21 mg at 08/19/20 1004  . pantoprazole (PROTONIX) EC tablet 40 mg  40 mg Oral Daily [name], Rylee, MD   40 mg at 08/21/20 1040  . rosuvastatin (CRESTOR) tablet 20 mg  20 mg Oral Daily [name], Rylee, MD   20 mg at 08/21/20 1040  . sevelamer carbonate (RENVELA) tablet 3,200 mg  3,200 mg Oral TID WC [name], Rylee, MD   3,200 mg at 08/20/20 1745     Discharge Medications: Please see discharge summary for a list of discharge medications.  Relevant Imaging Results:  Relevant Lab Results:   Additional Information SS# [place], [place]

## 2020-08-22 NOTE — Progress Notes (Signed)
Subjective/Interm history: Afebrile overnight but developed another episode of hypotension (72/40). He endorsed feeling some lightheadedness at that time. Received a fluid bolus with resolution of hypotension.  Pt reports feeling well this morning. No acute complaints.  Objective:  Vital signs in last 24 hours: Vitals:   08/22/20 0033 08/22/20 0217 08/22/20 0615 08/22/20 0742  BP: (!) 96/50 (!) 98/49 (!) 106/39 (!) 113/59  Pulse: 66 67 64 67  Resp: 17  17 18   Temp: 98.5 F (36.9 C)  98.4 F (36.9 C) 98.3 F (36.8 C)  TempSrc: Oral   Oral  SpO2: 99% 99% 100% 99%  Weight:      Height:       General: chronically ill appearing, catchetic  Cardiac: RRR, extremities warm Pulm: breathing comfortably on 2L with oxygen saturations 98-100%. Lung sounds diminished in the apicies. 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  He was evaluated by PT/OT who recommended CIR for ongoing rehabilitation. He is now awaiting evaluation and insurance authorization with them.   SIRS (resolved). No further febrile episodes. No growth on blood cultures. Low suspicion for sepsis. Suspect hypotension is secondary to increased net UF with HD and starting home medications that he was not likely compliant with prior to admission. Hypotension resolved this morning -will continue unasyn through tomorrow and if still no growth on blood  cultures, will discontinue after 3 days total -follow culture results. -ok to give small 500cc fluid bolus for MAPs <65  ESRD on HD, acute hypoxic respiratory failure, hypotension. Now below EDW and having recurrent hypotension.  Remains on supplemental oxygen however oxygen saturations are in the high 90s so suspect this could be weaned off. Suspect a component of COPD with hyperinflation on imaging so O2 saturation goal should be >88%. Management per nephrology. -planning to go for net even tomorrow at HD -home antihypertensives on hold  Physical deconditioning -plan will be to get re-evaluated by CIR however will need to defer discharge until sepsis is ruled out and hemodynamically stable. If not thought to be a candidate by CIR, then will work on SNF for acute rehab - continue PT/OT while awaiting placement  #Insulin dependent type 2 diabetes. Fasting glucose 163 this morning.  -continue lantus 13U daily with SSI  #Peripheral arterial disease with chronic left 2nd toe wound. Does not appear infected. Follows with podiatry.  -Continue antiplatelet therapy with aspirin.  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 euvolumic on exam #Chronic hypertension, hyperlipidemia, atherosclerotic disease.  - continue holding metoprolol, amlodipine and hydralazine  - continue home Asprin, crestor  #Mediastinal and hilar adenopathy as noted on chest CT from admission #Tobacco use disorder--50 pack year smoking history -PET scan to be arranged after discharge based on CT report recommendations  #Acute hypoxic respiratory failure (resolved) secondary to volume overload in the setting of missed HD session and CHF.    Best Practice: CODE: FULL  VTE prophylaxis: heparin 5000U q8h Dispo: CIR vs SNF. Medically stable for discharge  Mitzi Hansen, MD Internal Medicine Resident PGY-2 [facility] Pager: [phone] After 5pm on weekdays and 1pm on weekends: On  Call pager [phone] 08/22/2020 11:23 AM

## 2020-08-23 ENCOUNTER — Inpatient Hospital Stay (HOSPITAL_COMMUNITY): Payer: Medicare (Managed Care)

## 2020-08-23 LAB — GLUCOSE, CAPILLARY
Glucose-Capillary: 139 mg/dL — ABNORMAL HIGH (ref 70–99)
Glucose-Capillary: 178 mg/dL — ABNORMAL HIGH (ref 70–99)
Glucose-Capillary: 224 mg/dL — ABNORMAL HIGH (ref 70–99)
Glucose-Capillary: 197 mg/dL — ABNORMAL HIGH (ref 70–99)

## 2020-08-23 LAB — CBC
HCT: 31.3 % — ABNORMAL LOW (ref 39.0–52.0)
Hemoglobin: 10 g/dL — ABNORMAL LOW (ref 13.0–17.0)
MCH: 26.6 pg (ref 26.0–34.0)
MCHC: 31.9 g/dL (ref 30.0–36.0)
MCV: 83.2 fL (ref 80.0–100.0)
Platelets: 251 10*3/uL (ref 150–400)
RBC: 3.76 MIL/uL — ABNORMAL LOW (ref 4.22–5.81)
RDW: 13.8 % (ref 11.5–15.5)
WBC: 8.9 10*3/uL (ref 4.0–10.5)
nRBC: 0 % (ref 0.0–0.2)

## 2020-08-23 LAB — RENAL FUNCTION PANEL
Albumin: 2.3 g/dL — ABNORMAL LOW (ref 3.5–5.0)
Anion gap: 13 (ref 5–15)
BUN: 61 mg/dL — ABNORMAL HIGH (ref 8–23)
CO2: 26 mmol/L (ref 22–32)
Calcium: 9.4 mg/dL (ref 8.9–10.3)
Chloride: 96 mmol/L — ABNORMAL LOW (ref 98–111)
Creatinine, Ser: 8.85 mg/dL — ABNORMAL HIGH (ref 0.61–1.24)
GFR, Estimated: 6 mL/min — ABNORMAL LOW (ref >60)
Glucose, Bld: 185 mg/dL — ABNORMAL HIGH (ref 70–99)
Phosphorus: 4.2 mg/dL (ref 2.5–4.6)
Potassium: 4.2 mmol/L (ref 3.5–5.1)
Sodium: 135 mmol/L (ref 135–145)

## 2020-08-23 MED ORDER — SODIUM CHLORIDE 0.9 % IV SOLN
1.5000 g | Freq: Two times a day (BID) | INTRAVENOUS | Status: DC
  Administered 2020-08-23 – 2020-08-24 (×2): 1.5 g via INTRAVENOUS
  Filled 2020-08-23 (×4): qty 4

## 2020-08-23 MED ORDER — DOXERCALCIFEROL 4 MCG/2ML IV SOLN
INTRAVENOUS | Status: AC
  Administered 2020-08-23: 3 ug via INTRAVENOUS
  Filled 2020-08-23: qty 2

## 2020-08-23 MED ORDER — IOHEXOL 9 MG/ML PO SOLN
ORAL | Status: AC
  Administered 2020-08-23: 500 mL
  Filled 2020-08-23: qty 1000

## 2020-08-23 MED ORDER — IOHEXOL 300 MG/ML  SOLN
100.0000 mL | Freq: Once | INTRAMUSCULAR | Status: AC | PRN
  Administered 2020-08-23: 100 mL via INTRAVENOUS

## 2020-08-23 NOTE — Progress Notes (Addendum)
Occupational Therapy Treatment Patient Details Name: Gary [name] MRN: [id number] DOB: [birth year] Today's Date: 08/23/2020    History of present illness Gary [name] is a 65 year old chronically ill male with ESRD on HD MWF, combined heart failure (recently diagnosed), COPD (per PACE provider), tobacco use disorder, hypertension, hyperlipidemia, uncontrolled diabetes, and PVD s/p right BKA and left first toe. He is presenting for acute dyspnea.  History was obtained from the patient and from PCP at [facility]   OT comments Pt making excellent progress towards OT goals. Pt able to demonstrate improved sitting balance during LB dressing tasks sitting EOB but still may require assist to maintain standing balance for LB dressing around waist. Pt able to progress hallway mobility using RW at min guard. BP readings & HR WFL throughout session (see orthostatic vitals in flowsheet). Plan to see pt for OT session tomorrow before re-assessing DC recommendations to ensure that the progress shown today will be consistent.     Follow Up Recommendations  CIR;Supervision/Assistance - 24 hour    Equipment Recommendations  3 in 1 bedside commode;Other (comment) (for placement over toilet)    Recommendations for Other Services Rehab consult    Precautions / Restrictions Precautions Precautions: Fall;Other (comment) Precaution Comments: hx of R BKA with prosthetic in room, monitor BP & HR Restrictions Weight Bearing Restrictions: No       Mobility Bed Mobility Overal bed mobility: Needs Assistance Bed Mobility: Supine to Sit     Supine to sit: Supervision;HOB elevated     General bed mobility comments: no assist needed    Transfers Overall transfer level: Needs assistance Equipment used: Rolling walker (2 wheeled) Transfers: Sit to/from Stand Sit to Stand: Min guard;+2 safety/equipment         General transfer comment: min guard for sit to stand at bedside using RW. able to power up with  decreased tremors noted    Balance Overall balance assessment: Needs assistance Sitting-balance support: Bilateral upper extremity supported;Feet supported Sitting balance-Leahy Scale: Fair     Standing balance support: Bilateral upper extremity supported;During functional activity Standing balance-Leahy Scale: Poor Standing balance comment: reliant on UE support for mobility, decreasing need for external assist from therapist                           ADL either performed or assessed with clinical judgement   ADL Overall ADL's : Needs assistance/impaired                     Lower Body Dressing: Sitting/lateral leans;Sit to/from stand;Supervision/safety Lower Body Dressing Details (indicate cue type and reason): Supervision to don prosthetic LE, socks, shoes sitting EOB, may still require assist in standing for balance - will further assess             Functional mobility during ADLs: Min guard;Rolling walker General ADL Comments: Pt progressing well today, able to tolerate OOB activities and mobility with RW.     Vision   Vision Assessment?: No apparent visual deficits   Perception     Praxis      Cognition Arousal/Alertness: Awake/alert Behavior During Therapy: WFL for tasks assessed/performed Overall Cognitive Status: Impaired/Different from baseline Area of Impairment: Awareness                           Awareness: Emergent   General Comments: Very pleasant and participatory, shows insight into considerable weakness.  Exercises     Shoulder Instructions       General Comments Assessed BP with transitional movements which were [facility] throughout session. HR stable 70s-80s.    Pertinent Vitals/ Pain       Pain Assessment: No/denies pain  Home Living                                          Prior Functioning/Environment              Frequency  Min 2X/week        Progress Toward Goals  OT  Goals(current goals can now be found in the care plan section)  Progress towards OT goals: Progressing toward goals  Acute Rehab OT Goals Patient Stated Goal: feel better, get strength back OT Goal Formulation: With patient Time For Goal Achievement: 09/01/20 Potential to Achieve Goals: Good ADL Goals Pt Will Perform Grooming: with set-up;standing Pt Will Perform Lower Body Bathing: with supervision;sitting/lateral leans;sit to/from stand Pt Will Perform Lower Body Dressing: with supervision;sitting/lateral leans;sit to/from stand Pt Will Transfer to Toilet: with min guard assist;ambulating Pt Will Perform Toileting - Clothing Manipulation and hygiene: sitting/lateral leans;sit to/from stand;with set-up Pt/caregiver will Perform Home Exercise Program: Increased strength;Both right and left upper extremity;With theraband;Independently;With written HEP provided  Plan Discharge plan remains appropriate    Co-evaluation    PT/OT/SLP Co-Evaluation/Treatment: Yes Reason for Co-Treatment: To address functional/ADL transfers;For patient/therapist safety   OT goals addressed during session: ADL's and self-care      AM-PAC OT "6 Clicks" Daily Activity     Outcome Measure   Help from another person eating meals?: None Help from another person taking care of personal grooming?: A Little Help from another person toileting, which includes using toliet, bedpan, or urinal?: A Lot Help from another person bathing (including washing, rinsing, drying)?: A Lot Help from another person to put on and taking off regular upper body clothing?: A Little Help from another person to put on and taking off regular lower body clothing?: A Little 6 Click Score: 17    End of Session Equipment Utilized During Treatment: Gait belt;Rolling walker  OT Visit Diagnosis: Unsteadiness on feet (R26.81);Other abnormalities of gait and mobility (R26.89);Muscle weakness (generalized) (M62.81)   Activity Tolerance  Patient tolerated treatment well   Patient Left in chair;with call bell/phone within reach;with chair alarm set   Nurse Communication Mobility status        Time: 3220-2542 OT Time Calculation (min): 23 min  Charges: OT General Charges $OT Visit: 1 Visit OT Treatments $Therapeutic Activity: 8-22 mins  Malachy Chamber, OTR/L Acute Rehab Services Office: [phone]   Layla Maw 08/23/2020, 2:49 PM

## 2020-08-23 NOTE — Progress Notes (Signed)
Inpatient Rehab Admissions Coordinator:   Opened case for insurance authorization. Will continue to follow for progress with therapies and determination from insurance.   Shann Medal, PT, DPT Admissions Coordinator [phone] 08/23/20  11:56 AM

## 2020-08-23 NOTE — Consult Note (Signed)
WOC Nurse Consult Note: Patient receiving care in [facility] [phone]. Reason for Consult: pressure ulcer Wound type: possible abscess to coccyx area and buttocks Pressure Injury POA: Yes/No/NA Measurement: coccyx opening measures 2.6 cm x 0.8 cm x 2.6 cm.  There is a 5.2 cm tunnel at 12 o'clock extending up along the spine. There is undermining towards the right buttock. The right buttock area is darkened and hard. The patient has pain to the bilateral buttocks Wound bed: cannot be visualized. Using a suture removal kit, I trimmed off a stringy plug of tan non-viable tissue hanging out of the opening. Drainage (amount, consistency, odor) heavy tan drainage expressed with palpation to bilateral buttocks. Positive for foul odor. Periwound: intact and as described above Dressing procedure/placement/frequency: Perform daily: Using a cotton tipped applicator, insert 1/4 inch Iodoform packing Kellie Simmering [phone]) into the coccyx wound. The wound tunnels up the spine for 5.2 cm, and undermining over towards the right buttock. Be sure to insert the Iodoform packing into these areas. Cover with a foam dressing. Change daily. Dr. Darrick Meigs at bedside at my request to view wound and discuss POC. I have recommended a surgical consult. [place] nurse will not follow at this time.  Please re-consult the [place] team if needed.  Val Riles, RN, MSN, CWOCN, CNS-BC, pager [phone]

## 2020-08-23 NOTE — Progress Notes (Signed)
Physical Therapy Treatment Patient Details Name: JAQUAVIAN FIRKUS MRN: [id number] DOB: May 13, 1955 Today's Date: 08/23/2020    History of Present Illness Gary [name] is a 65 year old chronically male who presented to ED for acute dyspnea. Admitted on 08/17/20 with acute respiratory failure. 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    PT Comments    Pt received in bed, very cooperative and pleasant. Significant improvements observed. Pt progressing towards PT goals. Better sitting and standing balance. Noted to be less tremulous with only minimal tremors upon standing. Able to progress to ambulation with RW at min guard level. No overt LOB or physical assist given. VSS on RA with no evidence of orthostatics. Chair follow for safety, but pt did not need to use it. Left in chair with all needs met, call bell within reach, and chair alarm active. Would like to see consistency in pt's ability to mobilize. Will continue to follow acutely.     Follow Up Recommendations  CIR;Supervision for mobility/OOB     Equipment Recommendations  Rolling walker with 5" wheels;Wheelchair (measurements PT);3in1 (PT);Wheelchair cushion (measurements PT)    Recommendations for Other Services       Precautions / Restrictions Precautions Precautions: Fall;Other (comment) Precaution Comments: hx of R BKA with prosthetic in room, monitor BP & HR Restrictions Weight Bearing Restrictions: No    Mobility  Bed Mobility Overal bed mobility: Needs Assistance Bed Mobility: Supine to Sit     Supine to sit: Supervision;HOB elevated     General bed mobility comments: no assist needed    Transfers Overall transfer level: Needs assistance Equipment used: Rolling walker (2 wheeled) Transfers: Sit to/from [facility] Sit to  Stand: Min guard;+2 safety/equipment Stand pivot transfers: Min guard;+2 safety/equipment       General transfer comment: min guard for sit to stand at bedside using RW. able to power up without physical assistnace  Ambulation/Gait Ambulation/Gait assistance: Min guard Gait Distance (Feet): 150 Feet Assistive device: Rolling walker (2 wheeled) Gait Pattern/deviations: Step-through pattern;Decreased stride length Gait velocity: decreased   General Gait Details: min guard for safety   Stairs             Wheelchair Mobility    Modified Rankin (Stroke Patients Only)       Balance Overall balance assessment: Needs assistance Sitting-balance support: Feet supported;No upper extremity supported Sitting balance-Leahy Scale: Good Sitting balance - Comments: fair sitting balance, able to don prostethetic sitting at EOB with min guard for safety   Standing balance support: Bilateral upper extremity supported;During functional activity Standing balance-Leahy Scale: Poor Standing balance comment: reliant on UE support for mobility                            Cognition Arousal/Alertness: Awake/alert Behavior During Therapy: WFL for tasks assessed/performed Overall Cognitive Status: Impaired/Different from baseline Area of Impairment: Following commands;Awareness                       Following Commands: Follows multi-step commands with increased time   Awareness: Emergent   General Comments: Very pleasant and participatory, shows insight into considerable weakness.      Exercises      General Comments General comments (skin integrity, edema, etc.): VSS on RA      Pertinent  Vitals/Pain Pain Assessment: No/denies pain    Home Living                      Prior Function            PT Goals (current goals can now be found in the care plan section) Acute Rehab PT Goals Patient Stated Goal: feel better, get strength back PT Goal  Formulation: With patient Time For Goal Achievement: 09/01/20 Potential to Achieve Goals: Good    Frequency    Min 3X/week      PT Plan Current plan remains appropriate    Co-evaluation PT/OT/SLP Co-Evaluation/Treatment: Yes Reason for Co-Treatment: For patient/therapist safety;To address functional/ADL transfers PT goals addressed during session: Mobility/safety with mobility;Proper use of DME;Balance OT goals addressed during session: ADL's and self-care      AM-PAC PT "6 Clicks" Mobility   Outcome Measure  Help needed turning from your back to your side while in a flat bed without using bedrails?: A Little Help needed moving from lying on your back to sitting on the side of a flat bed without using bedrails?: A Little Help needed moving to and from a bed to a chair (including a wheelchair)?: A Little Help needed standing up from a chair using your arms (e.g., wheelchair or bedside chair)?: A Little Help needed to walk in hospital room?: A Little Help needed climbing 3-5 steps with a railing? : A Lot 6 Click Score: 17    End of Session Equipment Utilized During Treatment: Gait belt Activity Tolerance: Patient tolerated treatment well Patient left: with call bell/phone within reach;in chair;with chair alarm set Nurse Communication: Mobility status;Need for lift equipment PT Visit Diagnosis: Unsteadiness on feet (R26.81);Difficulty in walking, not elsewhere classified (R26.2);Muscle weakness (generalized) (M62.81)     Time:  -     Charges:                        Rosita Kea, SPT

## 2020-08-23 NOTE — Progress Notes (Signed)
Pharmacy Antibiotic Note  Gary [name] is a 65 y.o. male admitted on 08/17/2020  Acute respiratory failure.    Pharmacy has been consulted for Unasyn dosing.   ESRD on HD MWF   Plan: Unasyn 1.5 g IV q12h  Monitor clinical status,  culture results daily.    Height: 5\' 7"  (170.2 cm) Weight: 58 kg (127 lb 13.9 oz) IBW/kg (Calculated) : 66.1  Temp (24hrs), Avg:98.6 F (37 C), Min:98.3 F (36.8 C), Max:98.9 F (37.2 C)  Recent Labs  Lab 08/17/20 1341 08/17/20 1446 08/17/20 1740 08/18/20 0043 08/18/20 0235 08/19/20 0345 08/20/20 0234 08/20/20 0840 08/21/20 0048 08/21/20 0442 08/21/20 0713 08/22/20 0320 08/23/20 0356 08/23/20 0903  WBC 13.2*  --   --  12.2*  --   --   --  13.2* 14.3*  --   --   --   --  8.9  CREATININE 11.89*  --    < > 7.33*  --  5.05* 7.24*  --  5.16*  --   --  6.79* 8.85*  --   LATICACIDVEN  --  2.7*  --  3.0* 2.4*  --   --   --   --  1.6 1.3  --   --   --    < > = values in this interval not displayed.    Estimated Creatinine Clearance: 6.9 mL/min (A) (by C-G formula based on SCr of 8.85 mg/dL (H)).    Allergies  Allergen Reactions  . Oxycodone-Acetaminophen Rash and Other (See Comments)    Macular papular rash after two days on Oxy/APAP resolved with d/c of drug.    Antimicrobials this admission: Unasyn  4/25>>  Dose adjustments this admission: n/a  Microbiology results: 4/25 Blood cx:  ngtd 4/19 Covid: neg; flu: neg  Thank you for allowing pharmacy to be a part of this patient's care.  Nicole Cella, RPh Clinical Pharmacist  08/23/2020 2:55 PM

## 2020-08-23 NOTE — Discharge Summary (Signed)
Name: Gary [name] MRN: [id number] DOB: January 13, 1956 65 y.o. PCP: Janifer Adie, MD  Date of Admission: 08/17/2020  1:31 PM Date of Discharge: 08/24/2020 Attending Physician: Sid Falcon, MD  Discharge Diagnosis: 1. Acute hypoxic respiratory failure 2. ESRD on HD MWF 3. Mediastinal and hilar adenopathy 4. Tobacco use disorder 5. Physical deconditioning 6. Insulin dependent type 2 diabetes mellitus 7. Moderate malnutrition 8. Peripheral arterial disease 9. History of right BKA 10. 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 Chronic combined heart failure 12. Left bundle branch block 13. Hyperlipidemia 14. History of hypertension   Discharge Medications: Allergies as of 08/24/2020      Reactions   Oxycodone-acetaminophen Rash, Other (See Comments)   Macular papular rash after two days on Oxy/APAP resolved with d/c of drug.      Medication List    STOP taking these medications   amLODipine 10 MG tablet Commonly known as: NORVASC   hydrALAZINE 100 MG tablet Commonly known as: APRESOLINE   metoprolol succinate 100 MG 24 hr tablet Commonly known as: TOPROL-XL     TAKE these medications   acetaminophen 500 MG tablet Commonly known as: TYLENOL Take 1,000 mg by mouth 3 (three) times daily as needed for mild pain.   amoxicillin-clavulanate 500-125 MG tablet Commonly known as: Augmentin Take 1 tablet (500 mg total) by mouth 2 (two) times daily for 4 days.   aspirin EC 81 MG tablet Take 81 mg by mouth daily.   bisacodyl 5 MG EC tablet Commonly known as: DULCOLAX Take 5 mg by mouth daily.   famotidine 40 MG tablet Commonly known as: PEPCID Take 40 mg by mouth at bedtime.   feeding supplement (NEPRO CARB STEADY) Liqd Take 240 mLs by mouth daily.   furosemide 80 MG tablet Commonly known as: LASIX Take 80-120 mg by mouth See admin instructions. 120mg  in the morning 80mg  in the afternoon   metoCLOPramide 5 MG tablet Commonly known as: REGLAN Take 5 mg by mouth See  admin instructions. 5mg  daily as bedtime And 5mg  three times daily as needed for nausea vomitting   multivitamin Tabs tablet Take 1 tablet by mouth daily.   pantoprazole 40 MG tablet Commonly known as: PROTONIX Take 1 tablet (40 mg total) by mouth daily.   polyethylene glycol 17 g packet Commonly known as: MIRALAX / GLYCOLAX Take 17 g by mouth daily.   Repatha Pushtronex System 420 MG/3.5ML Soct Generic drug: Evolocumab with Infusor Inject 420 mg into the skin every 30 (thirty) days.   rosuvastatin 20 MG tablet Commonly known as: CRESTOR Take 20 mg by mouth daily.   senna-docusate 8.6-50 MG tablet Commonly known as: Senokot-S Take 2 tablets by mouth 2 (two) times daily.   Trulicity 2.54 YH/0.6CB Sopn Generic drug: Dulaglutide Inject 0.75 mg into the skin once a week.   Vitamin D (Ergocalciferol) 1.25 MG (50000 UNIT) Caps capsule Commonly known as: DRISDOL Take 50,000 Units by mouth every 30 (thirty) days.            Discharge Care Instructions  (From admission, onward)         Start     Ordered   08/24/20 0000  Discharge wound care:       Comments: Change packing and apply foam dressing daily   08/24/20 1525          Disposition and follow-up:   Gary [name] was discharged from [facility] in Stable condition.  At the hospital follow up visit  please address:  Sacral pressure ulcer No abscess shown on CT abdomen pelvis.  Patient was on 3 days of Unasyn.  Continue 4 days of Augmentin after discharge. -Patient will need dressing changed to daily -Encourage PT and offloading to promote healing  Mediastinal and hilar adenopathy incidentally noted on chest CT on admission. 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 He developed hypotension with increased volume removal from HD due to a decreased estimate in his dry weight.  -amlodipine, hydralazine and metoprolol held at discharge -re-evaluate at time of follow up  Labs / imaging needed at time of follow-up: CBC, BMP  Pending labs/ test needing follow-up: none  Follow-up Appointments:   Hospital Course:  Gary [name] is a 65 year old male with ESRD on HD MWF, combined heart failure (EF 20-25%), COPD, tobacco use disorder, hypertension, hyperlipidemia, uncontrolled diabetes, and PVD s/p right BKA with chronic left 2nd toe wound. He resided at home prior to admission.  He was brought to [facility] ED on 08/17/20 via EMS after being found to be short of breath and hypoxic (O2 sats in the 70s) during a podiatry appointment.  Chest XR in the ED showed pulmonary edema. He had noted that he had missed his last HD session due to a transportation issue. Symptoms quickly resolved with HD.  His hospitalization was complicated by the development of hypotension in the setting of increased volume being pulled off during dialysis. He did undergo an infectious work up, including repeat chest xray and blood cultures, which were unremarkable.  Patient developed a fever of 101.7 on day 4 of admission.  We also found a sacral wound with a 5.2 cm tunnel at 12 o'clock extending along the spine.  CT abdomen pelvis showed a track like structure extending from the right perianal region into the medial right gluteal soft tissues measured 2.5 cm in length.  No drainable fluid collection or abscess identified.  The wound was packed and changed dressing daily.  Patient was on 3 days of Unasyn and will continue 4 days of Augmentin after discharge.  I have spoken to his provider at [facility].  They will change his dressing daily at the clinic and also at physical therapy center.  He will go to dialysis tomorrow as scheduled.  Subjectives No acute events  overnight. During evaluation this morning, patient states he is ready to get out of the hospital. He lives at home with his wife who is healthy and helps him get around. He uses a cane while ambulating. He reports staying inside is frustrating, when he's at home he enjoys getting outside in the sunshine. When asked about wound found on his sacrum, he reports he had one a long time ago but not since.   Discharge Vitals:   BP (!) 113/57   Pulse 75   Temp 98.3 F (36.8 C) (Oral)   Resp 16   Ht 5\' 7"  (1.702 m)   Wt 58 kg   SpO2 100%   BMI 20.03 kg/m   Physical Exam Constitutional:      General: He is not in acute distress.    Appearance: He is not toxic-appearing.     Comments: frail  HENT:     Head: Normocephalic.  Eyes:     General: No scleral  icterus.       Right eye: No discharge.        Left eye: No discharge.     Conjunctiva/sclera: Conjunctivae normal.  Pulmonary:     Effort: Pulmonary effort is normal. No respiratory distress.  Skin:    Comments: Sacral pressure wound noted.  No pain to palpation.  No discharge.  Does not appear infected. Wound is clean  Neurological:     Mental Status: He is alert.  Psychiatric:        Mood and Affect: Mood normal.        Thought Content: Thought content normal.        Judgment: Judgment normal.     Pertinent Labs, Studies, and Procedures:  CBC Latest Ref Rng & Units 08/24/2020 08/23/2020 08/21/2020  WBC 4.0 - 10.5 K/uL 8.9 8.9 14.3(H)  Hemoglobin 13.0 - 17.0 g/dL 10.8(L) 10.0(L) 12.3(L)  Hematocrit 39.0 - 52.0 % 33.5(L) 31.3(L) 39.2  Platelets 150 - 400 K/uL 256 251 279   BMP Latest Ref Rng & Units 08/24/2020 08/23/2020 08/22/2020  Glucose 70 - 99 mg/dL 161(H) 185(H) 247(H)  BUN 8 - 23 mg/dL 26(H) 61(H) 44(H)  Creatinine 0.61 - 1.24 mg/dL 5.07(H) 8.85(H) 6.79(H)  Sodium 135 - 145 mmol/L 131(L) 135 132(L)  Potassium 3.5 - 5.1 mmol/L 4.1 4.2 4.0  Chloride 98 - 111 mmol/L 94(L) 96(L) 96(L)  CO2 22 - 32 mmol/L 27 26 26   Calcium 8.9  - 10.3 mg/dL 8.6(L) 9.4 9.3   CT Angio Chest PE W and/or Wo Contrast  Result Date: 08/17/2020 CLINICAL DATA:  Shortness of breath. EXAM: CT ANGIOGRAPHY CHEST WITH CONTRAST TECHNIQUE: Multidetector CT imaging of the chest was performed using the standard protocol during bolus administration of intravenous contrast. Multiplanar CT image reconstructions and MIPs were obtained to evaluate the vascular anatomy. CONTRAST:  43mL OMNIPAQUE IOHEXOL 350 MG/ML SOLN COMPARISON:  None. FINDINGS: Cardiovascular: The heart is enlarged. No pericardial effusion. Significant age advanced atherosclerotic calcification involving the thoracic aorta, branch vessels and in particular the coronary arteries. There is significant reflux of contrast down the IVC and into the hepatic veins suggesting tricuspid regurgitation. The pulmonary arterial tree is well opacified. No filling defects to suggest pulmonary embolism. Mediastinum/Nodes: Mediastinal and hilar lymphadenopathy. The esophagus is grossly normal. Lungs/Pleura: Moderate peribronchial thickening most notably in the lower lung zones bilaterally suggesting bronchitis. Patchy mosaic pattern of ground-glass attenuation in the lungs. This can be seen with reactive airways disease/asthma respiratory bronchiolitis, cryptogenic organizing pneumonia or hypersensitivity pneumonitis. Atypical/viral pneumonia would be another possibility. No focal airspace consolidation/typical pneumonia. No pleural effusions. Upper Abdomen: No acute upper abdominal findings. Advanced vascular calcifications likely related to the patient's diabetes. Musculoskeletal: No significant bony findings. Review of the MIP images confirms the above findings. IMPRESSION: 1. No CT findings for pulmonary embolism. 2. Significant age advanced atherosclerotic calcification involving the thoracic aorta, branch vessels and in particular the coronary arteries. 3. Significant reflux of contrast down the IVC and into the  hepatic veins suggesting tricuspid regurgitation. 4. Moderate peribronchial thickening most notably in the lower lung zones bilaterally suggesting bronchitis. 5. Patchy mosaic pattern of ground-glass attenuation in the lungs. This can be seen with reactive airways disease/asthma respiratory bronchiolitis, cryptogenic organizing pneumonia or hypersensitivity pneumonitis. Atypical/viral pneumonia would be another possibility. 6. Mediastinal and hilar lymphadenopathy. Patient may need further evaluation with PET-CT. 7. Aortic atherosclerosis. Aortic Atherosclerosis (ICD10-I70.0). Electronically Signed   By: Marijo Sanes M.D.   On: 08/17/2020 17:18   DG Chest [facility]  1 View  Result Date: 08/17/2020 CLINICAL DATA:  Shortness of breath. EXAM: PORTABLE CHEST 1 VIEW COMPARISON:  June 12, 2018. FINDINGS: Stable cardiomediastinal silhouette. No pneumothorax or pleural effusion is noted. Lungs are clear. Bony thorax is unremarkable. IMPRESSION: No active disease. Aortic Atherosclerosis (ICD10-I70.0). Electronically Signed   By: Marijo Conception M.D.   On: 08/17/2020 14:35   CT abdomen/pelvis IMPRESSION: 1. No acute intra-abdominal or pelvic pathology. No bowel obstruction. Normal appendix. 2. Moderate bilateral renal atrophy with findings suggestive of renal hypoperfusion. 3. 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 The graft is patent. 4. Mild cardiomegaly with findings of CHF and trace bilateral pleural effusions. 5. Mild anasarca. 6. Aortic Atherosclerosis (ICD10-I70.0). ADDENDUM: There is a track like structure extending from the right perianal region into the medial right gluteal soft tissues. This measures approximately 2.5 cm in length. Small amount of fluid and tiny pocket of air noted within this track. No drainable fluid collection or abscess identified.  Discharge Instructions    Call MD for:  persistant nausea and  vomiting   Complete by: As directed    Call MD for:  severe uncontrolled pain   Complete by: As directed    Call MD for:  temperature >100.4   Complete by: As directed    Diet - low sodium heart healthy   Complete by: As directed    Discharge instructions   Complete by: As directed    Gary [name],  It is a pleasure taking care of you during this admission.  You were hospitalized for shortness of breath that resolved with hemodialysis.   We also found a pressure wound on your backside.  This is likely due to prolonged immobility.  I have talked to the provider at [facility] and they will help you change the wound dressing daily.  We have treated the wound with 4 days of antibiotic.  Please continue taking antibiotics for 4 more days after discharge. Also follow-up with physical therapy to build up your strength.  Please stop taking amlodipine, hydralazine and metoprolol after discharge.  Your blood pressure was low in the hospital while not on this medication.  Your provider will decide when to put you back on these medication.  Please go to your dialysis section tomorrow as scheduled.  Take care,  Dr. Alfonse Spruce   Discharge wound care:   Complete by: As directed    Change packing and apply foam dressing daily   Increase activity slowly   Complete by: As directed       Signed:  Mitzi Hansen, MD Internal Medicine Resident PGY-2 [facility] Pager: [phone] 08/24/2020 3:46 PM

## 2020-08-23 NOTE — Progress Notes (Addendum)
Subjective/Interm history:  No hypotension or fever overnight. Seen prior to HD. No complaints.  Objective:  Vital signs in last 24 hours: Vitals:   08/23/20 0844 08/23/20 0900 08/23/20 0930 08/23/20 1000  BP: 126/61 125/62 (!) 120/55 (!) 114/52  Pulse: 73 70 70 71  Resp:      Temp:      TempSrc:      SpO2:      Weight:      Height:       General: chronically ill appearing, catchetic  Cardiac: extremities warm Pulm: breathing comfortably on room air lying on his back Skin: gluteal cleft tunneling wound toward the 12 o'clock position 5.2''. Induration involving the right medial gluteus. Tan drainage expressed with palpation. Non-edematous.       Assessment/Plan:  Principal Problem:   Acute respiratory failure (HCC) Active Problems:   Tobacco use disorder   Peripheral vascular disease   Acquired absence of right leg below knee (HCC)   ESRD on hemodialysis (HCC)   Elevated troponin   Malnutrition of moderate degree   Mediastinal adenopathy   Hilar adenopathy   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 [facility])  65 year old chronically ill male with ESRD on HD, CHF, +/- COPD who was admitted for acute respiratory failure secondary to volume overload in the setting of missed HD and quickly resolved with undergoing dialysis.  He was evaluated by PT/OT who recommended CIR for ongoing rehabilitation. He is now awaiting evaluation and insurance authorization with them.   Gluteal abscess, sepsis physiology (resolved). Remains afebrile and normotensive. Buttock wound noted today which would be most likely source however blood cultures remain without growth -will continue unasyn -CT a/p ordered to evaluate the degree of the wound -surgery consult once imaging is complete. -will continue to follow culture data -wound care per wound nurse recommendation  ESRD on HD. Now below EDW. Hypotension resolved over the weekend with no HD which supports the hypotension being  related to volume rather than infection Management per nephrology. -planning to go for net even at HD today -home antihypertensives on hold  Physical deconditioning -plan will be to get re-evaluated by CIR however will need to defer discharge until sepsis is ruled out and hemodynamically stable. If not thought to be a candidate by CIR, then will work on SNF for acute rehab - continue PT/OT while awaiting placement  #Insulin dependent type 2 diabetes. Fasting glucose 139 this morning. Post prandial glucoses are up however chart record indicates he has been receiving novolog after eating, rather that prior, so I will not make any changes other that clarifying with nursing staff. -continue lantus 13U daily with SSI  #Peripheral arterial disease with chronic left 2nd toe wound. Does not appear infected. Follows with podiatry.  -Continue antiplatelet therapy with aspirin.  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 euvolumic on exam #Chronic hypertension, hyperlipidemia, atherosclerotic disease.  - continue holding metoprolol, amlodipine and hydralazine  - continue home Asprin, crestor  #Mediastinal and hilar adenopathy as noted on chest CT from admission #Tobacco use disorder--50 pack year smoking history -PET scan to be arranged after discharge based on CT report recommendations  #Acute hypoxic respiratory failure (resolved) secondary to volume overload in the setting of missed HD session and CHF.    Best Practice: CODE: FULL VTE prophylaxis: heparin 5000U q8h Dispo: CIR vs SNF. Medically stable for discharge  Mitzi Hansen, MD Internal Medicine Resident PGY-2 [facility] Pager: [phone] After 5pm  on weekdays and 1pm on weekends: On Call pager [phone] 08/23/2020 10:06 AM

## 2020-08-23 NOTE — Progress Notes (Signed)
OT Cancellation Note  Patient Details Name: Gary [name] MRN: [id number] DOB: [birth year]   Cancelled Treatment:    Reason Eval/Treat Not Completed: Patient at procedure or test/ unavailable Pt off unit for HD this AM. Will follow-up for OT session in PM as schedule permits.  Layla Maw 08/23/2020, 9:51 AM

## 2020-08-23 NOTE — Procedures (Signed)
I was present at this dialysis session, have reviewed the session itself and made  appropriate changes. LUE AVF running Qb 400 no issues.  D/w RN.  Jannifer Hick MD [facility] pager [phone]   08/23/2020, 9:16 AM

## 2020-08-23 NOTE — Progress Notes (Signed)
Subjective: no c/os.  Poor appetite, thinks has lost weight.  Breathing improved.   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, 3:45 hours, BFR 400, DFR 800, EDW 59kg, 2K/2Ca, AVF 15g, heparin 2200 unit bolus Hectorol 6 mcg IV q HD  Problem/Plan: 1. Hypoxia: O2 sat 79% on RAon admit.  Had febrile illness but CXR clear.  Hypoxia has improved with serial HD.  Now below EDW, will need new EDW on DC.   2. Febrile illness/SIRS with hypotension = work-up per admit.  CXR clear. Blood cultures NGTD.  3. ESRD:Dialyzes on MWF schedule, missed HD4/18 due to transportation issues.Continue MWF schedule, HD today.  4. Hypertension: BP normal now, now off all BP meds. 5. Anemia:Hemoglobin 12.3at goal, no ESA indicated at this time.  6. Metabolic bone disease:Calciumelevated to 10.3 during admission, hectorol dec 6 to 64mcg TIW.  Phos at goal; continue binder Renvela 7. Nutrition:ALB 2.3renal/carb modified diet.started  protein supplement 8.    Deconditioning = noted awaiting rehab/CIR versus NHP 9.    Diabetes mellitus: Management per admitting team  Jannifer Hick MD [facility] Pager [phone]  Labs: Basic Metabolic Panel: Recent Labs  Lab 08/21/20 0048 08/22/20 0320 08/23/20 0356  NA 133* 132* 135  K 4.0 4.0 4.2  CL 92* 96* 96*  CO2  28 26 26   GLUCOSE 171* 247* 185*  BUN 24* 44* 61*  CREATININE 5.16* 6.79* 8.85*  CALCIUM 9.3 9.3 9.4  PHOS 3.0 3.7 4.2   Liver Function Tests: Recent Labs  Lab 08/21/20 0048 08/22/20 0320 08/23/20 0356  ALBUMIN 2.7* 2.3* 2.3*   No results for input(s): LIPASE, AMYLASE in the last 168 hours. No results for input(s): AMMONIA in the last 168 hours. CBC: Recent Labs  Lab 08/17/20 1341 08/18/20 0043 08/20/20 0840 08/21/20 0048  WBC 13.2* 12.2* 13.2* 14.3*  NEUTROABS 10.8*  --   --  11.9*  HGB 13.6 11.1* 11.2* 12.3*  HCT 42.0 34.8* 35.6* 39.2  MCV 83.2 82.9 83.6 84.5  PLT 246 196 276 279   Cardiac Enzymes: No results for input(s): CKTOTAL, CKMB, CKMBINDEX, TROPONINI in the last 168 hours. CBG: Recent Labs  Lab 08/22/20 0638 08/22/20 1139 08/22/20 1722 08/22/20 2155 08/23/20 0634  GLUCAP 163* 221* 185* 242* 139*    Studies/Results: No results found. Medications: . ampicillin-sulbactam (UNASYN) IV 1.5 g (08/23/20 0635)   . aspirin EC  81 mg Oral Daily  . Chlorhexidine Gluconate Cloth  6 each Topical Q0600  . Chlorhexidine Gluconate Cloth  6 each Topical Q0600  . Chlorhexidine Gluconate Cloth  6 each Topical Q0600  . doxercalciferol  3 mcg Intravenous Q M,W,F-HD  . feeding supplement (NEPRO CARB STEADY)  237 mL Oral BID BM  . heparin  5,000  Units Subcutaneous Q8H  . insulin aspart  0-9 Units Subcutaneous TID WC  . insulin glargine  13 Units Subcutaneous QHS  . multivitamin  1 tablet Oral QHS  . nicotine  21 mg Transdermal Daily  . pantoprazole  40 mg Oral Daily  . rosuvastatin  20 mg Oral Daily  . sevelamer carbonate  3,200 mg Oral TID WC

## 2020-08-23 NOTE — Plan of Care (Signed)
  Problem: Education: Goal: Knowledge of General Education information will improve Description Including pain rating scale, medication(s)/side effects and non-pharmacologic comfort measures Outcome: Progressing   

## 2020-08-24 ENCOUNTER — Other Ambulatory Visit (HOSPITAL_COMMUNITY): Payer: Self-pay

## 2020-08-24 DIAGNOSIS — I10 Essential (primary) hypertension: Secondary | ICD-10-CM

## 2020-08-24 DIAGNOSIS — L89109 Pressure ulcer of unspecified part of back, unspecified stage: Secondary | ICD-10-CM

## 2020-08-24 DIAGNOSIS — R590 Localized enlarged lymph nodes: Secondary | ICD-10-CM

## 2020-08-24 DIAGNOSIS — L97529 Non-pressure chronic ulcer of other part of left foot with unspecified severity: Secondary | ICD-10-CM

## 2020-08-24 DIAGNOSIS — E119 Type 2 diabetes mellitus without complications: Secondary | ICD-10-CM

## 2020-08-24 DIAGNOSIS — Z79899 Other long term (current) drug therapy: Secondary | ICD-10-CM

## 2020-08-24 DIAGNOSIS — L89159 Pressure ulcer of sacral region, unspecified stage: Secondary | ICD-10-CM

## 2020-08-24 DIAGNOSIS — J9601 Acute respiratory failure with hypoxia: Secondary | ICD-10-CM

## 2020-08-24 LAB — CBC
HCT: 33.5 % — ABNORMAL LOW (ref 39.0–52.0)
Hemoglobin: 10.8 g/dL — ABNORMAL LOW (ref 13.0–17.0)
MCH: 26.3 pg (ref 26.0–34.0)
MCHC: 32.2 g/dL (ref 30.0–36.0)
MCV: 81.5 fL (ref 80.0–100.0)
Platelets: 256 10*3/uL (ref 150–400)
RBC: 4.11 MIL/uL — ABNORMAL LOW (ref 4.22–5.81)
RDW: 13.7 % (ref 11.5–15.5)
WBC: 8.9 10*3/uL (ref 4.0–10.5)
nRBC: 0 % (ref 0.0–0.2)

## 2020-08-24 LAB — RENAL FUNCTION PANEL
Albumin: 2.3 g/dL — ABNORMAL LOW (ref 3.5–5.0)
Anion gap: 10 (ref 5–15)
BUN: 26 mg/dL — ABNORMAL HIGH (ref 8–23)
CO2: 27 mmol/L (ref 22–32)
Calcium: 8.6 mg/dL — ABNORMAL LOW (ref 8.9–10.3)
Chloride: 94 mmol/L — ABNORMAL LOW (ref 98–111)
Creatinine, Ser: 5.07 mg/dL — ABNORMAL HIGH (ref 0.61–1.24)
GFR, Estimated: 12 mL/min — ABNORMAL LOW (ref >60)
Glucose, Bld: 161 mg/dL — ABNORMAL HIGH (ref 70–99)
Phosphorus: 3.7 mg/dL (ref 2.5–4.6)
Potassium: 4.1 mmol/L (ref 3.5–5.1)
Sodium: 131 mmol/L — ABNORMAL LOW (ref 135–145)

## 2020-08-24 LAB — GLUCOSE, CAPILLARY
Glucose-Capillary: 164 mg/dL — ABNORMAL HIGH (ref 70–99)
Glucose-Capillary: 90 mg/dL (ref 70–99)

## 2020-08-24 MED ORDER — AMOXICILLIN-POT CLAVULANATE 500-125 MG PO TABS
1.0000 | ORAL_TABLET | Freq: Two times a day (BID) | ORAL | 0 refills | Status: AC
  Filled 2020-08-24: qty 8, 4d supply, fill #0

## 2020-08-24 MED ORDER — CHLORHEXIDINE GLUCONATE CLOTH 2 % EX PADS
6.0000 | MEDICATED_PAD | Freq: Every day | CUTANEOUS | Status: DC
  Administered 2020-08-24: 6 via TOPICAL

## 2020-08-24 MED ORDER — AMOXICILLIN-POT CLAVULANATE 875-125 MG PO TABS
1.0000 | ORAL_TABLET | Freq: Two times a day (BID) | ORAL | 0 refills | Status: DC
  Filled 2020-08-24: qty 8, 4d supply, fill #0

## 2020-08-24 MED ORDER — AMOXICILLIN-POT CLAVULANATE 500-125 MG PO TABS
500.0000 mg | ORAL_TABLET | Freq: Two times a day (BID) | ORAL | Status: DC
  Filled 2020-08-24: qty 1

## 2020-08-24 MED ORDER — PANTOPRAZOLE SODIUM 40 MG PO TBEC: 40.0000 mg | DELAYED_RELEASE_TABLET | Freq: Every day | ORAL | Status: DC

## 2020-08-24 MED ORDER — AMOXICILLIN-POT CLAVULANATE 875-125 MG PO TABS: 1.0000 | ORAL_TABLET | Freq: Two times a day (BID) | ORAL | Status: DC

## 2020-08-24 MED ORDER — AMOXICILLIN-POT CLAVULANATE 500-125 MG PO TABS
500.0000 mg | ORAL_TABLET | Freq: Two times a day (BID) | ORAL | 0 refills | Status: DC
  Filled 2020-08-24: qty 8, 4d supply, fill #0

## 2020-08-24 MED ORDER — HYDROMORPHONE HCL 2 MG PO TABS
1.0000 mg | ORAL_TABLET | Freq: Once | ORAL | Status: AC
  Administered 2020-08-24: 1 mg via ORAL
  Filled 2020-08-24: qty 1

## 2020-08-24 NOTE — Care Management Important Message (Signed)
Important Message  Patient Details  Name: Gary [name] MRN: [id number] Date of Birth: [birth year]   Medicare Important Message Given:  Yes     Barb Merino Vance 08/24/2020, 2:13 PM

## 2020-08-24 NOTE — Progress Notes (Signed)
IM Md on called paged to get medication for pre medication for dressing change. Arthor Captain LPN

## 2020-08-24 NOTE — Plan of Care (Signed)
  Problem: Education: Goal: Knowledge of General Education information will improve Description: Including pain rating scale, medication(s)/side effects and non-pharmacologic comfort measures Outcome: Progressing   Problem: Coping: Goal: Level of anxiety will decrease Outcome: Progressing   

## 2020-08-24 NOTE — Progress Notes (Signed)
Occupational Therapy Treatment Patient Details Name: Gary [name] MRN: [id number] DOB: 05-30-1955 Today's Date: 08/24/2020    History of present illness Gary [name] is a 65 year old chronically male who presented to ED for acute dyspnea. Admitted on 08/17/20 with acute respiratory failure. 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   OT comments  Pt continues to make excellent progress towards OT goals, able to demonstrate mobility in room during ADLs using RW with min guard for safety. Pt able to complete ADLs standing at sink without support and no overt LOB. Placed BSC over toilet for pt to trial with increased ease of transfers - encouraged pt to use this same setup at home. Applied external force and balance challenges with pt able to self correct with Min force (difficulty with Mod force). Based on consistent presentation, OT and pt agree appropriate for [facility] therapy follow-up at this time as functional abilities have greatly improved in comparison to last week. DC recs updated to reflect this pending consistent perform with PT this afternoon.    Follow Up Recommendations  Home health OT;Supervision - Intermittent (direct supervision for mobility, ADLs initially)    Equipment Recommendations  3 in 1 bedside commode;Other (comment) (for placement over toilet)    Recommendations for Other Services      Precautions / Restrictions Precautions Precautions: Fall;Other (comment) Precaution Comments: hx of R BKA with prosthetic in room, monitor BP & HR Restrictions Weight Bearing Restrictions: No       Mobility Bed Mobility               General bed mobility comments: received in chair    Transfers Overall transfer level: Needs assistance Equipment used: Rolling walker (2 wheeled) Transfers: Sit  to/from [employer] Transfers Sit to Stand: Supervision Stand pivot transfers: Min guard       General transfer comment: no assist needed to lift or gain balance, min guard for safety in turning and navigating RW    Balance Overall balance assessment: Needs assistance Sitting-balance support: Feet supported;No upper extremity supported Sitting balance-Leahy Scale: Good     Standing balance support: Bilateral upper extremity supported;During functional activity Standing balance-Leahy Scale: Fair Standing balance comment: fair static standing at sink                           ADL either performed or assessed with clinical judgement   ADL Overall ADL's : Needs assistance/impaired Eating/Feeding: Independent;Sitting   Grooming: Set up;Standing;Wash/dry face;Applying deodorant;Oral care   Upper Body Bathing: Min guard;Standing Upper Body Bathing Details (indicate cue type and reason): min guard to bathe under arms standing at sink prior to applying deodorant             Toilet Transfer: Min guard;Ambulation;BSC;Regular Toilet;RW Toilet Transfer Details (indicate cue type and reason): Trialed BSC over toilet with pt able to demo increased ease of transfer         Functional mobility during ADLs: Min guard;Rolling walker General ADL Comments: Progressing well, able to demo mobility, toilet transfer and ADLs with min guard at most for safety. Pt reports having tub bench at home so no need to assess ability to step over tub. Assessed standing balance with pt able to statically stand without support, able to correct LOB from external force but LOB with up  to Mod A of force. Able to lift UEs above head, close eyes without LOB     Vision   Vision Assessment?: No apparent visual deficits   Perception     Praxis      Cognition Arousal/Alertness: Awake/alert Behavior During Therapy: WFL for tasks assessed/performed Overall Cognitive Status: Within Functional  Limits for tasks assessed                                 General Comments: Oriented, some cues for slower pacing for safety        Exercises     Shoulder Instructions       General Comments VSS on RA    Pertinent Vitals/ Pain       Pain Assessment: No/denies pain  Home Living                                          Prior Functioning/Environment              Frequency  Min 2X/week        Progress Toward Goals  OT Goals(current goals can now be found in the care plan section)  Progress towards OT goals: Progressing toward goals  Acute Rehab OT Goals Patient Stated Goal: go home OT Goal Formulation: With patient Time For Goal Achievement: 09/01/20 Potential to Achieve Goals: Good ADL Goals Pt Will Perform Grooming: with set-up;standing Pt Will Perform Lower Body Bathing: with supervision;sitting/lateral leans;sit to/from stand Pt Will Perform Lower Body Dressing: with supervision;sitting/lateral leans;sit to/from stand Pt Will Transfer to Toilet: with min guard assist;ambulating Pt Will Perform Toileting - Clothing Manipulation and hygiene: sitting/lateral leans;sit to/from stand;with set-up Pt/caregiver will Perform Home Exercise Program: Increased strength;Both right and left upper extremity;With theraband;Independently;With written HEP provided  Plan Discharge plan needs to be updated    Co-evaluation                 AM-PAC OT "6 Clicks" Daily Activity     Outcome Measure   Help from another person eating meals?: None Help from another person taking care of personal grooming?: A Little Help from another person toileting, which includes using toliet, bedpan, or urinal?: A Little Help from another person bathing (including washing, rinsing, drying)?: A Little Help from another person to put on and taking off regular upper body clothing?: A Little Help from another person to put on and taking off regular lower body  clothing?: A Little 6 Click Score: 19    End of Session Equipment Utilized During Treatment: Gait belt;Rolling walker  OT Visit Diagnosis: Unsteadiness on feet (R26.81);Other abnormalities of gait and mobility (R26.89);Muscle weakness (generalized) (M62.81)   Activity Tolerance Patient tolerated treatment well   Patient Left in chair;with call bell/phone within reach;with chair alarm set   Nurse Communication          Time: 7169-6789 OT Time Calculation (min): 19 min  Charges: OT General Charges $OT Visit: 1 Visit OT Treatments $Self Care/Home Management : 8-22 mins  Malachy Chamber, OTR/L Acute Rehab Services Office: [phone]   Layla Maw 08/24/2020, 12:32 PM

## 2020-08-24 NOTE — Plan of Care (Signed)
  Problem: Education: Goal: Knowledge of General Education information will improve Description: Including pain rating scale, medication(s)/side effects and non-pharmacologic comfort measures Outcome: Progressing   Problem: Health Behavior/Discharge Planning: Goal: Ability to manage health-related needs will improve Outcome: Progressing   Problem: Clinical Measurements: Goal: Ability to maintain clinical measurements within normal limits will improve Outcome: Progressing Goal: Will remain free from infection Outcome: Progressing Goal: Diagnostic test results will improve Outcome: Progressing Goal: Cardiovascular complication will be avoided Outcome: Progressing   Problem: Nutrition: Goal: Adequate nutrition will be maintained Outcome: Progressing   Problem: Coping: Goal: Level of anxiety will decrease Outcome: Progressing   Problem: Elimination: Goal: Will not experience complications related to bowel motility Outcome: Progressing Goal: Will not experience complications related to urinary retention Outcome: Progressing   Problem: Pain Managment: Goal: General experience of comfort will improve Outcome: Progressing   Problem: Safety: Goal: Ability to remain free from injury will improve Outcome: Progressing   Problem: Skin Integrity: Goal: Risk for impaired skin integrity will decrease Outcome: Progressing   Problem: Skin Integrity: Goal: Risk for impaired skin integrity will decrease Outcome: Progressing   Problem: Skin Integrity: Goal: Risk for impaired skin integrity will decrease Outcome: Progressing

## 2020-08-24 NOTE — Progress Notes (Signed)
Inpatient Rehab Admissions Coordinator:   Note PT and OT now recommending outpatient/home health.  Will sign off for CIR at this time.   Shann Medal, PT, DPT Admissions Coordinator [phone] 08/24/20  3:16 PM

## 2020-08-24 NOTE — Progress Notes (Signed)
[facility] Progress Note   Subjective:   Pt seen in room. Feeling well, no new concerns. On O2 2L but denies shortness of breath. No chest pain, palpitations or dizziness.   Objective Vitals:   08/23/20 1216 08/23/20 1621 08/23/20 2101 08/24/20 0421  BP: (!) 118/53 (!) 110/50 (!) 97/59 116/62  Pulse: 69 73 72 78  Resp: 16 16 18 18   Temp: 98.8 F (37.1 C) 98.2 F (36.8 C) 98.4 F (36.9 C) 98.3 F (36.8 C)  TempSrc: Oral Oral Oral Oral  SpO2: 100% 96% 94% 97%  Weight: 58 kg     Height:       Physical Exam General: Thin, elderly appearing male, alert and in NAD Heart: RRR, no murmurs, rubs or gallops  Lungs: CTA bilaterally without wheezing, rhonchi or rales Abdomen: Soft, non-distended, +BS Extremities: No edema b/l lower extremities, R BKA Dialysis Access: RUE AVF + bruit  Additional Objective Labs: Basic Metabolic Panel: Recent Labs  Lab 08/22/20 0320 08/23/20 0356 08/24/20 0549  NA 132* 135 131*  K 4.0 4.2 4.1  CL 96* 96* 94*  CO2 26 26 27   GLUCOSE 247* 185* 161*  BUN 44* 61* 26*  CREATININE 6.79* 8.85* 5.07*  CALCIUM 9.3 9.4 8.6*  PHOS 3.7 4.2 3.7   Liver Function Tests: Recent Labs  Lab 08/22/20 0320 08/23/20 0356 08/24/20 0549  ALBUMIN 2.3* 2.3* 2.3*   No results for input(s): LIPASE, AMYLASE in the last 168 hours. CBC: Recent Labs  Lab 08/17/20 1341 08/18/20 0043 08/20/20 0840 08/21/20 0048 08/23/20 0903 08/24/20 0549  WBC 13.2* 12.2* 13.2* 14.3* 8.9 8.9  NEUTROABS 10.8*  --   --  11.9*  --   --   HGB 13.6 11.1* 11.2* 12.3* 10.0* 10.8*  HCT 42.0 34.8* 35.6* 39.2 31.3* 33.5*  MCV 83.2 82.9 83.6 84.5 83.2 81.5  PLT 246 196 276 279 251 256   Blood Culture    Component Value Date/Time   SDES BLOOD RIGHT WRIST 08/21/2020 0055   SPECREQUEST  08/21/2020 0055    BOTTLES DRAWN AEROBIC AND ANAEROBIC Blood Culture adequate volume   CULT  08/21/2020 0055    NO GROWTH 3 DAYS Performed at [facility], [place] [address], [place], [place] [address]    REPTSTATUS PENDING 08/21/2020 7124    Cardiac Enzymes: No results for input(s): CKTOTAL, CKMB, CKMBINDEX, TROPONINI in the last 168 hours. CBG: Recent Labs  Lab 08/23/20 0634 08/23/20 1302 08/23/20 1617 08/23/20 2204 08/24/20 0645  GLUCAP 139* 178* 224* 197* 164*   Iron Studies: No results for input(s): IRON, TIBC, TRANSFERRIN, FERRITIN in the last 72 hours. @lablastinr3 @ Studies/Results: CT ABDOMEN PELVIS W CONTRAST  Addendum Date: 08/23/2020   ADDENDUM REPORT: 08/23/2020 20:59 ADDENDUM: There is a track like structure extending from the right perianal region into the medial right gluteal soft tissues. This measures approximately 2.5 cm in length. Small amount of fluid and tiny pocket of air noted within this track. No drainable fluid collection or abscess identified. Electronically Signed   By: Anner Crete M.D.   On: 08/23/2020 20:59   Result Date: 08/23/2020 CLINICAL DATA:  65 year old male with abdominal pain. EXAM: CT ABDOMEN AND PELVIS WITH CONTRAST TECHNIQUE: Multidetector CT imaging of the abdomen and pelvis was performed using the standard protocol following bolus administration of intravenous contrast. CONTRAST:  182mL OMNIPAQUE IOHEXOL 300 MG/ML  SOLN COMPARISON:  None. FINDINGS: Lower chest: Partially visualized trace bilateral pleural effusions. There is diffuse interstitial and interlobular septal  prominence consistent with edema. There is mild cardiomegaly. No intra-abdominal free air or free fluid. Hepatobiliary: Slight heterogeneous enhancement of the liver may be related to mild passive congestion. No intrahepatic biliary dilatation. The gallbladder is unremarkable. Pancreas: The pancreas is moderately atrophic. No active inflammatory changes or fluid collection. Spleen: Normal in size without focal abnormality. Adrenals/Urinary Tract: The adrenal glands unremarkable. Moderate bilateral renal atrophy. No hydronephrosis. There is poor  enhancement of the renal parenchyma indicative of hypoperfusion. There is no hydronephrosis on either side. The urinary bladder is grossly unremarkable. Stomach/Bowel: Mildly distended stomach may represent gastroparesis. No evidence of gastric outlet obstruction. There is no bowel obstruction or active inflammation. The appendix is normal. Vascular/Lymphatic: Advanced aortoiliac atherosclerotic disease. 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 The graft is patent. There is advanced atherosclerotic calcification of the mesenteric vasculature. The IVC is grossly unremarkable. No portal venous gas. No adenopathy. Reproductive: Mildly enlarged prostate gland. Other: There is diffuse subcutaneous and mesenteric edema. No fluid collection. There is a broad-based ventral hernia at the level of the umbilicus measuring approximately 4 cm in diameter. There is slight protrusion of anterior wall of the mid transverse colon. Musculoskeletal: Mild degenerative changes. No acute osseous pathology. IMPRESSION: 1. No acute intra-abdominal or pelvic pathology. No bowel obstruction. Normal appendix. 2. Moderate bilateral renal atrophy with findings suggestive of renal hypoperfusion. 3. 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 The graft is patent. 4. Mild cardiomegaly with findings of CHF and trace bilateral pleural effusions. 5. Mild anasarca. 6. Aortic Atherosclerosis (ICD10-I70.0). Electronically Signed: By: Anner Crete M.D. On: 08/23/2020 20:51   Medications: . ampicillin-sulbactam (UNASYN) IV 1.5 g (08/24/20 0434)   . aspirin EC  81 mg Oral Daily  . Chlorhexidine Gluconate Cloth  6 each Topical Q0600  . Chlorhexidine Gluconate Cloth  6 each Topical Q0600  . Chlorhexidine Gluconate Cloth  6 each Topical Q0600  . doxercalciferol  3 mcg Intravenous Q M,W,F-HD   . feeding supplement (NEPRO CARB STEADY)  237 mL Oral BID BM  . heparin  5,000 Units Subcutaneous Q8H  . insulin aspart  0-9 Units Subcutaneous TID WC  . insulin glargine  13 Units Subcutaneous QHS  . multivitamin  1 tablet Oral QHS  . nicotine  21 mg Transdermal Daily  . pantoprazole  40 mg Oral Daily  . rosuvastatin  20 mg Oral Daily  . sevelamer carbonate  3,200 mg Oral TID WC    [facility]Re, 3:45 hours, BFR 400, DFR 800, EDW 59kg, 2K/2Ca, AVF 15g, heparin 2200 unit bolus Hectorol 6 mcg IV q HD  Assessment/Plan: 1. Hypoxia: O2 sat 79% on RAon admit.  Had febrile illness but CXR clear.  Hypoxia has improved with serial HD.  Now below EDW, will need new EDW on DC.   2. Febrile illness/SIRS with hypotension=work-up per admit.  CXR clear. Blood cultures NGTD.  3. ESRD:Dialyzes on MWF schedule, missed HD4/18due to transportation issues.Continue MWF schedule. 4. Hypertension: BPlow normal now, now off all BP meds. 5. Anemia:Hemoglobinat goal, no ESA indicated at this time.  6. Metabolic bone disease:Calciumelevated to 10.3 during admission, hectorol dec 6 to 40mcg TIW.  Calcium level improved. Phos at goal; continue binder Renvela 7. Nutrition:ALB low,renal/carb modified diet.startedprotein supplement 8. Deconditioning=noted awaiting rehab/CIR versus NHP 9.Diabetes mellitus: Management per admitting team  Anice Paganini, PA-C 08/24/2020, 9:24 AM  [facility] Pager: [phone]

## 2020-08-24 NOTE — Progress Notes (Signed)
DISCHARGE NOTE HOME Gary [name] to be discharged Home per MD order. Discussed prescriptions and follow up appointments with the patient. Prescriptions given to patient; medication list explained in detail. Patient verbalized understanding.  Skin clean, dry and intact without evidence of skin break down, no evidence of skin tears noted. IV catheter discontinued intact. Site without signs and symptoms of complications. Dressing and pressure applied. Pt denies pain at the site currently. No complaints noted.  Patient free of lines, drains, and wounds.   An After Visit Summary (AVS) was printed and given to the patient. Patient escorted via wheelchair, and discharged home via private auto.  Arlyss Repress, RN

## 2020-08-24 NOTE — Progress Notes (Incomplete)
HD#7 Subjective:  Overnight Events: ***   ***No acute events overnight. During evaluation this morning, patient states he is ready to get out of the hospital. He lives at home with his wife who is healthy and helps him get around. He uses a cane while ambulating. He reports staying inside is frustrating, when he's at home he enjoys getting outside in the sunshine. When asked about wound found on his sacrum, he reports he had one a long time ago but not since.   Discuss placement with [facility] physicians.   Objective:  Vital signs in last 24 hours: Vitals:   08/23/20 1216 08/23/20 1621 08/23/20 2101 08/24/20 0421  BP: (!) 118/53 (!) 110/50 (!) 97/59 116/62  Pulse: 69 73 72 78  Resp: 16 16 18 18   Temp: 98.8 F (37.1 C) 98.2 F (36.8 C) 98.4 F (36.9 C) 98.3 F (36.8 C)  TempSrc: Oral Oral Oral Oral  SpO2: 100% 96% 94% 97%  Weight: 58 kg     Height:       Supplemental O2: {NAMES:3044014::"Room Air","Nasal Cannula","Simple Face Mask","Partial Rebreather","HFNC","Non Rebreather","Venturi Mask","Bag Valve Mask"} SpO2: 97 % O2 Flow Rate (L/min): 2 L/min   Physical Exam:  Physical Exam  Filed Weights   08/22/20 2140 08/23/20 0833 08/23/20 1216  Weight: 59 kg 58.1 kg 58 kg     Intake/Output Summary (Last 24 hours) at 08/24/2020 0721 Last data filed at 08/23/2020 1825 Gross per 24 hour  Intake 697 ml  Output 0 ml  Net 697 ml   Net IO Since Admission: -1,862 mL [08/24/20 0721]  Pertinent Labs: CBC Latest Ref Rng & Units 08/24/2020 08/23/2020 08/21/2020  WBC 4.0 - 10.5 K/uL 8.9 8.9 14.3(H)  Hemoglobin 13.0 - 17.0 g/dL 10.8(L) 10.0(L) 12.3(L)  Hematocrit 39.0 - 52.0 % 33.5(L) 31.3(L) 39.2  Platelets 150 - 400 K/uL 256 251 279    CMP Latest Ref Rng & Units 08/24/2020 08/23/2020 08/22/2020  Glucose 70 - 99 mg/dL 161(H) 185(H) 247(H)  BUN 8 - 23 mg/dL 26(H) 61(H) 44(H)  Creatinine 0.61 - 1.24 mg/dL 5.07(H) 8.85(H) 6.79(H)  Sodium 135 - 145 mmol/L 131(L) 135 132(L)  Potassium  3.5 - 5.1 mmol/L 4.1 4.2 4.0  Chloride 98 - 111 mmol/L 94(L) 96(L) 96(L)  CO2 22 - 32 mmol/L 27 26 26   Calcium 8.9 - 10.3 mg/dL 8.6(L) 9.4 9.3  Total Protein 6.5 - 8.1 g/dL - - -  Total Bilirubin 0.3 - 1.2 mg/dL - - -  Alkaline Phos 38 - 126 U/L - - -  AST 15 - 41 U/L - - -  ALT 0 - 44 U/L - - -    Imaging: CT ABDOMEN PELVIS W CONTRAST  Addendum Date: 08/23/2020   ADDENDUM REPORT: 08/23/2020 20:59 ADDENDUM: There is a track like structure extending from the right perianal region into the medial right gluteal soft tissues. This measures approximately 2.5 cm in length. Small amount of fluid and tiny pocket of air noted within this track. No drainable fluid collection or abscess identified. Electronically Signed   By: Anner Crete M.D.   On: 08/23/2020 20:59   Result Date: 08/23/2020 CLINICAL DATA:  65 year old male with abdominal pain. EXAM: CT ABDOMEN AND PELVIS WITH CONTRAST TECHNIQUE: Multidetector CT imaging of the abdomen and pelvis was performed using the standard protocol following bolus administration of intravenous contrast. CONTRAST:  135mL OMNIPAQUE IOHEXOL 300 MG/ML  SOLN COMPARISON:  None. FINDINGS: Lower chest: Partially visualized trace bilateral pleural effusions. There is diffuse interstitial and  interlobular septal prominence consistent with edema. There is mild cardiomegaly. No intra-abdominal free air or free fluid. Hepatobiliary: Slight heterogeneous enhancement of the liver may be related to mild passive congestion. No intrahepatic biliary dilatation. The gallbladder is unremarkable. Pancreas: The pancreas is moderately atrophic. No active inflammatory changes or fluid collection. Spleen: Normal in size without focal abnormality. Adrenals/Urinary Tract: The adrenal glands unremarkable. Moderate bilateral renal atrophy. No hydronephrosis. There is poor enhancement of the renal parenchyma indicative of hypoperfusion. There is no hydronephrosis on either side. The urinary bladder  is grossly unremarkable. Stomach/Bowel: Mildly distended stomach may represent gastroparesis. No evidence of gastric outlet obstruction. There is no bowel obstruction or active inflammation. The appendix is normal. Vascular/Lymphatic: Advanced aortoiliac atherosclerotic disease. 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 The graft is patent. There is advanced atherosclerotic calcification of the mesenteric vasculature. The IVC is grossly unremarkable. No portal venous gas. No adenopathy. Reproductive: Mildly enlarged prostate gland. Other: There is diffuse subcutaneous and mesenteric edema. No fluid collection. There is a broad-based ventral hernia at the level of the umbilicus measuring approximately 4 cm in diameter. There is slight protrusion of anterior wall of the mid transverse colon. Musculoskeletal: Mild degenerative changes. No acute osseous pathology. IMPRESSION: 1. No acute intra-abdominal or pelvic pathology. No bowel obstruction. Normal appendix. 2. Moderate bilateral renal atrophy with findings suggestive of renal hypoperfusion. 3. 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 The graft is patent. 4. Mild cardiomegaly with findings of CHF and trace bilateral pleural effusions. 5. Mild anasarca. 6. Aortic Atherosclerosis (ICD10-I70.0). Electronically Signed: By: Anner Crete M.D. 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 with a  pertinent PMH of ***, who presented with *** and admitted for ***.    *** ***  *** ***  *** ***  *** ***  Diet: {NAMES:3044014::"Normal","Heart Healthy","Carb-Modified","Renal","Carb/Renal","NPO","TPN","Tube Feeds"} IVF: {NAMES:3044014::"None","NS","1/2 NS","LR","D5","D10"},{NAMES:3044014::"None","10cc/hr","25cc/hr","50cc/hr","75cc/hr","100cc/hr","110cc/hr","125cc/hr","Bolus"} VTE: {NAMES:3044014::"Heparin","Enoxaparin","SCDs","NOAC","None"} Code: {NAMES:3044014::"Full","DNR","DNI","DNR/DNI","Comfort Care","Unknown"} PT/OT recs: {NAMES:3044014::"None","Pending","CIR","SNF for Subacute PT","LTAC","Home Health"}, {Assistive Devices KZS:01093}. TOC recs: ***   Dispo: Anticipated discharge to {Discharge Destination:18313::"Home"} in {NUMBERS:20191} days pending ***.   Gaylan Gerold, DO 08/24/2020, 7:21 AM Pager: [phone]  Please contact the on call pager after 5 pm and on weekends at [phone].

## 2020-08-24 NOTE — Progress Notes (Signed)
Physical Therapy Treatment Patient Details Name: Gary [name] MRN: [id number] DOB: November 04, 1955 Today's Date: 08/24/2020    History of Present Illness Gary [name] is a 65 year old chronically male who presented to ED for acute dyspnea. Admitted on 08/17/20 with acute respiratory failure. 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    PT Comments    Pt received in bed, very cooperative and pleasant. Progressing towards PT goals. Able to progress to ambulation with SPC on L side. Min guard for safety, but no overt LOB. Minimal cueing for pacing. Pt left in bed with all needs met, call bell within reach, and bed alarm active. Due to more consistent progress and pt reporting that he feels close to baseline, updated follow up plans to outpatient physical therapy. Will continue to follow acutely.   Follow Up Recommendations  Outpatient PT;Other (comment) (Through [facility] if possible)     Equipment Recommendations  3in1 (PT)    Recommendations for Other Services       Precautions / Restrictions Precautions Precautions: Fall;Other (comment) Precaution Comments: hx of R BKA with prosthetic in room, monitor BP & HR Restrictions Weight Bearing Restrictions: No    Mobility  Bed Mobility Overal bed mobility: Needs Assistance Bed Mobility: Supine to Sit;Sit to Supine Rolling: Modified independent (Device/Increase time)   Supine to sit: Modified independent (Device/Increase time) Sit to supine: Modified independent (Device/Increase time)    Transfers Overall transfer level: Needs assistance Equipment used: Straight cane Transfers: Sit to/from Stand Sit to Stand: Supervision        General transfer comment: no assist needed to lift or gain balance  Ambulation/Gait Ambulation/Gait assistance: Min guard Gait  Distance (Feet): 150 Feet Assistive device: Straight cane Gait Pattern/deviations: Step-through pattern;Decreased stride length Gait velocity: decreased   General Gait Details: min guard for safety   Stairs             Wheelchair Mobility    Modified Rankin (Stroke Patients Only)       Balance Overall balance assessment: Needs assistance Sitting-balance support: Feet supported;No upper extremity supported Sitting balance-Leahy Scale: Good     Standing balance support: Bilateral upper extremity supported;During functional activity Standing balance-Leahy Scale: Fair Standing balance comment: fair static standing balance, benefits from 1 UE supported during ambulation                            Cognition Arousal/Alertness: Awake/alert Behavior During Therapy: WFL for tasks assessed/performed Overall Cognitive Status: Within Functional Limits for tasks assessed                                       Exercises      General Comments General comments (skin integrity, edema, etc.): VSS on RA      Pertinent Vitals/Pain Pain Assessment: No/denies pain    Home Living                      Prior Function            PT Goals (current goals can now be found in the care plan section) Acute Rehab PT Goals Patient Stated Goal: feel better, get strength back PT Goal Formulation: With patient Time For Goal  Achievement: 09/01/20 Potential to Achieve Goals: Good Progress towards PT goals: Progressing toward goals    Frequency    Min 3X/week      PT Plan Current plan remains appropriate    Co-evaluation              AM-PAC PT "6 Clicks" Mobility   Outcome Measure  Help needed turning from your back to your side while in a flat bed without using bedrails?: A Little Help needed moving from lying on your back to sitting on the side of a flat bed without using bedrails?: A Little Help needed moving to and from a bed to a  chair (including a wheelchair)?: A Little Help needed standing up from a chair using your arms (e.g., wheelchair or bedside chair)?: A Little Help needed to walk in hospital room?: A Little Help needed climbing 3-5 steps with a railing? : A Little 6 Click Score: 18    End of Session Equipment Utilized During Treatment: Gait belt Activity Tolerance: Patient tolerated treatment well Patient left: with call bell/phone within reach;in bed;with bed alarm set Nurse Communication: Mobility status PT Visit Diagnosis: Unsteadiness on feet (R26.81);Difficulty in walking, not elsewhere classified (R26.2);Muscle weakness (generalized) (M62.81)     Time:  -     Charges:                        Rosita Kea, SPT

## 2020-08-26 LAB — CULTURE, BLOOD (ROUTINE X 2)
Culture: NO GROWTH
Culture: NO GROWTH
Special Requests: ADEQUATE
Special Requests: ADEQUATE

## 2020-08-27 ENCOUNTER — Other Ambulatory Visit: Payer: Self-pay | Admitting: Family Medicine

## 2020-08-27 DIAGNOSIS — Z0000 Encounter for general adult medical examination without abnormal findings: Secondary | ICD-10-CM

## 2020-08-27 DIAGNOSIS — E559 Vitamin D deficiency, unspecified: Secondary | ICD-10-CM

## 2020-08-30 ENCOUNTER — Telehealth: Payer: Self-pay | Admitting: Podiatry

## 2020-08-30 ENCOUNTER — Encounter: Payer: Self-pay | Admitting: Podiatry

## 2020-08-30 NOTE — Telephone Encounter (Signed)
Called pt lvm to reschedule 6/30 appt with Dr. Jacqualyn Posey. Also sent letter.

## 2020-09-07 ENCOUNTER — Other Ambulatory Visit (HOSPITAL_COMMUNITY): Payer: Self-pay

## 2020-09-07 ENCOUNTER — Ambulatory Visit (INDEPENDENT_AMBULATORY_CARE_PROVIDER_SITE_OTHER): Payer: Medicare (Managed Care) | Admitting: Podiatry

## 2020-09-07 ENCOUNTER — Ambulatory Visit (INDEPENDENT_AMBULATORY_CARE_PROVIDER_SITE_OTHER): Payer: Medicare (Managed Care)

## 2020-09-07 ENCOUNTER — Other Ambulatory Visit: Payer: Self-pay

## 2020-09-07 DIAGNOSIS — M869 Osteomyelitis, unspecified: Secondary | ICD-10-CM

## 2020-09-07 DIAGNOSIS — I739 Peripheral vascular disease, unspecified: Secondary | ICD-10-CM | POA: Diagnosis not present

## 2020-09-07 DIAGNOSIS — L97521 Non-pressure chronic ulcer of other part of left foot limited to breakdown of skin: Secondary | ICD-10-CM | POA: Diagnosis not present

## 2020-09-07 MED ORDER — DOXYCYCLINE HYCLATE 100 MG PO TABS
100.0000 mg | ORAL_TABLET | Freq: Two times a day (BID) | ORAL | 0 refills | Status: DC
  Filled 2020-09-07: qty 20, 10d supply, fill #0

## 2020-09-09 ENCOUNTER — Telehealth: Payer: Self-pay | Admitting: *Deleted

## 2020-09-09 ENCOUNTER — Other Ambulatory Visit: Payer: Self-pay | Admitting: Physician Assistant

## 2020-09-09 DIAGNOSIS — H903 Sensorineural hearing loss, bilateral: Secondary | ICD-10-CM

## 2020-09-09 NOTE — Telephone Encounter (Signed)
-----   Message from Trula Slade, DPM sent at 09/08/2020  7:08 PM EDT ----- I cannot send his antibiotic electronically for some reason. Can you call [facility] and see what pharmacy it needs to go to? Thanks!

## 2020-09-09 NOTE — Telephone Encounter (Signed)
I called and spoke with [facility] and the patient's doctor is Clemens Catholic and I faxed over the antibiotic for the patient at [phone] and called the patient to let him know and had to leave a message. Lattie Haw

## 2020-09-09 NOTE — Progress Notes (Signed)
Subjective: 65 year old male presents the office today for evaluation of left second toe wound.  At last appointment he was sent to the hospital for unrelated issues.  He presents today for further evaluation of the wound.  He was on antibiotics states for couple of days but is no longer on any antibiotics.  He states the toe is somewhat swollen he has a wound present denies any significant drainage or pus.  No red streaks.  No swelling or redness to the foot.  Currently denies any fevers, chills, nausea, vomiting.  No calf pain, chest pain, shortness of breath.  Objective: AAO x3, NAD DP/PT pulses palpable bilaterally, CRT less than 3 seconds There is edema present second toe but no significant erythema warmth or ascending cellulitis.  Superficial wound present lateral aspect the PIPJ with a hypergranular wound base and some clear drainage expressed.  There is no purulence.  No ascending cellulitis.  No fluctuation crepitation there is no malodor. No pain with calf compression, swelling, warmth, erythema  Assessment: Ulcer, swelling left second toe  Plan: X-rays obtained reviewed.  No evidence of acute  cortical destruction at this time.  Recommended antibiotic ointment dressing changes daily and offloading.  We will send doxycycline.  Having trouble setting this to the pharmacy will contact [facility] to get this started.  Also recommend follow-up with vascular surgery given PAD, ulcerations and history of right BKA. Monitor for any clinical signs or symptoms of infection and directed to call the office immediately should any occur or go to the ER.  Return in about 1 week (around 09/14/2020).  Trula Slade DPM

## 2020-09-14 ENCOUNTER — Other Ambulatory Visit: Payer: Self-pay

## 2020-09-14 ENCOUNTER — Ambulatory Visit (INDEPENDENT_AMBULATORY_CARE_PROVIDER_SITE_OTHER): Payer: Medicare (Managed Care) | Admitting: Podiatry

## 2020-09-14 DIAGNOSIS — I739 Peripheral vascular disease, unspecified: Secondary | ICD-10-CM | POA: Diagnosis not present

## 2020-09-14 DIAGNOSIS — L97521 Non-pressure chronic ulcer of other part of left foot limited to breakdown of skin: Secondary | ICD-10-CM

## 2020-09-14 MED ORDER — MUPIROCIN 2 % EX OINT: 1.0000 "application " | TOPICAL_OINTMENT | Freq: Two times a day (BID) | CUTANEOUS | 2 refills | Status: DC

## 2020-09-16 ENCOUNTER — Other Ambulatory Visit: Payer: Self-pay

## 2020-09-16 DIAGNOSIS — I739 Peripheral vascular disease, unspecified: Secondary | ICD-10-CM

## 2020-09-19 NOTE — Progress Notes (Signed)
Subjective: 65 year old male presents the office today for evaluation of left second toe wound.  He states that the toe is doing better.  He has not seen any bleeding or drainage coming from the wound.  Denies any fevers, chills, nausea, vomiting.  No calf pain, chest pain, shortness of breath at this time.  Objective: AAO x3, NAD Right BKA DP/PT pulses decreased  Ulceration to the lateral aspect of the second toe on the PIPJ with a granular wound base there is no probing to bone, undermining or tunneling.  Hyperkeratotic periwound minimal edema to the distal aspect of toe but there is no erythema or warmth.  There is no ascending cellulitis.  There is no fluctuation or crepitation.  There is no malodor. No pain with calf compression, swelling, warmth, erythema  Assessment: Ulceration left second toe, PAD  Plan: -All treatment options discussed with the patient including all alternatives, risks, complications.  -Sharply debrided the nonviable devitalized tissue utilizing 312 with scalpel down to healthy, granular tissue.  Continue with daily dressing changes and offloading.  He has an appointment scheduled to see vascular surgery on October 05, 2020.  Monitor closely for any signs or symptoms of infection reported the emergency department should any occur. -Finish course of antibiotics. -Patient encouraged to call the office with any questions, concerns, change in symptoms.   Trula Slade DPM

## 2020-09-21 ENCOUNTER — Ambulatory Visit (INDEPENDENT_AMBULATORY_CARE_PROVIDER_SITE_OTHER): Payer: Medicare (Managed Care) | Admitting: Internal Medicine

## 2020-09-21 ENCOUNTER — Encounter: Payer: Self-pay | Admitting: Internal Medicine

## 2020-09-21 ENCOUNTER — Other Ambulatory Visit: Payer: Self-pay

## 2020-09-21 ENCOUNTER — Other Ambulatory Visit (HOSPITAL_COMMUNITY): Payer: Self-pay | Admitting: Radiology

## 2020-09-21 DIAGNOSIS — J9611 Chronic respiratory failure with hypoxia: Secondary | ICD-10-CM | POA: Diagnosis not present

## 2020-09-21 DIAGNOSIS — J449 Chronic obstructive pulmonary disease, unspecified: Secondary | ICD-10-CM

## 2020-09-21 DIAGNOSIS — R590 Localized enlarged lymph nodes: Secondary | ICD-10-CM | POA: Diagnosis not present

## 2020-09-21 NOTE — Progress Notes (Signed)
Gary [name], male    DOB: [birth year]    MRN: [id number]   Brief patient profile:  29 yobm ESRF > HD around spring 2021 and then doe late fall of 2021 and started on 02 p admit:       Date of Admission: 08/17/2020   Date of Discharge: 08/24/2020    Discharge Diagnosis: 1. Acute hypoxic respiratory failure 2. ESRD on HD MWF 3. Mediastinal and hilar adenopathy 4. Tobacco use disorder 5. Physical deconditioning 6. Insulin dependent type 2 diabetes mellitus 7. Moderate malnutrition 8. Peripheral arterial disease 9. History of right BKA 10. 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 Chronic combined heart failure 12. Left bundle branch block 13. Hyperlipidemia 14. History of hypertension   Sacral pressure ulcer No abscess shown on CT abdomen pelvis.  Patient was on 3 days of Unasyn.  Continue 4 days of Augmentin after discharge. -Patient will need dressing changed to daily -Encourage PT and offloading to promote healing  Mediastinal and hilar adenopathy incidentally noted on chest CT on admission. 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 He developed hypotension with increased volume removal from HD due to a decreased estimate in his dry weight.  -amlodipine, hydralazine and metoprolol held at discharge -re-evaluate at time of follow up       History of Present Illness  09/21/2020  Pulmonary/ 1st office eval/Carmen Tolliver  Chief Complaint  Patient presents with  . Pulmonary Consult    Referred by Clemens Catholic, NP. Pt states started on o2 recently. He c/o DOE with walking "not too far".   Dyspnea:  [place] with 02 / does grocery shopping with [facility] parking  Cough: minimal / no color  Sleep: flat is flat, 2 pillows  SABA use: not  sure it helps   No obvious day to day or daytime variability or assoc excess/ purulent sputum or mucus plugs or hemoptysis or cp or chest tightness, subjective wheeze or overt sinus or hb symptoms.   Sleeping as above without nocturnal  or early am exacerbation  of respiratory  c/o's or need for noct saba. Also denies any obvious fluctuation of symptoms with weather or environmental changes or other aggravating or alleviating factors except as outlined above   No unusual exposure hx or h/o childhood pna/ asthma or knowledge of premature birth.  Current Allergies, Complete Past Medical History, Past Surgical History, Family History, and Social History were reviewed in [employer] record.  ROS  The following are not active complaints unless bolded Hoarseness, sore throat, dysphagia, dental problems, itching, sneezing,  nasal congestion or discharge of excess mucus or purulent secretions, ear ache,   fever, chills, sweats, unintended wt loss or wt gain, classically pleuritic or exertional cp,  orthopnea pnd or arm/hand swelling  or leg swelling, presyncope, palpitations, abdominal pain, anorexia, nausea, vomiting, diarrhea  or change in bowel habits or change in bladder habits, change in stools or change in urine, dysuria, hematuria,  rash, arthralgias, visual complaints, headache, numbness, weakness or ataxia or problems with walking or coordination,  change in mood or  memory.           Past Medical History:  Diagnosis Date  . Blurred vision   . Chronic kidney disease    sees [facility]  . Constipation   .  Constipation   . Diabetes mellitus    type 2  . GERD (gastroesophageal reflux disease)   . High cholesterol   . Hypertension   . Insomnia   . Peripheral artery disease ([place])   . [place] stain of face    Left V1 distribution, including upper eyelid  . Sensorineural hearing loss    left ear  . Vitamin D deficiency     Outpatient Medications Prior to  Visit  Medication Sig Dispense Refill  . acetaminophen (TYLENOL) 500 MG tablet Take 1,000 mg by mouth 3 (three) times daily as needed for mild pain.    Marland Kitchen aspirin EC 81 MG tablet Take 81 mg by mouth every 4 (four) hours as needed.    . bisacodyl (DULCOLAX) 5 MG EC tablet Take 5 mg by mouth daily.    . Dulaglutide (TRULICITY) 2.75 TZ/0.0FV SOPN Inject 0.75 mg into the skin once a week.    . Evolocumab with Infusor ([place]) 420 MG/3.5ML SOCT Inject 420 mg into the skin every 30 (thirty) days.    . famotidine (PEPCID) 40 MG tablet Take 40 mg by mouth at bedtime.    . furosemide (LASIX) 80 MG tablet Take 80-120 mg by mouth See admin instructions. 120mg  in the morning 80mg  in the afternoon    . metoCLOPramide (REGLAN) 5 MG tablet Take 5 mg by mouth See admin instructions. 5mg  daily as bedtime And 5mg  three times daily as needed for nausea vomitting    . multivitamin (RENA-VIT) TABS tablet Take 1 tablet by mouth daily.    . mupirocin ointment (BACTROBAN) 2 % Apply 1 application topically 2 (two) times daily. 30 g 2  . Nutritional Supplements (FEEDING SUPPLEMENT, NEPRO CARB STEADY,) LIQD Take 240 mLs by mouth daily.    . pantoprazole (PROTONIX) 40 MG tablet Take 1 tablet (40 mg total) by mouth daily.    . polyethylene glycol (MIRALAX / GLYCOLAX) 17 g packet Take 17 g by mouth daily.    . rosuvastatin (CRESTOR) 20 MG tablet Take 20 mg by mouth daily.     Marland Kitchen senna-docusate (SENOKOT-S) 8.6-50 MG tablet Take 2 tablets by mouth 2 (two) times daily.    . Vitamin D, Ergocalciferol, (DRISDOL) 1.25 MG (50000 UNIT) CAPS capsule Take 50,000 Units by mouth every 30 (thirty) days.    Marland Kitchen doxycycline (VIBRA-TABS) 100 MG tablet Take 1 tablet (100 mg total) by mouth 2 (two) times daily. 20 tablet 0   No facility-administered medications prior to visit.     Objective:     BP (!) 90/58 (BP Location: Left Arm, Cuff Size: Normal)   Pulse (!) 55   Temp (!) 97.4 F (36.3 C) (Temporal)   Ht 5\' 5"   (1.651 m)   Wt 136 lb 3.2 oz (61.8 kg)   SpO2 93% Comment: 3lpm cont o2  BMI 22.66 kg/m   SpO2: 93 % (3lpm cont o2) O2 Type: Continuous O2 O2 Flow Rate (L/min): 3 L/min   Wt Readings from Last 3 Encounters:  09/21/20 136 lb 3.2 oz (61.8 kg)  08/23/20 127 lb 13.9 oz (58 kg)  01/20/20 130 lb (59 kg)     Amb somber chronically ill appearing  bm nad   Vital signs reviewed  09/21/2020  - Note at rest 02 sats  93% on 3lpm     HEENT : pt wearing mask not removed for exam due to covid -19 concerns.    NECK :  without JVD/Nodes/TM/ nl carotid upstrokes bilaterally  LUNGS: no acc muscle use,  Mod barrel  contour chest wall with bilateral  Distant bs s audible wheeze and  without cough on insp or exp maneuvers and mod  Hyperresonant  to  percussion bilaterally     CV:  RRR  no s3 or murmur or increase in P2, and no edema   ABD:  soft and nontender with pos mid insp Hoover's  in the supine position. No bruits or organomegaly appreciated, bowel sounds nl  MS:     ext warm without deformities, calf tenderness, cyanosis or clubbing No obvious joint restrictions   SKIN: warm and dry without lesions    NEURO:  alert, approp, nl sensorium with  no motor or cerebellar deficits apparent.         I personally reviewed images and agree with radiology impression as follows:   Chest CTa  08/17/20 1. No CT findings for pulmonary embolism. 2. Significant age advanced atherosclerotic calcification involving the thoracic aorta, branch vessels and in particular the coronary arteries. 3. Significant reflux of contrast down the IVC and into the hepatic veins suggesting tricuspid regurgitation. 4. Moderate peribronchial thickening most notably in the lower lung zones bilaterally suggesting bronchitis. 5. Patchy mosaic pattern of ground-glass attenuation in the lungs. This can be seen with reactive airways disease/asthma respiratory bronchiolitis, cryptogenic organizing pneumonia or  hypersensitivity pneumonitis. Atypical/viral pneumonia would be another possibility. 6. Mediastinal and hilar lymphadenopathy. Patient may need further evaluation with PET-CT. 7. Aortic atherosclerosis   . I personally reviewed images /adiology impression as follows:  CXR:     09/23/20  CM / no adenopathy apparent      Assessment   No problem-specific Assessment & Plan notes found for this encounter.     Christinia Gully, MD 09/21/2020

## 2020-09-21 NOTE — Patient Instructions (Addendum)
Please remember to go to the  x-ray department  for your tests - we will call you with the results when they are available     Please schedule a follow up visit in  6  months but call sooner if needed  Late add:  Move f/u to 3 months with cxr

## 2020-09-23 ENCOUNTER — Other Ambulatory Visit: Payer: Self-pay | Admitting: Family Medicine

## 2020-09-23 ENCOUNTER — Ambulatory Visit
Admission: RE | Admit: 2020-09-23 | Discharge: 2020-09-23 | Disposition: A | Discharge status: Home / Self Care | Payer: Medicare (Managed Care) | Source: Ambulatory Visit | Attending: Family Medicine | Admitting: Family Medicine

## 2020-09-23 DIAGNOSIS — R0902 Hypoxemia: Secondary | ICD-10-CM

## 2020-09-23 DIAGNOSIS — J449 Chronic obstructive pulmonary disease, unspecified: Secondary | ICD-10-CM

## 2020-09-23 DIAGNOSIS — I159 Secondary hypertension, unspecified: Secondary | ICD-10-CM

## 2020-09-28 ENCOUNTER — Other Ambulatory Visit: Payer: Self-pay

## 2020-09-28 ENCOUNTER — Ambulatory Visit (HOSPITAL_COMMUNITY)
Admission: RE | Admit: 2020-09-28 | Discharge: 2020-09-28 | Disposition: A | Discharge status: Home / Self Care | Payer: Medicare (Managed Care) | Source: Ambulatory Visit | Attending: Vascular Surgery | Admitting: Vascular Surgery

## 2020-09-28 ENCOUNTER — Telehealth: Payer: Self-pay | Admitting: Internal Medicine

## 2020-09-28 DIAGNOSIS — R590 Localized enlarged lymph nodes: Secondary | ICD-10-CM

## 2020-09-28 DIAGNOSIS — J449 Chronic obstructive pulmonary disease, unspecified: Secondary | ICD-10-CM | POA: Diagnosis not present

## 2020-09-28 LAB — BLOOD GAS, ARTERIAL
Acid-Base Excess: 5.9 mmol/L — ABNORMAL HIGH (ref 0.0–2.0)
Bicarbonate: 29.9 mmol/L — ABNORMAL HIGH (ref 20.0–28.0)
Drawn by: 21179
FIO2: 28
O2 Saturation: 98.6 %
Patient temperature: 37
pCO2 arterial: 44.4 mmHg (ref 32.0–48.0)
pH, Arterial: 7.444 (ref 7.350–7.450)
pO2, Arterial: 131 mmHg — ABNORMAL HIGH (ref 83.0–108.0)

## 2020-09-28 NOTE — Telephone Encounter (Signed)
Spoke with Vick Frees with pt's PCP office. She noticed that Dr Melvyn Novas had mentioned pt having a PET scan at his 09/21/20 visit but didn't see one ordered. She wants to know if she needs to order this for the pt. Dr wert, please advise.  Pt had CXR done on 09/23/20 through PCP office because xray was down on 09/21/20.

## 2020-09-28 NOTE — Progress Notes (Signed)
Pt in today for ABG on 2 lpm [place].  Done results in computer

## 2020-09-28 NOTE — Telephone Encounter (Signed)
Will need to call PCP's office tomorrow since it is now after 5pm.

## 2020-09-28 NOTE — Telephone Encounter (Signed)
I reviewed the cxr - did not realize pcp had done it and it does not suggest any significant adenopathy but will need f/u as planned.   No need for additional w/u fpr now (will explain why when I finish the note and let his pcp know)  - keep f/u appt with me

## 2020-09-29 ENCOUNTER — Encounter: Payer: Self-pay | Admitting: Internal Medicine

## 2020-09-29 DIAGNOSIS — J9611 Chronic respiratory failure with hypoxia: Secondary | ICD-10-CM | POA: Insufficient documentation

## 2020-09-29 NOTE — Assessment & Plan Note (Signed)
Started on 02 08/17/20   On 2-3 lpm 24 /7  Advised:  Make sure you check your oxygen saturation  at your highest level of activity  to be sure it stays over 90% and adjust  02 flow upward to maintain this level if needed but remember to turn it back to previous settings when you stop (to conserve your supply).           Each maintenance medication was reviewed in detail including emphasizing most importantly the difference between maintenance and prns and under what circumstances the prns are to be triggered using an action plan format where appropriate.  Total time for H and P, chart review, counseling, reviewing 02 device(s) and generating customized AVS unique to this office visit / same day charting =30 min

## 2020-09-29 NOTE — Assessment & Plan Note (Signed)
See CT 08/17/20 , not apparent on cxr from 09/23/20   Adenopathy is non specific and likely benign but in the big picture no benefit to any kind of early definitive dx given all the co-morbidities present and can f/u in 3 months in office.   Discussed in detail all the  indications, usual  risks and alternatives  relative to the benefits with patient who agrees to proceed with conservative f/u as outlined

## 2020-09-30 ENCOUNTER — Ambulatory Visit (INDEPENDENT_AMBULATORY_CARE_PROVIDER_SITE_OTHER): Payer: Medicare (Managed Care)

## 2020-09-30 ENCOUNTER — Ambulatory Visit (INDEPENDENT_AMBULATORY_CARE_PROVIDER_SITE_OTHER): Payer: Medicare (Managed Care) | Admitting: Podiatry

## 2020-09-30 ENCOUNTER — Other Ambulatory Visit: Payer: Self-pay

## 2020-09-30 DIAGNOSIS — L97521 Non-pressure chronic ulcer of other part of left foot limited to breakdown of skin: Secondary | ICD-10-CM

## 2020-09-30 DIAGNOSIS — I739 Peripheral vascular disease, unspecified: Secondary | ICD-10-CM | POA: Diagnosis not present

## 2020-09-30 DIAGNOSIS — M869 Osteomyelitis, unspecified: Secondary | ICD-10-CM | POA: Diagnosis not present

## 2020-09-30 MED ORDER — DOXYCYCLINE HYCLATE 100 MG PO TABS: 100.0000 mg | ORAL_TABLET | Freq: Two times a day (BID) | ORAL | 0 refills | Status: DC

## 2020-10-01 ENCOUNTER — Telehealth: Payer: Self-pay | Admitting: Podiatry

## 2020-10-01 NOTE — Telephone Encounter (Signed)
Patients pcp at [facility] treated Gary [name] with doxycyline  05/13-5/23 Per Dr. Leigh Aurora note on 05/10. Provider wanted to know if it was ok to start him again on the Doxycyline that was prescribed yesterday.

## 2020-10-01 NOTE — Telephone Encounter (Signed)
Yes, I would like to continue it.

## 2020-10-05 ENCOUNTER — Other Ambulatory Visit: Payer: Self-pay

## 2020-10-05 ENCOUNTER — Ambulatory Visit (HOSPITAL_COMMUNITY)
Admission: RE | Admit: 2020-10-05 | Discharge: 2020-10-05 | Disposition: A | Discharge status: Home / Self Care | Payer: Medicare (Managed Care) | Source: Ambulatory Visit | Attending: Vascular Surgery | Admitting: Vascular Surgery

## 2020-10-05 ENCOUNTER — Ambulatory Visit
Admission: RE | Admit: 2020-10-05 | Discharge: 2020-10-05 | Disposition: A | Discharge status: Home / Self Care | Payer: Medicare (Managed Care) | Source: Ambulatory Visit | Attending: Physician Assistant | Admitting: Physician Assistant

## 2020-10-05 ENCOUNTER — Ambulatory Visit (INDEPENDENT_AMBULATORY_CARE_PROVIDER_SITE_OTHER): Payer: Medicare (Managed Care) | Admitting: Physician Assistant

## 2020-10-05 VITALS — BP 116/68 | HR 82 | Temp 98.6°F | Resp 20 | Ht 65.0 in | Wt 131.0 lb

## 2020-10-05 DIAGNOSIS — I7025 Atherosclerosis of native arteries of other extremities with ulceration: Secondary | ICD-10-CM | POA: Diagnosis not present

## 2020-10-05 DIAGNOSIS — H903 Sensorineural hearing loss, bilateral: Secondary | ICD-10-CM

## 2020-10-05 DIAGNOSIS — I739 Peripheral vascular disease, unspecified: Secondary | ICD-10-CM | POA: Diagnosis not present

## 2020-10-05 MED ORDER — GADOBENATE DIMEGLUMINE 529 MG/ML IV SOLN
6.0000 mL | Freq: Once | INTRAVENOUS | Status: AC | PRN
  Administered 2020-10-05: 6 mL via INTRAVENOUS

## 2020-10-05 NOTE — H&P (View-Only) (Signed)
Peripheral Arterial Disease Follow-Up   VASCULAR SURGERY ASSESSMENT & PLAN:   Gary [name] is a 65 y.o. male referred by Dr. Mayo Ao for left second toe ulcer.  Peripheral arterial disease with ischemic changes and ulceration of left 2nd toe. S/p right BKA. ABI on left [place].  He has known left popliteal occlusion per Dr. Stephens Shire assessment on January 12, 2020.  Plan aortogram with left lower extremity runoff and possible intervention with Dr. Trula Slade on Tuesday or with next available surgeon.  The patient is in agreement with this plan.  Continue local wound care/prosthetic shoe.  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 Chronic respiratory failure with hypoxia on chronic oxygen supplementation. History of diabetes type 2.  Continue optimal medical management of diabetes, hypertension and follow-up with primary care physician. Encouraged complete smoking cessation. Continue the following medications: statin and aspirin   SUBJECTIVE:   Patient presents with new ulceration of left second toe.  The patient denies lower extremity pain with exercise or rest pain.  Denies fever or chills.  He is compliant with aspirin and statin.  He is diabetic.  PHYSICAL EXAM:   Vitals:   10/05/20 1435  BP: 116/68  Pulse: 82  Resp: 20  Temp: 98.6 F (37 C)  TempSrc: Temporal  SpO2: 96%  Weight: 131 lb (59.4 kg)  Height: 5\' 5"  (1.651 m)    General appearance: Well-developed, well-nourished in no apparent distress.  O2 via nasal cannula in place Neurologic: Alert and oriented x 4. Cardiovascular: Heart rate and rhythm are regular.   Abdomen: No palpable pulsatile mass.  Well-healed midline incision. Extremities: Right BKA with prosthesis in place.  Left foot is warm.  Motor function and sensation intact. Pulse exam: 2+ femoral pulses bilaterally.  Brisk dorsalis pedis and peroneal signals.   Dampened monophasic posterior tibial signal.        NON-INVASIVE VASCULAR STUDIES   10/05/2020 ABIs: Right  Rt Pressure (mmHg)IndexWaveformComment   +--------+------------------+-----+--------+--------+  [id number]                     +--------+------------------+-----+--------+--------+   +---------+------------------+-----+-------------------+-------+  Left   Lt Pressure (mmHg)IndexWaveform      Comment  +---------+------------------+-----+-------------------+-------+  Brachial                       AVF    +---------+------------------+-----+-------------------+-------+  PTA   >254       1.90 dampened monophasic      +---------+------------------+-----+-------------------+-------+  DP    70        0.52 dampened monophasic      +---------+------------------+-----+-------------------+-------+  Great Toe18        0.13 Abnormal            +---------+------------------+-----+-------------------+-------+   +-------+-----------+------------+------------+--------------------+  ABI/TBIToday's ABIToday's TBI Previous ABIPrevious TBI      +-------+-----------+------------+------------+--------------------+  Right BKA          BKA                 +-------+-----------+------------+------------+--------------------+  Left  [place]     0.13 2nd toeNC     great toe amputation  +-------+-----------+------------+------------+--------------------+         Summary:     Right BKA.    Left: Resting left ankle-brachial index indicates noncompressible left  lower extremity arteries. The left 2nd toe-brachial index is abnormal.    PROBLEM LIST:  The patient's past medical history, past surgical history, family history, social history, allergy list and medication list are reviewed.   CURRENT MEDS:     Current Outpatient Medications:  .  acetaminophen (TYLENOL) 500 MG tablet, Take 1,000 mg by mouth 3 (three) times daily as needed for mild pain., Disp: , Rfl:  .  aspirin EC 81 MG tablet, Take 81 mg by mouth every 4 (four) hours as needed., Disp: , Rfl:  .  bisacodyl (DULCOLAX) 5 MG EC tablet, Take 5 mg by mouth daily., Disp: , Rfl:  .  Doxercalciferol (HECTOROL IV), Doxercalciferol (Hectorol), Disp: , Rfl:  .  doxycycline (VIBRA-TABS) 100 MG tablet, Take 1 tablet (100 mg total) by mouth 2 (two) times daily., Disp: 20 tablet, Rfl: 0 .  Dulaglutide (TRULICITY) 5.42 HC/6.2BJ SOPN, Inject 0.75 mg into the skin once a week., Disp: , Rfl:  .  Evolocumab with Infusor ([place]) 420 MG/3.5ML SOCT, Inject 420 mg into the skin every 30 (thirty) days., Disp: , Rfl:  .  famotidine (PEPCID) 40 MG tablet, Take 40 mg by mouth at bedtime., Disp: , Rfl:  .  furosemide (LASIX) 80 MG tablet, Take 80-120 mg by mouth See admin instructions. 120mg  in the morning 80mg  in the afternoon, Disp: , Rfl:  .  metoCLOPramide (REGLAN) 5 MG tablet, Take 5 mg by mouth See admin instructions. 5mg  daily as bedtime And 5mg  three times daily as needed for nausea vomitting, Disp: , Rfl:  .  multivitamin (RENA-VIT) TABS tablet, Take 1 tablet by mouth daily., Disp: , Rfl:  .  mupirocin ointment (BACTROBAN) 2 %, Apply 1 application topically 2 (two) times daily., Disp: 30 g, Rfl: 2 .  Nutritional Supplements (FEEDING SUPPLEMENT, NEPRO CARB STEADY,) LIQD, Take 240 mLs by mouth daily., Disp: , Rfl:  .  pantoprazole (PROTONIX) 40 MG tablet, Take 1 tablet (40 mg total) by mouth daily., Disp: , Rfl:  .  polyethylene glycol (MIRALAX / GLYCOLAX) 17 g packet, Take 17 g by mouth daily., Disp: , Rfl:  .  rosuvastatin (CRESTOR) 20 MG tablet, Take 20 mg by mouth daily. , Disp: , Rfl:  .  senna-docusate (SENOKOT-S) 8.6-50 MG tablet, Take 2 tablets by mouth 2 (two) times daily., Disp: , Rfl:  .  Vitamin D, Ergocalciferol,  (DRISDOL) 1.25 MG (50000 UNIT) CAPS capsule, Take 50,000 Units by mouth every 30 (thirty) days., Disp: , Rfl:    REVIEW OF SYSTEMS:   [X]  denotes positive finding, [ ]  denotes negative finding Cardiac  Comments:  Chest pain or chest pressure:    Shortness of breath upon exertion:    Short of breath when lying flat:    Irregular heart rhythm:        Vascular    Pain in calf, thigh, or hip brought on by ambulation:    Pain in feet at night that wakes you up from your sleep:     Blood clot in your veins:    Leg swelling:         Pulmonary    Oxygen at home:    Productive cough:     Wheezing:         Neurologic    Sudden weakness in arms or legs:     Sudden numbness in arms or legs:     Sudden onset of difficulty speaking or slurred speech:    Temporary loss of vision in one eye:     Problems with dizziness:  Gastrointestinal    Blood in stool:     Vomited blood:         Genitourinary    Burning when urinating:     Blood in urine:        Psychiatric    Major depression:         Hematologic    Bleeding problems:    Problems with blood clotting too easily:        Skin    Rashes or ulcers:        Constitutional    Fever or chills:     Barbie Banner, PA-C  Office: [phone] 10/05/2020   Clinic MD: Dr. Carlis Abbott

## 2020-10-05 NOTE — Progress Notes (Signed)
Subjective: 65 year old male presents the office today for evaluation of left second toe wound.  He thinks the toe is doing somewhat better.  Denies any drainage or pus.  He has no pain.  No new wound is developed since I last saw him. Denies any fevers, chills, nausea, vomiting.  No calf pain, chest pain, shortness of breath at this time.  Objective: AAO x3, NAD Right BKA DP/PT pulses decreased  Ulceration to the lateral aspect of the second toe on the PIPJ with a granular wound base there is no probing to bone, undermining or tunneling.  Wound appears to be smaller but still concerned today as there is swelling to the toe and there is discoloration present to the digit.  There is no ascending cellulitis there is no fluctuation crepitation. No pain with calf compression, swelling, warmth, erythema  Assessment: Ulceration left second toe, PAD  Plan: -All treatment options discussed with the patient including all alternatives, risks, complications.  -Repeat x-rays obtained reviewed which did reveal possible changes to the PIPJ concerning for early osteomyelitis. -Due to the above findings we will restart doxycycline.  Continue daily dressing changes.  He is scheduled to follow-up with vascular surgery as well.  Trula Slade DPM

## 2020-10-05 NOTE — Progress Notes (Signed)
Peripheral Arterial Disease Follow-Up   VASCULAR SURGERY ASSESSMENT & PLAN:   Gary [name] is a 64 y.o. male referred by Dr. Mayo Ao for left second toe ulcer.  Peripheral arterial disease with ischemic changes and ulceration of left 2nd toe. S/p right BKA. ABI on left [place].  He has known left popliteal occlusion per Dr. Stephens Shire assessment on January 12, 2020.  Plan aortogram with left lower extremity runoff and possible intervention with Dr. Trula Slade on Tuesday or with next available surgeon.  The patient is in agreement with this plan.  Continue local wound care/prosthetic shoe.  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 Chronic respiratory failure with hypoxia on chronic oxygen supplementation. History of diabetes type 2.  Continue optimal medical management of diabetes, hypertension and follow-up with primary care physician. Encouraged complete smoking cessation. Continue the following medications: statin and aspirin   SUBJECTIVE:   Patient presents with new ulceration of left second toe.  The patient denies lower extremity pain with exercise or rest pain.  Denies fever or chills.  He is compliant with aspirin and statin.  He is diabetic.  PHYSICAL EXAM:   Vitals:   10/05/20 1435  BP: 116/68  Pulse: 82  Resp: 20  Temp: 98.6 F (37 C)  TempSrc: Temporal  SpO2: 96%  Weight: 131 lb (59.4 kg)  Height: 5\' 5"  (1.651 m)    General appearance: Well-developed, well-nourished in no apparent distress.  O2 via nasal cannula in place Neurologic: Alert and oriented x 4. Cardiovascular: Heart rate and rhythm are regular.   Abdomen: No palpable pulsatile mass.  Well-healed midline incision. Extremities: Right BKA with prosthesis in place.  Left foot is warm.  Motor function and sensation intact. Pulse exam: 2+ femoral pulses bilaterally.  Brisk dorsalis pedis and peroneal signals.   Dampened monophasic posterior tibial signal.        NON-INVASIVE VASCULAR STUDIES   10/05/2020 ABIs: Right  Rt Pressure (mmHg)IndexWaveformComment   +--------+------------------+-----+--------+--------+  [id number]                     +--------+------------------+-----+--------+--------+   +---------+------------------+-----+-------------------+-------+  Left   Lt Pressure (mmHg)IndexWaveform      Comment  +---------+------------------+-----+-------------------+-------+  Brachial                       AVF    +---------+------------------+-----+-------------------+-------+  PTA   >254       1.90 dampened monophasic      +---------+------------------+-----+-------------------+-------+  DP    70        0.52 dampened monophasic      +---------+------------------+-----+-------------------+-------+  Great Toe18        0.13 Abnormal            +---------+------------------+-----+-------------------+-------+   +-------+-----------+------------+------------+--------------------+  ABI/TBIToday's ABIToday's TBI Previous ABIPrevious TBI      +-------+-----------+------------+------------+--------------------+  Right BKA          BKA                 +-------+-----------+------------+------------+--------------------+  Left  [place]     0.13 2nd toeNC     great toe amputation  +-------+-----------+------------+------------+--------------------+         Summary:     Right BKA.    Left: Resting left ankle-brachial index indicates noncompressible left  lower extremity arteries. The left 2nd toe-brachial index is abnormal.    PROBLEM LIST:  The patient's past medical history, past surgical history, family history, social history, allergy list and medication list are reviewed.   CURRENT MEDS:     Current Outpatient Medications:  .  acetaminophen (TYLENOL) 500 MG tablet, Take 1,000 mg by mouth 3 (three) times daily as needed for mild pain., Disp: , Rfl:  .  aspirin EC 81 MG tablet, Take 81 mg by mouth every 4 (four) hours as needed., Disp: , Rfl:  .  bisacodyl (DULCOLAX) 5 MG EC tablet, Take 5 mg by mouth daily., Disp: , Rfl:  .  Doxercalciferol (HECTOROL IV), Doxercalciferol (Hectorol), Disp: , Rfl:  .  doxycycline (VIBRA-TABS) 100 MG tablet, Take 1 tablet (100 mg total) by mouth 2 (two) times daily., Disp: 20 tablet, Rfl: 0 .  Dulaglutide (TRULICITY) 3.47 QQ/5.9DG SOPN, Inject 0.75 mg into the skin once a week., Disp: , Rfl:  .  Evolocumab with Infusor ([place]) 420 MG/3.5ML SOCT, Inject 420 mg into the skin every 30 (thirty) days., Disp: , Rfl:  .  famotidine (PEPCID) 40 MG tablet, Take 40 mg by mouth at bedtime., Disp: , Rfl:  .  furosemide (LASIX) 80 MG tablet, Take 80-120 mg by mouth See admin instructions. 120mg  in the morning 80mg  in the afternoon, Disp: , Rfl:  .  metoCLOPramide (REGLAN) 5 MG tablet, Take 5 mg by mouth See admin instructions. 5mg  daily as bedtime And 5mg  three times daily as needed for nausea vomitting, Disp: , Rfl:  .  multivitamin (RENA-VIT) TABS tablet, Take 1 tablet by mouth daily., Disp: , Rfl:  .  mupirocin ointment (BACTROBAN) 2 %, Apply 1 application topically 2 (two) times daily., Disp: 30 g, Rfl: 2 .  Nutritional Supplements (FEEDING SUPPLEMENT, NEPRO CARB STEADY,) LIQD, Take 240 mLs by mouth daily., Disp: , Rfl:  .  pantoprazole (PROTONIX) 40 MG tablet, Take 1 tablet (40 mg total) by mouth daily., Disp: , Rfl:  .  polyethylene glycol (MIRALAX / GLYCOLAX) 17 g packet, Take 17 g by mouth daily., Disp: , Rfl:  .  rosuvastatin (CRESTOR) 20 MG tablet, Take 20 mg by mouth daily. , Disp: , Rfl:  .  senna-docusate (SENOKOT-S) 8.6-50 MG tablet, Take 2 tablets by mouth 2 (two) times daily., Disp: , Rfl:  .  Vitamin D, Ergocalciferol,  (DRISDOL) 1.25 MG (50000 UNIT) CAPS capsule, Take 50,000 Units by mouth every 30 (thirty) days., Disp: , Rfl:    REVIEW OF SYSTEMS:   [X]  denotes positive finding, [ ]  denotes negative finding Cardiac  Comments:  Chest pain or chest pressure:    Shortness of breath upon exertion:    Short of breath when lying flat:    Irregular heart rhythm:        Vascular    Pain in calf, thigh, or hip brought on by ambulation:    Pain in feet at night that wakes you up from your sleep:     Blood clot in your veins:    Leg swelling:         Pulmonary    Oxygen at home:    Productive cough:     Wheezing:         Neurologic    Sudden weakness in arms or legs:     Sudden numbness in arms or legs:     Sudden onset of difficulty speaking or slurred speech:    Temporary loss of vision in one eye:     Problems with dizziness:  Gastrointestinal    Blood in stool:     Vomited blood:         Genitourinary    Burning when urinating:     Blood in urine:        Psychiatric    Major depression:         Hematologic    Bleeding problems:    Problems with blood clotting too easily:        Skin    Rashes or ulcers:        Constitutional    Fever or chills:     Barbie Banner, PA-C  Office: [phone] 10/05/2020   Clinic MD: Dr. Carlis Abbott

## 2020-10-05 NOTE — Telephone Encounter (Signed)
Called and spoke to [place] with PCP office. Informed her of the recs per MW. Pt verbalized understanding and denied any further questions or concerns at this time.    Per Dr. Gustavus Bryant notes he changed pt's OV from a 6 month ROV to a "3 months with cxr". LMTCB for pt.

## 2020-10-06 ENCOUNTER — Ambulatory Visit: Payer: Medicare (Managed Care)

## 2020-10-06 NOTE — Telephone Encounter (Signed)
Called and spoke to pt. Pt has been scheduled for the 3 month ROV for December 21, 2020 with CXR. Pt verbalized understanding and denied any further questions or concerns at this time.

## 2020-10-12 ENCOUNTER — Ambulatory Visit (HOSPITAL_COMMUNITY)
Admission: RE | Admit: 2020-10-12 | Discharge: 2020-10-12 | Disposition: A | Discharge status: Home / Self Care | Payer: Medicare (Managed Care) | Attending: Surgery | Admitting: Surgery

## 2020-10-12 ENCOUNTER — Ambulatory Visit (HOSPITAL_COMMUNITY)
Admission: RE | Disposition: A | Discharge status: Home / Self Care | Payer: Self-pay | Source: Home / Self Care | Attending: Surgery

## 2020-10-12 DIAGNOSIS — L97529 Non-pressure chronic ulcer of other part of left foot with unspecified severity: Secondary | ICD-10-CM | POA: Diagnosis not present

## 2020-10-12 DIAGNOSIS — I70245 Atherosclerosis of native arteries of left leg with ulceration of other part of foot: Secondary | ICD-10-CM | POA: Diagnosis not present

## 2020-10-12 DIAGNOSIS — E1151 Type 2 diabetes mellitus with diabetic peripheral angiopathy without gangrene: Secondary | ICD-10-CM | POA: Insufficient documentation

## 2020-10-12 DIAGNOSIS — Z794 Long term (current) use of insulin: Secondary | ICD-10-CM | POA: Diagnosis not present

## 2020-10-12 DIAGNOSIS — Z7982 Long term (current) use of aspirin: Secondary | ICD-10-CM | POA: Diagnosis not present

## 2020-10-12 DIAGNOSIS — Z992 Dependence on renal dialysis: Secondary | ICD-10-CM | POA: Diagnosis not present

## 2020-10-12 DIAGNOSIS — E11621 Type 2 diabetes mellitus with foot ulcer: Secondary | ICD-10-CM | POA: Insufficient documentation

## 2020-10-12 DIAGNOSIS — I120 Hypertensive chronic kidney disease with stage 5 chronic kidney disease or end stage renal disease: Secondary | ICD-10-CM | POA: Insufficient documentation

## 2020-10-12 DIAGNOSIS — N186 End stage renal disease: Secondary | ICD-10-CM | POA: Insufficient documentation

## 2020-10-12 DIAGNOSIS — Z79899 Other long term (current) drug therapy: Secondary | ICD-10-CM | POA: Insufficient documentation

## 2020-10-12 DIAGNOSIS — E1122 Type 2 diabetes mellitus with diabetic chronic kidney disease: Secondary | ICD-10-CM | POA: Diagnosis not present

## 2020-10-12 DIAGNOSIS — I7092 Chronic total occlusion of artery of the extremities: Secondary | ICD-10-CM | POA: Diagnosis not present

## 2020-10-12 HISTORY — PX: ABDOMINAL AORTOGRAM W/LOWER EXTREMITY: CATH118223

## 2020-10-12 LAB — POCT I-STAT, CHEM 8
BUN: 65 mg/dL — ABNORMAL HIGH (ref 8–23)
Calcium, Ion: 0.97 mmol/L — ABNORMAL LOW (ref 1.15–1.40)
Chloride: 95 mmol/L — ABNORMAL LOW (ref 98–111)
Creatinine, Ser: 6.9 mg/dL — ABNORMAL HIGH (ref 0.61–1.24)
Glucose, Bld: 204 mg/dL — ABNORMAL HIGH (ref 70–99)
HCT: 36 % — ABNORMAL LOW (ref 39.0–52.0)
Hemoglobin: 12.2 g/dL — ABNORMAL LOW (ref 13.0–17.0)
Potassium: 4.7 mmol/L (ref 3.5–5.1)
Sodium: 135 mmol/L (ref 135–145)
TCO2: 34 mmol/L — ABNORMAL HIGH (ref 22–32)

## 2020-10-12 LAB — GLUCOSE, CAPILLARY: Glucose-Capillary: 146 mg/dL — ABNORMAL HIGH (ref 70–99)

## 2020-10-12 SURGERY — ABDOMINAL AORTOGRAM W/LOWER EXTREMITY
Anesthesia: LOCAL

## 2020-10-12 MED ORDER — HYDRALAZINE HCL 20 MG/ML IJ SOLN: 5.0000 mg | INTRAMUSCULAR | Status: DC | PRN

## 2020-10-12 MED ORDER — LIDOCAINE HCL (PF) 1 % IJ SOLN
INTRAMUSCULAR | Status: AC
  Filled 2020-10-12: qty 30

## 2020-10-12 MED ORDER — SODIUM CHLORIDE 0.9 % IV SOLN: 250.0000 mL | INTRAVENOUS | Status: DC | PRN

## 2020-10-12 MED ORDER — FENTANYL CITRATE (PF) 100 MCG/2ML IJ SOLN
INTRAMUSCULAR | Status: DC | PRN
  Administered 2020-10-12: 50 ug via INTRAVENOUS

## 2020-10-12 MED ORDER — FENTANYL CITRATE (PF) 100 MCG/2ML IJ SOLN
INTRAMUSCULAR | Status: AC
  Filled 2020-10-12: qty 2

## 2020-10-12 MED ORDER — SODIUM CHLORIDE 0.9% FLUSH: 3.0000 mL | Freq: Two times a day (BID) | INTRAVENOUS | Status: DC

## 2020-10-12 MED ORDER — MORPHINE SULFATE (PF) 2 MG/ML IV SOLN: 2.0000 mg | INTRAVENOUS | Status: DC | PRN

## 2020-10-12 MED ORDER — SODIUM CHLORIDE 0.9% FLUSH: 3.0000 mL | INTRAVENOUS | Status: DC | PRN

## 2020-10-12 MED ORDER — MIDAZOLAM HCL 5 MG/5ML IJ SOLN
INTRAMUSCULAR | Status: AC
  Filled 2020-10-12: qty 5

## 2020-10-12 MED ORDER — ONDANSETRON HCL 4 MG/2ML IJ SOLN: 4.0000 mg | Freq: Four times a day (QID) | INTRAMUSCULAR | Status: DC | PRN

## 2020-10-12 MED ORDER — HEPARIN (PORCINE) IN NACL 1000-0.9 UT/500ML-% IV SOLN
INTRAVENOUS | Status: AC
  Filled 2020-10-12: qty 1000

## 2020-10-12 MED ORDER — LABETALOL HCL 5 MG/ML IV SOLN: 10.0000 mg | INTRAVENOUS | Status: DC | PRN

## 2020-10-12 MED ORDER — MIDAZOLAM HCL 2 MG/2ML IJ SOLN
INTRAMUSCULAR | Status: DC | PRN
  Administered 2020-10-12 (×2): 1 mg via INTRAVENOUS

## 2020-10-12 MED ORDER — LIDOCAINE HCL (PF) 1 % IJ SOLN
INTRAMUSCULAR | Status: DC | PRN
  Administered 2020-10-12: 15 mL via INTRADERMAL

## 2020-10-12 MED ORDER — HEPARIN (PORCINE) IN NACL 1000-0.9 UT/500ML-% IV SOLN
INTRAVENOUS | Status: DC | PRN
  Administered 2020-10-12 (×2): 500 mL

## 2020-10-12 MED ORDER — IODIXANOL 320 MG/ML IV SOLN
INTRAVENOUS | Status: DC | PRN
  Administered 2020-10-12: 100 mL via INTRA_ARTERIAL

## 2020-10-12 SURGICAL SUPPLY — 16 items
CATH NAVICROSS ANG 65CM (CATHETERS) ×1 IMPLANT
CATH OMNI FLUSH 5F 65CM (CATHETERS) ×2 IMPLANT
CATH SOFTOUCH MOTARJEME 5F (CATHETERS) ×2 IMPLANT
CATHETER NAVICROSS ANG 65CM (CATHETERS) ×2
COVER PRB 48X5XTLSCP FOLD TPE (BAG) ×1 IMPLANT
COVER PROBE 5X48 (BAG) ×2
DEVICE VASC CLSR CELT ART 5 (Vascular Products) ×2 IMPLANT
GLIDEWIRE ADV .035X260CM (WIRE) ×2 IMPLANT
KIT MICROPUNCTURE NIT STIFF (SHEATH) ×2 IMPLANT
KIT PV (KITS) ×2 IMPLANT
SHEATH PINNACLE 5F 10CM (SHEATH) ×2 IMPLANT
STOPCOCK MORSE 400PSI 3WAY (MISCELLANEOUS) ×2 IMPLANT
SYR MEDRAD MARK 7 150ML (SYRINGE) ×2 IMPLANT
TRANSDUCER W/STOPCOCK (MISCELLANEOUS) ×2 IMPLANT
TRAY PV CATH (CUSTOM PROCEDURE TRAY) ×2 IMPLANT
WIRE STARTER BENTSON 035X150 (WIRE) ×2 IMPLANT

## 2020-10-12 NOTE — Op Note (Addendum)
    Patient name: Gary [name] MRN: [id number] DOB: 02/26/1956 Sex: male  10/12/2020 Pre-operative Diagnosis: Left second toe ulcer Post-operative diagnosis:  Same Surgeon:  Annamarie Major Procedure Performed:  1.  Ultrasound-guided access, right femoral artery  2.  Abdominal aortogram  3.  First order catheterization  4.  Left lower extremity runoff  5.  Conscious sedation, 54 minutes  6.  Closure device ([place])    Indications: This is a 65 year old gentleman with history of aortobifemoral bypass graft.  He has an issue with his left second toe.  He comes in today for angiographic evaluation.  Procedure:  The patient was identified in the holding area and taken to room 8.  The patient was then placed supine on the table and prepped and draped in the usual sterile fashion.  A time out was called.  Conscious sedation was administered with the use of IV fentanyl and Versed under continuous physician and nurse monitoring.  Heart rate, blood pressure, and oxygen saturation were continuously monitored.  Total sedation time was 54 minutes.  Ultrasound was used to evaluate the right common femoral artery.  It was patent .  A digital ultrasound image was acquired.  A micropuncture needle was used to access the right common femoral artery under ultrasound guidance.  An 018 wire was advanced without resistance and a micropuncture sheath was placed.  The 018 wire was removed and a benson wire was placed.  The micropuncture sheath was exchanged for a 5 french sheath.  An omniflush catheter was advanced over the wire to the level of L-1.  An abdominal angiogram was obtained.  Next, using the omniflush catheter and a benson wire, the aortic bifurcation was crossed and the catheter was placed into theright common iliac artery and right runoff was obtained.    Findings:   Aortogram: No significant renal artery stenosis.  An aortobifemoral bypass graft is visualized coming off of the infrarenal aorta and a  end-to-side configuration.  Both limbs of the graft are widely patent.  Right Lower Extremity: Not evaluated  Left Lower Extremity: The left common femoral and profundofemoral artery are patent throughout their course.  There is mild to moderate diffuse long segment disease within the superficial femoral artery.  The popliteal artery is occluded above the knee with reconstitution of the peroneal artery which is the single-vessel runoff.  There is collateralization and reconstitution of the posterior tibial artery in the lower leg however this does not fully perfuse the foot as there is small vessel disease.  Intervention: None  Impression:  #1  No significant aortoiliac occlusive disease  #2  Left popliteal occlusion with reconstitution of the peroneal artery which is his single-vessel runoff  #3  Patient will need to be evaluated for a left femoral peroneal bypass.  Dr Earleen Newport will perform the toe amputation.      Theotis Burrow, M.D., [facility] Office: [phone] Pager:  [phone]

## 2020-10-12 NOTE — Interval H&P Note (Signed)
History and Physical Interval Note:  10/12/2020 1:00 PM  Gary [name]  has presented today for surgery, with the diagnosis of pad - toe ulcer.  The various methods of treatment have been discussed with the patient and family. After consideration of risks, benefits and other options for treatment, the patient has consented to  Procedure(s): ABDOMINAL AORTOGRAM W/LOWER EXTREMITY (N/A) as a surgical intervention.  The patient's history has been reviewed, patient examined, no change in status, stable for surgery.  I have reviewed the patient's chart and labs.  Questions were answered to the patient's satisfaction.     Annamarie Major

## 2020-10-13 ENCOUNTER — Other Ambulatory Visit: Payer: Self-pay | Admitting: *Deleted

## 2020-10-13 ENCOUNTER — Encounter (HOSPITAL_COMMUNITY): Payer: Self-pay | Admitting: Surgery

## 2020-10-13 DIAGNOSIS — I739 Peripheral vascular disease, unspecified: Secondary | ICD-10-CM

## 2020-10-13 DIAGNOSIS — I7025 Atherosclerosis of native arteries of other extremities with ulceration: Secondary | ICD-10-CM

## 2020-10-13 MED FILL — Midazolam HCl Inj 5 MG/5ML (Base Equivalent): INTRAMUSCULAR | Qty: 2 | Status: AC

## 2020-10-14 ENCOUNTER — Ambulatory Visit (INDEPENDENT_AMBULATORY_CARE_PROVIDER_SITE_OTHER): Payer: Medicare (Managed Care) | Admitting: Internal Medicine

## 2020-10-14 ENCOUNTER — Other Ambulatory Visit: Payer: Self-pay

## 2020-10-14 ENCOUNTER — Encounter: Payer: Self-pay | Admitting: Internal Medicine

## 2020-10-14 ENCOUNTER — Ambulatory Visit (INDEPENDENT_AMBULATORY_CARE_PROVIDER_SITE_OTHER): Payer: Medicare (Managed Care) | Admitting: Podiatry

## 2020-10-14 VITALS — BP 110/60 | HR 62 | Ht 66.0 in | Wt 133.6 lb

## 2020-10-14 DIAGNOSIS — I739 Peripheral vascular disease, unspecified: Secondary | ICD-10-CM | POA: Diagnosis not present

## 2020-10-14 DIAGNOSIS — Z01818 Encounter for other preprocedural examination: Secondary | ICD-10-CM

## 2020-10-14 DIAGNOSIS — M869 Osteomyelitis, unspecified: Secondary | ICD-10-CM

## 2020-10-14 DIAGNOSIS — I700 Atherosclerosis of aorta: Secondary | ICD-10-CM

## 2020-10-14 DIAGNOSIS — Z992 Dependence on renal dialysis: Secondary | ICD-10-CM

## 2020-10-14 DIAGNOSIS — N186 End stage renal disease: Secondary | ICD-10-CM

## 2020-10-14 DIAGNOSIS — R0609 Other forms of dyspnea: Secondary | ICD-10-CM

## 2020-10-14 DIAGNOSIS — I447 Left bundle-branch block, unspecified: Secondary | ICD-10-CM

## 2020-10-14 DIAGNOSIS — R0600 Dyspnea, unspecified: Secondary | ICD-10-CM

## 2020-10-14 NOTE — Patient Instructions (Signed)
Medication Instructions:  Your physician recommends that you continue on your current medications as directed. Please refer to the Current Medication list given to you today.  *If you need a refill on your cardiac medications before your next appointment, please call your pharmacy*   Lab Work: NONE If you have labs (blood work) drawn today and your tests are completely normal, you will receive your results only by: [place] (if you have MyChart) OR A paper copy in the mail If you have any lab test that is abnormal or we need to change your treatment, we will call you to review the results.   Testing/Procedures: Your physician has requested that you have a lexiscan myoview. For further information please visit [email or web address]. Please follow instruction sheet, as given.    Follow-Up: At [facility], you and your health needs are our priority.  As part of our continuing mission to provide you with exceptional heart care, we have created designated Provider Care Teams.  These Care Teams include your primary Cardiologist (physician) and Advanced Practice Providers (APPs -  Physician Assistants and Nurse Practitioners) who all work together to provide you with the care you need, when you need it.  We recommend signing up for the patient portal called "MyChart".  Sign up information is provided on this After Visit Summary.  MyChart is used to connect with patients for Virtual Visits (Telemedicine).  Patients are able to view lab/test results, encounter notes, upcoming appointments, etc.  Non-urgent messages can be sent to your provider as well.   To learn more about what you can do with MyChart, go to [email or web address].    Your next appointment:   1-2 month(s)  The format for your next appointment:   In Person  Provider:   You may see Rudean Haskell, MD or one of the following Advanced Practice Providers on your designated Care Team:   Melina Copa, PA-C Ermalinda Barrios, PA-C

## 2020-10-14 NOTE — Progress Notes (Signed)
Cardiology Office Note:    Date:  10/14/2020   ID:  Gary [name], DOB [birth year], MRN [id number]  PCP:  Gary Adie, MD   [place] Prov[facility] Cardiologist:  Gary Lean, MD     Referring MD: Gary Adie, MD  CC: Told to come here Consulted for the evaluation; unclear at the behest of Gary Adie, MD  Vascular surgery pre-operative risk assessment.  History of Present Illness:    Gary [name] is a 65 y.o. male with a hx of HTN, DM, ESRD, PAD with prior BKA, seen by VVS and tobacco use, LBBB Aortic Atherosclerosis and HLD and extensive CAC, who presents 10/14/20.  Patient notes that he is feeling good.  Has had no chest pain, chest pressure, chest tightness, chest stinging.  Patient exertion notable for sitting around the house or coming to the appointment with DOE. Notes PND and Orthopnea.  Notes bendopnea.  Doesn't still make urine. Denies weight gain, leg swelling, or abdominal swelling.  No syncope or near syncope . Notes  no palpitations or funny heart beats.     Through chart review planned for arterial bypass and toe amputation (left leg)   Past Medical History:  Diagnosis Date   Blurred vision    Chronic kidney disease    sees [facility]   Constipation    Constipation    Diabetes mellitus    type 2   GERD (gastroesophageal reflux disease)    High cholesterol    Hypertension    Insomnia    Peripheral artery disease (HCC)    Port-wine stain of face    Left V1 distribution, including upper eyelid   Sensorineural hearing loss    left ear   Vitamin D deficiency     Past Surgical History:  Procedure Laterality Date   A/V FISTULAGRAM Left 01/20/2020   Procedure: A/V FISTULAGRAM;  Surgeon: Gary Mitchell, MD;  Location: [facility];  Service: Cardiovascular;  Laterality: Left;   ABDOMINAL AORTOGRAM W/LOWER EXTREMITY N/A 10/12/2020   Procedure: ABDOMINAL AORTOGRAM W/LOWER EXTREMITY;  Surgeon: Gary Mitchell, MD;   Location: [facility];  Service: Cardiovascular;  Laterality: N/A;   AMPUTATION Right 07/17/2011   AMPUTATION Left 10/03/2012   Procedure: AMPUTATION DIGIT;  Surgeon: Gary Mitchell, MD;  Location: [facility];  Service: Vascular;  Laterality: Left;  Amputation of left Great  toe   AV FISTULA PLACEMENT Left 08/30/2017   Procedure: CREATION OF LEFT BRACHIOCEPHALIC ARTERIOVENOUS FISTULA;  Surgeon: Gary Mitchell, MD;  Location: [place];  Service: Vascular;  Laterality: Left;   COLONOSCOPY     PERIPHERAL ARTERIAL STENT GRAFT N/A 2010   STUMP REVISION Right 01/28/2016   Procedure: Revision Right Below Knee Amputation;  Surgeon: Gary Minion, MD;  Location: [facility];  Service: Orthopedics;  Laterality: Right;    Current Medications: Current Meds  Medication Sig   acetaminophen (TYLENOL) 500 MG tablet Take 1,000 mg by mouth 3 (three) times daily as needed for mild pain.   albuterol (VENTOLIN HFA) 108 (90 Base) MCG/ACT inhaler Inhale 2 puffs into the lungs every 6 (six) hours as needed for wheezing or shortness of breath.   aspirin 81 MG chewable tablet Chew 81 mg by mouth daily.   bisacodyl (DULCOLAX) 5 MG EC tablet Take 5 mg by mouth as needed.   Doxercalciferol (HECTOROL IV) Doxercalciferol (Hectorol)   Dulaglutide (TRULICITY) 3 OY/7.7AJ SOPN Inject 3 mg into the skin once a week.  Evolocumab with Infusor ([place]) 420 MG/3.5ML SOCT Inject 420 mg into the skin every 30 (thirty) days.   famotidine (PEPCID) 40 MG tablet Take 40 mg by mouth as needed.   multivitamin (RENA-VIT) TABS tablet Take 1 tablet by mouth daily.   mupirocin ointment (BACTROBAN) 2 % Apply 1 application topically 2 (two) times daily. (Patient taking differently: Apply 1 application topically as needed.)   Nutritional Supplements (FEEDING SUPPLEMENT, NEPRO CARB STEADY,) LIQD Take 240 mLs by mouth daily.   polyethylene glycol (MIRALAX / GLYCOLAX) 17 g packet Take 17 g by mouth as needed.   rosuvastatin (CRESTOR)  20 MG tablet Take 20 mg by mouth daily.    senna-docusate (SENOKOT-S) 8.6-50 MG tablet Take 2 tablets by mouth as needed.   umeclidinium-vilanterol (ANORO ELLIPTA) 62.5-25 MCG/INH AEPB Inhale 1 puff into the lungs daily.   Vitamin D, Ergocalciferol, (DRISDOL) 1.25 MG (50000 UNIT) CAPS capsule Take 50,000 Units by mouth every 30 (thirty) days.     Allergies:   Oxycodone-acetaminophen   Social History   Socioeconomic History   Marital status: Married    Spouse name: Not on file   Number of children: Not on file   Years of education: Not on file   Highest education level: Not on file  Occupational History   Not on file  Tobacco Use   Smoking status: Every Day    Packs/day: 2.00    Years: 50.00    Pack years: 100.00    Types: Cigarettes   Smokeless tobacco: Never   Tobacco comments:    4 cigs per day 09/21/20//lmr  Vaping Use   Vaping Use: Never used  Substance and Sexual Activity   Alcohol use: No    Alcohol/week: 0.0 standard drinks   Drug use: No   Sexual activity: Not on file  Other Topics Concern   Not on file  Social History Narrative   Not on file   Social Determinants of Health   Financial Resource Strain: Not on file  Food Insecurity: Not on file  Transportation Needs: Not on file  Physical Activity: Not on file  Stress: Not on file  Social Connections: Not on file     Family History: The patient's family history includes Cancer in his father; Diabetes in his mother; Hypertension in his mother. There is no history of Colon cancer.  ROS:   Please see the history of present illness.     All other systems reviewed and are negative.  EKGs/Labs/Other Studies Reviewed:    The following studies were reviewed today:  EKG:   08/18/20: SR 82 LBBB  CTPE: Date: 08/17/20 Results: Extensive Coronary Calcifications in LM, LAD, Lcx, RCA   Recent Labs: 08/17/2020: B Natriuretic Peptide >4,500.0 08/24/2020: Platelets 256 10/12/2020: BUN 65; Creatinine, Ser 6.90;  Hemoglobin 12.2; Potassium 4.7; Sodium 135  Recent Lipid Panel    Component Value Date/Time   CHOL 224 (H) 01/18/2010 2038   TRIG 69 01/18/2010 2038   HDL 51 01/18/2010 2038   CHOLHDL 4.4 Ratio 01/18/2010 2038   VLDL 14 01/18/2010 2038   LDLCALC 159 (H) 01/18/2010 2038     Risk Assessment/Calculations:     N/A      Physical Exam:    VS:  BP 110/60   Pulse 62   Ht 5\' 6"  (1.676 m)   Wt 60.6 kg   SpO2 100%   BMI 21.56 kg/m     Wt Readings from Last 3 Encounters:  10/14/20 60.6 kg  10/12/20  61.2 kg  10/05/20 59.4 kg     GEN:  Well nourished, well developed on oxygen HEENT: Normal NECK: No JVD LYMPHATICS: No lymphadenopathy CARDIAC: RRR, cardiac murmur suggestive of referred AV fistula RESPIRATORY:  Clear to auscultation without rales, wheezing or rhonchi  ABDOMEN: Soft, non-tender, non-distended MUSCULOSKELETAL:  +2 left leg edema R BKA site c/d/i NEUROLOGIC:  Alert and oriented x 3  PSYCHIATRIC:  Normal affect   ASSESSMENT:    1. Preoperative clearance   2. DOE (dyspnea on exertion)   3. ESRD on hemodialysis ([place])   4. PAD (peripheral artery disease) ([place])   5. Aortic atherosclerosis ([place])   6. LBBB (left bundle branch block)    PLAN:    Preoperative Risk Assessment HTN & DM ESRD PAD with prior BKA seen by VVS Tobacco use- down to two cigarettes a day (discussed with patient who is amenable to stop prior to peroneal bypass) LBBB Aortic Atherosclerosis and CAC - The Revised Cardiac Risk Index = 3 (high risk surgery  suprainguinal vascular), DM on insulin per patient, Scr >2), which equates to =>11%; high risk estimated risk of perioperative myocardial infarction, pulmonary edema, ventricular fibrillation, cardiac arrest, or complete heart block.  - DASI score of 4.5 associated with 3.3 functional mets - Due to low functional METS, high calcium burden, and risk factors Lexiscan  is recommended prior to proceeding with their planned procedure.  - continue  ASA 81 mg PO Daily, Continue Repatha, continue crestor 20 mg PO daily  - appears volume up will CC For Dr. Jimmy Footman as may need lower dry weight  Time Spent Directly with Patient:   I have spent a total of 60 minutes with the patient reviewing notes, imaging, EKGs, labs and examining the patient as well as establishing an assessment and plan that was discussed personally with the patient.  > 50% of time was spent in direct patient care, reaching out to his medical team to assist in coordinating care.    Shared Decision Making/Informed Consent The risks [chest pain, shortness of breath, cardiac arrhythmias, dizziness, blood pressure fluctuations, myocardial infarction, stroke/transient ischemic attack, nausea, vomiting, allergic reaction, radiation exposure, metallic taste sensation and life-threatening complications (estimated to be 1 in 10,000)], benefits (risk stratification, diagnosing coronary artery disease, treatment guidance) and alternatives of a nuclear stress test were discussed in detail with Mr. Shehadeh and he agrees to proceed.    Medication Adjustments/Labs and Tests Ordered: Current medicines are reviewed at length with the patient today.  Concerns regarding medicines are outlined above.  Orders Placed This Encounter  Procedures   MYOCARDIAL PERFUSION IMAGING    No orders of the defined types were placed in this encounter.   Patient Instructions  Medication Instructions:  Your physician recommends that you continue on your current medications as directed. Please refer to the Current Medication list given to you today.  *If you need a refill on your cardiac medications before your next appointment, please call your pharmacy*   Lab Work: NONE If you have labs (blood work) drawn today and your tests are completely normal, you will receive your results only by: [place] (if you have MyChart) OR A paper copy in the mail If you have any lab test that is abnormal or  we need to change your treatment, we will call you to review the results.   Testing/Procedures: Your physician has requested that you have a lexiscan myoview. For further information please visit [email or web address]. Please follow instruction sheet, as given.  Follow-Up: At [facility], you and your health needs are our priority.  As part of our continuing mission to provide you with exceptional heart care, we have created designated Provider Care Teams.  These Care Teams include your primary Cardiologist (physician) and Advanced Practice Providers (APPs -  Physician Assistants and Nurse Practitioners) who all work together to provide you with the care you need, when you need it.  We recommend signing up for the patient portal called "MyChart".  Sign up information is provided on this After Visit Summary.  MyChart is used to connect with patients for Virtual Visits (Telemedicine).  Patients are able to view lab/test results, encounter notes, upcoming appointments, etc.  Non-urgent messages can be sent to your provider as well.   To learn more about what you can do with MyChart, go to [email or web address].    Your next appointment:   1-2 month(s)  The format for your next appointment:   In Person  Provider:   You may see Rudean Haskell, MD or one of the following Advanced Practice Providers on your designated Care Team:   Melina Copa, PA-C Ermalinda Barrios, PA-C      Signed, Gary Lean, MD  10/14/2020 5:26 PM    [place]

## 2020-10-15 NOTE — Addendum Note (Signed)
Addended by: Rudean Haskell A on: 10/15/2020 01:23 PM   Modules accepted: Orders

## 2020-10-15 NOTE — Addendum Note (Signed)
Addended by: Precious Gilding on: 10/15/2020 01:17 PM   Modules accepted: Orders

## 2020-10-18 ENCOUNTER — Telehealth: Payer: Self-pay | Admitting: *Deleted

## 2020-10-18 ENCOUNTER — Telehealth (HOSPITAL_COMMUNITY): Payer: Self-pay | Admitting: *Deleted

## 2020-10-18 NOTE — Progress Notes (Signed)
Subjective: 65 year old male presents the office today for evaluation of left second toe wound.  He is follow-up with vascular disease unfortunately bypass and likely to amputation.  Denies any changes otherwise.  Denies any fevers, chills, nausea, vomiting.   Objective: AAO x3, NAD Right BKA DP/PT pulses decreased  Ulceration to the lateral aspect of the second toe on the PIPJ with a granular wound base and today unfortunately wound does probe to bone there is localized edema and hyperpigmentation to the toe but there is no ascending cellulitis.  There is no fluctuation crepitation but there is no malodor.  No pain with calf compression, swelling, warmth, erythema  Assessment: Ulceration left second toe/osteomyelitis, PAD  Plan: -All treatment options discussed with the patient including all alternatives, risks, complications.  -At this point the wound does probe to bone and given chronic discoloration to the toe recommend amputation of the digit.  Was going to undergo bypass with vascular surgery likely undergo the toe rotation the same time.  I discussed with him that amputation as well as postoperative course including alternatives risks and complications.  We discussed risks of surgery including spreading infection, further amputation, delayed or nonhealing, loss of foot, leg as well as general risks of surgery.  Consent was signed today.  Trula Slade DPM

## 2020-10-18 NOTE — Telephone Encounter (Signed)
-----   Message from Tanda Rockers, MD sent at 09/29/2020  6:30 AM EDT ----- Change f/u appt to 1st week in September 2022 with cxr 1st f/u med adenopathy

## 2020-10-18 NOTE — Telephone Encounter (Signed)
Spoke with the pt  Appt scheduled for 01/06/21  I called and left detailed msg with [facility] as pt requested

## 2020-10-18 NOTE — Telephone Encounter (Signed)
Patient given detailed instructions per Myocardial Perfusion Study Information Sheet for the test on 6// 21/22 Patient notified to arrive 15 minutes early and that it is imperative to arrive on time for appointment to keep from having the test rescheduled.  If you need to cancel or reschedule your appointment, please call the office within 24 hours of your appointment. . Patient verbalized understanding. [name]   

## 2020-10-19 ENCOUNTER — Other Ambulatory Visit: Payer: Self-pay

## 2020-10-19 ENCOUNTER — Ambulatory Visit (HOSPITAL_COMMUNITY): Payer: Medicare (Managed Care) | Attending: Cardiology

## 2020-10-19 DIAGNOSIS — Z01818 Encounter for other preprocedural examination: Secondary | ICD-10-CM

## 2020-10-19 DIAGNOSIS — Z01810 Encounter for preprocedural cardiovascular examination: Secondary | ICD-10-CM

## 2020-10-19 DIAGNOSIS — R0600 Dyspnea, unspecified: Secondary | ICD-10-CM | POA: Diagnosis present

## 2020-10-19 DIAGNOSIS — R0609 Other forms of dyspnea: Secondary | ICD-10-CM

## 2020-10-19 LAB — MYOCARDIAL PERFUSION IMAGING
LV dias vol: 310 mL (ref 62–150)
LV sys vol: 270 mL
Peak HR: 81 {beats}/min
Rest HR: 80 {beats}/min
SDS: 1
SRS: 12
SSS: 13
TID: 1.01

## 2020-10-19 MED ORDER — TECHNETIUM TC 99M TETROFOSMIN IV KIT
11.0000 | PACK | Freq: Once | INTRAVENOUS | Status: AC | PRN
  Administered 2020-10-19: 11 via INTRAVENOUS
  Filled 2020-10-19: qty 11

## 2020-10-19 MED ORDER — TECHNETIUM TC 99M TETROFOSMIN IV KIT
31.4000 | PACK | Freq: Once | INTRAVENOUS | Status: AC | PRN
Start: 2020-10-19 — End: 2020-10-19
  Administered 2020-10-19: 31.4 via INTRAVENOUS
  Filled 2020-10-19: qty 32

## 2020-10-19 MED ORDER — REGADENOSON 0.4 MG/5ML IV SOLN
0.4000 mg | Freq: Once | INTRAVENOUS | Status: AC
Start: 2020-10-19 — End: 2020-10-19
  Administered 2020-10-19: 0.4 mg via INTRAVENOUS

## 2020-10-20 ENCOUNTER — Encounter (HOSPITAL_COMMUNITY): Payer: Medicare (Managed Care)

## 2020-10-22 ENCOUNTER — Telehealth: Payer: Self-pay

## 2020-10-22 ENCOUNTER — Other Ambulatory Visit: Payer: Self-pay

## 2020-10-22 ENCOUNTER — Ambulatory Visit (HOSPITAL_COMMUNITY)
Admission: RE | Admit: 2020-10-22 | Discharge: 2020-10-22 | Disposition: A | Discharge status: Home / Self Care | Payer: Medicare (Managed Care) | Source: Ambulatory Visit | Attending: Surgery | Admitting: Surgery

## 2020-10-22 DIAGNOSIS — I739 Peripheral vascular disease, unspecified: Secondary | ICD-10-CM | POA: Insufficient documentation

## 2020-10-22 DIAGNOSIS — I7025 Atherosclerosis of native arteries of other extremities with ulceration: Secondary | ICD-10-CM | POA: Diagnosis present

## 2020-10-22 DIAGNOSIS — Z01818 Encounter for other preprocedural examination: Secondary | ICD-10-CM

## 2020-10-22 NOTE — Telephone Encounter (Signed)
Patient notified of results and MD recommendations.  He is agreeable to have Echo.  However he expresses that he will not wait to have vascular surgery. I advised him that MD is requesting that he wait.  He expresses that he does not wait.  Will route to MD to review.

## 2020-10-22 NOTE — Telephone Encounter (Signed)
-----   Message from Werner Lean, MD sent at 10/21/2020  9:39 AM EDT ----- Regarding: FW: He has evidence of asymptotic prior MI; we talked about this as a possibility. We will need an Echo to see his EF to best stratify him before his vascular surgery. ----- Message ----- From: Donato Heinz, MD Sent: 10/19/2020   7:34 PM EDT To: Werner Lean, MD

## 2020-10-25 ENCOUNTER — Ambulatory Visit (INDEPENDENT_AMBULATORY_CARE_PROVIDER_SITE_OTHER): Payer: Medicare (Managed Care) | Admitting: Surgery

## 2020-10-25 ENCOUNTER — Other Ambulatory Visit: Payer: Self-pay

## 2020-10-25 ENCOUNTER — Ambulatory Visit: Payer: Medicare (Managed Care) | Admitting: Surgery

## 2020-10-25 ENCOUNTER — Encounter: Payer: Self-pay | Admitting: Surgery

## 2020-10-25 VITALS — BP 112/67 | HR 83 | Temp 97.9°F | Resp 20 | Ht 66.0 in | Wt 133.0 lb

## 2020-10-25 DIAGNOSIS — I7025 Atherosclerosis of native arteries of other extremities with ulceration: Secondary | ICD-10-CM | POA: Diagnosis not present

## 2020-10-25 NOTE — Progress Notes (Signed)
[facility] Note  10/26/2020     CHIEF COMPLAINT Patient presents for Retina Evaluation   HISTORY OF PRESENT ILLNESS: Gary [name] is a 65 y.o. male who presents to the clinic today for:   HPI     Retina Evaluation   In both eyes.  This started 1 month ago.  Duration of 3 weeks.  Associated Symptoms Flashes and Pain.  I, the attending physician,  performed the HPI with the patient and updated documentation appropriately.        Comments   New pt referral from [facility], retinal eval for poss PDR OU. Pt states hes noticed a decline in vision for about a month. Reports some flashing of light and pain in OU. Blood sugar was 184 yesterday, most recent A1C is unknown.       Last edited by Bernarda Caffey, MD on 10/26/2020 12:20 PM.    Pt is here on the referral of [facility], but has been seen at [facility] (Dr. Prudencio Burly) in the past, pt states he does not remember seeing them, he thinks he has had cataracts removed in the past, but he is not aware of any other eye sx, pt states he has occasional pain in his eyes, pt denies having a heart attack or stroke, he is having vascular sx on his leg on Thursday, pt is diabetic and on insulin  Referring physician: Janifer Adie, MD [place],  [place] [id number]  HISTORICAL INFORMATION:   Selected notes from the MEDICAL RECORD NUMBER Referred from [facility] Prudencio Burly) and [facility] for eval of DR   CURRENT MEDICATIONS: No current outpatient medications on file. (Ophthalmic Drugs)   No current facility-administered medications for this visit. (Ophthalmic Drugs)   Current Outpatient Medications (Other)  Medication Sig   acetaminophen (TYLENOL) 500 MG tablet Take 1,000 mg by mouth 3 (three) times daily as needed for mild pain.   albuterol (VENTOLIN HFA) 108 (90 Base) MCG/ACT inhaler Inhale 2 puffs into the lungs every 6 (six) hours as needed for wheezing or shortness of breath.    aspirin 81 MG chewable tablet Chew 81 mg by mouth daily.   Benzocaine 20 % OINT Place 1 application rectally 5 (five) times daily as needed (hemmorhoids/anal fissure).   bisacodyl (DULCOLAX) 5 MG EC tablet Take 5 mg by mouth daily.   ciprofloxacin (CIPRO) 250 MG tablet Take 250 mg by mouth daily.   Dulaglutide (TRULICITY) 3 YH/0.6CB SOPN Inject 3 mg into the skin once a week.   Evolocumab with Infusor ([place]) 420 MG/3.5ML SOCT Inject 420 mg into the skin every 30 (thirty) days.   famotidine (PEPCID) 40 MG tablet Take 40 mg by mouth at bedtime.   furosemide (LASIX) 80 MG tablet Take 80-120 mg by mouth See admin instructions. Take 1.5 tablets (120 mg) by mouth in the morning & take 1 tablet (80 mg) by mouth at noon for blood pressure/fluid/potassium   metoCLOPramide (REGLAN) 5 MG tablet Take 5 mg by mouth 2 (two) times daily as needed for nausea or vomiting.   multivitamin (RENA-VIT) TABS tablet Take 1 tablet by mouth daily.   mupirocin ointment (BACTROBAN) 2 % Apply 1 application topically 2 (two) times daily. (Patient not taking: Reported on 10/26/2020)   Nitroglycerin (RECTIV) 0.4 % OINT Place 1 application rectally every 12 (twelve) hours as needed (hemmorhoids/anal fissure).   Nutritional Supplements (FEEDING SUPPLEMENT, NEPRO CARB STEADY,) LIQD Take 240 mLs by mouth daily.  polyethylene glycol (MIRALAX / GLYCOLAX) 17 g packet Take 17 g by mouth daily.   rosuvastatin (CRESTOR) 20 MG tablet Take 20 mg by mouth daily.    senna-docusate (SENOKOT-S) 8.6-50 MG tablet Take 2 tablets by mouth in the morning and at bedtime.   umeclidinium-vilanterol (ANORO ELLIPTA) 62.5-25 MCG/INH AEPB Inhale 1 puff into the lungs daily.   Vitamin D, Ergocalciferol, (DRISDOL) 1.25 MG (50000 UNIT) CAPS capsule Take 50,000 Units by mouth every 30 (thirty) days.   No current facility-administered medications for this visit. (Other)      REVIEW OF SYSTEMS: ROS   Positive for: Gastrointestinal,  Endocrine, Cardiovascular, Respiratory Last edited by Kingsley Spittle, COT on 10/26/2020  8:57 AM.       ALLERGIES Allergies  Allergen Reactions   Oxycodone-Acetaminophen Rash and Other (See Comments)    Macular papular rash after two days on Oxy/APAP resolved with d/c of drug.    PAST MEDICAL HISTORY Past Medical History:  Diagnosis Date   Blurred vision    Chronic kidney disease    sees [facility]   Constipation    Constipation    Diabetes mellitus    type 2   GERD (gastroesophageal reflux disease)    High cholesterol    Hypertension    Insomnia    Peripheral artery disease (HCC)    Port-wine stain of face    Left V1 distribution, including upper eyelid   Sensorineural hearing loss    left ear   Vitamin D deficiency    Past Surgical History:  Procedure Laterality Date   A/V FISTULAGRAM Left 01/20/2020   Procedure: A/V FISTULAGRAM;  Surgeon: Serafina Mitchell, MD;  Location: [facility];  Service: Cardiovascular;  Laterality: Left;   ABDOMINAL AORTOGRAM W/LOWER EXTREMITY N/A 10/12/2020   Procedure: ABDOMINAL AORTOGRAM W/LOWER EXTREMITY;  Surgeon: Serafina Mitchell, MD;  Location: [place] [facility];  Service: Cardiovascular;  Laterality: N/A;   AMPUTATION Right 07/17/2011   AMPUTATION Left 10/03/2012   Procedure: AMPUTATION DIGIT;  Surgeon: Serafina Mitchell, MD;  Location: [facility];  Service: Vascular;  Laterality: Left;  Amputation of left Great  toe   AV FISTULA PLACEMENT Left 08/30/2017   Procedure: CREATION OF LEFT BRACHIOCEPHALIC ARTERIOVENOUS FISTULA;  Surgeon: Serafina Mitchell, MD;  Location: [facility];  Service: Vascular;  Laterality: Left;   COLONOSCOPY     PERIPHERAL ARTERIAL STENT GRAFT N/A 2010   STUMP REVISION Right 01/28/2016   Procedure: Revision Right Below Knee Amputation;  Surgeon: Newt Minion, MD;  Location: [facility];  Service: Orthopedics;  Laterality: Right;    FAMILY HISTORY Family History  Problem Relation Age of Onset   Diabetes Mother     Hypertension Mother    Cancer Father    Colon cancer Neg Hx     SOCIAL HISTORY Social History   Tobacco Use   Smoking status: Every Day    Packs/day: 2.00    Years: 50.00    Pack years: 100.00    Types: Cigarettes   Smokeless tobacco: Never   Tobacco comments:    4 cigs per day 09/21/20//lmr  Vaping Use   Vaping Use: Never used  Substance Use Topics   Alcohol use: No    Alcohol/week: 0.0 standard drinks   Drug use: No         OPHTHALMIC EXAM:  Base Eye Exam     Visual Acuity (Snellen - Linear)       Right Left   Dist cc  20/20 20/20    Correction: Glasses         Tonometry (Tonopen, 9:06 AM)       Right Left   Pressure 12 10         Pupils       Dark Light Shape React APD   Right 3 3 Round Minimal None   Left 3 3 Round Minimal None         Visual Fields (Counting fingers)       Left Right    Full Full         Extraocular Movement       Right Left    Full, Ortho Full, Ortho         Neuro/Psych     Oriented x3: Yes   Mood/Affect: Normal         Dilation     Both eyes: 1.0% Mydriacyl, 2.5% Phenylephrine @ 9:06 AM           Slit Lamp and Fundus Exam     Slit Lamp Exam       Right Left   Lids/Lashes Dermatochalasis - upper lid Dermatochalasis - upper lid   Conjunctiva/Sclera nasal and temporal pinguecula, Melanosis nasal and temporal pinguecula, Melanosis   Cornea arcus, well healed cataract wound, tear film debris arcus, well healed cataract wound, tear film debris   Anterior Chamber Deep and quiet Deep and quiet   Iris Round and moderately dilated, No NVI Round and moderately dilated, No NVI   Lens PC IOL in good position PC IOL in good position   Vitreous Mild Vitreous syneresis Mild Vitreous syneresis         Fundus Exam       Right Left   Disc mild Pallor, Sharp rim, Compact, mild temporal PPA mild Pallor, Sharp rim, Compact, +PPP   C/D Ratio  0.0   Macula Blunted foveal reflex, mild ERM, rare MA, trace  cystic changes nasal mac, mild PRE mottling Flat, Blunted foveal reflex, trace ERM, scattered MA, mild RPE mottling   Vessels attenuated, Tortuous attenuated, Tortuous   Periphery Attached, scattered DBH   Attached, 360 DBH             Refraction     Wearing Rx       Sphere Cylinder Axis   Right -1.25 +0..25 002   Left -0.75 +0.75 144         Manifest Refraction       Sphere Cylinder Axis Dist VA   Right -1.50 +0.25 180 20/20   Left -1.00 +0.75 145 20/20            IMAGING AND PROCEDURES  Imaging and Procedures for 10/26/2020  OCT, Retina - OU - Both Eyes       Right Eye Quality was good. Central Foveal Thickness: 236. Progression has no prior data. Findings include normal foveal contour, intraretinal fluid, no SRF, preretinal fibrosis (Focal IRF/cystic changes nasal macula, focal PRF on inferior disc caught on widefield).   Left Eye Quality was good. Central Foveal Thickness: 237. Progression has no prior data. Findings include normal foveal contour, no IRF, no SRF (Partial PVD, mild diffuse atrophy).   Notes *Images captured and stored on drive  Diagnosis / Impression:  OD: Focal IRF/cystic changes nasal macula, focal PRF on inferior disc caught on widefield OS: NFP; no IRF/SRF  Clinical management:  See below  Abbreviations: NFP - Normal foveal profile. CME - cystoid macular edema. PED - pigment epithelial  detachment. IRF - intraretinal fluid. SRF - subretinal fluid. EZ - ellipsoid zone. ERM - epiretinal membrane. ORA - outer retinal atrophy. ORT - outer retinal tubulation. SRHM - subretinal hyper-reflective material. IRHM - intraretinal hyper-reflective material      Fluorescein Angiography Optos (Transit OD)       Right Eye Progression has no prior data. Early phase findings include delayed filling, microaneurysm, vascular perfusion defect, staining (Low fluorescein signal). Mid/Late phase findings include leakage, microaneurysm, vascular perfusion  defect, staining (Mild focal leakage from disc and nasal venules).   Left Eye Progression has no prior data. Early phase findings include microaneurysm, vascular perfusion defect (Low fluorescein signal). Mid/Late phase findings include microaneurysm, leakage, vascular perfusion defect (Focal perivascular leakage along IT and SN arcades).   Notes **Images stored on drive**  Impression: Low fluorescein signal Early PDR OU OD: Mild focal leakage from disc and nasal venules OS: Focal perivascular leakage along IT and SN arcades             ASSESSMENT/PLAN:    ICD-10-CM   1. 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 Retinal edema  H35.81 OCT, Retina - OU - Both Eyes    3. Essential hypertension  I10     4. Hypertensive retinopathy of both eyes  H35.033 Fluorescein Angiography Optos (Transit OD)    5. Pseudophakia, both eyes  Z96.1       1,2. Early proliferative diabetic retinopathy w/o DME, both eyes - The incidence, risk factors for progression, natural history and treatment options for diabetic retinopathy were discussed with patient.   - The need for close monitoring of blood glucose, blood pressure, and serum lipids, avoiding cigarette or any type of tobacco, and the need for long term follow up was also discussed with patient. - BCVA 20/20 OU - exam shows early preretinal fibrosis OD - FA (06.28.22) with very low fluorescein signal OU; OD: Mild focal leakage from disc and nasal venules; OS: Focal perivascular leakage along IT and SN arcades -- consider repeat with 62m Fl, but would likely benefit from some PRP for focal NV - OCT without frank diabetic macular edema, both eyes  - f/u in 3 mos -- DFE/OCT ?repeat FA w/ 548mFl  3,4. Hypertensive retinopathy OU  - pt with significant cardiovascular history - discussed importance of tight BP control - monitor  5. Pseudophakia  OU  - s/p CE/IOL OU (Dr. LyPrudencio Burly - IOL in good position, doing well  - monitor   Ophthalmic Meds Ordered this visit:  No orders of the defined types were placed in this encounter.      Return in about 3 months (around 01/26/2021) for f/u NPDR OU, DFE, OCT.  There are no Patient Instructions on file for this visit.   Explained the diagnoses, plan, and follow up with the patient and they expressed understanding.  Patient expressed understanding of the importance of proper follow up care.   This document serves as a record of services personally performed by BrGardiner SleeperMD, PhD. It was created on their behalf by AnEstill BakesCOT an ophthalmic technician. The creation of this record is the provider's dictation and/or activities during the visit.    Electronically signed by: AnEstill BakesCOT 6.27.22 @ 12:25 PM   This document serves as a record of services personally performed by BrGardiner SleeperMD, PhD. It was created on their behalf by  San Jetty. Owens Shark, OA an ophthalmic technician. The creation of this record is the provider's dictation and/or activities during the visit.    Electronically signed by: San Jetty. Marguerita Merles 06.28.2022 12:25 PM   Gardiner Sleeper, M.D., Ph.D. Diseases & Surgery of the Retina and Vitreous [facility]  I have reviewed the above documentation for accuracy and completeness, and I agree with the above. Gardiner Sleeper, M.D., Ph.D. 10/26/20 12:25 PM  Abbreviations: M myopia (nearsighted); A astigmatism; H hyperopia (farsighted); P presbyopia; Mrx spectacle prescription;  CTL contact lenses; OD right eye; OS left eye; OU both eyes  XT exotropia; ET esotropia; PEK punctate epithelial keratitis; PEE punctate epithelial erosions; DES dry eye syndrome; MGD meibomian gland dysfunction; ATs artificial tears; PFAT's preservative free artificial tears; [place] nuclear sclerotic cataract; PSC posterior subcapsular cataract; ERM epi-retinal  membrane; PVD posterior vitreous detachment; RD retinal detachment; DM diabetes mellitus; DR diabetic retinopathy; NPDR non-proliferative diabetic retinopathy; PDR proliferative diabetic retinopathy; CSME clinically significant macular edema; DME diabetic macular edema; dbh dot blot hemorrhages; CWS cotton wool spot; POAG primary open angle glaucoma; C/D cup-to-disc ratio; HVF humphrey visual field; GVF goldmann visual field; OCT optical coherence tomography; IOP intraocular pressure; BRVO Branch retinal vein occlusion; CRVO central retinal vein occlusion; CRAO central retinal artery occlusion; BRAO branch retinal artery occlusion; RT retinal tear; SB scleral buckle; PPV pars plana vitrectomy; VH Vitreous hemorrhage; PRP panretinal laser photocoagulation; IVK intravitreal kenalog; VMT vitreomacular traction; MH Macular hole;  NVD neovascularization of the disc; NVE neovascularization elsewhere; AREDS age related eye disease study; ARMD age related macular degeneration; POAG primary open angle glaucoma; EBMD epithelial/anterior basement membrane dystrophy; ACIOL anterior chamber intraocular lens; IOL intraocular lens; PCIOL posterior chamber intraocular lens; Phaco/IOL phacoemulsification with intraocular lens placement; [place] photorefractive keratectomy; LASIK laser assisted in situ keratomileusis; HTN hypertension; DM diabetes mellitus; COPD chronic obstructive pulmonary disease

## 2020-10-25 NOTE — Progress Notes (Signed)
[facility]  Patient name: Gary [name] MRN: [id number] DOB: 05-16-1955 Sex: male   REASON FOR VISIT:    Follow up  HISOTRY OF PRESENT ILLNESS:    Gary [name] is a 65 y.o. 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  He underwent left great toe amputation in 2014.  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  He now has left second toe.  He underwent angiography which revealed popliteal and peroneal occlusion.  Femoral peroneal bypass graft was recommended.  He was sent for cardiology evaluation and is back today for follow-up.  The patient suffers from diabetes.  He is medically managed for hypertension.  He takes a statin for hypercholesterolemia.  He continues to smoke.  He is on.   PAST MEDICAL HISTORY:   Past Medical History:  Diagnosis Date   Blurred vision    Chronic kidney disease    sees [facility]   Constipation    Constipation    Diabetes mellitus    type 2   GERD (gastroesophageal reflux disease)    High cholesterol    Hypertension    Insomnia    Peripheral artery disease (HCC)    Port-wine stain of face    Left V1 distribution, including upper eyelid   Sensorineural hearing loss    left ear   Vitamin D deficiency      FAMILY HISTORY:   Family History  Problem Relation Age of Onset   Diabetes Mother    Hypertension Mother    Cancer Father    Colon cancer Neg Hx     SOCIAL HISTORY:   Social History   Tobacco Use   Smoking status: Every Day    Packs/day: 2.00    Years: 50.00    Pack years: 100.00    Types: Cigarettes   Smokeless tobacco: Never   Tobacco comments:    4 cigs per day 09/21/20//lmr  Substance Use Topics   Alcohol use: No    Alcohol/week: 0.0 standard drinks     ALLERGIES:   Allergies  Allergen Reactions   Oxycodone-Acetaminophen Rash and Other (See Comments)    Macular papular rash after two  days on Oxy/APAP resolved with d/c of drug.     CURRENT MEDICATIONS:   Current Outpatient Medications  Medication Sig Dispense Refill   acetaminophen (TYLENOL) 500 MG tablet Take 1,000 mg by mouth 3 (three) times daily as needed for mild pain.     albuterol (VENTOLIN HFA) 108 (90 Base) MCG/ACT inhaler Inhale 2 puffs into the lungs every 6 (six) hours as needed for wheezing or shortness of breath.     aspirin 81 MG chewable tablet Chew 81 mg by mouth daily.     bisacodyl (DULCOLAX) 5 MG EC tablet Take 5 mg by mouth as needed.     Doxercalciferol (HECTOROL IV) Doxercalciferol (Hectorol)     Dulaglutide (TRULICITY) 3 PX/1.0GY SOPN Inject 3 mg into the skin once a week.     Evolocumab with Infusor ([place]) 420 MG/3.5ML SOCT Inject 420 mg into the skin every 30 (thirty) days.     famotidine (PEPCID) 40 MG tablet Take 40 mg by mouth as needed.     multivitamin (RENA-VIT) TABS tablet Take 1 tablet by mouth daily.     mupirocin ointment (BACTROBAN) 2 % Apply 1 application topically 2 (two) times daily. (Patient taking differently: Apply 1 application topically as needed.) 30 g  2   Nutritional Supplements (FEEDING SUPPLEMENT, NEPRO CARB STEADY,) LIQD Take 240 mLs by mouth daily.     polyethylene glycol (MIRALAX / GLYCOLAX) 17 g packet Take 17 g by mouth as needed.     rosuvastatin (CRESTOR) 20 MG tablet Take 20 mg by mouth daily.      senna-docusate (SENOKOT-S) 8.6-50 MG tablet Take 2 tablets by mouth as needed.     umeclidinium-vilanterol (ANORO ELLIPTA) 62.5-25 MCG/INH AEPB Inhale 1 puff into the lungs daily.     Vitamin D, Ergocalciferol, (DRISDOL) 1.25 MG (50000 UNIT) CAPS capsule Take 50,000 Units by mouth every 30 (thirty) days.     No current facility-administered medications for this visit.    REVIEW OF SYSTEMS:   [X]  denotes positive finding, [ ]  denotes negative finding Cardiac  Comments:  Chest pain or chest pressure:    Shortness of breath upon exertion:     Short of breath when lying flat:    Irregular heart rhythm:        Vascular    Pain in calf, thigh, or hip brought on by ambulation:    Pain in feet at night that wakes you up from your sleep:     Blood clot in your veins:    Leg swelling:         Pulmonary    Oxygen at home: x   Productive cough:     Wheezing:         Neurologic    Sudden weakness in arms or legs:     Sudden numbness in arms or legs:     Sudden onset of difficulty speaking or slurred speech:    Temporary loss of vision in one eye:     Problems with dizziness:         Gastrointestinal    Blood in stool:     Vomited blood:         Genitourinary    Burning when urinating:     Blood in urine:        Psychiatric    Major depression:         Hematologic    Bleeding problems:    Problems with blood clotting too easily:        Skin    Rashes or ulcers: x       Constitutional    Fever or chills:      PHYSICAL EXAM:   Vitals:   10/25/20 1257  BP: 112/67  Pulse: 83  Resp: 20  Temp: 97.9 F (36.6 C)  SpO2: 99%  Weight: 133 lb (60.3 kg)  Height: 5\' 6"  (1.676 m)    GENERAL: The patient is a well-nourished male, in no acute distress. The vital signs are documented above. CARDIAC: There is a regular rate and rhythm.  VASCULAR: Nonpalpable pedal pulse on the left PULMONARY: Non-labored respirations ABDOMEN: Soft and non-tender with normal pitched bowel sounds.  MUSCULOSKELETAL: There are no major deformities or cyanosis. NEUROLOGIC: No focal weakness or paresthesias are detected. SKIN: See photo below PSYCHIATRIC: The patient has a normal affect.   STUDIES:   I have reviewed his vein mapping:   Diameter (cm) RT Findings         GSV         LT Diameter (cm)LT  Findings       RUGHT                                                                   +---------------+-----------+---------------------+----------------+-------  ----+  0.47          BKA       Saphenofemoral           0.40                                                    Junction                                     +---------------+-----------+---------------------+----------------+-------  ----+       0.35                    Proximal thigh      0.29 / 0.32                 +---------------+-----------+---------------------+----------------+-------  ----+       0.27                       Mid thigh            0.21                    +---------------+-----------+---------------------+----------------+-------  ----+       0.41                     Distal thigh           0.26                    +---------------+-----------+---------------------+----------------+-------  ----+         -           BKA            Knee               0.27        branching   +---------------+-----------+---------------------+----------------+-------  ----+         -                        Prox calf            0.24                    +---------------+-----------+---------------------+----------------+-------  ----+         -                        Mid calf         0.24 / 0.21                 +---------------+-----------+---------------------+----------------+-------  ----+         -                       Distal calf           0.16                    +---------------+-----------+---------------------+----------------+-------  ----+   +-------------+-----------+---------------+----------------+-----------+  diameter (cm)RT Findings      SSV      LT Diameter (cm)LT Findings  +-------------+-----------+---------------+----------------+-----------+        -  Popliteal fossa      0.39       branching   +-------------+-----------+---------------+----------------+-----------+        -                  Proximal calf   0.43 / 0.40                +-------------+-----------+---------------+----------------+-----------+        -                     Mid calf          0.35                   +-------------+-----------+---------------+----------------+-----------+        -                   Distal calf    0.37 / 0.27    branching   +-------------+-----------+---------------+----------------+-----------+  MEDICAL ISSUES:   Left second toe ulcer in the setting of severe peripheral vascular disease: Patient recently underwent angiography that showed popliteal occlusion with peroneal reconstitution.  I was planning on surgical revascularization.  He was sent for cardiology clearance.  He was felt to be high risk.  He is also on oxygen which makes him high risk from a pulmonary standpoint.  In addition his vein is not of adequate caliber.  His toe does appear to have gotten worse.  Therefore I think revascularization needs to be done expeditiously.  Given his multiple comorbidities I would like to consider percutaneous intervention with likely pedal access from the peroneal artery.  I have asked Dr. Donzetta Matters for his assistance with this.  I will try to arrange that for this week.  Dr. Earleen Newport will be performing toe amputation once his blood flow has been optimized.    Leia Alf, MD, FACS [facility] [phone] Pager [phone]

## 2020-10-25 NOTE — H&P (View-Only) (Signed)
[facility] [place]  Patient name: Gary [name] MRN: [id number] DOB: 1956-01-06 Sex: male   REASON FOR VISIT:    Follow up  HISOTRY OF PRESENT ILLNESS:    Gary [name] is a 65 y.o. 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  He underwent left great toe amputation in 2014.  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  He now has left second toe.  He underwent angiography which revealed popliteal and peroneal occlusion.  Femoral peroneal bypass graft was recommended.  He was sent for cardiology evaluation and is back today for follow-up.  The patient suffers from diabetes.  He is medically managed for hypertension.  He takes a statin for hypercholesterolemia.  He continues to smoke.  He is on.   PAST MEDICAL HISTORY:   Past Medical History:  Diagnosis Date   Blurred vision    Chronic kidney disease    sees [facility]   Constipation    Constipation    Diabetes mellitus    type 2   GERD (gastroesophageal reflux disease)    High cholesterol    Hypertension    Insomnia    Peripheral artery disease (HCC)    Port-wine stain of face    Left V1 distribution, including upper eyelid   Sensorineural hearing loss    left ear   Vitamin D deficiency      FAMILY HISTORY:   Family History  Problem Relation Age of Onset   Diabetes Mother    Hypertension Mother    Cancer Father    Colon cancer Neg Hx     SOCIAL HISTORY:   Social History   Tobacco Use   Smoking status: Every Day    Packs/day: 2.00    Years: 50.00    Pack years: 100.00    Types: Cigarettes   Smokeless tobacco: Never   Tobacco comments:    4 cigs per day 09/21/20//lmr  Substance Use Topics   Alcohol use: No    Alcohol/week: 0.0 standard drinks     ALLERGIES:   Allergies  Allergen Reactions   Oxycodone-Acetaminophen Rash and Other (See Comments)    Macular papular rash after two  days on Oxy/APAP resolved with d/c of drug.     CURRENT MEDICATIONS:   Current Outpatient Medications  Medication Sig Dispense Refill   acetaminophen (TYLENOL) 500 MG tablet Take 1,000 mg by mouth 3 (three) times daily as needed for mild pain.     albuterol (VENTOLIN HFA) 108 (90 Base) MCG/ACT inhaler Inhale 2 puffs into the lungs every 6 (six) hours as needed for wheezing or shortness of breath.     aspirin 81 MG chewable tablet Chew 81 mg by mouth daily.     bisacodyl (DULCOLAX) 5 MG EC tablet Take 5 mg by mouth as needed.     Doxercalciferol (HECTOROL IV) Doxercalciferol (Hectorol)     Dulaglutide (TRULICITY) 3 OI/7.1IW SOPN Inject 3 mg into the skin once a week.     Evolocumab with Infusor (Center Line) 420 MG/3.5ML SOCT Inject 420 mg into the skin every 30 (thirty) days.     famotidine (PEPCID) 40 MG tablet Take 40 mg by mouth as needed.     multivitamin (RENA-VIT) TABS tablet Take 1 tablet by mouth daily.     mupirocin ointment (BACTROBAN) 2 % Apply 1 application topically 2 (two) times daily. (Patient taking differently: Apply 1 application topically as needed.) 30 g  2   Nutritional Supplements (FEEDING SUPPLEMENT, NEPRO CARB STEADY,) LIQD Take 240 mLs by mouth daily.     polyethylene glycol (MIRALAX / GLYCOLAX) 17 g packet Take 17 g by mouth as needed.     rosuvastatin (CRESTOR) 20 MG tablet Take 20 mg by mouth daily.      senna-docusate (SENOKOT-S) 8.6-50 MG tablet Take 2 tablets by mouth as needed.     umeclidinium-vilanterol (ANORO ELLIPTA) 62.5-25 MCG/INH AEPB Inhale 1 puff into the lungs daily.     Vitamin D, Ergocalciferol, (DRISDOL) 1.25 MG (50000 UNIT) CAPS capsule Take 50,000 Units by mouth every 30 (thirty) days.     No current facility-administered medications for this visit.    REVIEW OF SYSTEMS:   [X]  denotes positive finding, [ ]  denotes negative finding Cardiac  Comments:  Chest pain or chest pressure:    Shortness of breath upon exertion:     Short of breath when lying flat:    Irregular heart rhythm:        Vascular    Pain in calf, thigh, or hip brought on by ambulation:    Pain in feet at night that wakes you up from your sleep:     Blood clot in your veins:    Leg swelling:         Pulmonary    Oxygen at home: x   Productive cough:     Wheezing:         Neurologic    Sudden weakness in arms or legs:     Sudden numbness in arms or legs:     Sudden onset of difficulty speaking or slurred speech:    Temporary loss of vision in one eye:     Problems with dizziness:         Gastrointestinal    Blood in stool:     Vomited blood:         Genitourinary    Burning when urinating:     Blood in urine:        Psychiatric    Major depression:         Hematologic    Bleeding problems:    Problems with blood clotting too easily:        Skin    Rashes or ulcers: x       Constitutional    Fever or chills:      PHYSICAL EXAM:   Vitals:   10/25/20 1257  BP: 112/67  Pulse: 83  Resp: 20  Temp: 97.9 F (36.6 C)  SpO2: 99%  Weight: 133 lb (60.3 kg)  Height: 5\' 6"  (1.676 m)    GENERAL: The patient is a well-nourished male, in no acute distress. The vital signs are documented above. CARDIAC: There is a regular rate and rhythm.  VASCULAR: Nonpalpable pedal pulse on the left PULMONARY: Non-labored respirations ABDOMEN: Soft and non-tender with normal pitched bowel sounds.  MUSCULOSKELETAL: There are no major deformities or cyanosis. NEUROLOGIC: No focal weakness or paresthesias are detected. SKIN: See photo below PSYCHIATRIC: The patient has a normal affect.   STUDIES:   I have reviewed his vein mapping:   Diameter (cm) RT Findings         GSV         LT Diameter (cm)LT  Findings       RUGHT                                                                   +---------------+-----------+---------------------+----------------+-------  ----+  0.47          BKA       Saphenofemoral           0.40                                                    Junction                                     +---------------+-----------+---------------------+----------------+-------  ----+       0.35                    Proximal thigh      0.29 / 0.32                 +---------------+-----------+---------------------+----------------+-------  ----+       0.27                       Mid thigh            0.21                    +---------------+-----------+---------------------+----------------+-------  ----+       0.41                     Distal thigh           0.26                    +---------------+-----------+---------------------+----------------+-------  ----+         -           BKA            Knee               0.27        branching   +---------------+-----------+---------------------+----------------+-------  ----+         -                        Prox calf            0.24                    +---------------+-----------+---------------------+----------------+-------  ----+         -                        Mid calf         0.24 / 0.21                 +---------------+-----------+---------------------+----------------+-------  ----+         -                       Distal calf           0.16                    +---------------+-----------+---------------------+----------------+-------  ----+   +-------------+-----------+---------------+----------------+-----------+  diameter (cm)RT Findings      SSV      LT Diameter (cm)LT Findings  +-------------+-----------+---------------+----------------+-----------+        -  Popliteal fossa      0.39       branching   +-------------+-----------+---------------+----------------+-----------+        -                  Proximal calf   0.43 / 0.40                +-------------+-----------+---------------+----------------+-----------+        -                     Mid calf          0.35                   +-------------+-----------+---------------+----------------+-----------+        -                   Distal calf    0.37 / 0.27    branching   +-------------+-----------+---------------+----------------+-----------+  MEDICAL ISSUES:   Left second toe ulcer in the setting of severe peripheral vascular disease: Patient recently underwent angiography that showed popliteal occlusion with peroneal reconstitution.  I was planning on surgical revascularization.  He was sent for cardiology clearance.  He was felt to be high risk.  He is also on oxygen which makes him high risk from a pulmonary standpoint.  In addition his vein is not of adequate caliber.  His toe does appear to have gotten worse.  Therefore I think revascularization needs to be done expeditiously.  Given his multiple comorbidities I would like to consider percutaneous intervention with likely pedal access from the peroneal artery.  I have asked Dr. Donzetta Matters for his assistance with this.  I will try to arrange that for this week.  Dr. Earleen Newport will be performing toe amputation once his blood flow has been optimized.    Leia Alf, MD, FACS [facility] [phone] Pager [phone]

## 2020-10-26 ENCOUNTER — Encounter (INDEPENDENT_AMBULATORY_CARE_PROVIDER_SITE_OTHER): Payer: Self-pay | Admitting: Ophthalmology

## 2020-10-26 ENCOUNTER — Ambulatory Visit (INDEPENDENT_AMBULATORY_CARE_PROVIDER_SITE_OTHER): Payer: Medicare (Managed Care) | Admitting: Ophthalmology

## 2020-10-26 DIAGNOSIS — I10 Essential (primary) hypertension: Secondary | ICD-10-CM | POA: Diagnosis not present

## 2020-10-26 DIAGNOSIS — E113593 Type 2 diabetes mellitus with proliferative diabetic retinopathy without macular edema, bilateral: Secondary | ICD-10-CM

## 2020-10-26 DIAGNOSIS — H3581 Retinal edema: Secondary | ICD-10-CM | POA: Diagnosis not present

## 2020-10-26 DIAGNOSIS — Z961 Presence of intraocular lens: Secondary | ICD-10-CM

## 2020-10-26 DIAGNOSIS — H35033 Hypertensive retinopathy, bilateral: Secondary | ICD-10-CM | POA: Diagnosis not present

## 2020-10-28 ENCOUNTER — Encounter (HOSPITAL_COMMUNITY)
Admission: AD | Disposition: A | Discharge status: Home / Self Care | Payer: Self-pay | Source: Home / Self Care | Attending: Internal Medicine

## 2020-10-28 ENCOUNTER — Inpatient Hospital Stay (HOSPITAL_COMMUNITY)
Admission: AD | Admit: 2020-10-28 | Discharge: 2020-10-30 | DRG: 629 | Disposition: A | Discharge status: Home / Self Care | Payer: Medicare (Managed Care) | Attending: Internal Medicine | Admitting: Internal Medicine

## 2020-10-28 ENCOUNTER — Ambulatory Visit (HOSPITAL_COMMUNITY): Payer: Medicare (Managed Care)

## 2020-10-28 ENCOUNTER — Encounter (HOSPITAL_COMMUNITY): Payer: Self-pay | Admitting: Vascular Surgery

## 2020-10-28 ENCOUNTER — Other Ambulatory Visit: Payer: Self-pay

## 2020-10-28 ENCOUNTER — Ambulatory Visit (HOSPITAL_COMMUNITY): Payer: Medicare (Managed Care) | Admitting: Certified Registered Nurse Anesthetist

## 2020-10-28 ENCOUNTER — Ambulatory Visit: Payer: Medicare (Managed Care) | Admitting: Podiatry

## 2020-10-28 DIAGNOSIS — E559 Vitamin D deficiency, unspecified: Secondary | ICD-10-CM | POA: Diagnosis present

## 2020-10-28 DIAGNOSIS — N186 End stage renal disease: Secondary | ICD-10-CM

## 2020-10-28 DIAGNOSIS — Z20822 Contact with and (suspected) exposure to covid-19: Secondary | ICD-10-CM | POA: Diagnosis present

## 2020-10-28 DIAGNOSIS — F17200 Nicotine dependence, unspecified, uncomplicated: Secondary | ICD-10-CM | POA: Diagnosis present

## 2020-10-28 DIAGNOSIS — Z809 Family history of malignant neoplasm, unspecified: Secondary | ICD-10-CM | POA: Diagnosis not present

## 2020-10-28 DIAGNOSIS — N2581 Secondary hyperparathyroidism of renal origin: Secondary | ICD-10-CM | POA: Diagnosis present

## 2020-10-28 DIAGNOSIS — Z833 Family history of diabetes mellitus: Secondary | ICD-10-CM | POA: Diagnosis not present

## 2020-10-28 DIAGNOSIS — I959 Hypotension, unspecified: Secondary | ICD-10-CM | POA: Diagnosis not present

## 2020-10-28 DIAGNOSIS — Z89511 Acquired absence of right leg below knee: Secondary | ICD-10-CM

## 2020-10-28 DIAGNOSIS — I2510 Atherosclerotic heart disease of native coronary artery without angina pectoris: Secondary | ICD-10-CM | POA: Diagnosis present

## 2020-10-28 DIAGNOSIS — I998 Other disorder of circulatory system: Secondary | ICD-10-CM | POA: Diagnosis not present

## 2020-10-28 DIAGNOSIS — I739 Peripheral vascular disease, unspecified: Secondary | ICD-10-CM | POA: Diagnosis present

## 2020-10-28 DIAGNOSIS — E7800 Pure hypercholesterolemia, unspecified: Secondary | ICD-10-CM | POA: Diagnosis present

## 2020-10-28 DIAGNOSIS — E11621 Type 2 diabetes mellitus with foot ulcer: Secondary | ICD-10-CM | POA: Diagnosis present

## 2020-10-28 DIAGNOSIS — E1151 Type 2 diabetes mellitus with diabetic peripheral angiopathy without gangrene: Secondary | ICD-10-CM | POA: Diagnosis present

## 2020-10-28 DIAGNOSIS — E1122 Type 2 diabetes mellitus with diabetic chronic kidney disease: Secondary | ICD-10-CM | POA: Diagnosis present

## 2020-10-28 DIAGNOSIS — Z992 Dependence on renal dialysis: Secondary | ICD-10-CM

## 2020-10-28 DIAGNOSIS — Z8249 Family history of ischemic heart disease and other diseases of the circulatory system: Secondary | ICD-10-CM

## 2020-10-28 DIAGNOSIS — H905 Unspecified sensorineural hearing loss: Secondary | ICD-10-CM | POA: Diagnosis present

## 2020-10-28 DIAGNOSIS — I10 Essential (primary) hypertension: Secondary | ICD-10-CM | POA: Diagnosis not present

## 2020-10-28 DIAGNOSIS — I120 Hypertensive chronic kidney disease with stage 5 chronic kidney disease or end stage renal disease: Secondary | ICD-10-CM

## 2020-10-28 DIAGNOSIS — Z79899 Other long term (current) drug therapy: Secondary | ICD-10-CM

## 2020-10-28 DIAGNOSIS — L97529 Non-pressure chronic ulcer of other part of left foot with unspecified severity: Secondary | ICD-10-CM | POA: Diagnosis present

## 2020-10-28 DIAGNOSIS — J449 Chronic obstructive pulmonary disease, unspecified: Secondary | ICD-10-CM | POA: Diagnosis present

## 2020-10-28 DIAGNOSIS — I132 Hypertensive heart and chronic kidney disease with heart failure and with stage 5 chronic kidney disease, or end stage renal disease: Secondary | ICD-10-CM | POA: Diagnosis present

## 2020-10-28 DIAGNOSIS — Z885 Allergy status to narcotic agent status: Secondary | ICD-10-CM

## 2020-10-28 DIAGNOSIS — F17210 Nicotine dependence, cigarettes, uncomplicated: Secondary | ICD-10-CM | POA: Diagnosis present

## 2020-10-28 DIAGNOSIS — D631 Anemia in chronic kidney disease: Secondary | ICD-10-CM | POA: Diagnosis present

## 2020-10-28 DIAGNOSIS — J9611 Chronic respiratory failure with hypoxia: Secondary | ICD-10-CM | POA: Diagnosis present

## 2020-10-28 DIAGNOSIS — I5022 Chronic systolic (congestive) heart failure: Secondary | ICD-10-CM | POA: Diagnosis present

## 2020-10-28 DIAGNOSIS — R0602 Shortness of breath: Secondary | ICD-10-CM

## 2020-10-28 DIAGNOSIS — I70222 Atherosclerosis of native arteries of extremities with rest pain, left leg: Secondary | ICD-10-CM | POA: Diagnosis not present

## 2020-10-28 DIAGNOSIS — Z7982 Long term (current) use of aspirin: Secondary | ICD-10-CM

## 2020-10-28 DIAGNOSIS — K219 Gastro-esophageal reflux disease without esophagitis: Secondary | ICD-10-CM | POA: Diagnosis present

## 2020-10-28 DIAGNOSIS — I70245 Atherosclerosis of native arteries of left leg with ulceration of other part of foot: Secondary | ICD-10-CM

## 2020-10-28 DIAGNOSIS — I7777 Dissection of artery of lower extremity: Secondary | ICD-10-CM

## 2020-10-28 HISTORY — PX: PATCH ANGIOPLASTY: SHX6230

## 2020-10-28 HISTORY — PX: LOWER EXTREMITY ANGIOGRAPHY: CATH118251

## 2020-10-28 HISTORY — PX: PERIPHERAL VASCULAR INTERVENTION: CATH118257

## 2020-10-28 HISTORY — PX: PERIPHERAL VASCULAR ATHERECTOMY: CATH118256

## 2020-10-28 HISTORY — PX: PERIPHERAL VASCULAR BALLOON ANGIOPLASTY: CATH118281

## 2020-10-28 HISTORY — PX: ENDARTERECTOMY FEMORAL: SHX5804

## 2020-10-28 HISTORY — PX: LOWER EXTREMITY ANGIOGRAM: SHX5508

## 2020-10-28 LAB — IRON AND TIBC
Iron: 65 ug/dL (ref 45–182)
Saturation Ratios: 20 % (ref 17.9–39.5)
TIBC: 330 ug/dL (ref 250–450)
UIBC: 265 ug/dL

## 2020-10-28 LAB — POCT I-STAT, CHEM 8
BUN: 65 mg/dL — ABNORMAL HIGH (ref 8–23)
Calcium, Ion: 0.93 mmol/L — ABNORMAL LOW (ref 1.15–1.40)
Chloride: 94 mmol/L — ABNORMAL LOW (ref 98–111)
Creatinine, Ser: 7.3 mg/dL — ABNORMAL HIGH (ref 0.61–1.24)
Glucose, Bld: 173 mg/dL — ABNORMAL HIGH (ref 70–99)
HCT: 34 % — ABNORMAL LOW (ref 39.0–52.0)
Hemoglobin: 11.6 g/dL — ABNORMAL LOW (ref 13.0–17.0)
Potassium: 5.5 mmol/L — ABNORMAL HIGH (ref 3.5–5.1)
Sodium: 134 mmol/L — ABNORMAL LOW (ref 135–145)
TCO2: 32 mmol/L (ref 22–32)

## 2020-10-28 LAB — POCT I-STAT 7, (LYTES, BLD GAS, ICA,H+H)
Acid-Base Excess: 9 mmol/L — ABNORMAL HIGH (ref 0.0–2.0)
Bicarbonate: 32.3 mmol/L — ABNORMAL HIGH (ref 20.0–28.0)
Calcium, Ion: 1.05 mmol/L — ABNORMAL LOW (ref 1.15–1.40)
HCT: 33 % — ABNORMAL LOW (ref 39.0–52.0)
Hemoglobin: 11.2 g/dL — ABNORMAL LOW (ref 13.0–17.0)
O2 Saturation: 100 %
Patient temperature: 36
Potassium: 3.6 mmol/L (ref 3.5–5.1)
Sodium: 136 mmol/L (ref 135–145)
TCO2: 33 mmol/L — ABNORMAL HIGH (ref 22–32)
pCO2 arterial: 36.4 mmHg (ref 32.0–48.0)
pH, Arterial: 7.552 — ABNORMAL HIGH (ref 7.350–7.450)
pO2, Arterial: 338 mmHg — ABNORMAL HIGH (ref 83.0–108.0)

## 2020-10-28 LAB — POCT ACTIVATED CLOTTING TIME: Activated Clotting Time: 283 seconds

## 2020-10-28 LAB — GLUCOSE, CAPILLARY
Glucose-Capillary: 139 mg/dL — ABNORMAL HIGH (ref 70–99)
Glucose-Capillary: 156 mg/dL — ABNORMAL HIGH (ref 70–99)

## 2020-10-28 LAB — TYPE AND SCREEN
ABO/RH(D): A POS
Antibody Screen: NEGATIVE

## 2020-10-28 SURGERY — ANGIOPLASTY, USING PATCH GRAFT
Anesthesia: General | Site: Groin | Laterality: Left

## 2020-10-28 SURGERY — PERIPHERAL VASCULAR ATHERECTOMY
Anesthesia: LOCAL

## 2020-10-28 MED ORDER — ALBUTEROL SULFATE (2.5 MG/3ML) 0.083% IN NEBU: 2.5000 mg | INHALATION_SOLUTION | Freq: Four times a day (QID) | RESPIRATORY_TRACT | Status: DC | PRN

## 2020-10-28 MED ORDER — SODIUM CHLORIDE 0.9 % IV SOLN: 250.0000 mL | INTRAVENOUS | Status: DC | PRN

## 2020-10-28 MED ORDER — FENTANYL CITRATE (PF) 100 MCG/2ML IJ SOLN
INTRAMUSCULAR | Status: AC
  Filled 2020-10-28: qty 2

## 2020-10-28 MED ORDER — LIDOCAINE HCL (PF) 1 % IJ SOLN
INTRAMUSCULAR | Status: DC | PRN
  Administered 2020-10-28: 30 mL via INTRADERMAL

## 2020-10-28 MED ORDER — PHENYLEPHRINE HCL-NACL 10-0.9 MG/250ML-% IV SOLN
INTRAVENOUS | Status: DC | PRN
  Administered 2020-10-28: 40 ug/min via INTRAVENOUS

## 2020-10-28 MED ORDER — CHLORHEXIDINE GLUCONATE CLOTH 2 % EX PADS
6.0000 | MEDICATED_PAD | Freq: Every day | CUTANEOUS | Status: DC
  Administered 2020-10-29 – 2020-10-30 (×2): 6 via TOPICAL

## 2020-10-28 MED ORDER — HEPARIN (PORCINE) 25000 UT/250ML-% IV SOLN
INTRAVENOUS | Status: AC | PRN
  Administered 2020-10-28: 500 [IU]/h via INTRAVENOUS

## 2020-10-28 MED ORDER — ACETAMINOPHEN 160 MG/5ML PO SOLN: 325.0000 mg | ORAL | Status: DC | PRN

## 2020-10-28 MED ORDER — CLOPIDOGREL BISULFATE 75 MG PO TABS: 75.0000 mg | ORAL_TABLET | Freq: Every day | ORAL | Status: DC

## 2020-10-28 MED ORDER — HEPARIN SODIUM (PORCINE) 1000 UNIT/ML IJ SOLN
INTRAMUSCULAR | Status: AC
  Filled 2020-10-28: qty 1

## 2020-10-28 MED ORDER — SODIUM CHLORIDE 0.9% FLUSH: 3.0000 mL | Freq: Two times a day (BID) | INTRAVENOUS | Status: DC

## 2020-10-28 MED ORDER — ACETAMINOPHEN 325 MG PO TABS: 325.0000 mg | ORAL_TABLET | ORAL | Status: DC | PRN

## 2020-10-28 MED ORDER — HEPARIN SODIUM (PORCINE) 1000 UNIT/ML IJ SOLN
INTRAMUSCULAR | Status: DC | PRN
  Administered 2020-10-28: 5000 [IU] via INTRAVENOUS
  Administered 2020-10-28 (×2): 3000 [IU] via INTRAVENOUS

## 2020-10-28 MED ORDER — ASPIRIN 81 MG PO CHEW
81.0000 mg | CHEWABLE_TABLET | Freq: Every day | ORAL | Status: DC
  Administered 2020-10-28 – 2020-10-30 (×3): 81 mg via ORAL
  Filled 2020-10-28 (×3): qty 1

## 2020-10-28 MED ORDER — PROMETHAZINE HCL 25 MG/ML IJ SOLN: 6.2500 mg | INTRAMUSCULAR | Status: DC | PRN

## 2020-10-28 MED ORDER — BISACODYL 5 MG PO TBEC
5.0000 mg | DELAYED_RELEASE_TABLET | Freq: Every day | ORAL | Status: DC
  Administered 2020-10-28 – 2020-10-29 (×2): 5 mg via ORAL
  Filled 2020-10-28 (×2): qty 1

## 2020-10-28 MED ORDER — POLYETHYLENE GLYCOL 3350 17 G PO PACK
17.0000 g | PACK | Freq: Every day | ORAL | Status: DC
  Administered 2020-10-28: 17 g via ORAL
  Filled 2020-10-28: qty 1

## 2020-10-28 MED ORDER — UMECLIDINIUM-VILANTEROL 62.5-25 MCG/INH IN AEPB
1.0000 | INHALATION_SPRAY | Freq: Every day | RESPIRATORY_TRACT | Status: DC
  Filled 2020-10-28: qty 14

## 2020-10-28 MED ORDER — SODIUM CHLORIDE 0.9% IV SOLUTION: Freq: Once | INTRAVENOUS | Status: DC

## 2020-10-28 MED ORDER — HEPARIN (PORCINE) IN NACL 2000-0.9 UNIT/L-% IV SOLN: INTRAVENOUS | Status: DC | PRN

## 2020-10-28 MED ORDER — HYDROMORPHONE HCL 1 MG/ML IJ SOLN
0.5000 mg | INTRAMUSCULAR | Status: DC | PRN
  Administered 2020-10-30: 0.5 mg via INTRAVENOUS
  Filled 2020-10-28: qty 1

## 2020-10-28 MED ORDER — LIDOCAINE HCL (PF) 1 % IJ SOLN
INTRAMUSCULAR | Status: AC
  Filled 2020-10-28: qty 30

## 2020-10-28 MED ORDER — HEPARIN 6000 UNIT IRRIGATION SOLUTION
Status: DC | PRN
  Administered 2020-10-28: 1

## 2020-10-28 MED ORDER — HEPARIN SODIUM (PORCINE) 5000 UNIT/ML IJ SOLN
5000.0000 [IU] | Freq: Two times a day (BID) | INTRAMUSCULAR | Status: DC
  Administered 2020-10-28 – 2020-10-30 (×4): 5000 [IU] via SUBCUTANEOUS
  Filled 2020-10-28 (×4): qty 1

## 2020-10-28 MED ORDER — ACETAMINOPHEN 10 MG/ML IV SOLN: 1000.0000 mg | Freq: Once | INTRAVENOUS | Status: DC | PRN

## 2020-10-28 MED ORDER — LIDOCAINE 2% (20 MG/ML) 5 ML SYRINGE
INTRAMUSCULAR | Status: DC | PRN
  Administered 2020-10-28: 40 mg via INTRAVENOUS

## 2020-10-28 MED ORDER — CEFAZOLIN SODIUM-DEXTROSE 2-3 GM-%(50ML) IV SOLR
INTRAVENOUS | Status: DC | PRN
  Administered 2020-10-28: 2 g via INTRAVENOUS

## 2020-10-28 MED ORDER — FAMOTIDINE 20 MG PO TABS
40.0000 mg | ORAL_TABLET | Freq: Every day | ORAL | Status: DC
  Administered 2020-10-28: 40 mg via ORAL
  Filled 2020-10-28: qty 2

## 2020-10-28 MED ORDER — FUROSEMIDE 80 MG PO TABS
80.0000 mg | ORAL_TABLET | Freq: Every day | ORAL | Status: DC
  Administered 2020-10-30: 80 mg via ORAL
  Filled 2020-10-28 (×3): qty 1

## 2020-10-28 MED ORDER — IODIXANOL 320 MG/ML IV SOLN
INTRAVENOUS | Status: DC | PRN
  Administered 2020-10-28: 36 mL via INTRA_ARTERIAL

## 2020-10-28 MED ORDER — PHENYLEPHRINE 40 MCG/ML (10ML) SYRINGE FOR IV PUSH (FOR BLOOD PRESSURE SUPPORT)
PREFILLED_SYRINGE | INTRAVENOUS | Status: DC | PRN
  Administered 2020-10-28: 80 ug via INTRAVENOUS

## 2020-10-28 MED ORDER — ONDANSETRON HCL 4 MG/2ML IJ SOLN
INTRAMUSCULAR | Status: AC
  Filled 2020-10-28: qty 2

## 2020-10-28 MED ORDER — SUGAMMADEX SODIUM 200 MG/2ML IV SOLN
INTRAVENOUS | Status: DC | PRN
  Administered 2020-10-28 (×2): 200 mg via INTRAVENOUS

## 2020-10-28 MED ORDER — ROCURONIUM BROMIDE 10 MG/ML (PF) SYRINGE
PREFILLED_SYRINGE | INTRAVENOUS | Status: DC | PRN
  Administered 2020-10-28: 20 mg via INTRAVENOUS
  Administered 2020-10-28: 50 mg via INTRAVENOUS
  Administered 2020-10-28: 10 mg via INTRAVENOUS

## 2020-10-28 MED ORDER — ETOMIDATE 2 MG/ML IV SOLN
INTRAVENOUS | Status: AC
  Filled 2020-10-28: qty 10

## 2020-10-28 MED ORDER — INSULIN ASPART 100 UNIT/ML IJ SOLN
0.0000 [IU] | Freq: Three times a day (TID) | INTRAMUSCULAR | Status: DC
  Administered 2020-10-29: 1 [IU] via SUBCUTANEOUS
  Administered 2020-10-29: 3 [IU] via SUBCUTANEOUS
  Administered 2020-10-29: 1 [IU] via SUBCUTANEOUS
  Administered 2020-10-30 (×2): 2 [IU] via SUBCUTANEOUS

## 2020-10-28 MED ORDER — SENNOSIDES-DOCUSATE SODIUM 8.6-50 MG PO TABS
2.0000 | ORAL_TABLET | Freq: Two times a day (BID) | ORAL | Status: DC
  Administered 2020-10-28 – 2020-10-29 (×3): 2 via ORAL
  Filled 2020-10-28 (×4): qty 2

## 2020-10-28 MED ORDER — LIDOCAINE 2% (20 MG/ML) 5 ML SYRINGE
INTRAMUSCULAR | Status: AC
  Filled 2020-10-28: qty 5

## 2020-10-28 MED ORDER — HEPARIN SODIUM (PORCINE) 1000 UNIT/ML IJ SOLN
INTRAMUSCULAR | Status: DC | PRN
  Administered 2020-10-28 (×3): 5000 [IU] via INTRAVENOUS

## 2020-10-28 MED ORDER — OXYCODONE HCL 5 MG PO TABS: 5.0000 mg | ORAL_TABLET | Freq: Once | ORAL | Status: DC | PRN

## 2020-10-28 MED ORDER — HYDROCODONE-ACETAMINOPHEN 5-325 MG PO TABS
1.0000 | ORAL_TABLET | Freq: Four times a day (QID) | ORAL | Status: DC | PRN
  Administered 2020-10-28: 2 via ORAL
  Administered 2020-10-29: 1 via ORAL
  Administered 2020-10-30 (×2): 2 via ORAL
  Filled 2020-10-28: qty 2
  Filled 2020-10-28: qty 1
  Filled 2020-10-28 (×2): qty 2

## 2020-10-28 MED ORDER — FENTANYL CITRATE (PF) 100 MCG/2ML IJ SOLN
INTRAMUSCULAR | Status: DC | PRN
  Administered 2020-10-28 (×2): 50 ug via INTRAVENOUS

## 2020-10-28 MED ORDER — CLOPIDOGREL BISULFATE 75 MG PO TABS
75.0000 mg | ORAL_TABLET | Freq: Every day | ORAL | Status: DC
  Administered 2020-10-29 – 2020-10-30 (×2): 75 mg via ORAL
  Filled 2020-10-28 (×2): qty 1

## 2020-10-28 MED ORDER — FENTANYL CITRATE (PF) 100 MCG/2ML IJ SOLN
25.0000 ug | INTRAMUSCULAR | Status: DC | PRN
  Administered 2020-10-28 (×2): 25 ug via INTRAVENOUS

## 2020-10-28 MED ORDER — HEPARIN (PORCINE) IN NACL 1000-0.9 UT/500ML-% IV SOLN
INTRAVENOUS | Status: AC
  Filled 2020-10-28: qty 1000

## 2020-10-28 MED ORDER — 0.9 % SODIUM CHLORIDE (POUR BTL) OPTIME
TOPICAL | Status: DC | PRN
  Administered 2020-10-28: 2000 mL

## 2020-10-28 MED ORDER — MIDAZOLAM HCL 2 MG/2ML IJ SOLN
INTRAMUSCULAR | Status: DC | PRN
  Administered 2020-10-28 (×2): 1 mg via INTRAVENOUS

## 2020-10-28 MED ORDER — ONDANSETRON HCL 4 MG/2ML IJ SOLN
INTRAMUSCULAR | Status: DC | PRN
  Administered 2020-10-28: 4 mg via INTRAVENOUS

## 2020-10-28 MED ORDER — CIPROFLOXACIN HCL 500 MG PO TABS
250.0000 mg | ORAL_TABLET | Freq: Every day | ORAL | Status: DC
  Administered 2020-10-28 – 2020-10-30 (×3): 250 mg via ORAL
  Filled 2020-10-28 (×3): qty 1

## 2020-10-28 MED ORDER — MIDAZOLAM HCL 2 MG/2ML IJ SOLN
INTRAMUSCULAR | Status: AC
  Filled 2020-10-28: qty 2

## 2020-10-28 MED ORDER — CEFAZOLIN SODIUM 1 G IJ SOLR
INTRAMUSCULAR | Status: AC
  Filled 2020-10-28: qty 20

## 2020-10-28 MED ORDER — HEMOSTATIC AGENTS (NO CHARGE) OPTIME
TOPICAL | Status: DC | PRN
  Administered 2020-10-28: 1 via TOPICAL

## 2020-10-28 MED ORDER — HEPARIN (PORCINE) 25000 UT/250ML-% IV SOLN
INTRAVENOUS | Status: DC | PRN
  Administered 2020-10-28: 500 [IU]/h via INTRAVENOUS

## 2020-10-28 MED ORDER — RENA-VITE PO TABS
1.0000 | ORAL_TABLET | Freq: Every day | ORAL | Status: DC
  Administered 2020-10-28 – 2020-10-30 (×3): 1 via ORAL
  Filled 2020-10-28 (×3): qty 1

## 2020-10-28 MED ORDER — ETOMIDATE 2 MG/ML IV SOLN
INTRAVENOUS | Status: DC | PRN
  Administered 2020-10-28: 8 mg via INTRAVENOUS

## 2020-10-28 MED ORDER — FUROSEMIDE 80 MG PO TABS
120.0000 mg | ORAL_TABLET | Freq: Every day | ORAL | Status: DC
  Administered 2020-10-29 – 2020-10-30 (×2): 120 mg via ORAL
  Filled 2020-10-28: qty 2

## 2020-10-28 MED ORDER — FENTANYL CITRATE (PF) 250 MCG/5ML IJ SOLN
INTRAMUSCULAR | Status: DC | PRN
  Administered 2020-10-28 (×2): 50 ug via INTRAVENOUS

## 2020-10-28 MED ORDER — PHENYLEPHRINE 40 MCG/ML (10ML) SYRINGE FOR IV PUSH (FOR BLOOD PRESSURE SUPPORT)
PREFILLED_SYRINGE | INTRAVENOUS | Status: AC
  Filled 2020-10-28: qty 10

## 2020-10-28 MED ORDER — HEPARIN 6000 UNIT IRRIGATION SOLUTION
Status: AC
  Filled 2020-10-28: qty 500

## 2020-10-28 MED ORDER — NEPRO/CARBSTEADY PO LIQD
240.0000 mL | Freq: Every day | ORAL | Status: DC
  Administered 2020-10-28 – 2020-10-29 (×2): 237 mL via ORAL
  Administered 2020-10-30: 240 mL via ORAL

## 2020-10-28 MED ORDER — IODIXANOL 320 MG/ML IV SOLN
INTRAVENOUS | Status: DC | PRN
  Administered 2020-10-28: 110 mL via INTRA_ARTERIAL

## 2020-10-28 MED ORDER — IOHEXOL 300 MG/ML  SOLN
INTRAMUSCULAR | Status: DC | PRN
  Administered 2020-10-28: 8 mL via INTRA_ARTERIAL

## 2020-10-28 MED ORDER — SODIUM CHLORIDE 0.9% FLUSH: 3.0000 mL | INTRAVENOUS | Status: DC | PRN

## 2020-10-28 MED ORDER — METOCLOPRAMIDE HCL 5 MG PO TABS
5.0000 mg | ORAL_TABLET | Freq: Two times a day (BID) | ORAL | Status: DC | PRN
  Administered 2020-10-29: 5 mg via ORAL
  Filled 2020-10-28: qty 1

## 2020-10-28 MED ORDER — ROCURONIUM BROMIDE 10 MG/ML (PF) SYRINGE
PREFILLED_SYRINGE | INTRAVENOUS | Status: AC
  Filled 2020-10-28: qty 10

## 2020-10-28 MED ORDER — FENTANYL CITRATE (PF) 250 MCG/5ML IJ SOLN
INTRAMUSCULAR | Status: AC
  Filled 2020-10-28: qty 5

## 2020-10-28 MED ORDER — OXYCODONE HCL 5 MG/5ML PO SOLN: 5.0000 mg | Freq: Once | ORAL | Status: DC | PRN

## 2020-10-28 MED ORDER — HEPARIN (PORCINE) IN NACL 1000-0.9 UT/500ML-% IV SOLN
INTRAVENOUS | Status: DC | PRN
  Administered 2020-10-28 (×2): 500 mL

## 2020-10-28 MED ORDER — LIDOCAINE-EPINEPHRINE 1 %-1:100000 IJ SOLN
INTRAMUSCULAR | Status: AC
  Filled 2020-10-28: qty 1

## 2020-10-28 MED ORDER — VITAMIN D (ERGOCALCIFEROL) 1.25 MG (50000 UNIT) PO CAPS: 50000.0000 [IU] | ORAL_CAPSULE | ORAL | Status: DC

## 2020-10-28 MED ORDER — CLOPIDOGREL BISULFATE 300 MG PO TABS
300.0000 mg | ORAL_TABLET | Freq: Once | ORAL | Status: AC
  Administered 2020-10-28: 300 mg via ORAL
  Filled 2020-10-28: qty 1

## 2020-10-28 MED ORDER — ROSUVASTATIN CALCIUM 20 MG PO TABS
20.0000 mg | ORAL_TABLET | Freq: Every day | ORAL | Status: DC
  Administered 2020-10-28 – 2020-10-30 (×2): 20 mg via ORAL
  Filled 2020-10-28 (×2): qty 1

## 2020-10-28 MED ORDER — ACETAMINOPHEN 500 MG PO TABS: 1000.0000 mg | ORAL_TABLET | Freq: Three times a day (TID) | ORAL | Status: DC | PRN

## 2020-10-28 SURGICAL SUPPLY — 64 items
ADH SKN CLS APL DERMABOND .7 (GAUZE/BANDAGES/DRESSINGS) ×2
BALLN MUSTANG 5X100X135 (BALLOONS) ×4
BALLOON MUSTANG 5X100X135 (BALLOONS) ×2 IMPLANT
BANDAGE ESMARK 6X9 LF (GAUZE/BANDAGES/DRESSINGS) IMPLANT
BNDG CMPR 9X6 STRL LF SNTH (GAUZE/BANDAGES/DRESSINGS)
BNDG ESMARK 6X9 LF (GAUZE/BANDAGES/DRESSINGS)
CANISTER SUCT 3000ML PPV (MISCELLANEOUS) ×4 IMPLANT
CANNULA VESSEL 3MM 2 BLNT TIP (CANNULA) IMPLANT
CATH QUICKCROSS SUPP .035X90CM (MICROCATHETER) ×4 IMPLANT
CLIP LIGATING EXTRA MED SLVR (CLIP) ×4 IMPLANT
CLIP LIGATING EXTRA SM BLUE (MISCELLANEOUS) ×4 IMPLANT
COVER DOME SNAP 22 D (MISCELLANEOUS) ×4 IMPLANT
COVER PROBE W GEL 5X96 (DRAPES) ×4 IMPLANT
DERMABOND ADVANCED (GAUZE/BANDAGES/DRESSINGS) ×2
DERMABOND ADVANCED .7 DNX12 (GAUZE/BANDAGES/DRESSINGS) ×2 IMPLANT
DRAPE C-ARM 42X72 X-RAY (DRAPES) IMPLANT
DRAPE HALF SHEET 40X57 (DRAPES) IMPLANT
DRAPE X-RAY CASS 24X20 (DRAPES) IMPLANT
ELECT REM PT RETURN 9FT ADLT (ELECTROSURGICAL) ×4
ELECTRODE REM PT RTRN 9FT ADLT (ELECTROSURGICAL) ×2 IMPLANT
GAUZE 4X4 16PLY ~~LOC~~+RFID DBL (SPONGE) ×4 IMPLANT
GLIDEWIRE ADV .035X180CM (WIRE) ×4 IMPLANT
GLOVE SURG ENC MOIS LTX SZ7.5 (GLOVE) ×4 IMPLANT
GLOVE SURG UNDER POLY LF SZ6.5 (GLOVE) ×4 IMPLANT
GOWN STRL REUS W/ TWL LRG LVL3 (GOWN DISPOSABLE) ×4 IMPLANT
GOWN STRL REUS W/ TWL XL LVL3 (GOWN DISPOSABLE) ×2 IMPLANT
GOWN STRL REUS W/TWL LRG LVL3 (GOWN DISPOSABLE) ×8
GOWN STRL REUS W/TWL XL LVL3 (GOWN DISPOSABLE) ×4
HEMOSTAT SNOW SURGICEL 2X4 (HEMOSTASIS) IMPLANT
INSERT FOGARTY SM (MISCELLANEOUS) IMPLANT
KIT BASIN OR (CUSTOM PROCEDURE TRAY) ×4 IMPLANT
KIT ENCORE 26 ADVANTAGE (KITS) ×4 IMPLANT
KIT MICROPUNCTURE NIT STIFF (SHEATH) ×4 IMPLANT
KIT TURNOVER KIT B (KITS) ×4 IMPLANT
NS IRRIG 1000ML POUR BTL (IV SOLUTION) ×8 IMPLANT
PACK PERIPHERAL VASCULAR (CUSTOM PROCEDURE TRAY) ×4 IMPLANT
PAD ARMBOARD 7.5X6 YLW CONV (MISCELLANEOUS) ×8 IMPLANT
PATCH VASC XENOSURE 1CMX6CM (Vascular Products) ×4 IMPLANT
PATCH VASC XENOSURE 1X6 (Vascular Products) ×2 IMPLANT
SHEATH PINNACLE ST 6F 45CM (SHEATH) ×4 IMPLANT
SPONGE T-LAP 18X18 ~~LOC~~+RFID (SPONGE) ×4 IMPLANT
STENT ELUVIA 6X100X130 (Permanent Stent) ×4 IMPLANT
STENT ELUVIA 6X120X130 (Permanent Stent) ×4 IMPLANT
STENT ELUVIA 6X80X130 (Permanent Stent) ×4 IMPLANT
STOPCOCK 3 WAY HIGH PRESSURE (MISCELLANEOUS) ×4
STOPCOCK 3WAY HIGH PRESSURE (MISCELLANEOUS) ×2 IMPLANT
STOPCOCK 4 WAY LG BORE MALE ST (IV SETS) IMPLANT
SUT ETHILON 3 0 PS 1 (SUTURE) IMPLANT
SUT MNCRL AB 4-0 PS2 18 (SUTURE) ×8 IMPLANT
SUT PROLENE 5 0 C 1 24 (SUTURE) ×20 IMPLANT
SUT PROLENE 6 0 BV (SUTURE) ×8 IMPLANT
SUT SILK 2 0 SH (SUTURE) ×4 IMPLANT
SUT SILK 3 0 (SUTURE)
SUT SILK 3-0 18XBRD TIE 12 (SUTURE) IMPLANT
SUT VIC AB 2-0 CT1 27 (SUTURE) ×12
SUT VIC AB 2-0 CT1 TAPERPNT 27 (SUTURE) ×6 IMPLANT
SUT VIC AB 3-0 SH 27 (SUTURE) ×8
SUT VIC AB 3-0 SH 27X BRD (SUTURE) ×4 IMPLANT
TOWEL GREEN STERILE (TOWEL DISPOSABLE) ×4 IMPLANT
TRAY FOLEY MTR SLVR 16FR STAT (SET/KITS/TRAYS/PACK) ×4 IMPLANT
TUBING CIL FLEX 10 FLL-RA (TUBING) ×4 IMPLANT
UNDERPAD 30X36 HEAVY ABSORB (UNDERPADS AND DIAPERS) ×4 IMPLANT
WATER STERILE IRR 1000ML POUR (IV SOLUTION) ×4 IMPLANT
WIRE ROSEN-J .035X260CM (WIRE) ×4 IMPLANT

## 2020-10-28 SURGICAL SUPPLY — 38 items
BALLN COYOTE OTW 3X120X150 (BALLOONS) ×3
BALLN STERLING OTW 3X220X150 (BALLOONS) ×3
BALLN STERLING OTW 5X220X150 (BALLOONS) ×3
BALLOON COYOTE OTW 3X120X150 (BALLOONS) ×2 IMPLANT
BALLOON STERLING OTW 3X220X150 (BALLOONS) ×2 IMPLANT
BALLOON STERLING OTW 5X220X150 (BALLOONS) ×2 IMPLANT
BNDG HMST LF TOP THROMBIX (HEMOSTASIS) ×2
CATH AURYON 5FR ATHEREC 1.5 (CATHETERS) ×3 IMPLANT
CATH CXI 2.3F 135 ST (CATHETERS) ×3 IMPLANT
CATH CXI 2.6F 65 ANG (CATHETERS) ×3
CATH CXI 2.6F 90 ANG (CATHETERS) ×3
CATH NAVICROSS ST 65CM (CATHETERS) ×2 IMPLANT
CATH QUICKCROSS SUPP .035X90CM (MICROCATHETER) ×6 IMPLANT
CATH SPRT ANG 65X2.3FR PLATN (CATHETERS) ×2 IMPLANT
CATH SPRT ANG 90X2.3FR ACPT (CATHETERS) ×2 IMPLANT
CATHETER NAVICROSS ST 65CM (CATHETERS) ×3
DEVICE ONE SNARE 10MM (MISCELLANEOUS) ×3 IMPLANT
GLIDESHEATH SLEND SS 6F .021 (SHEATH) ×3 IMPLANT
GLIDEWIRE ADV .035X260CM (WIRE) ×3 IMPLANT
KIT ENCORE 26 ADVANTAGE (KITS) ×3 IMPLANT
KIT MICROPUNCTURE NIT STIFF (SHEATH) ×3 IMPLANT
KIT PV (KITS) ×3 IMPLANT
PATCH THROMBIX TOPICAL PLAIN (HEMOSTASIS) ×3 IMPLANT
SHEATH MICROPUNCTURE PEDAL 4FR (SHEATH) ×3 IMPLANT
SHEATH MICROPUNCTURE PEDAL 5FR (SHEATH) ×3 IMPLANT
SHEATH PINNACLE 5F 10CM (SHEATH) ×3 IMPLANT
SHEATH PINNACLE 6F 10CM (SHEATH) ×3 IMPLANT
SHEATH PINNACLE 7F 10CM (SHEATH) ×3 IMPLANT
SHEATH PINNACLE ST 6F 45CM (SHEATH) ×6 IMPLANT
SHEATH PROBE COVER 6X72 (BAG) ×3 IMPLANT
STENT BIOMIMICS 5X60 (Permanent Stent) ×3 IMPLANT
SYR MEDRAD MARK 7 150ML (SYRINGE) ×3 IMPLANT
TRANSDUCER W/STOPCOCK (MISCELLANEOUS) ×3 IMPLANT
TRAY PV CATH (CUSTOM PROCEDURE TRAY) ×3 IMPLANT
WIRE BENTSON .035X145CM (WIRE) ×3 IMPLANT
WIRE G V18X300CM (WIRE) ×6 IMPLANT
WIRE SPARTACORE .014X300CM (WIRE) ×9 IMPLANT
WIRE TORQFLEX AUST .018X40CM (WIRE) ×3 IMPLANT

## 2020-10-28 NOTE — Discharge Instructions (Signed)
 [facility] [place]  Discharge instructions  Lower Extremity Bypass Surgery  Please refer to the following instruction for your post-procedure care. Your surgeon or physician assistant will discuss any changes with you.  Activity  You are encouraged to walk as much as you can. You can slowly return to normal activities during the month after your surgery. Avoid strenuous activity and heavy lifting until your doctor tells you it's OK. Avoid activities such as vacuuming or swinging a golf club. Do not drive until your doctor give the OK and you are no longer taking prescription pain medications. It is also normal to have difficulty with sleep habits, eating and bowel movement after surgery. These will go away with time.  Bathing/Showering  Shower daily after you go home. Do not soak in a bathtub, hot tub, or swim until the incision heals completely.  Incision Care  Clean your incision with mild soap and water. Shower every day. Pat the area dry with a clean towel. You do not need a bandage unless otherwise instructed. Do not apply any ointments or creams to your incision. If you have open wounds you will be instructed how to care for them or a visiting nurse may be arranged for you. If you have staples or sutures along your incision they will be removed at your post-op appointment. You may have skin glue on your incision. Do not peel it off. It will come off on its own in about one week.  Wash the groin wound with soap and water daily and pat dry. (No tub bath-only shower)  Then put a dry gauze or washcloth in the groin to keep this area dry to help prevent wound infection.  Do this daily and as needed.  Do not use Vaseline or neosporin on your incisions.  Only use soap and water on your incisions and then protect and keep dry.  Diet  Resume your normal diet. There are no special food restrictions following this procedure. A low fat/ low cholesterol diet is  recommended for all patients with vascular disease. In order to heal from your surgery, it is CRITICAL to get adequate nutrition. Your body requires vitamins, minerals, and protein. Vegetables are the best source of vitamins and minerals. Vegetables also provide the perfect balance of protein. Processed food has little nutritional value, so try to avoid this.  Medications  Resume taking all your medications unless your doctor or physician assistant tells you not to. If your incision is causing pain, you may take over-the-counter pain relievers such as acetaminophen (Tylenol). If you were prescribed a stronger pain medication, please aware these medication can cause nausea and constipation. Prevent nausea by taking the medication with a snack or meal. Avoid constipation by drinking plenty of fluids and eating foods with high amount of fiber, such as fruits, vegetables, and grains. Take Colace 100 mg (an over-the-counter stool softener) twice a day as needed for constipation.  Do not take Tylenol if you are taking prescription pain medications.  Follow Up  Our office will schedule a follow up appointment 2-3 weeks following discharge.  Please call us immediately for any of the following conditions  Severe or worsening pain in your legs or feet while at rest or while walking Increase pain, redness, warmth, or drainage (pus) from your incision site(s) Fever of 101 degree or higher The swelling in your leg with the bypass suddenly worsens and becomes more painful than when you were in the hospital If you have   been instructed to feel your graft pulse then you should do so every day. If you can no longer feel this pulse, call the office immediately. Not all patients are given this instruction.  Leg swelling is common after leg bypass surgery.  The swelling should improve over a few months following surgery. To improve the swelling, you may elevate your legs above the level of your heart while you are  sitting or resting. Your surgeon or physician assistant may ask you to apply an ACE wrap or wear compression (TED) stockings to help to reduce swelling.  Reduce your risk of vascular disease  Stop smoking. If you would like help call [facility] at 1-800-QUIT-NOW (1-800-784-8669) or [facility] at 336-586-4000.  Manage your cholesterol Maintain a desired weight Control your diabetes weight Control your diabetes Keep your blood pressure down  If you have any questions, please call the office at [phone]  

## 2020-10-28 NOTE — H&P (Signed)
History and Physical    Gary [name] PZW:[id number] DOB: [birth year] DOA: 10/28/2020  PCP: Janifer Adie, MD (Confirm with patient/family/NH records and if not entered, this has to be entered at [facility] point of entry) Patient coming from: Home  I have personally briefly reviewed patient's old medical records in [place]  Chief Complaint: Feeling ok  HPI: Gary [name] is a 65 y.o. male with medical history significant of PVD status post right BKA, chronic ischemia left lower extremity, chronic systolic CHF UMPN<36%, ESRD on HD MWF, HTN, HLD, COPD, IIDM, came with vascular intervention.  Patient underwent left common femoral, SFA and profundofemoral endarterectomy below-knee and stenting of SFA under general anesthesia.  Patient's condition stable after surgery in the recovery room, has no complaint of pain, no chest pain or shortness of breath.   Review of Systems: As per HPI otherwise 14 point review of systems negative.    Past Medical History:  Diagnosis Date   Blurred vision    Chronic kidney disease    sees [facility]   Constipation    Constipation    Diabetes mellitus    type 2   GERD (gastroesophageal reflux disease)    High cholesterol    Hypertension    Insomnia    Peripheral artery disease (HCC)    Port-wine stain of face    Left V1 distribution, including upper eyelid   Sensorineural hearing loss    left ear   Vitamin D deficiency     Past Surgical History:  Procedure Laterality Date   A/V FISTULAGRAM Left 01/20/2020   Procedure: A/V FISTULAGRAM;  Surgeon: Serafina Mitchell, MD;  Location: [place] [facility];  Service: Cardiovascular;  Laterality: Left;   ABDOMINAL AORTOGRAM W/LOWER EXTREMITY N/A 10/12/2020   Procedure: ABDOMINAL AORTOGRAM W/LOWER EXTREMITY;  Surgeon: Serafina Mitchell, MD;  Location: [place] [facility];  Service: Cardiovascular;  Laterality: N/A;   AMPUTATION Right 07/17/2011   AMPUTATION Left 10/03/2012   Procedure: AMPUTATION  DIGIT;  Surgeon: Serafina Mitchell, MD;  Location: [facility] OR;  Service: Vascular;  Laterality: Left;  Amputation of left Great  toe   AV FISTULA PLACEMENT Left 08/30/2017   Procedure: CREATION OF LEFT BRACHIOCEPHALIC ARTERIOVENOUS FISTULA;  Surgeon: Serafina Mitchell, MD;  Location: [place];  Service: Vascular;  Laterality: Left;   COLONOSCOPY     PERIPHERAL ARTERIAL STENT GRAFT N/A 2010   STUMP REVISION Right 01/28/2016   Procedure: Revision Right Below Knee Amputation;  Surgeon: Newt Minion, MD;  Location: [place];  Service: Orthopedics;  Laterality: Right;     reports that he has been smoking cigarettes. He has a 100.00 pack-year smoking history. He has never used smokeless tobacco. He reports that he does not drink alcohol and does not use drugs.  Allergies  Allergen Reactions   Oxycodone-Acetaminophen Rash and Other (See Comments)    Macular papular rash after two days on Oxy/APAP resolved with d/c of drug.    Family History  Problem Relation Age of Onset   Diabetes Mother    Hypertension Mother    Cancer Father    Colon cancer Neg Hx      Prior to Admission medications   Medication Sig Start Date End Date Taking? Authorizing Provider  acetaminophen (TYLENOL) 500 MG tablet Take 1,000 mg by mouth 3 (three) times daily as needed for mild pain.   Yes [provider]  albuterol (VENTOLIN HFA) 108 (90 Base) MCG/ACT inhaler Inhale 2 puffs into  the lungs every 6 (six) hours as needed for wheezing or shortness of breath.   Yes [provider]  aspirin 81 MG chewable tablet Chew 81 mg by mouth daily.   Yes [provider]  Benzocaine 20 % OINT Place 1 application rectally 5 (five) times daily as needed (hemmorhoids/anal fissure).   Yes [provider]  bisacodyl (DULCOLAX) 5 MG EC tablet Take 5 mg by mouth daily.   Yes [provider]  ciprofloxacin (CIPRO) 250 MG tablet Take 250 mg by mouth daily.   Yes [provider]  Dulaglutide (TRULICITY)  3 KJ/1.7HX SOPN Inject 3 mg into the skin once a week.   Yes [provider]  Evolocumab with Infusor ([place]) 420 MG/3.5ML SOCT Inject 420 mg into the skin every 30 (thirty) days.   Yes [provider]  famotidine (PEPCID) 40 MG tablet Take 40 mg by mouth at bedtime.   Yes [provider]  furosemide (LASIX) 80 MG tablet Take 80-120 mg by mouth See admin instructions. Take 1.5 tablets (120 mg) by mouth in the morning & take 1 tablet (80 mg) by mouth at noon for blood pressure/fluid/potassium   Yes [provider]  metoCLOPramide (REGLAN) 5 MG tablet Take 5 mg by mouth 2 (two) times daily as needed for nausea or vomiting.   Yes [provider]  multivitamin (RENA-VIT) TABS tablet Take 1 tablet by mouth daily.   Yes [provider]  Nitroglycerin (RECTIV) 0.4 % OINT Place 1 application rectally every 12 (twelve) hours as needed (hemmorhoids/anal fissure).   Yes [provider]  Nutritional Supplements (FEEDING SUPPLEMENT, NEPRO CARB STEADY,) LIQD Take 240 mLs by mouth daily.   Yes [provider]  polyethylene glycol (MIRALAX / GLYCOLAX) 17 g packet Take 17 g by mouth daily.   Yes [provider]  rosuvastatin (CRESTOR) 20 MG tablet Take 20 mg by mouth daily.    Yes [provider]  senna-docusate (SENOKOT-S) 8.6-50 MG tablet Take 2 tablets by mouth in the morning and at bedtime.   Yes [provider]  umeclidinium-vilanterol (ANORO ELLIPTA) 62.5-25 MCG/INH AEPB Inhale 1 puff into the lungs daily.   Yes [provider]  Vitamin D, Ergocalciferol, (DRISDOL) 1.25 MG (50000 UNIT) CAPS capsule Take 50,000 Units by mouth every 30 (thirty) days.   Yes [provider]  mupirocin ointment (BACTROBAN) 2 % Apply 1 application topically 2 (two) times daily. Patient not taking: Reported on 10/26/2020 09/14/20   Trula Slade, [place]    Physical Exam: Vitals:   10/28/20 1414  10/28/20 1800 10/28/20 1815 10/28/20 1830  BP: 133/77 97/83 (!) 93/41 112/64  Pulse: 82 79 84 94  Resp: 15 18 12 17   Temp:  98.9 F (37.2 C)    TempSrc:      SpO2: 98% 95% 91% 96%  Weight:      Height:        Constitutional: NAD, calm, comfortable Vitals:   10/28/20 1414 10/28/20 1800 10/28/20 1815 10/28/20 1830  BP: 133/77 97/83 (!) 93/41 112/64  Pulse: 82 79 84 94  Resp: 15 18 12 17   Temp:  98.9 F (37.2 C)    TempSrc:      SpO2: 98% 95% 91% 96%  Weight:      Height:       Eyes: PERRL, lids and conjunctivae normal ENMT: Mucous membranes are moist. Posterior pharynx clear of any exudate or lesions.Normal dentition.  Neck: normal, supple, no masses,  no thyromegaly Respiratory: clear to auscultation bilaterally, no wheezing, no crackles. Normal respiratory effort. No accessory muscle use.  Cardiovascular: Regular rate and rhythm, no murmurs / rubs / gallops. No extremity edema. 2+ pedal pulses. No carotid bruits.  Abdomen: no tenderness, no masses palpated. No hepatosplenomegaly. Bowel sounds positive.  Musculoskeletal: no clubbing / cyanosis. No joint deformity upper and lower extremities. Good ROM, no contractures. Normal muscle tone.  Skin: no rashes, lesions, ulcers. No induration Neurologic: CN 2-12 grossly intact. Sensation intact, DTR normal. Strength 5/5 in all 4.  Psychiatric: Normal judgment and insight. Alert and oriented x 3. Normal mood.    Labs on Admission: I have personally reviewed following labs and imaging studies  CBC: Recent Labs  Lab 10/28/20 0730 10/28/20 1551  HGB 11.6* 11.2*  HCT 34.0* 81.4*   Basic Metabolic Panel: Recent Labs  Lab 10/28/20 0730 10/28/20 1551  NA 134* 136  K 5.5* 3.6  CL 94*  --   GLUCOSE 173*  --   BUN 65*  --   CREATININE 7.30*  --    GFR: Estimated Creatinine Clearance: 8.8 mL/min (A) (by C-G formula based on SCr of 7.3 mg/dL (H)). Liver Function Tests: No results for input(s): AST, ALT, ALKPHOS, BILITOT, PROT,  ALBUMIN in the last 168 hours. No results for input(s): LIPASE, AMYLASE in the last 168 hours. No results for input(s): AMMONIA in the last 168 hours. Coagulation Profile: No results for input(s): INR, PROTIME in the last 168 hours. Cardiac Enzymes: No results for input(s): CKTOTAL, CKMB, CKMBINDEX, TROPONINI in the last 168 hours. BNP (last 3 results) No results for input(s): PROBNP in the last 8760 hours. HbA1C: No results for input(s): HGBA1C in the last 72 hours. CBG: Recent Labs  Lab 10/28/20 1800  GLUCAP 139*   Lipid Profile: No results for input(s): CHOL, HDL, LDLCALC, TRIG, CHOLHDL, LDLDIRECT in the last 72 hours. Thyroid Function Tests: No results for input(s): TSH, T4TOTAL, FREET4, T3FREE, THYROIDAB in the last 72 hours. Anemia Panel: No results for input(s): VITAMINB12, FOLATE, FERRITIN, TIBC, IRON, RETICCTPCT in the last 72 hours. Urine analysis:    Component Value Date/Time   COLORURINE YELLOW 10/15/2008 0959   APPEARANCEUR CLOUDY (A) 10/15/2008 0959   LABSPEC 1.025 10/15/2008 0959   PHURINE 5.5 10/15/2008 0959   GLUCOSEU NEGATIVE 10/15/2008 0959   HGBUR NEGATIVE 10/15/2008 0959   BILIRUBINUR SMALL (A) 10/15/2008 0959   KETONESUR NEGATIVE 10/15/2008 0959   PROTEINUR NEGATIVE 10/15/2008 0959   UROBILINOGEN 1.0 10/15/2008 0959   NITRITE NEGATIVE 10/15/2008 0959   LEUKOCYTESUR LARGE (A) 10/15/2008 0959    Radiological Exams on Admission: PERIPHERAL VASCULAR CATHETERIZATION  Result Date: 10/28/2020 Images from the original result were not included. Patient name: Gary [name] MRN: [id number] DOB: 02/26/1956 Sex: male 10/28/2020 Pre-operative Diagnosis: Chronic left lower extremity limb threatening ischemia with toe ulceration Post-operative diagnosis:  Same Surgeon:  Erlene Quan C. Donzetta Matters, MD Procedure Performed: 1.  Ultrasound-guided cannulation left common femoral artery in an antegrade fashion 2.  Left lower extremity angiography 3.  Ultrasound-guided cannulation left  posterior tibial artery 4.  Laser arthrectomy left SFA, popliteal artery, tibioperoneal trunk and posterior tibial artery 5.  Balloon angioplasty of left SFA with 5 mm balloon 6.  Stent of left popliteal artery with 5 mm bio mimics 7.  Plain balloon angioplasty left peroneal and posterior tibial arteries with 3 mm balloon 8.  Moderate sedation with fentanyl and Versed for 171 minutes Indications: 65 year old male with right lower extremity amputation and history  of an aortobifemoral bypass graft remotely.  He now has left second toe ulceration he is indicated for angiography with possible intervention. Findings: Left common femoral artery did have significant scar tissue in the left common femoral artery was heavily diseased.  After cannulated with micropuncture needle I placed contrast it appeared that the SFA had stenosis proximally and the flow was directed into the profunda.  I was able to direct a wire into the SFA and then place sheath.  The SFA was severely diseased multiple areas of 90% stenosis and/or occlusions.  It did frankly occlude at the level of the knee and the runoff was via the peroneal artery.  I was able to get through the all of the stenoses and occlusions initially perform laser arthrectomy down the tibioperoneal trunk I had runoff via the peroneal artery.  I cannulated the posterior tibial artery retrograde in an effort to get retrograde around the SFA sheath to treat the proximal disease.  Ultimately I did have better flow in the posterior tibial artery peroneal artery appeared to occlude after balloon angioplasty of the posterior tibial artery.  The SFA was heavily diseased.  Unfortunately had dissection from both antegrade and retrograde area up into the common femoral artery.  With this I have elected for left lower extremity thrombectomy.  I did place a stent to the left popliteal artery which resolved the previously occluded area to less than 20% stenosis.  Dominant runoff is via the  posterior tibial artery at completion. Patient is going emergently to the operating room for left lower extremity thrombectomy, left lower extremity angiography and possible bypass.  Procedure:  The patient was identified in the holding area and taken to room 8.  The patient was then placed supine on the table and prepped and draped in the usual sterile fashion.  A time out was called.  Ultrasound was used to evaluate the left common femoral artery.  This was heavily diseased.  There is anesthetized 1% lidocaine cannulated into antegrade fashion with micropuncture needle followed by wire.  Initially the wire was going down the profunda.  Performed angiography which demonstrated stenosis at the proximal SFA.  I was able to direct a wire into the SFA and placed a micropuncture sheath and a Bentson wire followed by 5 [place] sheath.  I initially cannulated with ultrasound guidance and an image saved per record.  I perform left lower extremity angiography with the above findings.  I then placed a Glidewire advantage down to the level of popliteal artery where the occluded placed a long 6 French sheath did have to dilate with a 7 [place] dilator.  Patient was fully heparinized at this time.  I did get antegrade through the occluded popliteal artery using Glidewire advantage quick cross catheter.  I have confirmed intraluminal access in the peroneal artery.  I then performed laser arthrectomy of the SFA popliteal tibioperoneal trunk and the peroneal artery.  I then performed primary balloon angioplasty.  Unfortunately proximally where the sheath was in the SFA there was severe disease appear to be dissected.  Did ultimately stent the left popliteal artery to 0% residual stenosis were previously was occluded.  I also ballooned to the peroneal tibioperoneal trunk there was good resolution of stenosis there as well.  Proximally where there was significant stenoses and occlusions in the SFA it did appear dissected.  I elected  that I would need posterior tibial access to get up retrograde to attempt to treat the proximal SFA around the common  femoral sheath.  I cannulated the left posterior tibial artery with micropuncture needle followed by wire and sheath using ultrasound guidance.  Image saved per record.  I was able to drive a V 18 wire directly up to the tibioperoneal trunk bifurcation and then crossed over with CXI cath and confirmed intraluminal access.  I think at this wire all the way into the SFA.  I performed laser arthrectomy of the posterior tibial artery from the tibioperoneal trunk and then balloon angioplasty with 3 mm balloon.  Completion demonstrated that there was no residual stenosis in the posterior tibial artery however the peroneal artery did not fill antegrade at this time.  I elected that I would leave the posterior tibial artery as the main runoff.  I then went into the SFA and perform retrograde angiography.  Ultimately I had snared the wire in a retrograde fashion to do this performing laser arthrectomy and balloon angioplasty.  I attempted now to redirect a wire around the sheath but it was in a dissection plane via angiography.  I elected that there was no way that I would be able to get out of the dissection plane and the patient would need surgical intervention.  I did have the balloon angioplasty of the posterior tibial artery again to 0% residual stenosis.  I then remove the sheath from the posterior tibial artery and pressure was held.  I exchanged for short 6 French sheath in the left groin and this will be removed in surgery.  He did tolerate the procedure well with complication of dissection of the left common femoral artery and will require operative intervention. Contrast: 110cc Brandon C. Donzetta Matters, MD [facility] Office: [phone] Pager: [place] ([place])  Result Date: 10/28/2020 There is no interpretation for this exam.  This order is for  images obtained during a surgical procedure.  Please See "Surgeries" Tab for more information regarding the procedure.    EKG: Ordered  Assessment/Plan Active Problems:   Limb ischemia  (please populate well all problems here in Problem List. (For example, if patient is on BP meds at home and you resume or decide to hold them, it is a problem that needs to be her. Same for CAD, COPD, HLD and so on)   Left lower extremity chronic ischemia -Status post left femoral, SFA endarterectomy and stenting. -Checking Doppler the recovery room showed good pulses on dorsalis. -Discussed with vascular PA, recommend discontinue heparin drip and start aspirin and Plavix regimen.  Admit to stepdown unit for close monitoring.  Chronic systolic CHF -Euvolemic, continue p.o. Lasix  COPD -Stable, no symptoms or signs of acute exacerbation  IIDM -Start sliding scale for now  CAD -No complaint of chest pain, check postop EKG.  DVT prophylaxis: Heparin subQ Code Status: Full Code Family Communication: None at bedside Disposition Plan: Expect 1-2 days hospital stay Consults called: Vascular surgery Admission status: PCU   Lequita Halt MD [facility] Pager [phone]  10/28/2020, 6:41 PM

## 2020-10-28 NOTE — Progress Notes (Signed)
Patient's belongs at circulator's desk in the [place] along with a pink denture cup containing his dentures that were removed prior to intubation.

## 2020-10-28 NOTE — Anesthesia Procedure Notes (Addendum)
Arterial Line Insertion Start/End6/30/2022 3:25 PM, 10/28/2020 3:35 PM Performed by: [name], MD  Patient location: Pre-op. Preanesthetic checklist: patient identified, IV checked, site marked, risks and benefits discussed, surgical consent, monitors and equipment checked, pre-op evaluation, timeout performed and anesthesia consent Lidocaine 1% used for infiltration Right, radial was placed Catheter size: 20 Fr Hand hygiene performed  and maximum sterile barriers used   Attempts: 2 Procedure performed using ultrasound guided technique. Ultrasound Notes:anatomy identified, needle tip was noted to be adjacent to the nerve/plexus identified and no ultrasound evidence of intravascular and/or intraneural injection Following insertion, dressing applied and Biopatch. Post procedure assessment: normal and unchanged  Patient tolerated the procedure well with no immediate complications.

## 2020-10-28 NOTE — Op Note (Signed)
Patient name: Gary [name] MRN: [id number] DOB: [birth year] Sex: male  10/28/2020 Pre-operative Diagnosis: Chronic left lower extremity limb threatening ischemia with toe ulceration Post-operative diagnosis:  Same Surgeon:  Erlene Quan C. Donzetta Matters, MD Procedure Performed: 1.  Ultrasound-guided cannulation left common femoral artery in an antegrade fashion 2.  Left lower extremity angiography 3.  Ultrasound-guided cannulation left posterior tibial artery 4.  Laser arthrectomy left SFA, popliteal artery, tibioperoneal trunk and posterior tibial artery 5.  Balloon angioplasty of left SFA with 5 mm balloon 6.  Stent of left popliteal artery with 5 mm bio mimics 7.  Plain balloon angioplasty left peroneal and posterior tibial arteries with 3 mm balloon 8.  Moderate sedation with fentanyl and Versed for 171 minutes   Indications: 65 year old male with right lower extremity amputation and history of an aortobifemoral bypass graft remotely.  He now has left second toe ulceration he is indicated for angiography with possible intervention.  Findings: Left common femoral artery did have significant scar tissue in the left common femoral artery was heavily diseased.  After cannulated with micropuncture needle I placed contrast it appeared that the SFA had stenosis proximally and the flow was directed into the profunda.  I was able to direct a wire into the SFA and then place sheath.  The SFA was severely diseased multiple areas of 90% stenosis and/or occlusions.  It did frankly occlude at the level of the knee and the runoff was via the peroneal artery.  I was able to get through the all of the stenoses and occlusions initially perform laser arthrectomy down the tibioperoneal trunk I had runoff via the peroneal artery.  I cannulated the posterior tibial artery retrograde in an effort to get retrograde around the SFA sheath to treat the proximal disease.  Ultimately I did have better flow in the posterior tibial  artery peroneal artery appeared to occlude after balloon angioplasty of the posterior tibial artery.  The SFA was heavily diseased.  Unfortunately had dissection from both antegrade and retrograde area up into the common femoral artery.  With this I have elected for left lower extremity thrombectomy.  I did place a stent to the left popliteal artery which resolved the previously occluded area to less than 20% stenosis.  Dominant runoff is via the posterior tibial artery at completion.  Patient is going emergently to the operating room for left lower extremity thrombectomy, left lower extremity angiography and possible bypass.   Procedure:  The patient was identified in the holding area and taken to room 8.  The patient was then placed supine on the table and prepped and draped in the usual sterile fashion.  A time out was called.  Ultrasound was used to evaluate the left common femoral artery.  This was heavily diseased.  There is anesthetized 1% lidocaine cannulated into antegrade fashion with micropuncture needle followed by wire.  Initially the wire was going down the profunda.  Performed angiography which demonstrated stenosis at the proximal SFA.  I was able to direct a wire into the SFA and placed a micropuncture sheath and a Bentson wire followed by 5 [place] sheath.  I initially cannulated with ultrasound guidance and an image saved per record.  I perform left lower extremity angiography with the above findings.  I then placed a Glidewire advantage down to the level of popliteal artery where the occluded placed a long 6 French sheath did have to dilate with a 7 [place] dilator.  Patient was fully heparinized at this  time.  I did get antegrade through the occluded popliteal artery using Glidewire advantage quick cross catheter.  I have confirmed intraluminal access in the peroneal artery.  I then performed laser arthrectomy of the SFA popliteal tibioperoneal trunk and the peroneal artery.  I then performed  primary balloon angioplasty.  Unfortunately proximally where the sheath was in the SFA there was severe disease appear to be dissected.  Did ultimately stent the left popliteal artery to 0% residual stenosis were previously was occluded.  I also ballooned to the peroneal tibioperoneal trunk there was good resolution of stenosis there as well.  Proximally where there was significant stenoses and occlusions in the SFA it did appear dissected.  I elected that I would need posterior tibial access to get up retrograde to attempt to treat the proximal SFA around the common femoral sheath.  I cannulated the left posterior tibial artery with micropuncture needle followed by wire and sheath using ultrasound guidance.  Image saved per record.  I was able to drive a V 18 wire directly up to the tibioperoneal trunk bifurcation and then crossed over with CXI cath and confirmed intraluminal access.  I think at this wire all the way into the SFA.  I performed laser arthrectomy of the posterior tibial artery from the tibioperoneal trunk and then balloon angioplasty with 3 mm balloon.  Completion demonstrated that there was no residual stenosis in the posterior tibial artery however the peroneal artery did not fill antegrade at this time.  I elected that I would leave the posterior tibial artery as the main runoff.  I then went into the SFA and perform retrograde angiography.  Ultimately I had snared the wire in a retrograde fashion to do this performing laser arthrectomy and balloon angioplasty.  I attempted now to redirect a wire around the sheath but it was in a dissection plane via angiography.  I elected that there was no way that I would be able to get out of the dissection plane and the patient would need surgical intervention.  I did have the balloon angioplasty of the posterior tibial artery again to 0% residual stenosis.  I then remove the sheath from the posterior tibial artery and pressure was held.  I exchanged for  short 6 French sheath in the left groin and this will be removed in surgery.  He did tolerate the procedure well with complication of dissection of the left common femoral artery and will require operative intervention.  Contrast: 110cc   Nazia Rhines C. Donzetta Matters, MD [facility] Office: [phone] Pager: [phone]

## 2020-10-28 NOTE — Anesthesia Preprocedure Evaluation (Addendum)
Anesthesia Evaluation  Patient identified by MRN, date of birth, ID band Patient awake    Reviewed: Allergy & Precautions, NPO status , Patient's Chart, lab work & pertinent test results  Airway Mallampati: I  TM Distance: >3 FB Neck ROM: Full    Dental  (+) Upper Dentures, Partial Lower, Dental Advisory Given   Pulmonary Current Smoker,    breath sounds clear to auscultation       Cardiovascular hypertension, + Peripheral Vascular Disease  + dysrhythmias  Rhythm:Regular Rate:Normal     Neuro/Psych    GI/Hepatic Neg liver ROS, GERD  Medicated,  Endo/Other  diabetesHypothyroidism   Renal/GU ESRF and DialysisRenal disease     Musculoskeletal   Abdominal Normal abdominal exam  (+)   Peds  Hematology   Anesthesia Other Findings   Reproductive/Obstetrics                            Anesthesia Physical Anesthesia Plan  ASA: 4 and emergent  Anesthesia Plan: General   Post-op Pain Management:    Induction: Intravenous  PONV Risk Score and Plan: 2 and Ondansetron  Airway Management Planned: Oral ETT  Additional Equipment: Arterial line  Intra-op Plan:   Post-operative Plan: Extubation in OR  Informed Consent: I have reviewed the patients History and Physical, chart, labs and discussed the procedure including the risks, benefits and alternatives for the proposed anesthesia with the patient or authorized representative who has indicated his/her understanding and acceptance.       Plan Discussed with: CRNA  Anesthesia Plan Comments:       Anesthesia Quick Evaluation

## 2020-10-28 NOTE — Anesthesia Procedure Notes (Signed)
Procedure Name: Intubation Date/Time: 10/28/2020 3:02 PM Performed by: Janace Litten, CRNA Pre-anesthesia Checklist: Patient identified, Emergency Drugs available, Suction available and Patient being monitored Patient Re-evaluated:Patient Re-evaluated prior to induction Oxygen Delivery Method: Circle System Utilized Preoxygenation: Pre-oxygenation with 100% oxygen Induction Type: IV induction Ventilation: Mask ventilation without difficulty Laryngoscope Size: Mac and 4 Grade View: Grade I Tube type: Oral Tube size: 7.5 mm Number of attempts: 1 Airway Equipment and Method: Stylet and Oral airway Placement Confirmation: ETT inserted through vocal cords under direct vision, positive ETCO2 and breath sounds checked- equal and bilateral Secured at: 22 cm Tube secured with: Tape Dental Injury: Teeth and Oropharynx as per pre-operative assessment

## 2020-10-28 NOTE — Interval H&P Note (Signed)
History and Physical Interval Note:  10/28/2020 7:21 AM  Gary [name]  has presented today for surgery, with the diagnosis of pad.  The various methods of treatment have been discussed with the patient and family. After consideration of risks, benefits and other options for treatment, the patient has consented to  Procedure(s): ABDOMINAL AORTOGRAM W/LOWER EXTREMITY (N/A) as a surgical intervention.  The patient's history has been reviewed, patient examined, no change in status, stable for surgery.  I have reviewed the patient's chart and labs.  Questions were answered to the patient's satisfaction.     Servando Snare

## 2020-10-28 NOTE — Transfer of Care (Signed)
Immediate Anesthesia Transfer of Care Note  Patient: Gary [name]  Procedure(s) Performed: PATCH ANGIOPLASTY OF LEFT COMMON FEMORAL ARTERY (Left: Groin) LEFT LOWER EXTREMITY  ANGIOGRAM WITH BALLOON ANGIOPLASTY AND SUPERIFICIAL STENT PLACEMENT (Left: Leg Lower) ENDARTERECTOMY COMMON FEMORAL ARTERY (Left: Groin)  Patient Location: PACU  Anesthesia Type:General  Level of Consciousness: drowsy, patient cooperative and responds to stimulation  Airway & Oxygen Therapy: Patient Spontanous Breathing  Post-op Assessment: Report given to RN and Post -op Vital signs reviewed and stable  Post vital signs: Reviewed and stable  Last Vitals:  Vitals Value Taken Time  BP 97/83 10/28/20 1757  Temp    Pulse 52 10/28/20 1759  Resp 9 10/28/20 1759  SpO2 86 % 10/28/20 1759  Vitals shown include unvalidated device data.  Last Pain:  Vitals:   10/28/20 1351  TempSrc:   PainSc: 0-No pain         Complications: No notable events documented.

## 2020-10-28 NOTE — Op Note (Signed)
    I discussed with the patient who has had moderate sedation and also his wife via telephone.  We will plan to go emergently to the operating room for left lower extremity thrombectomy he will require angiography with possible stenting versus bypass though he would be high risk for bypass.  I have also discussed the case with Dr. Trula Slade via telephone to update him of the plan.  Gorman Safi C. Donzetta Matters, MD [facility] Office: [phone] Pager: [phone]

## 2020-10-28 NOTE — Op Note (Signed)
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 Donzetta Matters, MD Assistants: Laurence Slate, PA; Leontine Locket, [place] Procedure Performed: 1.  Exposure left common femoral artery greater than 30 days 2.  Left common femoral, SFA and profunda femoral endarterectomy with bovine pericardial patch angioplasty 3.  Left lower extremity angiography 4.  Stent of left SFA with 6 x 80 distal 6 x 120 middle and 6 x 100 mm Elluvia   Indications: 65 year old male underwent angiography of his left lower extremity earlier today.  He now has dissected SFA into his left common femoral artery where he has previous aortobifemoral bypass.  He is indicated for the above-noted procedure.  An assistant was necessary The case.  Findings: There was a large circumferential plaque extending from his SFA up connected to his profunda and into his aortobifemoral bypass which appeared to be end-to-end.  This was detached from the wall and then acutely thrombosed behind it.  We performed extensive endarterectomy of the common femoral artery down into the SFA as far as we could reach in the profunda.  There was very strong backbleeding from the profunda.  We performed a bovine pericardial patch angioplasty.  Before extremity angiography was performed we then crossed the stenoses with a wire and stented with 3 drug-eluting stents.  Completion there were 0% residual stenosis.  There was good signal at the peroneal and a monophasic signal at the posterior tibial at the ankle at completion.   Procedure:  The patient was identified in the holding area and taken to the operating was put supine operative when general anesthesia was induced.  He was sterilely prepped draped in the left lower  extremity usual fashion antibiotics were ministered a timeout was called.  There was a sheath in place in the groin.  Lateral to this there was a previous incision which was opened and extended 1 cm cephalad and caudally.  We dissected down through skin subcutaneous tissue with cautery.  There was significant dense scar tissue there.  We dissected down to the SFA for swelling profunda and the bypass graft open the common femoral.  This appeared to be an end-to-end bypass.  Patient was given 5000 units of heparin replacements and femoral vessels.  We did remove the existing sheath and suture-ligated the cannulation zone 5-0 Prolene suture.  We then clamped all of our inflow and outflow vessels and opened the vessel longitudinally.  I encountered a circumferential plaque within the dissected with some acute thrombus from extensive endarterectomy including reaching forward into the SFA establishing some backbleeding there and good backbleeding from the profunda there was very strong inflow.  We thoroughly irrigated with heparinized saline and performed patch angioplasty with bovine pericardium.  Prior completion we de-aired and upon completion there was very strong pulsatility.  There was high resistance outflow signal in the SFA good signal in the profunda.  We then cannulated the patch with micropuncture needle followed wire and sheath.  Guidewire into the SFA.  We performed left lower extremity angiography.  We then placed a long 6 French sheath and this was sutured to the draped.  Patient was administered an additional 3000's of heparin.  We crossed the blockages with lumbar veins confirm intraluminal access placed a Rosen wire.  We then primarily stented with 3 drug-eluting stents.  These were  postdilated with 5 mm balloon.  Completion demonstrated no residual stenosis or dissection.  There was flow through the peroneal and the posterior tibial artery consistent with his previous exam.  We checked with Doppler he  had a strong peroneal signal and a weaker monophasic posterior tibial signal.  Satisfied with this we removed our sheath and suture ligated the cannulation site.  There were good Doppler signals in the proximal SFA and profunda.  We obtained stasis irrigated the wound and closed in layers with Vicryl and Monocryl.  Dermabond is placed at the level of skin.  He was awakened from anesthesia having tolerated without any complication.  Counts were correct at completion.   EBL: 100cc  44cc contrast   Fantashia Shupert C. Donzetta Matters, MD [facility] [place] Office: [phone] Pager: [phone]

## 2020-10-29 ENCOUNTER — Encounter (HOSPITAL_COMMUNITY): Payer: Self-pay | Admitting: Vascular Surgery

## 2020-10-29 ENCOUNTER — Inpatient Hospital Stay (HOSPITAL_COMMUNITY): Payer: Medicare (Managed Care)

## 2020-10-29 DIAGNOSIS — I120 Hypertensive chronic kidney disease with stage 5 chronic kidney disease or end stage renal disease: Secondary | ICD-10-CM

## 2020-10-29 DIAGNOSIS — N186 End stage renal disease: Secondary | ICD-10-CM

## 2020-10-29 DIAGNOSIS — F17200 Nicotine dependence, unspecified, uncomplicated: Secondary | ICD-10-CM

## 2020-10-29 DIAGNOSIS — E1122 Type 2 diabetes mellitus with diabetic chronic kidney disease: Secondary | ICD-10-CM

## 2020-10-29 DIAGNOSIS — Z992 Dependence on renal dialysis: Secondary | ICD-10-CM

## 2020-10-29 DIAGNOSIS — I739 Peripheral vascular disease, unspecified: Secondary | ICD-10-CM

## 2020-10-29 DIAGNOSIS — J9611 Chronic respiratory failure with hypoxia: Secondary | ICD-10-CM

## 2020-10-29 DIAGNOSIS — I10 Essential (primary) hypertension: Secondary | ICD-10-CM

## 2020-10-29 LAB — RESP PANEL BY RT-PCR (FLU A&B, COVID) ARPGX2
Influenza A by PCR: NEGATIVE
Influenza B by PCR: NEGATIVE
SARS Coronavirus 2 by RT PCR: NEGATIVE

## 2020-10-29 LAB — CBC
HCT: 30.1 % — ABNORMAL LOW (ref 39.0–52.0)
Hemoglobin: 9.1 g/dL — ABNORMAL LOW (ref 13.0–17.0)
MCH: 23.7 pg — ABNORMAL LOW (ref 26.0–34.0)
MCHC: 30.2 g/dL (ref 30.0–36.0)
MCV: 78.4 fL — ABNORMAL LOW (ref 80.0–100.0)
Platelets: 173 10*3/uL (ref 150–400)
RBC: 3.84 MIL/uL — ABNORMAL LOW (ref 4.22–5.81)
RDW: 16.6 % — ABNORMAL HIGH (ref 11.5–15.5)
WBC: 13.3 10*3/uL — ABNORMAL HIGH (ref 4.0–10.5)
nRBC: 0 % (ref 0.0–0.2)

## 2020-10-29 LAB — HEPATITIS B SURFACE ANTIBODY,QUALITATIVE: Hep B S Ab: REACTIVE — AB

## 2020-10-29 LAB — BASIC METABOLIC PANEL
Anion gap: 15 (ref 5–15)
BUN: 51 mg/dL — ABNORMAL HIGH (ref 8–23)
CO2: 26 mmol/L (ref 22–32)
Calcium: 8.5 mg/dL — ABNORMAL LOW (ref 8.9–10.3)
Chloride: 95 mmol/L — ABNORMAL LOW (ref 98–111)
Creatinine, Ser: 8.36 mg/dL — ABNORMAL HIGH (ref 0.61–1.24)
GFR, Estimated: 7 mL/min — ABNORMAL LOW (ref >60)
Glucose, Bld: 222 mg/dL — ABNORMAL HIGH (ref 70–99)
Potassium: 4.3 mmol/L (ref 3.5–5.1)
Sodium: 136 mmol/L (ref 135–145)

## 2020-10-29 LAB — CBC WITH DIFFERENTIAL/PLATELET
Abs Immature Granulocytes: 0.05 10*3/uL (ref 0.00–0.07)
Basophils Absolute: 0.1 10*3/uL (ref 0.0–0.1)
Basophils Relative: 0 %
Eosinophils Absolute: 0 10*3/uL (ref 0.0–0.5)
Eosinophils Relative: 0 %
HCT: 28.2 % — ABNORMAL LOW (ref 39.0–52.0)
Hemoglobin: 8.7 g/dL — ABNORMAL LOW (ref 13.0–17.0)
Immature Granulocytes: 0 %
Lymphocytes Relative: 11 %
Lymphs Abs: 1.3 10*3/uL (ref 0.7–4.0)
MCH: 23.7 pg — ABNORMAL LOW (ref 26.0–34.0)
MCHC: 30.9 g/dL (ref 30.0–36.0)
MCV: 76.8 fL — ABNORMAL LOW (ref 80.0–100.0)
Monocytes Absolute: 1 10*3/uL (ref 0.1–1.0)
Monocytes Relative: 8 %
Neutro Abs: 9.9 10*3/uL — ABNORMAL HIGH (ref 1.7–7.7)
Neutrophils Relative %: 81 %
Platelets: 177 10*3/uL (ref 150–400)
RBC: 3.67 MIL/uL — ABNORMAL LOW (ref 4.22–5.81)
RDW: 16.1 % — ABNORMAL HIGH (ref 11.5–15.5)
WBC: 12.3 10*3/uL — ABNORMAL HIGH (ref 4.0–10.5)
nRBC: 0 % (ref 0.0–0.2)

## 2020-10-29 LAB — RETICULOCYTES
Immature Retic Fract: 29.4 % — ABNORMAL HIGH (ref 2.3–15.9)
RBC.: 3.7 MIL/uL — ABNORMAL LOW (ref 4.22–5.81)
Retic Count, Absolute: 65.9 10*3/uL (ref 19.0–186.0)
Retic Ct Pct: 1.8 % (ref 0.4–3.1)

## 2020-10-29 LAB — HEPATITIS B SURFACE ANTIGEN: Hepatitis B Surface Ag: NONREACTIVE

## 2020-10-29 LAB — GLUCOSE, CAPILLARY
Glucose-Capillary: 186 mg/dL — ABNORMAL HIGH (ref 70–99)
Glucose-Capillary: 264 mg/dL — ABNORMAL HIGH (ref 70–99)
Glucose-Capillary: 199 mg/dL — ABNORMAL HIGH (ref 70–99)
Glucose-Capillary: 251 mg/dL — ABNORMAL HIGH (ref 70–99)

## 2020-10-29 LAB — HEPATITIS B CORE ANTIBODY, TOTAL: Hep B Core Total Ab: NONREACTIVE

## 2020-10-29 LAB — HEMOGLOBIN A1C
Hgb A1c MFr Bld: 9.4 % — ABNORMAL HIGH (ref 4.8–5.6)
Mean Plasma Glucose: 223.08 mg/dL

## 2020-10-29 MED ORDER — HEPARIN SODIUM (PORCINE) 1000 UNIT/ML DIALYSIS: 1000.0000 [IU] | INTRAMUSCULAR | Status: DC | PRN

## 2020-10-29 MED ORDER — SODIUM CHLORIDE 0.9 % IV SOLN: 100.0000 mL | INTRAVENOUS | Status: DC | PRN

## 2020-10-29 MED ORDER — LIDOCAINE-PRILOCAINE 2.5-2.5 % EX CREA: 1.0000 "application " | TOPICAL_CREAM | CUTANEOUS | Status: DC | PRN

## 2020-10-29 MED ORDER — ALTEPLASE 2 MG IJ SOLR: 2.0000 mg | Freq: Once | INTRAMUSCULAR | Status: DC | PRN

## 2020-10-29 MED ORDER — LIDOCAINE HCL (PF) 1 % IJ SOLN: 5.0000 mL | INTRAMUSCULAR | Status: DC | PRN

## 2020-10-29 MED ORDER — FAMOTIDINE 20 MG PO TABS
20.0000 mg | ORAL_TABLET | Freq: Every day | ORAL | Status: DC
  Administered 2020-10-29: 20 mg via ORAL
  Filled 2020-10-29: qty 1

## 2020-10-29 MED ORDER — PENTAFLUOROPROP-TETRAFLUOROETH EX AERO: 1.0000 "application " | INHALATION_SPRAY | CUTANEOUS | Status: DC | PRN

## 2020-10-29 MED ORDER — NICOTINE 14 MG/24HR TD PT24
14.0000 mg | MEDICATED_PATCH | Freq: Every day | TRANSDERMAL | Status: DC
  Administered 2020-10-29 – 2020-10-30 (×2): 14 mg via TRANSDERMAL
  Filled 2020-10-29 (×2): qty 1

## 2020-10-29 NOTE — Progress Notes (Addendum)
  Progress Note    10/29/2020 7:40 AM 1 Day Post-Op  Subjective:  no complaints.   Afebrile HR 80's-90's 220'U-542'H systolic 06% RA  Vitals:   10/28/20 2327 10/29/20 0547  BP: (!) 108/59 120/81  Pulse: 88 87  Resp: 14 18  Temp: 98.1 F (36.7 C) 98.1 F (36.7 C)  SpO2: 99% 97%    Physical Exam: Cardiac:  regular Lungs:  non labored Incisions:  clean and dry without hematoma Extremities:  +PT/AT doppler signals; calf is soft  CBC    Component Value Date/Time   WBC 12.3 (H) 10/29/2020 0530   RBC 3.67 (L) 10/29/2020 0530   RBC 3.70 (L) 10/29/2020 0530   HGB 8.7 (L) 10/29/2020 0530   HCT 28.2 (L) 10/29/2020 0530   PLT 177 10/29/2020 0530   MCV 76.8 (L) 10/29/2020 0530   MCH 23.7 (L) 10/29/2020 0530   MCHC 30.9 10/29/2020 0530   RDW 16.1 (H) 10/29/2020 0530   LYMPHSABS 1.3 10/29/2020 0530   MONOABS 1.0 10/29/2020 0530   EOSABS 0.0 10/29/2020 0530   BASOSABS 0.1 10/29/2020 0530    BMET    Component Value Date/Time   NA 136 10/29/2020 0530   K 4.3 10/29/2020 0530   CL 95 (L) 10/29/2020 0530   CO2 26 10/29/2020 0530   GLUCOSE 222 (H) 10/29/2020 0530   BUN 51 (H) 10/29/2020 0530   CREATININE 8.36 (H) 10/29/2020 0530   CREATININE 0.86 09/26/2012 1536   CALCIUM 8.5 (L) 10/29/2020 0530   GFRNONAA 7 (L) 10/29/2020 0530   GFRNONAA >89 09/26/2012 1536   GFRAA 6 (L) 06/12/2018 1056   GFRAA >89 09/26/2012 1536    INR    Component Value Date/Time   INR 1.0 10/15/2008 1804     Intake/Output Summary (Last 24 hours) at 10/29/2020 0740 Last data filed at 10/29/2020 0624 Gross per 24 hour  Intake 525 ml  Output 100 ml  Net 425 ml     Assessment/Plan:  65 y.o. male is s/p:  1.  Exposure left common femoral artery greater than 30 days 2.  Left common femoral, SFA and profunda femoral endarterectomy with bovine pericardial patch angioplasty 3.  Left lower extremity angiography 4.  Stent of left SFA with 6 x 80 distal 6 x 120 middle and 6 x 100 mm Elluvia  1  Day Post-Op   -pt with PT and AT doppler signals this morning.  Calf is soft and non tender.  Pt loaded with plavix yesterday and plavix 75mg  daily to start today.   -groin incision looks fine.   -ESRD-talked with Dr. Candiss Norse yesterday about admission and they will see him today and arrange HD.  -OOB and mobilizing today    Leontine Locket, PA-C [facility] [phone] 10/29/2020 7:40 AM  I have independently interviewed and examined patient and agree with PA assessment and plan above. Multiphasic left peroneal signal is his strongest. Foot is warm. Left groin incision healing well. Mobilize today and hd per nephrology. On Asa/plavix and statin.  Wessie Shanks C. Donzetta Matters, MD [facility] [place] Office: [phone] Pager: [phone]

## 2020-10-29 NOTE — Progress Notes (Signed)
PROGRESS NOTE    Gary [name]  OZH:[id number] DOB: 1956/01/15 DOA: 10/28/2020 PCP: Janifer Adie, MD    No chief complaint on file.   Brief Narrative:  Gary [name] is a 65 y.o. male with medical history significant of PVD status post right BKA, chronic ischemia left lower extremity, chronic systolic CHF GEXB<28%, ESRD on HD MWF, HTN, HLD, COPD, IIDM, came with vascular intervention.   Patient underwent left common femoral, SFA and profundofemoral endarterectomy below-knee and stenting of SFA under general anesthesia.   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 Therapy evaluations are pending.  Continue with aspirin, plavix and statin.    Chronic systolic CHF Last echo showed LVEF < 30%.  Continue with lasix, strict intake and output, daily weights.    COPD/ chronic respiratory failure with hypoxia on 2 lit of [place] oxygen at home.  No wheezing heard.  Still smoking cigarettes.  Added nicotine patch.    Type 2 dm;  Non insulin dependent.  Hemoglobin A1c is pending.  Continue with SSI.    Hypertension:  BP parameters are borderline low.    ESRD on HD; Nephrology on board.    Hyperlipidemia:  Continue with statin.   Anemia of chronic disease/ anemia of blood loss from the procedure.  - baseline hemoglobin is around 11, dropped to 9.1.  - transfuse to keep hemoglobin greater than 7.    DVT prophylaxis: heparin. Code Status:  Full code. Family Communication: none at bedside.  Disposition:   Status is: Inpatient  Remains inpatient appropriate because:Ongoing diagnostic testing needed not appropriate for outpatient work up  Dispo: The patient is from: Home              Anticipated d/c is to:  pending              Patient currently is not medically stable to d/c.   Difficult to place patient No       Consultants:  VASCULAR SURGERY.   Procedures: CXR Left common femoral, SFA and profunda femoral endarterectomy with bovine pericardial patch angioplasty  Left lower extremity angiography   Stent of left SFA with 6 x 80 distal 6 x 120 middle and 6 x 100 mm [place]    Antimicrobials: none.    Subjective: Reports feeling sob.   Objective: Vitals:   10/28/20 2300 10/28/20 2327 10/29/20 0547 10/29/20 0903  BP:  (!) 108/59 120/81 (!) 93/56  Pulse:  88 87 81  Resp: 14 14 18 16   Temp:  98.1 F (36.7 C) 98.1 F (36.7 C) 97.6 F (36.4 C)  TempSrc:  Oral Oral Oral  SpO2:  99% 97% 95%  Weight:      Height:        Intake/Output Summary (Last 24 hours) at 10/29/2020 1133 Last data filed at 10/29/2020 0624 Gross per 24 hour  Intake 525 ml  Output 100 ml  Net 425 ml   Filed Weights   10/28/20 0704  10/28/20 2012  Weight: 60.8 kg 56.2 kg    Examination:  General exam: Appears calm and comfortable  Respiratory system: diminished at bases, on 2 lit of [place] oxygen.  Cardiovascular system: S1 & S2 heard, RRR. No JVD,  No pedal edema. Gastrointestinal system: Abdomen is nondistended, soft and nontender. Normal bowel sounds heard. Central nervous system: Alert and oriented. No focal neurological deficits. Extremities: right BKA, left leg incision sites clean.  Skin: No rashes, lesions or ulcers Psychiatry: Mood & affect appropriate.     Data Reviewed: I have personally reviewed following labs and imaging studies  CBC: Recent Labs  Lab 10/28/20 0730 10/28/20 1551 10/29/20 0530 10/29/20 0848   WBC  --   --  12.3* 13.3*  NEUTROABS  --   --  9.9*  --   HGB 11.6* 11.2* 8.7* 9.1*  HCT 34.0* 33.0* 28.2* 30.1*  MCV  --   --  76.8* 78.4*  PLT  --   --  177 382    Basic Metabolic Panel: Recent Labs  Lab 10/28/20 0730 10/28/20 1551 10/29/20 0530  NA 134* 136 136  K 5.5* 3.6 4.3  CL 94*  --  95*  CO2  --   --  26  GLUCOSE 173*  --  222*  BUN 65*  --  51*  CREATININE 7.30*  --  8.36*  CALCIUM  --   --  8.5*    GFR: Estimated Creatinine Clearance: 7.1 mL/min (A) (by C-G formula based on SCr of 8.36 mg/dL (H)).  Liver Function Tests: No results for input(s): AST, ALT, ALKPHOS, BILITOT, PROT, ALBUMIN in the last 168 hours.  CBG: Recent Labs  Lab 10/28/20 1800 10/28/20 2047 10/29/20 0553  GLUCAP 139* 156* 186*     Recent Results (from the past 240 hour(s))  Resp Panel by RT-PCR (Flu A&B, Covid) Nasopharyngeal Swab     Status: None   Collection Time: 10/29/20  8:27 AM   Specimen: Nasopharyngeal Swab; Nasopharyngeal(NP) swabs in vial transport medium  Result Value Ref Range Status   SARS Coronavirus 2 by RT PCR NEGATIVE NEGATIVE Final    Comment: (NOTE) SARS-CoV-2 target nucleic acids are NOT DETECTED.  The SARS-CoV-2 RNA is generally detectable in upper respiratory specimens during the acute phase of infection. The lowest concentration of SARS-CoV-2 viral copies this assay can detect is 138 copies/mL. A negative result does not preclude SARS-Cov-2 infection and should not be used as the sole basis for treatment or other patient management decisions. A negative result may occur with  improper specimen collection/handling, submission of specimen other than nasopharyngeal swab, presence of viral mutation(s) within the areas targeted by this assay, and inadequate number of viral copies(<138 copies/mL). A negative result must be combined with clinical observations, patient history, and epidemiological information. The expected result is Negative.  Fact Sheet for  Patients:  EntrepreneurPulse.com.au  Fact Sheet for Healthcare Providers:  IncredibleEmployment.be  This test is no t yet approved or cleared by the [place] FDA and  has been authorized for detection and/or diagnosis of SARS-CoV-2 by FDA under an Emergency Use Authorization (EUA). This EUA will remain  in effect (meaning this test can be used) for the duration of the COVID-19 declaration under Section 564(b)(1) of the Act, 21 U.S.C.section 360bbb-3(b)(1), unless the authorization is terminated  or revoked sooner.       Influenza A by PCR NEGATIVE NEGATIVE Final   Influenza B by PCR NEGATIVE NEGATIVE Final    Comment: (NOTE) The Xpert  Xpress SARS-CoV-2/FLU/RSV plus assay is intended as an aid in the diagnosis of influenza from Nasopharyngeal swab specimens and should not be used as a sole basis for treatment. Nasal washings and aspirates are unacceptable for Xpert Xpress SARS-CoV-2/FLU/RSV testing.  Fact Sheet for Patients: EntrepreneurPulse.com.au  Fact Sheet for Healthcare Providers: IncredibleEmployment.be  This test is not yet approved or cleared by the [place] FDA and has been authorized for detection and/or diagnosis of SARS-CoV-2 by FDA under an Emergency Use Authorization (EUA). This EUA will remain in effect (meaning this test can be used) for the duration of the COVID-19 declaration under Section 564(b)(1) of the Act, 21 U.S.C. section 360bbb-3(b)(1), unless the authorization is terminated or revoked.  Performed at [facility], [place] [address] [place], [place] [address]          Radiology Studies: PERIPHERAL VASCULAR CATHETERIZATION  Result Date: 10/28/2020 Images from the original result were not included. Patient name: GREIG ALTERGOTT MRN: [id number] DOB: 01-05-1956 Sex: male 10/28/2020 Pre-operative Diagnosis: Chronic left lower extremity limb threatening ischemia with  toe ulceration Post-operative diagnosis:  Same Surgeon:  Erlene Quan C. Donzetta Matters, MD Procedure Performed: 1.  Ultrasound-guided cannulation left common femoral artery in an antegrade fashion 2.  Left lower extremity angiography 3.  Ultrasound-guided cannulation left posterior tibial artery 4.  Laser arthrectomy left SFA, popliteal artery, tibioperoneal trunk and posterior tibial artery 5.  Balloon angioplasty of left SFA with 5 mm balloon 6.  Stent of left popliteal artery with 5 mm bio mimics 7.  Plain balloon angioplasty left peroneal and posterior tibial arteries with 3 mm balloon 8.  Moderate sedation with fentanyl and Versed for 171 minutes Indications: 65 year old male with right lower extremity amputation and history of an aortobifemoral bypass graft remotely.  He now has left second toe ulceration he is indicated for angiography with possible intervention. Findings: Left common femoral artery did have significant scar tissue in the left common femoral artery was heavily diseased.  After cannulated with micropuncture needle I placed contrast it appeared that the SFA had stenosis proximally and the flow was directed into the profunda.  I was able to direct a wire into the SFA and then place sheath.  The SFA was severely diseased multiple areas of 90% stenosis and/or occlusions.  It did frankly occlude at the level of the knee and the runoff was via the peroneal artery.  I was able to get through the all of the stenoses and occlusions initially perform laser arthrectomy down the tibioperoneal trunk I had runoff via the peroneal artery.  I cannulated the posterior tibial artery retrograde in an effort to get retrograde around the SFA sheath to treat the proximal disease.  Ultimately I did have better flow in the posterior tibial artery peroneal artery appeared to occlude after balloon angioplasty of the posterior tibial artery.  The SFA was heavily diseased.  Unfortunately had dissection from both antegrade and  retrograde area up into the common femoral artery.  With this I have elected for left lower extremity thrombectomy.  I did place a stent to the left popliteal artery which resolved the previously occluded area to less than 20% stenosis.  Dominant runoff is via the posterior tibial artery at completion. Patient is going emergently to the operating room for left lower extremity thrombectomy, left lower extremity angiography and possible bypass.  Procedure:  The patient was identified in the holding area and taken to room 8.  The patient was then placed supine on the table  and prepped and draped in the usual sterile fashion.  A time out was called.  Ultrasound was used to evaluate the left common femoral artery.  This was heavily diseased.  There is anesthetized 1% lidocaine cannulated into antegrade fashion with micropuncture needle followed by wire.  Initially the wire was going down the profunda.  Performed angiography which demonstrated stenosis at the proximal SFA.  I was able to direct a wire into the SFA and placed a micropuncture sheath and a Bentson wire followed by 5 [place] sheath.  I initially cannulated with ultrasound guidance and an image saved per record.  I perform left lower extremity angiography with the above findings.  I then placed a Glidewire advantage down to the level of popliteal artery where the occluded placed a long 6 French sheath did have to dilate with a 7 [place] dilator.  Patient was fully heparinized at this time.  I did get antegrade through the occluded popliteal artery using Glidewire advantage quick cross catheter.  I have confirmed intraluminal access in the peroneal artery.  I then performed laser arthrectomy of the SFA popliteal tibioperoneal trunk and the peroneal artery.  I then performed primary balloon angioplasty.  Unfortunately proximally where the sheath was in the SFA there was severe disease appear to be dissected.  Did ultimately stent the left popliteal artery to 0%  residual stenosis were previously was occluded.  I also ballooned to the peroneal tibioperoneal trunk there was good resolution of stenosis there as well.  Proximally where there was significant stenoses and occlusions in the SFA it did appear dissected.  I elected that I would need posterior tibial access to get up retrograde to attempt to treat the proximal SFA around the common femoral sheath.  I cannulated the left posterior tibial artery with micropuncture needle followed by wire and sheath using ultrasound guidance.  Image saved per record.  I was able to drive a V 18 wire directly up to the tibioperoneal trunk bifurcation and then crossed over with CXI cath and confirmed intraluminal access.  I think at this wire all the way into the SFA.  I performed laser arthrectomy of the posterior tibial artery from the tibioperoneal trunk and then balloon angioplasty with 3 mm balloon.  Completion demonstrated that there was no residual stenosis in the posterior tibial artery however the peroneal artery did not fill antegrade at this time.  I elected that I would leave the posterior tibial artery as the main runoff.  I then went into the SFA and perform retrograde angiography.  Ultimately I had snared the wire in a retrograde fashion to do this performing laser arthrectomy and balloon angioplasty.  I attempted now to redirect a wire around the sheath but it was in a dissection plane via angiography.  I elected that there was no way that I would be able to get out of the dissection plane and the patient would need surgical intervention.  I did have the balloon angioplasty of the posterior tibial artery again to 0% residual stenosis.  I then remove the sheath from the posterior tibial artery and pressure was held.  I exchanged for short 6 French sheath in the left groin and this will be removed in surgery.  He did tolerate the procedure well with complication of dissection of the left common femoral artery and will  require operative intervention. Contrast: 110cc Brandon C. Donzetta Matters, MD [facility] Office: [phone] Pager: [place] ([place])  Result Date: 10/28/2020 There is no interpretation for this exam.  This order is for images obtained during a surgical procedure.  Please See "Surgeries" Tab for more information regarding the procedure.        Scheduled Meds:  sodium chloride   Intravenous Once   aspirin  81 mg Oral Daily   bisacodyl  5 mg Oral Daily   Chlorhexidine Gluconate Cloth  6 each Topical Q0600   ciprofloxacin  250 mg Oral Daily   clopidogrel  75 mg Oral Daily   famotidine  40 mg Oral QHS   feeding supplement (NEPRO CARB STEADY)  240 mL Oral Daily   furosemide  120 mg Oral Q0600   furosemide  80 mg Oral Q1200   heparin  5,000 Units Subcutaneous Q12H   insulin aspart  0-6 Units Subcutaneous TID WC   multivitamin  1 tablet Oral Daily   nicotine  14 mg Transdermal Daily   polyethylene glycol  17 g Oral Daily   rosuvastatin  20 mg Oral Daily   senna-docusate  2 tablet Oral BID   umeclidinium-vilanterol  1 puff Inhalation Daily   [START ON 11/26/2020] Vitamin D (Ergocalciferol)  50,000 Units Oral Q30 days   Continuous Infusions:  sodium chloride     sodium chloride       LOS: 1 day        Hosie Poisson, MD [facility]   To contact the attending provider between 7A-7P or the covering provider during after hours 7P-7A, please log into the web site [email or web address] and access using universal [place] password for that web site. If you do not have the password, please call the hospital operator.  10/29/2020, 11:33 AM

## 2020-10-29 NOTE — Consult Note (Signed)
[facility] Renal Consultation Note    Indication for Consultation:  Management of ESRD/hemodialysis; anemia, hypertension/volume and secondary hyperparathyroidism  GLO:VFIEPPI, Angelyn Punt, MD  HPI: Gary [name] is a 65 y.o. male. ESRD on HD MWF at [facility] 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  Cardiology deemed patient high risk for surgical revascularization.    Patient admitted yesterday following scheduled angiography and emergent L common femoral, SFA and profunda femoral endarterectomy with bovine pericardial patch angioplasty.    Today, patient seen and examined at bedside.  Denies pain.  Admits to nausea this AM but no vomiting or diarrhea. Reports being constipation, no BM in last 3 days.  Denies CP, SOB, cough, abdominal pain, weakness, dizziness and fatigue.  CXR with no acute findings.  Patient has been admitted for further management post procedure.     Past Medical History:  Diagnosis Date   Blurred vision    Chronic kidney disease    sees [facility]   Constipation    Constipation    Diabetes mellitus    type 2   GERD (gastroesophageal reflux disease)    High cholesterol    Hypertension    Insomnia    Peripheral artery disease (HCC)    Port-wine stain of face    Left V1 distribution, including upper eyelid   Sensorineural hearing loss    left ear   Vitamin D deficiency    Past Surgical History:  Procedure Laterality Date   A/V FISTULAGRAM Left 01/20/2020   Procedure: A/V FISTULAGRAM;  Surgeon: Serafina Mitchell, MD;  Location: [place] [facility];  Service: Cardiovascular;  Laterality: Left;   ABDOMINAL AORTOGRAM W/LOWER EXTREMITY N/A 10/12/2020   Procedure: ABDOMINAL AORTOGRAM W/LOWER  EXTREMITY;  Surgeon: Serafina Mitchell, MD;  Location: [place] [facility];  Service: Cardiovascular;  Laterality: N/A;   AMPUTATION Right 07/17/2011   AMPUTATION Left 10/03/2012   Procedure: AMPUTATION DIGIT;  Surgeon: Serafina Mitchell, MD;  Location: [place];  Service: Vascular;  Laterality: Left;  Amputation of left Great  toe   AV FISTULA PLACEMENT Left 08/30/2017   Procedure: CREATION OF LEFT BRACHIOCEPHALIC ARTERIOVENOUS FISTULA;  Surgeon: Serafina Mitchell, MD;  Location: [place];  Service: Vascular;  Laterality: Left;   COLONOSCOPY     ENDARTERECTOMY FEMORAL Left 10/28/2020   Procedure: ENDARTERECTOMY COMMON FEMORAL ARTERY;  Surgeon: Waynetta Sandy, MD;  Location: [place];  Service: Vascular;  Laterality: Left;   LOWER EXTREMITY ANGIOGRAM Left 10/28/2020   Procedure: LEFT LOWER EXTREMITY  ANGIOGRAM WITH BALLOON ANGIOPLASTY AND SUPERIFICIAL FEMORAL ARTERY STENT PLACEMENT;  Surgeon: Waynetta Sandy, MD;  Location: [place];  Service: Vascular;  Laterality: Left;   LOWER EXTREMITY ANGIOGRAPHY Left 10/28/2020   Procedure: Lower Extremity Angiography;  Surgeon: Waynetta Sandy, MD;  Location: [place] [facility];  Service: Cardiovascular;  Laterality: Left;   PATCH ANGIOPLASTY Left 10/28/2020   Procedure: PATCH ANGIOPLASTY OF LEFT COMMON FEMORAL ARTERY;  Surgeon: Waynetta Sandy, MD;  Location: [place];  Service: Vascular;  Laterality: Left;   PERIPHERAL ARTERIAL STENT GRAFT N/A 2010   PERIPHERAL VASCULAR ATHERECTOMY Left 10/28/2020   Procedure: PERIPHERAL VASCULAR ATHERECTOMY;  Surgeon: Waynetta Sandy, MD;  Location: [place] [facility];  Service: Cardiovascular;  Laterality: Left;  femoral/popliteal;  and posterior tibial   PERIPHERAL VASCULAR BALLOON ANGIOPLASTY Left 10/28/2020   Procedure: PERIPHERAL VASCULAR BALLOON ANGIOPLASTY;  Surgeon: Waynetta Sandy, MD;  Location: [place] [facility];  Service: Cardiovascular;  Laterality: Left;  PT/ SFA    PERIPHERAL VASCULAR INTERVENTION Left 10/28/2020   Procedure: PERIPHERAL VASCULAR INTERVENTION;  Surgeon: Waynetta Sandy, MD;  Location: [place] [facility];  Service: Cardiovascular;  Laterality: Left;  popliteal   STUMP REVISION Right 01/28/2016   Procedure: Revision Right Below Knee Amputation;  Surgeon: Newt Minion, MD;  Location: [place];  Service: Orthopedics;  Laterality: Right;   Family History  Problem Relation Age of Onset   Diabetes Mother    Hypertension Mother    Cancer Father    Colon cancer Neg Hx    Social History:  reports that he has been smoking cigarettes. He has a 100.00 pack-year smoking history. He has never used smokeless tobacco. He reports that he does not drink alcohol and does not use drugs. Allergies  Allergen Reactions   Oxycodone-Acetaminophen Rash and Other (See Comments)    Macular papular rash after two days on Oxy/APAP resolved with d/c of drug.   Prior to Admission medications   Medication Sig Start Date End Date Taking? Authorizing Provider  acetaminophen (TYLENOL) 500 MG tablet Take 1,000 mg by mouth 3 (three) times daily as needed for mild pain.   Yes [provider]  albuterol (VENTOLIN HFA) 108 (90 Base) MCG/ACT inhaler Inhale 2 puffs into the lungs every 6 (six) hours as needed for wheezing or shortness of breath.   Yes [provider]  aspirin 81 MG chewable tablet Chew 81 mg by mouth daily.   Yes [provider]  Benzocaine 20 % OINT Place 1 application rectally 5 (five) times daily as needed (hemmorhoids/anal fissure).   Yes [provider]  bisacodyl (DULCOLAX) 5 MG EC tablet Take 5 mg by mouth daily.   Yes [provider]  ciprofloxacin (CIPRO) 250 MG tablet Take 250 mg by mouth daily.   Yes [provider]  Dulaglutide (TRULICITY) 3 AJ/2.8NO SOPN Inject 3 mg into the skin once a week.   Yes [provider]  Evolocumab with Infusor ([place]) 420 MG/3.5ML  SOCT Inject 420 mg into the skin every 30 (thirty) days.   Yes [provider]  famotidine (PEPCID) 40 MG tablet Take 40 mg by mouth at bedtime.   Yes [provider]  furosemide (LASIX) 80 MG tablet Take 80-120 mg by mouth See admin instructions. Take 1.5 tablets (120 mg) by mouth in the morning & take 1 tablet (80 mg) by mouth at noon for blood pressure/fluid/potassium   Yes [provider]  metoCLOPramide (REGLAN) 5 MG tablet Take 5 mg by mouth 2 (two) times daily as needed for nausea or vomiting.   Yes [provider]  multivitamin (RENA-VIT) TABS tablet Take 1 tablet by mouth daily.   Yes [provider]  Nitroglycerin (RECTIV) 0.4 % OINT Place 1 application rectally every 12 (twelve) hours as needed (hemmorhoids/anal fissure).   Yes [provider]  Nutritional Supplements (FEEDING SUPPLEMENT, NEPRO CARB STEADY,) LIQD Take 240 mLs by mouth daily.   Yes [provider]  polyethylene glycol (MIRALAX / GLYCOLAX) 17 g packet Take 17 g by mouth daily.   Yes [provider]  rosuvastatin (CRESTOR) 20 MG tablet Take 20 mg by mouth daily.    Yes [provider]  senna-docusate (SENOKOT-S) 8.6-50 MG tablet Take 2 tablets by mouth in the morning and at bedtime.   Yes [provider]  umeclidinium-vilanterol (ANORO ELLIPTA) 62.5-25 MCG/INH AEPB Inhale 1 puff into the lungs daily.   Yes [provider]  Vitamin D, Ergocalciferol, (DRISDOL) 1.25 MG (50000 UNIT) CAPS capsule Take 50,000 Units by mouth every 30 (thirty) days.   Yes [provider]  mupirocin ointment (BACTROBAN) 2 % Apply 1 application topically 2 (two) times daily. Patient not taking: Reported on 10/26/2020 09/14/20   Trula Slade, DPM   Current Facility-Administered Medications  Medication Dose Route Frequency Provider Last Rate Last Admin   0.9 %  sodium chloride infusion (Manually program via Guardrails IV Fluids)   Intravenous  Once Hessie Dibble T, CRNA       0.9 %  sodium chloride infusion  100 mL Intravenous PRN Gean Quint, MD       0.9 %  sodium chloride infusion  100 mL Intravenous PRN Gean Quint, MD       acetaminophen (TYLENOL) tablet 1,000 mg  1,000 mg Oral TID PRN Wynetta Fines T, MD       albuterol (PROVENTIL) (2.5 MG/3ML) 0.083% nebulizer solution 2.5 mg  2.5 mg Inhalation Q6H PRN Wynetta Fines T, MD       alteplase (CATHFLO ACTIVASE) injection 2 mg  2 mg Intracatheter Once PRN Gean Quint, MD       aspirin chewable tablet 81 mg  81 mg Oral Daily Wynetta Fines T, MD   81 mg at 10/29/20 0837   bisacodyl (DULCOLAX) EC tablet 5 mg  5 mg Oral Daily Wynetta Fines T, MD   5 mg at 10/29/20 9024   Chlorhexidine Gluconate Cloth 2 % PADS 6 each  6 each Topical Q0600 Gean Quint, MD   6 each at 10/29/20 0625   ciprofloxacin (CIPRO) tablet 250 mg  250 mg Oral Daily Wynetta Fines T, MD   250 mg at 10/29/20 0973   clopidogrel (PLAVIX) tablet 75 mg  75 mg Oral Daily Gabriel Earing, PA-C   75 mg at 10/29/20 5329   famotidine (PEPCID) tablet 20 mg  20 mg Oral QHS Hosie Poisson, MD       feeding supplement (NEPRO CARB STEADY) liquid 240 mL  240 mL Oral Daily Wynetta Fines T, MD   237 mL at 10/29/20 1223   furosemide (LASIX) tablet 120 mg  120 mg Oral Q0600 Wynetta Fines T, MD   120 mg at 10/29/20 9242   furosemide (LASIX) tablet 80 mg  80 mg Oral Q1200 Lequita Halt, MD       heparin injection 1,000 Units  1,000 Units Dialysis PRN Gean Quint, MD       heparin injection 5,000 Units  5,000 Units Subcutaneous Q12H Wynetta Fines T, MD   5,000 Units at 10/29/20 0840   HYDROcodone-acetaminophen (NORCO/VICODIN) 5-325 MG per tablet 1-2 tablet  1-2 tablet Oral Q6H PRN Gabriel Earing, PA-C   2 tablet at 10/28/20 2151   HYDROmorphone (DILAUDID) injection 0.5 mg  0.5 mg Intravenous Q2H PRN Rhyne, Samantha J, PA-C       insulin aspart (novoLOG) injection 0-6 Units  0-6 Units Subcutaneous TID WC Wynetta Fines T, MD   3 Units at 10/29/20 1222    lidocaine (PF) (XYLOCAINE) 1 % injection 5 mL  5 mL Intradermal PRN Gean Quint, MD       lidocaine-prilocaine (EMLA) cream 1 application  1 application Topical PRN Gean Quint,  MD       metoCLOPramide (REGLAN) tablet 5 mg  5 mg Oral BID PRN Wynetta Fines T, MD   5 mg at 10/29/20 0804   multivitamin (RENA-VIT) tablet 1 tablet  1 tablet Oral Daily Lequita Halt, MD   1 tablet at 10/29/20 5366   nicotine (NICODERM CQ - dosed in mg/24 hours) patch 14 mg  14 mg Transdermal Daily Hosie Poisson, MD   14 mg at 10/29/20 1222   pentafluoroprop-tetrafluoroeth (GEBAUERS) aerosol 1 application  1 application Topical PRN Gean Quint, MD       polyethylene glycol (MIRALAX / GLYCOLAX) packet 17 g  17 g Oral Daily Wynetta Fines T, MD   17 g at 10/28/20 2152   rosuvastatin (CRESTOR) tablet 20 mg  20 mg Oral Daily Wynetta Fines T, MD   20 mg at 10/28/20 2150   senna-docusate (Senokot-S) tablet 2 tablet  2 tablet Oral BID Lequita Halt, MD   2 tablet at 10/29/20 4403   umeclidinium-vilanterol (ANORO ELLIPTA) 62.5-25 MCG/INH 1 puff  1 puff Inhalation Daily Lequita Halt, MD       [START ON 11/26/2020] Vitamin D (Ergocalciferol) (DRISDOL) capsule 50,000 Units  50,000 Units Oral Q30 days Lequita Halt, MD       Labs: Basic Metabolic Panel: Recent Labs  Lab 10/28/20 0730 10/28/20 1551 10/29/20 0530  NA 134* 136 136  K 5.5* 3.6 4.3  CL 94*  --  95*  CO2  --   --  26  GLUCOSE 173*  --  222*  BUN 65*  --  51*  CREATININE 7.30*  --  8.36*  CALCIUM  --   --  8.5*    CBC: Recent Labs  Lab 10/28/20 1551 10/29/20 0530 10/29/20 0848  WBC  --  12.3* 13.3*  NEUTROABS  --  9.9*  --   HGB 11.2* 8.7* 9.1*  HCT 33.0* 28.2* 30.1*  MCV  --  76.8* 78.4*  PLT  --  177 173   CBG: Recent Labs  Lab 10/28/20 1800 10/28/20 2047 10/29/20 0553 10/29/20 1153  GLUCAP 139* 156* 186* 264*   Iron Studies:  Recent Labs    10/28/20 2030  IRON 65  TIBC 330   Studies/Results: PERIPHERAL VASCULAR  CATHETERIZATION  Result Date: 10/28/2020 Images from the original result were not included. 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 Donzetta Matters, MD Procedure Performed: 1.  Ultrasound-guided cannulation left common femoral artery in an antegrade fashion 2.  Left lower extremity angiography 3.  Ultrasound-guided cannulation left posterior tibial artery 4.  Laser arthrectomy left SFA, popliteal artery, tibioperoneal trunk and posterior tibial artery 5.  Balloon angioplasty of left SFA with 5 mm balloon 6.  Stent of left popliteal artery with 5 mm bio mimics 7.  Plain balloon angioplasty left peroneal and posterior tibial arteries with 3 mm balloon 8.  Moderate sedation with fentanyl and Versed for 171 minutes Indications: 65 year old male with right lower extremity amputation and history of an aortobifemoral bypass graft remotely.  He now has left second toe ulceration he is indicated for angiography with possible intervention. Findings: Left common femoral artery did have significant scar tissue in the left common femoral artery was heavily diseased.  After cannulated with micropuncture needle I placed contrast it appeared that the SFA had stenosis proximally and the flow was  directed into the profunda.  I was able to direct a wire into the SFA and then place sheath.  The SFA was severely diseased multiple areas of 90% stenosis and/or occlusions.  It did frankly occlude at the level of the knee and the runoff was via the peroneal artery.  I was able to get through the all of the stenoses and occlusions initially perform laser arthrectomy down the tibioperoneal trunk I had runoff via the peroneal artery.  I cannulated the posterior tibial artery retrograde in an effort to get retrograde around the SFA sheath to treat the proximal  disease.  Ultimately I did have better flow in the posterior tibial artery peroneal artery appeared to occlude after balloon angioplasty of the posterior tibial artery.  The SFA was heavily diseased.  Unfortunately had dissection from both antegrade and retrograde area up into the common femoral artery.  With this I have elected for left lower extremity thrombectomy.  I did place a stent to the left popliteal artery which resolved the previously occluded area to less than 20% stenosis.  Dominant runoff is via the posterior tibial artery at completion. Patient is going emergently to the operating room for left lower extremity thrombectomy, left lower extremity angiography and possible bypass.  Procedure:  The patient was identified in the holding area and taken to room 8.  The patient was then placed supine on the table and prepped and draped in the usual sterile fashion.  A time out was called.  Ultrasound was used to evaluate the left common femoral artery.  This was heavily diseased.  There is anesthetized 1% lidocaine cannulated into antegrade fashion with micropuncture needle followed by wire.  Initially the wire was going down the profunda.  Performed angiography which demonstrated stenosis at the proximal SFA.  I was able to direct a wire into the SFA and placed a micropuncture sheath and a Bentson wire followed by 5 [place] sheath.  I initially cannulated with ultrasound guidance and an image saved per record.  I perform left lower extremity angiography with the above findings.  I then placed a Glidewire advantage down to the level of popliteal artery where the occluded placed a long 6 French sheath did have to dilate with a 7 [place] dilator.  Patient was fully heparinized at this time.  I did get antegrade through the occluded popliteal artery using Glidewire advantage quick cross catheter.  I have confirmed intraluminal access in the peroneal artery.  I then performed laser arthrectomy of the SFA popliteal  tibioperoneal trunk and the peroneal artery.  I then performed primary balloon angioplasty.  Unfortunately proximally where the sheath was in the SFA there was severe disease appear to be dissected.  Did ultimately stent the left popliteal artery to 0% residual stenosis were previously was occluded.  I also ballooned to the peroneal tibioperoneal trunk there was good resolution of stenosis there as well.  Proximally where there was significant stenoses and occlusions in the SFA it did appear dissected.  I elected that I would need posterior tibial access to get up retrograde to attempt to treat the proximal SFA around the common femoral sheath.  I cannulated the left posterior tibial artery with micropuncture needle followed by wire and sheath using ultrasound guidance.  Image saved per record.  I was able to drive a V 18 wire directly up to the tibioperoneal trunk bifurcation and then crossed over with CXI cath and confirmed intraluminal access.  I think at this wire all the  way into the SFA.  I performed laser arthrectomy of the posterior tibial artery from the tibioperoneal trunk and then balloon angioplasty with 3 mm balloon.  Completion demonstrated that there was no residual stenosis in the posterior tibial artery however the peroneal artery did not fill antegrade at this time.  I elected that I would leave the posterior tibial artery as the main runoff.  I then went into the SFA and perform retrograde angiography.  Ultimately I had snared the wire in a retrograde fashion to do this performing laser arthrectomy and balloon angioplasty.  I attempted now to redirect a wire around the sheath but it was in a dissection plane via angiography.  I elected that there was no way that I would be able to get out of the dissection plane and the patient would need surgical intervention.  I did have the balloon angioplasty of the posterior tibial artery again to 0% residual stenosis.  I then remove the sheath from the  posterior tibial artery and pressure was held.  I exchanged for short 6 French sheath in the left groin and this will be removed in surgery.  He did tolerate the procedure well with complication of dissection of the left common femoral artery and will require operative intervention. Contrast: 110cc Brandon C. Donzetta Matters, MD [facility] Office: [phone] Pager: [phone]  DG CHEST PORT 1 VIEW  Result Date: 10/29/2020 CLINICAL DATA:  Shortness of breath EXAM: PORTABLE CHEST 1 VIEW COMPARISON:  09/23/2020 FINDINGS: 18 degrees dextroconvex thoracic scoliosis as measured between T4 and T8. Atherosclerotic calcification of the aortic arch. Mild enlargement of the cardiopericardial silhouette, without edema. The lungs appear clear. No blunting of the costophrenic angles. IMPRESSION: 1. Mild enlargement of the cardiopericardial silhouette, without edema. The lungs appear clear. 2.  Aortic Atherosclerosis (ICD10-I70.0). 3. Dextroconvex thoracic scoliosis. Electronically Signed   By: Van Clines M.D.   On: 10/29/2020 14:31   HYBRID OR IMAGING ([facility] ONLY)  Result Date: 10/28/2020 There is no interpretation for this exam.  This order is for images obtained during a surgical procedure.  Please See "Surgeries" Tab for more information regarding the procedure.    ROS: All others negative except those listed in HPI.  Physical Exam: Vitals:   10/29/20 1430 10/29/20 1500 10/29/20 1520 10/29/20 1530  BP: (!) 93/43 (!) 96/42 (!) 89/40 (!) 92/34  Pulse: 78 78    Resp:      Temp:      TempSrc:      SpO2:      Weight:      Height:         General: well appearing male in NAD Head: NCAT sclera not icteric MMM Neck: Supple. No lymphadenopathy Lungs: CTA bilaterally. No wheeze, rales or rhonchi. Breathing is unlabored on 2L O2 via [place] Heart: RRR. No murmur, rubs or gallops.  Abdomen: soft, nontender, +BS, no guarding, no rebound tenderness Lower extremities:trace edema on R, L  BKA - no edema Neuro: AAOx3. Moves all extremities spontaneously. Psych:  Responds to questions appropriately with a normal affect. Dialysis Access:R BKA +b/t  Dialysis Orders:  [facility] [place]  3hrs 47min, BFR 400, DFR AF 1.5,  EDW 57.5kg, 2K/ 2Ca  Access: LU AVF  Heparin 2200 unit bolus Mircera 30 mcg q4wks - last 10/27/20 Venofer 100mg  qHD x10 (1 completed) Hectorol 55mcg IV qHD    Assessment/Plan:  PVD w/non healing R toe ulceration - recent angiography on 6/14 revealed popliteal and peroneal occlusion.  Angiography 6/30 with emergent L common femoral, SFA and profunda femoral endarterectomy with bovine pericardial patch angioplasty.  Doing well today. Per [facility].  ESRD -  on HD MWF.  HD today per regular schedule.   Hypertension/volume  - BP soft today without most of home meds.   Typically on amlodipine 10mg  qd, clonidine 0.3mg  weekly patch, hydralazine 100mg  TID, Toprol 100mg  qd, furosemide 120mg  qAm, 80mg  qPM  Anemia of CKD - Hgb 9.1.  ESA recently dosed. Monitor.   Secondary Hyperparathyroidism -  Ca ok. Check phos. Continue VDRA and binders.  Nutrition - Renal diet w/fluid restrictions.  COPD - on 2L O2 at home.  DMT2 Combined CHF with EF 25-30%  Jen Mow, PA-C [facility] 10/29/2020, 3:48 PM

## 2020-10-29 NOTE — Progress Notes (Signed)
Pt arrived back to 4E from hemodialysis. BP was 86/60. Notified MD.

## 2020-10-29 NOTE — Progress Notes (Signed)
Hemodialysis- Treatment completed without issue. BP soft throughout treatment. Patient symptomatic x1 relieved with saline bolus and uf off. BP remained in 90s throughout treatment so no uf was removed. Post rinseback bp 109/56. Patient is currently without complaints. Tells me he hasnt "had an appetite at all lately".

## 2020-10-29 NOTE — Progress Notes (Signed)
Pharmacy note: renal adjustment  65 yo male with ESRD on HD on pepcid 40mg  po qhs   Plan -Will adjust to 20mg  po qhs as inpatient -Consider decreasing pepcid to 20mg  daily at discharge  Thank you, Hildred Laser, PharmD Clinical Pharmacist **Pharmacist phone directory can now be found on [email or web address] ([email or web address]).  Listed under [place].

## 2020-10-30 LAB — HEMOGLOBIN A1C
Hgb A1c MFr Bld: 9.5 % — ABNORMAL HIGH (ref 4.8–5.6)
Mean Plasma Glucose: 226 mg/dL

## 2020-10-30 LAB — GLUCOSE, CAPILLARY
Glucose-Capillary: 220 mg/dL — ABNORMAL HIGH (ref 70–99)
Glucose-Capillary: 230 mg/dL — ABNORMAL HIGH (ref 70–99)
Glucose-Capillary: 183 mg/dL — ABNORMAL HIGH (ref 70–99)

## 2020-10-30 MED ORDER — CLOPIDOGREL BISULFATE 75 MG PO TABS: 75.0000 mg | ORAL_TABLET | Freq: Every day | ORAL | 1 refills | Status: AC

## 2020-10-30 NOTE — Progress Notes (Signed)
[facility] Progress Note   Subjective:   Patient seen and examined at bedside.  Reports mild pain in L foot.  Otherwise doing ok. Denies CP, SOB, n/v/d and abdominal pain.  Appetite is poor. Unable to UF yesterday due to hypotension.   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   [facility] Weights   10/28/20 2012 10/29/20 1359 10/29/20 1753  Weight: 56.2 kg 59.6 kg 59.6 kg    Intake/Output Summary (Last 24 hours) at 10/30/2020 1132 Last data filed at 10/29/2020 1753 Gross per 24 hour  Intake --  Output 0 ml  Net 0 ml    Additional Objective Labs: Basic Metabolic Panel: Recent Labs  Lab 10/28/20 0730 10/28/20 1551 10/29/20 0530  NA 134* 136 136  K 5.5* 3.6 4.3  CL 94*  --  95*  CO2  --   --  26  GLUCOSE 173*  --  222*  BUN 65*  --  51*  CREATININE 7.30*  --  8.36*  CALCIUM  --   --  8.5*    CBC: Recent Labs  Lab 10/28/20 1551 10/29/20 0530 10/29/20 0848  WBC  --  12.3* 13.3*  NEUTROABS  --  9.9*  --   HGB 11.2* 8.7* 9.1*  HCT 33.0* 28.2* 30.1*  MCV  --  76.8* 78.4*  PLT  --  177 173    CBG: Recent Labs  Lab 10/29/20 0553 10/29/20 1153 10/29/20 1846 10/29/20 2118 10/30/20 0619  GLUCAP 186* 264* 199* 251* 220*   Iron Studies:  Recent Labs    10/28/20 2030  IRON 65  TIBC 330   Lab Results  Component Value Date   INR 1.0 10/15/2008   INR 0.9 10/15/2008   Studies/Results: DG CHEST PORT 1 VIEW  Result Date: 10/29/2020 CLINICAL DATA:  Shortness of breath EXAM: PORTABLE CHEST 1  VIEW COMPARISON:  09/23/2020 FINDINGS: 18 degrees dextroconvex thoracic scoliosis as measured between T4 and T8. Atherosclerotic calcification of the aortic arch. Mild enlargement of the cardiopericardial silhouette, without edema. The lungs appear clear. No blunting of the costophrenic angles. IMPRESSION: 1. Mild enlargement of the cardiopericardial silhouette, without edema. The lungs appear clear. 2.  Aortic Atherosclerosis (ICD10-I70.0). 3. Dextroconvex thoracic scoliosis. Electronically Signed   By: Van Clines M.D.   On: 10/29/2020 14:31   HYBRID OR IMAGING ([facility] ONLY)  Result Date: 10/28/2020 There is no interpretation for this exam.  This order is for images obtained during a surgical procedure.  Please See "Surgeries" Tab for more information regarding the procedure.    Medications:   sodium chloride   Intravenous Once   aspirin  81 mg Oral Daily   bisacodyl  5 mg Oral Daily   Chlorhexidine Gluconate Cloth  6 each Topical Q0600   ciprofloxacin  250 mg Oral Daily   clopidogrel  75 mg Oral Daily   famotidine  20 mg Oral  QHS   feeding supplement (NEPRO CARB STEADY)  240 mL Oral Daily   furosemide  120 mg Oral Q0600   furosemide  80 mg Oral Q1200   heparin  5,000 Units Subcutaneous Q12H   insulin aspart  0-6 Units Subcutaneous TID WC   multivitamin  1 tablet Oral Daily   nicotine  14 mg Transdermal Daily   polyethylene glycol  17 g Oral Daily   rosuvastatin  20 mg Oral Daily   senna-docusate  2 tablet Oral BID   umeclidinium-vilanterol  1 puff Inhalation Daily   [START ON 11/26/2020] Vitamin D (Ergocalciferol)  50,000 Units Oral Q30 days    Dialysis Orders: [facility]WF [facility]  3hrs 80min, BFR 400, DFR AF 1.5,  EDW 57.5kg, 2K/ 2Ca   Access: LU AVF  Heparin 2200 unit bolus Mircera 30 mcg q4wks - last 10/27/20 Venofer 100mg  qHD x10 (1 completed) Hectorol 28mcg IV qHD     Assessment/Plan:  PVD w/non healing R toe ulceration - recent angiography on 6/14 revealed popliteal and  peroneal occlusion. Angiography 6/30 with emergent L common femoral, SFA and profunda femoral endarterectomy with bovine pericardial patch angioplasty. Per [facility].  ESRD -  on HD MWF.  HD yesterday per regular schedule using LU AVG.  Successful declot of AVG as outpatient on 6/29.     Hypertension/volume  - BP soft today.  Off all meds.  Updated outpatient med list.     Anemia of CKD - Hgb 9.1.  ESA recently dosed. Monitor.  Secondary Hyperparathyroidism -  Ca ok. Check phos. Continue VDRA and binders.  Nutrition - Renal diet w/fluid restrictions. COPD - on 2L O2 at home. 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   Jen Mow, PA-C [facility] 10/30/2020,11:32 AM  LOS: 2 days

## 2020-10-30 NOTE — Discharge Summary (Signed)
Physician Discharge Summary  Gary [name] GGY:[id number] DOB: Feb 01, 1956 DOA: 10/28/2020  PCP: Janifer Adie, MD  Admit date: 10/28/2020 Discharge date: 10/30/2020  Admitted From: Home Disposition:  hOME   Recommendations for Outpatient Follow-up:  Follow up with PCP in 1-2 weeks Please obtain BMP/CBC in one week Please follow up WITH Vascular surgery as recommended.   Discharge Condition: stable.  CODE STATUS:full code.  Diet recommendation: Heart Healthy   Brief/Interim Summary: LEWI DROST is a 65 y.o. male with medical history significant of PVD status post right BKA, chronic ischemia left lower extremity, chronic systolic CHF OJJK<09%, ESRD on HD MWF, HTN, HLD, COPD, IIDM, came with vascular intervention.   Patient underwent left common femoral, SFA and profundofemoral endarterectomy below-knee and stenting of SFA under general anesthesia.    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 Therapy evaluations  done.  Continue with aspirin, plavix and statin.     Chronic systolic CHF Last echo showed LVEF < 30%. Continue with lasix, strict intake and output, daily weights.     COPD/ chronic respiratory failure with hypoxia on 2 lit of [place] oxygen at home. No wheezing heard. Still smoking cigarettes. Added nicotine patch.     Type 2 dm; Non insulin dependent. Hemoglobin A1c is pending. Continue with SSI.     Hypertension: BP parameters  are borderline . Pt asymptomatic.      ESRD on HD; Nephrology on board.     Hyperlipidemia: Continue with statin.   Anemia of chronic disease/ anemia of blood loss from the procedure. - baseline hemoglobin is around 11, dropped to 9.1. - transfuse to keep hemoglobin greater than 7.       Discharge Instructions  Discharge Instructions     Diet - low sodium heart healthy   Complete by: As directed    Increase activity slowly   Complete by: As directed    No wound care   Complete by: As directed       Allergies as of 10/30/2020       Reactions   Oxycodone-acetaminophen Rash, Other (See Comments)   Macular papular rash after two days on Oxy/APAP resolved with d/c of drug.        Medication List     STOP taking these medications    mupirocin ointment 2 % Commonly known as: BACTROBAN       TAKE these medications    acetaminophen 500 MG tablet Commonly known as: TYLENOL Take 1,000 mg by mouth 3 (three) times daily as needed for mild pain.   albuterol 108 (90 Base) MCG/ACT inhaler Commonly known as: VENTOLIN HFA Inhale 2 puffs into the lungs every 6 (six) hours as needed for wheezing or shortness of breath.   Anoro Ellipta 62.5-25 MCG/INH Aepb Generic drug: umeclidinium-vilanterol Inhale 1 puff into the lungs daily.   aspirin 81 MG chewable tablet Chew 81 mg by mouth daily.   Benzocaine 20 % Oint Place 1 application rectally 5 (five) times daily  as needed (hemmorhoids/anal fissure).   bisacodyl 5 MG EC tablet Commonly known as: DULCOLAX Take 5 mg by mouth daily.   ciprofloxacin 250 MG tablet Commonly known as: CIPRO Take 250 mg by mouth daily.   clopidogrel 75 MG tablet Commonly known as: PLAVIX Take 1 tablet (75 mg total) by mouth daily. Start taking on: October 31, 2020   famotidine 40 MG tablet Commonly known as: PEPCID Take 40 mg by mouth at bedtime.   feeding supplement (NEPRO CARB STEADY) Liqd Take 240 mLs by mouth daily.   furosemide  80 MG tablet Commonly known as: LASIX Take 80-120 mg by mouth See admin instructions. Take 1.5 tablets (120 mg) by mouth in the morning & take 1 tablet (80 mg) by mouth at noon for blood pressure/fluid/potassium   metoCLOPramide 5 MG tablet Commonly known as: REGLAN Take 5 mg by mouth 2 (two) times daily as needed for nausea or vomiting.   multivitamin Tabs tablet Take 1 tablet by mouth daily.   polyethylene glycol 17 g packet Commonly known as: MIRALAX / GLYCOLAX Take 17 g by mouth daily.   Rectiv 0.4 % Oint Generic drug: Nitroglycerin Place 1 application rectally every 12 (twelve) hours as needed (hemmorhoids/anal fissure).   Repatha Pushtronex System 420 MG/3.5ML Soct Generic drug: Evolocumab with Infusor Inject 420 mg into the skin every 30 (thirty) days.   rosuvastatin 20 MG tablet Commonly known as: CRESTOR Take 20 mg by mouth daily.   senna-docusate 8.6-50 MG tablet Commonly known as: Senokot-S Take 2 tablets by mouth in the morning and at bedtime.   Trulicity 3 JA/2.5KN Sopn Generic drug: Dulaglutide Inject 3 mg into the skin once a week.   Vitamin D (Ergocalciferol) 1.25 MG (50000 UNIT) Caps capsule Commonly known as: DRISDOL Take 50,000 Units by mouth every 30 (thirty) days.        Follow-up Information     [facility] Follow up.   Why: 2-3 weeks. The office will call the patient with an appointment Contact information: [place] [phone]               Allergies  Allergen Reactions   Oxycodone-Acetaminophen Rash and Other (See Comments)    Macular papular rash after two days on Oxy/APAP resolved with d/c of drug.    Consultations: Vascular surgery.    Procedures/Studies: PERIPHERAL VASCULAR CATHETERIZATION  Result Date: 10/28/2020 Images from the original result were not included. Patient name: Gary [name] MRN: [id number] DOB: 1955/06/22 Sex: male 10/28/2020 Pre-operative  Diagnosis: Chronic left lower extremity limb threatening ischemia with toe ulceration Post-operative diagnosis:  Same Surgeon:  Erlene Quan C. Donzetta Matters, MD Procedure Performed: 1.  Ultrasound-guided cannulation left common femoral artery in an antegrade fashion 2.  Left lower extremity angiography 3.  Ultrasound-guided cannulation left posterior tibial artery 4.  Laser arthrectomy left SFA, popliteal artery, tibioperoneal trunk and posterior tibial artery 5.  Balloon angioplasty of left SFA with 5 mm balloon 6.  Stent of left popliteal artery with 5 mm bio mimics 7.  Plain balloon angioplasty left peroneal and posterior tibial arteries with 3 mm balloon 8.  Moderate sedation with fentanyl and Versed for 171 minutes Indications: 65 year old male with right lower extremity amputation and history of an aortobifemoral bypass graft remotely.  He now has left second toe ulceration he is indicated for angiography with possible intervention. Findings: Left common femoral artery did have significant scar tissue in the left common femoral artery was heavily diseased.  After cannulated with micropuncture needle I placed contrast it appeared that the SFA had stenosis proximally and the flow was directed into the profunda.  I was able to direct a wire into the SFA and then place sheath.  The SFA was severely diseased multiple areas of 90% stenosis and/or occlusions.  It did frankly occlude at the level of the knee and the runoff was via the peroneal artery.  I was able to get through the all of the stenoses and occlusions initially perform laser arthrectomy down the tibioperoneal trunk I had runoff via the peroneal artery.  I cannulated the posterior tibial artery retrograde in an effort to get retrograde around the SFA sheath to treat the proximal disease.  Ultimately I did have better flow in the posterior tibial artery peroneal artery appeared to occlude after balloon angioplasty of the posterior tibial artery.  The SFA was heavily  diseased.  Unfortunately had dissection from both antegrade and retrograde area up into the common femoral artery.  With this I have elected for left lower extremity thrombectomy.  I did place a stent to the left popliteal artery which resolved the previously occluded area to less than 20% stenosis.  Dominant runoff is via the posterior tibial artery at completion. Patient is going emergently to the operating room for left lower extremity thrombectomy, left lower extremity angiography and possible bypass.  Procedure:  The patient was identified in the holding area and taken to room 8.  The patient was then placed supine on the table and prepped and draped in the usual sterile fashion.  A time out was called.  Ultrasound was used to evaluate the left common femoral artery.  This was heavily diseased.  There is anesthetized 1% lidocaine cannulated into antegrade fashion with micropuncture needle followed by wire.  Initially the wire was going down the profunda.  Performed angiography which demonstrated stenosis at the proximal SFA.  I was able to direct a wire into the SFA and placed a micropuncture sheath and a Bentson wire followed by 5 [place] sheath.  I initially cannulated with ultrasound guidance and an image saved per record.  I perform left lower extremity angiography with the above findings.  I then placed a Glidewire advantage down to the level of popliteal artery where the occluded placed a long 6 French sheath did have to dilate with a 7 [place] dilator.  Patient was fully heparinized at this time.  I did get antegrade through the occluded popliteal artery using Glidewire advantage quick cross catheter.  I have confirmed intraluminal access in the peroneal artery.  I then performed laser arthrectomy of the SFA popliteal tibioperoneal trunk and the peroneal artery.  I then performed primary balloon angioplasty.  Unfortunately proximally where the sheath was in the SFA there was severe disease appear to be  dissected.  Did ultimately stent the left popliteal artery to 0% residual stenosis were previously was occluded.  I also ballooned to the peroneal tibioperoneal trunk there was good resolution of stenosis there as well.  Proximally where there was significant stenoses and occlusions in the SFA it did appear dissected.  I elected that I would need posterior tibial access to get up retrograde to attempt to treat the proximal SFA around the common femoral sheath.  I cannulated the left posterior tibial artery with micropuncture needle followed by wire and sheath using ultrasound guidance.  Image saved per record.  I was able to drive a V 18 wire directly up to the tibioperoneal trunk  bifurcation and then crossed over with CXI cath and confirmed intraluminal access.  I think at this wire all the way into the SFA.  I performed laser arthrectomy of the posterior tibial artery from the tibioperoneal trunk and then balloon angioplasty with 3 mm balloon.  Completion demonstrated that there was no residual stenosis in the posterior tibial artery however the peroneal artery did not fill antegrade at this time.  I elected that I would leave the posterior tibial artery as the main runoff.  I then went into the SFA and perform retrograde angiography.  Ultimately I had snared the wire in a retrograde fashion to do this performing laser arthrectomy and balloon angioplasty.  I attempted now to redirect a wire around the sheath but it was in a dissection plane via angiography.  I elected that there was no way that I would be able to get out of the dissection plane and the patient would need surgical intervention.  I did have the balloon angioplasty of the posterior tibial artery again to 0% residual stenosis.  I then remove the sheath from the posterior tibial artery and pressure was held.  I exchanged for short 6 French sheath in the left groin and this will be removed in surgery.  He did tolerate the procedure well with  complication of dissection of the left common femoral artery and will require operative intervention. Contrast: 110cc Brandon C. Donzetta Matters, MD [facility] [place] Office: [phone] Pager: [phone]  PERIPHERAL VASCULAR CATHETERIZATION  Result Date: 10/12/2020 Images from the original result were not included. Patient name: Gary [name] MRN: [id number] DOB: June 24, 1955 Sex: male 10/12/2020 Pre-operative Diagnosis: Left second toe ulcer Post-operative diagnosis:  Same Surgeon:  Annamarie Major Procedure Performed:  1.  Ultrasound-guided access, right femoral artery  2.  Abdominal aortogram  3.  First order catheterization  4.  Left lower extremity runoff  5.  Conscious sedation, 54 minutes  6.  Closure device ([place])  Indications: This is a 65 year old gentleman with history of aortobifemoral bypass graft.  He has an issue with his left second toe.  He comes in today for angiographic evaluation. Procedure:  The patient was identified in the holding area and taken to room 8.  The patient was then placed supine on the table and prepped and draped in the usual sterile fashion.  A time out was called.  Conscious sedation was administered with the use of IV fentanyl and Versed under continuous physician and nurse monitoring.  Heart rate, blood pressure, and oxygen saturation were continuously monitored.  Total sedation time was 54 minutes.  Ultrasound was used to evaluate the right common femoral artery.  It was patent .  A digital ultrasound image was acquired.  A micropuncture needle was used to access the right common femoral artery under ultrasound guidance.  An 018 wire was advanced without resistance and a micropuncture sheath was placed.  The 018 wire was removed and a benson wire was placed.  The micropuncture sheath was exchanged for a 5 french sheath.  An omniflush catheter was advanced over the wire to the level of L-1.  An abdominal angiogram was obtained.  Next, using the omniflush  catheter and a benson wire, the aortic bifurcation was crossed and the catheter was placed into theright common iliac artery and right runoff was obtained.  Findings:  Aortogram: No significant renal artery stenosis.  An aortobifemoral bypass graft is visualized coming off of the infrarenal aorta and a end-to-side configuration.  Both  limbs of the graft are widely patent.  Right Lower Extremity: Not evaluated  Left Lower Extremity: The left common femoral and profundofemoral artery are patent throughout their course.  There is mild to moderate diffuse long segment disease within the superficial femoral artery.  The popliteal artery is occluded above the knee with reconstitution of the peroneal artery which is the single-vessel runoff.  There is collateralization and reconstitution of the posterior tibial artery in the lower leg however this does not fully perfuse the foot as there is small vessel disease. Intervention: None Impression:  #1  No significant aortoiliac occlusive disease  #2  Left popliteal occlusion with reconstitution of the peroneal artery which is his single-vessel runoff  #3  Patient will need to be evaluated for a left femoral peroneal bypass.  Theotis Burrow, M.D., [facility] [place] Office: [phone] Pager:  [phone]   DG CHEST PORT 1 VIEW  Result Date: 10/29/2020 CLINICAL DATA:  Shortness of breath EXAM: PORTABLE CHEST 1 VIEW COMPARISON:  09/23/2020 FINDINGS: 18 degrees dextroconvex thoracic scoliosis as measured between T4 and T8. Atherosclerotic calcification of the aortic arch. Mild enlargement of the cardiopericardial silhouette, without edema. The lungs appear clear. No blunting of the costophrenic angles. IMPRESSION: 1. Mild enlargement of the cardiopericardial silhouette, without edema. The lungs appear clear. 2.  Aortic Atherosclerosis (ICD10-I70.0). 3. Dextroconvex thoracic scoliosis. Electronically Signed   By: Van Clines M.D.   On:  10/29/2020 14:31   MR BRAIN/IAC W WO CONTRAST  Result Date: 10/06/2020 CLINICAL DATA:  Hearing loss for several years EXAM: MRI HEAD WITHOUT AND WITH CONTRAST TECHNIQUE: Multiplanar, multiecho pulse sequences of the brain and surrounding structures were obtained without and with intravenous contrast. CONTRAST:  56mL MULTIHANCE GADOBENATE DIMEGLUMINE 529 MG/ML IV SOLN COMPARISON:  Head CT 12/05/2018 FINDINGS: Brain: Negative for retrocochlear mass or abnormal labyrinthine signal. CSF intensity space with few and fine septations lateral to the left cerebellum with adjacent cerebellar gliosis, 3.4 x 1 cm. Mild chronic small vessel ischemia in the periventricular white matter. Generalized brain atrophy. No recent infarct, hemorrhage, hydrocephalus, or enhancing mass. Vascular: Normal flow voids. Skull and upper cervical spine: Suboccipital scarring. No evidence of bone lesion. Sinuses/Orbits: Bilateral cataract resection. Partial left mastoid opacification. IMPRESSION: 1. Negative for retrocochlear mass. 2. 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 3. Generalized brain atrophy. Electronically Signed   By: Monte Fantasia M.D.   On: 10/06/2020 06:40   VAS [place] ABI WITH/WO TBI  Result Date: 10/05/2020  LOWER EXTREMITY DOPPLER STUDY Patient Name:  KIRUBEL AJA  Date of Exam:   10/05/2020 Medical Rec #: [id number]       Accession #:    [id number] Date of Birth: Jun 14, 1955      Patient Gender: M Patient Age:   64Y Exam Location:  [facility] Procedure:      VAS [place] ABI WITH/WO TBI Referring Phys: [id number] Serafina Mitchell --------------------------------------------------------------------------------  Indications: Discoloration of the left 2nd toe, peripheral artery disease, and              right BKA. High Risk Factors: Hypertension, hyperlipidemia, Diabetes, current smoker. Other Factors: Left AVF.  Comparison Study: Prior exam of 09/03/2019 revealed biphasic waveforms on the                    left. Waveforms are now dampened monophasic. Performing Technologist: Alvia Grove RVT  Examination Guidelines: A complete evaluation includes at minimum, Doppler  waveform signals and systolic blood pressure reading at the level of bilateral brachial, anterior tibial, and posterior tibial arteries, when vessel segments are accessible. Bilateral testing is considered an integral part of a complete examination. Photoelectric Plethysmograph (PPG) waveforms and toe systolic pressure readings are included as required and additional duplex testing as needed. Limited examinations for reoccurring indications may be performed as noted.  ABI Findings: +--------+------------------+-----+--------+--------+ Right   Rt Pressure (mmHg)IndexWaveformComment  +--------+------------------+-----+--------+--------+ [id number]                                     +--------+------------------+-----+--------+--------+ +---------+------------------+-----+-------------------+-------+ Left     Lt Pressure (mmHg)IndexWaveform           Comment +---------+------------------+-----+-------------------+-------+ Brachial                                           AVF     +---------+------------------+-----+-------------------+-------+ PTA      >254              1.90 dampened monophasic        +---------+------------------+-----+-------------------+-------+ DP       70                0.52 dampened monophasic        +---------+------------------+-----+-------------------+-------+ Great Toe18                0.13 Abnormal                   +---------+------------------+-----+-------------------+-------+ +-------+-----------+------------+------------+--------------------+ ABI/TBIToday's ABIToday's TBI Previous ABIPrevious TBI         +-------+-----------+------------+------------+--------------------+ Right  BKA                    BKA                               +-------+-----------+------------+------------+--------------------+ Left   [place]         0.13 2nd toeNC          great toe amputation +-------+-----------+------------+------------+--------------------+   Summary:  Right BKA. Left: Resting left ankle-brachial index indicates noncompressible left lower extremity arteries. The left 2nd toe-brachial index is abnormal.  *See table(s) above for measurements and observations.  Electronically signed by Monica Martinez MD on 10/05/2020 at 2:46:52 PM.    Final    VAS [place] LOWER EXT [place]  Result Date: 10/23/2020 [place] Patient Name:  ITAI BARBIAN [facility]  Date of Exam:   10/22/2020 Medical Rec #: [id number]       Accession #:    [id number] Date of Birth: Feb 17, 1956      Patient Gender: M Patient Age:   64Y Exam Location:  [facility] Procedure:      VAS [place] LOWER EXTREMITY SAPHENOUS VEIN MAPPING Referring Phys: [id number] Serafina Mitchell --------------------------------------------------------------------------------  Indications: Pre-op  Performing Technologist: Alvia Grove RVT  Examination Guidelines: A complete evaluation includes B-mode imaging, spectral Doppler, color Doppler, and power Doppler as needed of all accessible portions of each vessel. Bilateral testing is considered an integral part of a complete examination. Limited examinations for reoccurring indications may be performed as noted. +---------------+-----------+---------------------+----------------+-----------+  Diameter (cm) RT Findings         GSV  LT Diameter (cm)LT Findings      RUGHT                                                                 +---------------+-----------+---------------------+----------------+-----------+      0.47          BKA       Saphenofemoral          0.40                                                  Junction                                    +---------------+-----------+---------------------+----------------+-----------+      0.35                    Proximal thigh      0.29 / 0.32               +---------------+-----------+---------------------+----------------+-----------+      0.27                       Mid thigh            0.21                  +---------------+-----------+---------------------+----------------+-----------+      0.41                     Distal thigh           0.26                  +---------------+-----------+---------------------+----------------+-----------+        -           BKA            Knee               0.27       branching  +---------------+-----------+---------------------+----------------+-----------+        -                        Prox calf            0.24                  +---------------+-----------+---------------------+----------------+-----------+        -                        Mid calf         0.24 / 0.21               +---------------+-----------+---------------------+----------------+-----------+        -                       Distal calf           0.16                  +---------------+-----------+---------------------+----------------+-----------+ +-------------+-----------+---------------+----------------+-----------+  diameter (cm)RT Findings      SSV      LT Diameter (cm)LT Findings +-------------+-----------+---------------+----------------+-----------+       -                 Popliteal fossa      0.39       branching  +-------------+-----------+---------------+----------------+-----------+       -                  Proximal calf   0.43 / 0.40               +-------------+-----------+---------------+----------------+-----------+       -                    Mid calf          0.35                  +-------------+-----------+---------------+----------------+-----------+       -                   Distal calf    0.37 / 0.27    branching   +-------------+-----------+---------------+----------------+-----------+ Summary:  Left: Measurements above.  *See table(s) above for measurements and observations. Diagnosing physician: Servando Snare MD Electronically signed by Servando Snare MD on 10/23/2020 at 1:33:55 PM.    Final    MYOCARDIAL PERFUSION IMAGING  Result Date: 10/19/2020  Nuclear stress EF: 13%.  The left ventricular ejection fraction is severely decreased (<30%).  Defect 1: There is a large defect of moderate severity present in the basal inferoseptal, basal inferior, basal inferolateral, mid inferior, mid inferolateral and apical inferior location.  Defect 2: There is a small defect of moderate severity present in the apex location.  Findings consistent with prior myocardial infarction.  This is a high risk study.  1. Fixed perfusion defect at inferior/inferolateral walls and apex consistent with prior infarct, though there is significant extracardiac radiotracer uptake adjacent to inferior/inferolateral walls that could be causing artifact 2. High risk study due to severe systolic dysfunction (EF 56%).  No ischemia.   OCT, Retina - OU - Both Eyes  Result Date: 10/26/2020 Right Eye Quality was good. Central Foveal Thickness: 236. Progression has no prior data. Findings include normal foveal contour, intraretinal fluid, no SRF, preretinal fibrosis (Focal IRF/cystic changes nasal macula, focal PRF on inferior disc caught on widefield). Left Eye Quality was good. Central Foveal Thickness: 237. Progression has no prior data. Findings include normal foveal contour, no IRF, no SRF (Partial PVD, mild diffuse atrophy). Notes *Images captured and stored on drive Diagnosis / Impression: OD: Focal IRF/cystic changes nasal macula, focal PRF on inferior disc caught on widefield OS: NFP; no IRF/SRF Clinical management: See below Abbreviations: NFP - Normal foveal profile. CME - cystoid macular edema. PED - pigment epithelial detachment. IRF -  intraretinal fluid. SRF - subretinal fluid. EZ - ellipsoid zone. ERM - epiretinal membrane. ORA - outer retinal atrophy. ORT - outer retinal tubulation. SRHM - subretinal hyper-reflective material. IRHM - intraretinal hyper-reflective material   Fluorescein Angiography Optos (Transit OD)  Result Date: 10/26/2020 Right Eye Progression has no prior data. Early phase findings include delayed filling, microaneurysm, vascular perfusion defect, staining (Low fluorescein signal). Mid/Late phase findings include leakage, microaneurysm, vascular perfusion defect, staining (Mild focal leakage from disc and nasal venules). Left Eye Progression has no prior data. Early phase findings include microaneurysm, vascular perfusion defect (Low fluorescein signal). Mid/Late phase findings include microaneurysm, leakage, vascular  perfusion defect (Focal perivascular leakage along IT and SN arcades). Notes **Images stored on drive** Impression: Low fluorescein signal Early PDR OU OD: Mild focal leakage from disc and nasal venules OS: Focal perivascular leakage along IT and SN arcades  HYBRID OR IMAGING ([facility] ONLY)  Result Date: 10/28/2020 There is no interpretation for this exam.  This order is for images obtained during a surgical procedure.  Please See "Surgeries" Tab for more information regarding the procedure.   (Echo, Carotid, EGD, Colonoscopy, ERCP)    Subjective:   Discharge Exam: Vitals:   10/30/20 1049 10/30/20 1558  BP: (!) 97/55 (!) 104/44  Pulse: 70 85  Resp: 18 (!) 9  Temp: 97.6 F (36.4 C) 97.6 F (36.4 C)  SpO2: 99% 92%   Vitals:   10/30/20 0951 10/30/20 1015 10/30/20 1049 10/30/20 1558  BP: (!) 90/40 (!) 90/40 (!) 97/55 (!) 104/44  Pulse: 70 70 70 85  Resp: 14 14 18  (!) 9  Temp: 97.6 F (36.4 C) 97.6 F (36.4 C) 97.6 F (36.4 C) 97.6 F (36.4 C)  TempSrc:   Oral Oral  SpO2:   99% 92%  Weight:      Height:        General: Pt is alert, awake, not in acute distress Cardiovascular:  RRR, S1/S2 +, no rubs, no gallops Respiratory: CTA bilaterally, no wheezing, no rhonchi Abdominal: Soft, NT, ND, bowel sounds + Extremities: no edema, no cyanosis    The results of significant diagnostics from this hospitalization (including imaging, microbiology, ancillary and laboratory) are listed below for reference.     Microbiology: Recent Results (from the past 240 hour(s))  Resp Panel by RT-PCR (Flu A&B, Covid) Nasopharyngeal Swab     Status: None   Collection Time: 10/29/20  8:27 AM   Specimen: Nasopharyngeal Swab; Nasopharyngeal(NP) swabs in vial transport medium  Result Value Ref Range Status   SARS Coronavirus 2 by RT PCR NEGATIVE NEGATIVE Final    Comment: (NOTE) SARS-CoV-2 target nucleic acids are NOT DETECTED.  The SARS-CoV-2 RNA is generally detectable in upper respiratory specimens during the acute phase of infection. The lowest concentration of SARS-CoV-2 viral copies this assay can detect is 138 copies/mL. A negative result does not preclude SARS-Cov-2 infection and should not be used as the sole basis for treatment or other patient management decisions. A negative result may occur with  improper specimen collection/handling, submission of specimen other than nasopharyngeal swab, presence of viral mutation(s) within the areas targeted by this assay, and inadequate number of viral copies(<138 copies/mL). A negative result must be combined with clinical observations, patient history, and epidemiological information. The expected result is Negative.  Fact Sheet for Patients:  EntrepreneurPulse.com.au  Fact Sheet for Healthcare Providers:  IncredibleEmployment.be  This test is no t yet approved or cleared by the [place] FDA and  has been authorized for detection and/or diagnosis of SARS-CoV-2 by FDA under an Emergency Use Authorization (EUA). This EUA will remain  in effect (meaning this test can be used) for the  duration of the COVID-19 declaration under Section 564(b)(1) of the Act, 21 U.S.C.section 360bbb-3(b)(1), unless the authorization is terminated  or revoked sooner.       Influenza A by PCR NEGATIVE NEGATIVE Final   Influenza B by PCR NEGATIVE NEGATIVE Final    Comment: (NOTE) The Xpert Xpress SARS-CoV-2/FLU/RSV plus assay is intended as an aid in the diagnosis of influenza from Nasopharyngeal swab specimens and should not be used as a sole basis for  treatment. Nasal washings and aspirates are unacceptable for Xpert Xpress SARS-CoV-2/FLU/RSV testing.  Fact Sheet for Patients: EntrepreneurPulse.com.au  Fact Sheet for Healthcare Providers: IncredibleEmployment.be  This test is not yet approved or cleared by the [place] FDA and has been authorized for detection and/or diagnosis of SARS-CoV-2 by FDA under an Emergency Use Authorization (EUA). This EUA will remain in effect (meaning this test can be used) for the duration of the COVID-19 declaration under Section 564(b)(1) of the Act, 21 U.S.C. section 360bbb-3(b)(1), unless the authorization is terminated or revoked.  Performed at [facility], [place] [address] [place], [place] [address]      Labs: BNP (last 3 results) Recent Labs    08/17/20 1341  BNP >7,124.5*   Basic Metabolic Panel: Recent Labs  Lab 10/28/20 0730 10/28/20 1551 10/29/20 0530  NA 134* 136 136  K 5.5* 3.6 4.3  CL 94*  --  95*  CO2  --   --  26  GLUCOSE 173*  --  222*  BUN 65*  --  51*  CREATININE 7.30*  --  8.36*  CALCIUM  --   --  8.5*   Liver Function Tests: No results for input(s): AST, ALT, ALKPHOS, BILITOT, PROT, ALBUMIN in the last 168 hours. No results for input(s): LIPASE, AMYLASE in the last 168 hours. No results for input(s): AMMONIA in the last 168 hours. CBC: Recent Labs  Lab 10/28/20 0730 10/28/20 1551 10/29/20 0530 10/29/20 0848  WBC  --   --  12.3* 13.3*  NEUTROABS  --   --   9.9*  --   HGB 11.6* 11.2* 8.7* 9.1*  HCT 34.0* 33.0* 28.2* 30.1*  MCV  --   --  76.8* 78.4*  PLT  --   --  177 173   Cardiac Enzymes: No results for input(s): CKTOTAL, CKMB, CKMBINDEX, TROPONINI in the last 168 hours. BNP: Invalid input(s): POCBNP CBG: Recent Labs  Lab 10/29/20 1846 10/29/20 2118 10/30/20 0619 10/30/20 1237 10/30/20 1711  GLUCAP 199* 251* 220* 230* 183*   D-Dimer No results for input(s): DDIMER in the last 72 hours. Hgb A1c Recent Labs    10/28/20 2030 10/29/20 0848  HGBA1C 9.5* 9.4*   Lipid Profile No results for input(s): CHOL, HDL, LDLCALC, TRIG, CHOLHDL, LDLDIRECT in the last 72 hours. Thyroid function studies No results for input(s): TSH, T4TOTAL, T3FREE, THYROIDAB in the last 72 hours.  Invalid input(s): FREET3 Anemia work up Recent Labs    10/28/20 2030 10/29/20 0530  TIBC 330  --   IRON 65  --   RETICCTPCT  --  1.8   Urinalysis    Component Value Date/Time   COLORURINE YELLOW 10/15/2008 0959   APPEARANCEUR CLOUDY (A) 10/15/2008 0959   LABSPEC 1.025 10/15/2008 0959   PHURINE 5.5 10/15/2008 0959   GLUCOSEU NEGATIVE 10/15/2008 0959   HGBUR NEGATIVE 10/15/2008 0959   BILIRUBINUR SMALL (A) 10/15/2008 0959   KETONESUR NEGATIVE 10/15/2008 0959   PROTEINUR NEGATIVE 10/15/2008 0959   UROBILINOGEN 1.0 10/15/2008 0959   NITRITE NEGATIVE 10/15/2008 0959   LEUKOCYTESUR LARGE (A) 10/15/2008 0959   Sepsis Labs Invalid input(s): PROCALCITONIN,  WBC,  LACTICIDVEN Microbiology Recent Results (from the past 240 hour(s))  Resp Panel by RT-PCR (Flu A&B, Covid) Nasopharyngeal Swab     Status: None   Collection Time: 10/29/20  8:27 AM   Specimen: Nasopharyngeal Swab; Nasopharyngeal(NP) swabs in vial transport medium  Result Value Ref Range Status   SARS Coronavirus 2 by RT PCR NEGATIVE  NEGATIVE Final    Comment: (NOTE) SARS-CoV-2 target nucleic acids are NOT DETECTED.  The SARS-CoV-2 RNA is generally detectable in upper respiratory specimens  during the acute phase of infection. The lowest concentration of SARS-CoV-2 viral copies this assay can detect is 138 copies/mL. A negative result does not preclude SARS-Cov-2 infection and should not be used as the sole basis for treatment or other patient management decisions. A negative result may occur with  improper specimen collection/handling, submission of specimen other than nasopharyngeal swab, presence of viral mutation(s) within the areas targeted by this assay, and inadequate number of viral copies(<138 copies/mL). A negative result must be combined with clinical observations, patient history, and epidemiological information. The expected result is Negative.  Fact Sheet for Patients:  EntrepreneurPulse.com.au  Fact Sheet for Healthcare Providers:  IncredibleEmployment.be  This test is no t yet approved or cleared by the [place] FDA and  has been authorized for detection and/or diagnosis of SARS-CoV-2 by FDA under an Emergency Use Authorization (EUA). This EUA will remain  in effect (meaning this test can be used) for the duration of the COVID-19 declaration under Section 564(b)(1) of the Act, 21 U.S.C.section 360bbb-3(b)(1), unless the authorization is terminated  or revoked sooner.       Influenza A by PCR NEGATIVE NEGATIVE Final   Influenza B by PCR NEGATIVE NEGATIVE Final    Comment: (NOTE) The Xpert Xpress SARS-CoV-2/FLU/RSV plus assay is intended as an aid in the diagnosis of influenza from Nasopharyngeal swab specimens and should not be used as a sole basis for treatment. Nasal washings and aspirates are unacceptable for Xpert Xpress SARS-CoV-2/FLU/RSV testing.  Fact Sheet for Patients: EntrepreneurPulse.com.au  Fact Sheet for Healthcare Providers: IncredibleEmployment.be  This test is not yet approved or cleared by the [place] FDA and has been authorized for detection  and/or diagnosis of SARS-CoV-2 by FDA under an Emergency Use Authorization (EUA). This EUA will remain in effect (meaning this test can be used) for the duration of the COVID-19 declaration under Section 564(b)(1) of the Act, 21 U.S.C. section 360bbb-3(b)(1), unless the authorization is terminated or revoked.  Performed at [facility], [place] [address], [place], [place] [address]      Time coordinating discharge: 36 minutes.   SIGNED:   Hosie Poisson, MD  [facility] 10/30/2020, 5:16 PM

## 2020-10-30 NOTE — Anesthesia Postprocedure Evaluation (Signed)
Anesthesia Post Note  Patient: Gary [name]  Procedure(s) Performed: PATCH ANGIOPLASTY OF LEFT COMMON FEMORAL ARTERY (Left: Groin) LEFT LOWER EXTREMITY  ANGIOGRAM WITH BALLOON ANGIOPLASTY AND SUPERIFICIAL FEMORAL ARTERY STENT PLACEMENT (Left) ENDARTERECTOMY COMMON FEMORAL ARTERY (Left: Groin)     Patient location during evaluation: Other Anesthesia Type: General Level of consciousness: awake and alert Pain management: pain level controlled Vital Signs Assessment: post-procedure vital signs reviewed and stable Respiratory status: spontaneous breathing, nonlabored ventilation, respiratory function stable and patient connected to nasal cannula oxygen Cardiovascular status: blood pressure returned to baseline and stable Postop Assessment: no apparent nausea or vomiting Anesthetic complications: no   No notable events documented.  Last Vitals:  Vitals:   10/30/20 0536 10/30/20 0756  BP: (!) 96/42 (!) 90/40  Pulse: 77 77  Resp: 19 (!) 22  Temp: 36.7 C 36.4 C  SpO2: 100% 99%    Last Pain:  Vitals:   10/30/20 0756  TempSrc: Oral  PainSc:                  [name]

## 2020-10-30 NOTE — Progress Notes (Signed)
Discharged to home with family office visits in place teaching done  

## 2020-10-30 NOTE — Progress Notes (Signed)
[facility]  Subjective  - some incision soreness   Objective (!) 90/40 77 97.6 F (36.4 C) (Oral) (!) 22 99%  Intake/Output Summary (Last 24 hours) at 10/30/2020 4431 Last data filed at 10/29/2020 1753 Gross per 24 hour  Intake --  Output 0 ml  Net 0 ml   Left groin incision intact Left foot cool biphasic peroneal monophasic DP  Assessment/Planning: Left foot with perfusion as good as it is going to get [facility] for d/c from our standpoint Will need follow up in a few weeks to check groin incision  Ruta Hinds 10/30/2020 8:24 AM --  Laboratory Lab Results: Recent Labs    10/29/20 0530 10/29/20 0848  WBC 12.3* 13.3*  HGB 8.7* 9.1*  HCT 28.2* 30.1*  PLT 177 173   BMET Recent Labs    10/28/20 0730 10/28/20 1551 10/29/20 0530  NA 134* 136 136  K 5.5* 3.6 4.3  CL 94*  --  95*  CO2  --   --  26  GLUCOSE 173*  --  222*  BUN 65*  --  51*  CREATININE 7.30*  --  8.36*  CALCIUM  --   --  8.5*    COAG Lab Results  Component Value Date   INR 1.0 10/15/2008   INR 0.9 10/15/2008   No results found for: PTT

## 2020-10-30 NOTE — Evaluation (Signed)
Physical Therapy Evaluation Patient Details Name: Gary [name] MRN: [id number] DOB: 03-30-1956 Today's Date: 10/30/2020   History of Present Illness  Pt is a 65 y/o male presenting on 10/28/2020 for vascular intervention of L common femoral, SFA and profundofemoral endartectomy below-knee and stenting of SFA. PMH: PVD status post right BKA, chronic ischemia left lower extremity, chronic systolic CHF IDPO<24%, ESRD on HD MWF, HTN, HLD, COPD, IIDM.  Clinical Impression  Pt presents to PT with deficits in gait, functional mobility, balance, power, strength, endurance. Pt requiring use of RW to improve balance as opposed to mobilizing with cane at baseline. Pt demonstrates stable gait with walker, without need for physical assistance. Pt will benefit from continued aggressive mobilization to improve gait and balance, in hopes of returning to mobility with use of cane. Pt reports a preference to return to outpatient PT through [facility] at the time of discharge.    Follow Up Recommendations Outpatient PT (pt declines HHPT, would prefer to continue outpatient PT through [facility])    Equipment Recommendations  None recommended by PT    Recommendations for Other Services       Precautions / Restrictions Precautions Precautions: Fall Restrictions Weight Bearing Restrictions: No      Mobility  Bed Mobility Overal bed mobility: Modified Independent                  Transfers Overall transfer level: Needs assistance Equipment used: Rolling walker (2 wheeled);Straight cane Transfers: Sit to/from [facility] Sit to Stand: Min guard Stand pivot transfers: Min guard       General transfer comment: pt performs SPT with SPC, then transfers sit to stand with RW  Ambulation/Gait Ambulation/Gait assistance: Supervision Gait Distance (Feet): 80 Feet Assistive device: Rolling walker (2 wheeled) Gait Pattern/deviations: Step-to pattern Gait velocity: reduced Gait velocity  interpretation: 1.31 - 2.62 ft/sec, indicative of limited community ambulator General Gait Details: pt with slowed step-to gait, reduced stance time on RLE  Stairs            Wheelchair Mobility    Modified Rankin (Stroke Patients Only)       Balance Overall balance assessment: Needs assistance Sitting-balance support: No upper extremity supported;Feet supported Sitting balance-Leahy Scale: Good     Standing balance support: Single extremity supported Standing balance-Leahy Scale: Poor Standing balance comment: reliant on UE support of cane or walker                             Pertinent Vitals/Pain Pain Assessment: 0-10 Pain Score: 7  Pain Location: L foot Pain Descriptors / Indicators: Aching Pain Intervention(s): Limited activity within patient's tolerance    Home Living Family/patient expects to be discharged to:: Private residence Living Arrangements: Spouse/significant other Available Help at Discharge: Family;Available 24 hours/day Type of Home: House Home Access: Stairs to enter;Ramped entrance Entrance Stairs-Rails: Right;Left Entrance Stairs-Number of Steps: 3 Home Layout: One level Home Equipment: Tub bench;Bedside commode;Walker - 2 wheels;Grab bars - tub/shower;Cane - single point Additional Comments: pt reports brakes on his wheelchair remain broken    Prior Function Level of Independence: Independent with assistive device(s)         Comments: pt reports ambulating with use of cane for household distances. modI with ADLs, wife assists with IADLs.     Hand Dominance   Dominant Hand: Right    Extremity/Trunk Assessment   Upper Extremity Assessment Upper Extremity Assessment: Overall WFL for tasks assessed  Lower Extremity Assessment Lower Extremity Assessment: Generalized weakness;RLE deficits/detail RLE Deficits / Details: chronic R BKA. Pt dons prosthetic equipment independently    Cervical / Trunk Assessment Cervical  / Trunk Assessment: Normal  Communication   Communication: No difficulties  Cognition Arousal/Alertness: Awake/alert Behavior During Therapy: WFL for tasks assessed/performed Overall Cognitive Status: No family/caregiver present to determine baseline cognitive functioning                                 General Comments: pt initially reporting january as the month but is able to self-correct and report July. Appears slightly groggy but follows commands well      General Comments General comments (skin integrity, edema, etc.): pt on 2L [place] upon PT arrival, reports he utilizes 3L [place] at baseline. Pulse ox reading is unreliable throughout session but pt does not report SOB. PT increases supplemental oxygen to 3L. Pt with soft BP throughout session. 92/46 (61) pre-mobility, 87/50 (62) sitting, 98/35 (54) upon completion of mobility with final BP of 106/47 (65) with LE elevated in recliner at end of session. Pt does report mild dizziness initially with mobility which improves some with increased activity.    Exercises     Assessment/Plan    PT Assessment Patient needs continued PT services  PT Problem List Decreased strength;Decreased activity tolerance;Decreased balance;Decreased mobility;Pain       PT Treatment Interventions DME instruction;Gait training;Stair training;Functional mobility training;Therapeutic activities;Therapeutic exercise;Balance training;Patient/family education    PT Goals (Current goals can be found in the Care Plan section)  Acute Rehab PT Goals Patient Stated Goal: to go home PT Goal Formulation: With patient Time For Goal Achievement: 11/13/20 Potential to Achieve Goals: Good    Frequency Min 3X/week   Barriers to discharge        Co-evaluation               AM-PAC PT "6 Clicks" Mobility  Outcome Measure Help needed turning from your back to your side while in a flat bed without using bedrails?: None Help needed moving from lying on  your back to sitting on the side of a flat bed without using bedrails?: None Help needed moving to and from a bed to a chair (including a wheelchair)?: A Little Help needed standing up from a chair using your arms (e.g., wheelchair or bedside chair)?: A Little Help needed to walk in hospital room?: A Little Help needed climbing 3-5 steps with a railing? : A Lot 6 Click Score: 19    End of Session Equipment Utilized During Treatment: Oxygen Activity Tolerance: Patient tolerated treatment well Patient left: in chair;with call bell/phone within reach;with chair alarm set Nurse Communication: Mobility status PT Visit Diagnosis: Unsteadiness on feet (R26.81);Other abnormalities of gait and mobility (R26.89);Muscle weakness (generalized) (M62.81);Pain Pain - Right/Left: Left Pain - part of body: Ankle and joints of foot    Time: 6294-7654 PT Time Calculation (min) (ACUTE ONLY): 31 min   Charges:   PT Evaluation $PT Eval Low Complexity: 1 Low          Zenaida Niece, PT, DPT Acute Rehabilitation Pager: [phone]   Zenaida Niece 10/30/2020, 3:02 PM

## 2020-10-31 ENCOUNTER — Telehealth: Payer: Self-pay | Admitting: Nephrology

## 2020-10-31 NOTE — Telephone Encounter (Signed)
Transition of Care Contact from [place]   Date of Discharge: 10/30/20 Date of Contact: 10/31/20 Method of contact: phone Talked to patient   Patient contacted to discuss transition of care form recent hospitaliztion. Patient was admitted to [facility] from 6/30 to 10/30/20 with the discharge diagnosis of LLE non healing ulcer/PVD s/p revascularization.      Medication changes were reviewed - patient not taking Lasix.   Patient will follow up with is outpatient dialysis center 10/31/2020.   Other follow up needs include ongoing L foot pain, adivsed to call [facility] if not improving or worsened.    Jen Mow, PA-C [facility] Pager: [phone]

## 2020-11-01 ENCOUNTER — Emergency Department (HOSPITAL_COMMUNITY): Payer: Medicare (Managed Care)

## 2020-11-01 ENCOUNTER — Inpatient Hospital Stay (HOSPITAL_COMMUNITY): Payer: Medicare (Managed Care)

## 2020-11-01 ENCOUNTER — Encounter (HOSPITAL_COMMUNITY): Payer: Self-pay | Admitting: Internal Medicine

## 2020-11-01 ENCOUNTER — Inpatient Hospital Stay (HOSPITAL_COMMUNITY): Admission: EM | Disposition: E | Payer: Self-pay | Attending: Pulmonary Disease

## 2020-11-01 ENCOUNTER — Inpatient Hospital Stay (HOSPITAL_COMMUNITY): Payer: Medicare (Managed Care) | Admitting: Anesthesiology

## 2020-11-01 DIAGNOSIS — Z01818 Encounter for other preprocedural examination: Secondary | ICD-10-CM

## 2020-11-01 DIAGNOSIS — R570 Cardiogenic shock: Secondary | ICD-10-CM | POA: Diagnosis not present

## 2020-11-01 DIAGNOSIS — Z7982 Long term (current) use of aspirin: Secondary | ICD-10-CM

## 2020-11-01 DIAGNOSIS — I5022 Chronic systolic (congestive) heart failure: Secondary | ICD-10-CM | POA: Diagnosis present

## 2020-11-01 DIAGNOSIS — J449 Chronic obstructive pulmonary disease, unspecified: Secondary | ICD-10-CM | POA: Diagnosis present

## 2020-11-01 DIAGNOSIS — I472 Ventricular tachycardia: Secondary | ICD-10-CM | POA: Diagnosis not present

## 2020-11-01 DIAGNOSIS — I70229 Atherosclerosis of native arteries of extremities with rest pain, unspecified extremity: Secondary | ICD-10-CM | POA: Diagnosis not present

## 2020-11-01 DIAGNOSIS — Z66 Do not resuscitate: Secondary | ICD-10-CM | POA: Diagnosis not present

## 2020-11-01 DIAGNOSIS — Z9981 Dependence on supplemental oxygen: Secondary | ICD-10-CM

## 2020-11-01 DIAGNOSIS — G9341 Metabolic encephalopathy: Secondary | ICD-10-CM

## 2020-11-01 DIAGNOSIS — E11649 Type 2 diabetes mellitus with hypoglycemia without coma: Secondary | ICD-10-CM | POA: Diagnosis not present

## 2020-11-01 DIAGNOSIS — E11621 Type 2 diabetes mellitus with foot ulcer: Secondary | ICD-10-CM | POA: Diagnosis present

## 2020-11-01 DIAGNOSIS — I998 Other disorder of circulatory system: Secondary | ICD-10-CM

## 2020-11-01 DIAGNOSIS — J9601 Acute respiratory failure with hypoxia: Secondary | ICD-10-CM | POA: Diagnosis not present

## 2020-11-01 DIAGNOSIS — N186 End stage renal disease: Secondary | ICD-10-CM | POA: Diagnosis present

## 2020-11-01 DIAGNOSIS — D631 Anemia in chronic kidney disease: Secondary | ICD-10-CM | POA: Diagnosis present

## 2020-11-01 DIAGNOSIS — I70222 Atherosclerosis of native arteries of extremities with rest pain, left leg: Secondary | ICD-10-CM | POA: Diagnosis present

## 2020-11-01 DIAGNOSIS — E1165 Type 2 diabetes mellitus with hyperglycemia: Secondary | ICD-10-CM | POA: Diagnosis present

## 2020-11-01 DIAGNOSIS — F17210 Nicotine dependence, cigarettes, uncomplicated: Secondary | ICD-10-CM | POA: Diagnosis present

## 2020-11-01 DIAGNOSIS — I739 Peripheral vascular disease, unspecified: Secondary | ICD-10-CM

## 2020-11-01 DIAGNOSIS — Z95828 Presence of other vascular implants and grafts: Secondary | ICD-10-CM

## 2020-11-01 DIAGNOSIS — Z515 Encounter for palliative care: Secondary | ICD-10-CM

## 2020-11-01 DIAGNOSIS — D689 Coagulation defect, unspecified: Secondary | ICD-10-CM | POA: Diagnosis present

## 2020-11-01 DIAGNOSIS — L97529 Non-pressure chronic ulcer of other part of left foot with unspecified severity: Secondary | ICD-10-CM | POA: Diagnosis present

## 2020-11-01 DIAGNOSIS — Z992 Dependence on renal dialysis: Secondary | ICD-10-CM | POA: Diagnosis not present

## 2020-11-01 DIAGNOSIS — Z20822 Contact with and (suspected) exposure to covid-19: Secondary | ICD-10-CM | POA: Diagnosis present

## 2020-11-01 DIAGNOSIS — E872 Acidosis: Secondary | ICD-10-CM | POA: Diagnosis not present

## 2020-11-01 DIAGNOSIS — I469 Cardiac arrest, cause unspecified: Secondary | ICD-10-CM | POA: Diagnosis not present

## 2020-11-01 DIAGNOSIS — E7800 Pure hypercholesterolemia, unspecified: Secondary | ICD-10-CM | POA: Diagnosis present

## 2020-11-01 DIAGNOSIS — Z8249 Family history of ischemic heart disease and other diseases of the circulatory system: Secondary | ICD-10-CM

## 2020-11-01 DIAGNOSIS — Z89412 Acquired absence of left great toe: Secondary | ICD-10-CM

## 2020-11-01 DIAGNOSIS — I214 Non-ST elevation (NSTEMI) myocardial infarction: Secondary | ICD-10-CM | POA: Diagnosis not present

## 2020-11-01 DIAGNOSIS — R579 Shock, unspecified: Secondary | ICD-10-CM | POA: Diagnosis not present

## 2020-11-01 DIAGNOSIS — E1122 Type 2 diabetes mellitus with diabetic chronic kidney disease: Secondary | ICD-10-CM | POA: Diagnosis present

## 2020-11-01 DIAGNOSIS — I132 Hypertensive heart and chronic kidney disease with heart failure and with stage 5 chronic kidney disease, or end stage renal disease: Secondary | ICD-10-CM | POA: Diagnosis present

## 2020-11-01 DIAGNOSIS — Z89511 Acquired absence of right leg below knee: Secondary | ICD-10-CM

## 2020-11-01 DIAGNOSIS — D62 Acute posthemorrhagic anemia: Secondary | ICD-10-CM | POA: Diagnosis not present

## 2020-11-01 DIAGNOSIS — I462 Cardiac arrest due to underlying cardiac condition: Secondary | ICD-10-CM | POA: Diagnosis not present

## 2020-11-01 DIAGNOSIS — K7200 Acute and subacute hepatic failure without coma: Secondary | ICD-10-CM | POA: Diagnosis not present

## 2020-11-01 DIAGNOSIS — E1151 Type 2 diabetes mellitus with diabetic peripheral angiopathy without gangrene: Secondary | ICD-10-CM | POA: Diagnosis present

## 2020-11-01 DIAGNOSIS — I5020 Unspecified systolic (congestive) heart failure: Secondary | ICD-10-CM | POA: Diagnosis not present

## 2020-11-01 DIAGNOSIS — Z7902 Long term (current) use of antithrombotics/antiplatelets: Secondary | ICD-10-CM

## 2020-11-01 DIAGNOSIS — I509 Heart failure, unspecified: Secondary | ICD-10-CM

## 2020-11-01 DIAGNOSIS — N2581 Secondary hyperparathyroidism of renal origin: Secondary | ICD-10-CM | POA: Diagnosis present

## 2020-11-01 DIAGNOSIS — R578 Other shock: Secondary | ICD-10-CM

## 2020-11-01 DIAGNOSIS — Z833 Family history of diabetes mellitus: Secondary | ICD-10-CM

## 2020-11-01 DIAGNOSIS — Z5309 Procedure and treatment not carried out because of other contraindication: Secondary | ICD-10-CM | POA: Diagnosis not present

## 2020-11-01 DIAGNOSIS — E871 Hypo-osmolality and hyponatremia: Secondary | ICD-10-CM | POA: Diagnosis present

## 2020-11-01 DIAGNOSIS — M79605 Pain in left leg: Secondary | ICD-10-CM

## 2020-11-01 DIAGNOSIS — Z452 Encounter for adjustment and management of vascular access device: Secondary | ICD-10-CM | POA: Diagnosis not present

## 2020-11-01 DIAGNOSIS — E559 Vitamin D deficiency, unspecified: Secondary | ICD-10-CM | POA: Diagnosis present

## 2020-11-01 DIAGNOSIS — I447 Left bundle-branch block, unspecified: Secondary | ICD-10-CM | POA: Diagnosis present

## 2020-11-01 DIAGNOSIS — D649 Anemia, unspecified: Secondary | ICD-10-CM | POA: Diagnosis not present

## 2020-11-01 DIAGNOSIS — I255 Ischemic cardiomyopathy: Secondary | ICD-10-CM | POA: Diagnosis present

## 2020-11-01 DIAGNOSIS — I2510 Atherosclerotic heart disease of native coronary artery without angina pectoris: Secondary | ICD-10-CM | POA: Diagnosis present

## 2020-11-01 DIAGNOSIS — N50819 Testicular pain, unspecified: Secondary | ICD-10-CM

## 2020-11-01 HISTORY — PX: FEMORAL-POPLITEAL BYPASS GRAFT: SHX937

## 2020-11-01 LAB — CBC WITH DIFFERENTIAL/PLATELET
Abs Immature Granulocytes: 0.06 10*3/uL (ref 0.00–0.07)
Basophils Absolute: 0 10*3/uL (ref 0.0–0.1)
Basophils Relative: 0 %
Eosinophils Absolute: 0 10*3/uL (ref 0.0–0.5)
Eosinophils Relative: 0 %
HCT: 26.7 % — ABNORMAL LOW (ref 39.0–52.0)
Hemoglobin: 8.5 g/dL — ABNORMAL LOW (ref 13.0–17.0)
Immature Granulocytes: 1 %
Lymphocytes Relative: 14 %
Lymphs Abs: 1.6 10*3/uL (ref 0.7–4.0)
MCH: 24.1 pg — ABNORMAL LOW (ref 26.0–34.0)
MCHC: 31.8 g/dL (ref 30.0–36.0)
MCV: 75.6 fL — ABNORMAL LOW (ref 80.0–100.0)
Monocytes Absolute: 1 10*3/uL (ref 0.1–1.0)
Monocytes Relative: 9 %
Neutro Abs: 8.9 10*3/uL — ABNORMAL HIGH (ref 1.7–7.7)
Neutrophils Relative %: 76 %
Platelets: 181 10*3/uL (ref 150–400)
RBC: 3.53 MIL/uL — ABNORMAL LOW (ref 4.22–5.81)
RDW: 17.7 % — ABNORMAL HIGH (ref 11.5–15.5)
WBC: 11.5 10*3/uL — ABNORMAL HIGH (ref 4.0–10.5)
nRBC: 0.3 % — ABNORMAL HIGH (ref 0.0–0.2)

## 2020-11-01 LAB — TROPONIN I (HIGH SENSITIVITY)
Troponin I (High Sensitivity): 590 ng/L (ref <18)
Troponin I (High Sensitivity): 648 ng/L (ref <18)

## 2020-11-01 LAB — ECHOCARDIOGRAM COMPLETE
AR max vel: 1.6 cm2
AV Area VTI: 1.47 cm2
AV Area mean vel: 1.57 cm2
AV Mean grad: 3 mmHg
AV Peak grad: 6.5 mmHg
Ao pk vel: 1.27 m/s
Height: 66 in
MV M vel: 4.37 m/s
MV Peak grad: 76.4 mmHg
Radius: 0.5 cm
S' Lateral: 4.9 cm
Weight: 2102.31 oz

## 2020-11-01 LAB — BASIC METABOLIC PANEL
Anion gap: 16 — ABNORMAL HIGH (ref 5–15)
Anion gap: 25 — ABNORMAL HIGH (ref 5–15)
BUN: 16 mg/dL (ref 8–23)
BUN: 21 mg/dL (ref 8–23)
CO2: 26 mmol/L (ref 22–32)
CO2: 13 mmol/L — ABNORMAL LOW (ref 22–32)
Calcium: 8.1 mg/dL — ABNORMAL LOW (ref 8.9–10.3)
Calcium: 8 mg/dL — ABNORMAL LOW (ref 8.9–10.3)
Chloride: 92 mmol/L — ABNORMAL LOW (ref 98–111)
Chloride: 96 mmol/L — ABNORMAL LOW (ref 98–111)
Creatinine, Ser: 3.28 mg/dL — ABNORMAL HIGH (ref 0.61–1.24)
Creatinine, Ser: 3.91 mg/dL — ABNORMAL HIGH (ref 0.61–1.24)
GFR, Estimated: 20 mL/min — ABNORMAL LOW (ref >60)
GFR, Estimated: 16 mL/min — ABNORMAL LOW (ref >60)
Glucose, Bld: 135 mg/dL — ABNORMAL HIGH (ref 70–99)
Glucose, Bld: 54 mg/dL — ABNORMAL LOW (ref 70–99)
Potassium: 3.6 mmol/L (ref 3.5–5.1)
Potassium: 4.3 mmol/L (ref 3.5–5.1)
Sodium: 134 mmol/L — ABNORMAL LOW (ref 135–145)
Sodium: 134 mmol/L — ABNORMAL LOW (ref 135–145)

## 2020-11-01 LAB — PROTIME-INR
INR: 1.6 — ABNORMAL HIGH (ref 0.8–1.2)
Prothrombin Time: 18.8 seconds — ABNORMAL HIGH (ref 11.4–15.2)

## 2020-11-01 LAB — POCT I-STAT 7, (LYTES, BLD GAS, ICA,H+H)
Acid-base deficit: 6 mmol/L — ABNORMAL HIGH (ref 0.0–2.0)
Bicarbonate: 17.6 mmol/L — ABNORMAL LOW (ref 20.0–28.0)
Calcium, Ion: 0.91 mmol/L — ABNORMAL LOW (ref 1.15–1.40)
HCT: 29 % — ABNORMAL LOW (ref 39.0–52.0)
Hemoglobin: 9.9 g/dL — ABNORMAL LOW (ref 13.0–17.0)
O2 Saturation: 100 %
Patient temperature: 36
Potassium: 4.1 mmol/L (ref 3.5–5.1)
Sodium: 134 mmol/L — ABNORMAL LOW (ref 135–145)
TCO2: 19 mmol/L — ABNORMAL LOW (ref 22–32)
pCO2 arterial: 27.5 mmHg — ABNORMAL LOW (ref 32.0–48.0)
pH, Arterial: 7.41 (ref 7.350–7.450)
pO2, Arterial: 188 mmHg — ABNORMAL HIGH (ref 83.0–108.0)

## 2020-11-01 LAB — LACTIC ACID, PLASMA: Lactic Acid, Venous: >11 mmol/L (ref 0.5–1.9)

## 2020-11-01 LAB — GLUCOSE, CAPILLARY
Glucose-Capillary: 111 mg/dL — ABNORMAL HIGH (ref 70–99)
Glucose-Capillary: 77 mg/dL (ref 70–99)
Glucose-Capillary: 23 mg/dL — CL (ref 70–99)
Glucose-Capillary: 38 mg/dL — CL (ref 70–99)
Glucose-Capillary: 171 mg/dL — ABNORMAL HIGH (ref 70–99)

## 2020-11-01 LAB — APTT: aPTT: 39 seconds — ABNORMAL HIGH (ref 24–36)

## 2020-11-01 LAB — RESP PANEL BY RT-PCR (FLU A&B, COVID) ARPGX2
Influenza A by PCR: NEGATIVE
Influenza B by PCR: NEGATIVE
SARS Coronavirus 2 by RT PCR: NEGATIVE

## 2020-11-01 LAB — MRSA NEXT GEN BY PCR, NASAL: MRSA by PCR Next Gen: NOT DETECTED

## 2020-11-01 LAB — HEPARIN LEVEL (UNFRACTIONATED): Heparin Unfractionated: 0.16 IU/mL — ABNORMAL LOW (ref 0.30–0.70)

## 2020-11-01 SURGERY — BYPASS GRAFT FEMORAL-POPLITEAL ARTERY
Anesthesia: General | Laterality: Left

## 2020-11-01 MED ORDER — ASPIRIN 81 MG PO CHEW: 81.0000 mg | CHEWABLE_TABLET | Freq: Every day | ORAL | Status: DC

## 2020-11-01 MED ORDER — ALBUTEROL SULFATE (2.5 MG/3ML) 0.083% IN NEBU: 2.5000 mg | INHALATION_SOLUTION | Freq: Four times a day (QID) | RESPIRATORY_TRACT | Status: DC | PRN

## 2020-11-01 MED ORDER — INSULIN ASPART 100 UNIT/ML IJ SOLN: 0.0000 [IU] | Freq: Three times a day (TID) | INTRAMUSCULAR | Status: DC

## 2020-11-01 MED ORDER — FENTANYL CITRATE (PF) 100 MCG/2ML IJ SOLN: 25.0000 ug | INTRAMUSCULAR | Status: DC | PRN

## 2020-11-01 MED ORDER — CLOPIDOGREL BISULFATE 75 MG PO TABS: 75.0000 mg | ORAL_TABLET | Freq: Every day | ORAL | Status: DC

## 2020-11-01 MED ORDER — OXYCODONE HCL 5 MG PO TABS: 5.0000 mg | ORAL_TABLET | ORAL | Status: DC | PRN

## 2020-11-01 MED ORDER — METOCLOPRAMIDE HCL 5 MG PO TABS
5.0000 mg | ORAL_TABLET | Freq: Two times a day (BID) | ORAL | Status: DC | PRN
  Administered 2020-11-03: 5 mg via ORAL
  Filled 2020-11-01 (×2): qty 1

## 2020-11-01 MED ORDER — ORAL CARE MOUTH RINSE
15.0000 mL | Freq: Two times a day (BID) | OROMUCOSAL | Status: DC
  Administered 2020-11-01 – 2020-11-03 (×4): 15 mL via OROMUCOSAL

## 2020-11-01 MED ORDER — RENA-VITE PO TABS
1.0000 | ORAL_TABLET | Freq: Every day | ORAL | Status: DC
  Administered 2020-11-01 – 2020-11-02 (×2): 1 via ORAL
  Filled 2020-11-01 (×2): qty 1

## 2020-11-01 MED ORDER — HEPARIN 6000 UNIT IRRIGATION SOLUTION
Status: DC | PRN
  Administered 2020-11-01: 500

## 2020-11-01 MED ORDER — EPINEPHRINE 1 MG/10ML IJ SOSY
PREFILLED_SYRINGE | INTRAMUSCULAR | Status: DC | PRN
  Administered 2020-11-01 (×2): 50 ug via INTRAVENOUS

## 2020-11-01 MED ORDER — CHLORHEXIDINE GLUCONATE 0.12 % MT SOLN: 15.0000 mL | Freq: Once | OROMUCOSAL | Status: AC

## 2020-11-01 MED ORDER — BISACODYL 5 MG PO TBEC
5.0000 mg | DELAYED_RELEASE_TABLET | Freq: Every day | ORAL | Status: DC
  Administered 2020-11-03: 5 mg via ORAL
  Filled 2020-11-01: qty 1

## 2020-11-01 MED ORDER — HEPARIN (PORCINE) 25000 UT/250ML-% IV SOLN
1600.0000 [IU]/h | INTRAVENOUS | Status: DC
  Administered 2020-11-01: 1000 [IU]/h via INTRAVENOUS
  Administered 2020-11-02: 1300 [IU]/h via INTRAVENOUS
  Administered 2020-11-03: 1600 [IU]/h via INTRAVENOUS
  Filled 2020-11-01 (×3): qty 250

## 2020-11-01 MED ORDER — INSULIN ASPART 100 UNIT/ML IJ SOLN
1.0000 [IU] | INTRAMUSCULAR | Status: DC
  Administered 2020-11-02: 1 [IU] via SUBCUTANEOUS
  Administered 2020-11-02 (×2): 2 [IU] via SUBCUTANEOUS
  Administered 2020-11-03 (×2): 1 [IU] via SUBCUTANEOUS
  Administered 2020-11-03: 2 [IU] via SUBCUTANEOUS

## 2020-11-01 MED ORDER — FAMOTIDINE 20 MG PO TABS
20.0000 mg | ORAL_TABLET | Freq: Every day | ORAL | Status: DC
  Administered 2020-11-01 – 2020-11-02 (×2): 20 mg via ORAL
  Filled 2020-11-01 (×2): qty 1

## 2020-11-01 MED ORDER — PHENYLEPHRINE HCL-NACL 10-0.9 MG/250ML-% IV SOLN
INTRAVENOUS | Status: DC | PRN
  Administered 2020-11-01: 50 ug/min via INTRAVENOUS

## 2020-11-01 MED ORDER — SODIUM CHLORIDE 0.9 % IV SOLN: INTRAVENOUS | Status: DC

## 2020-11-01 MED ORDER — EPHEDRINE SULFATE-NACL 50-0.9 MG/10ML-% IV SOSY
PREFILLED_SYRINGE | INTRAVENOUS | Status: DC | PRN
  Administered 2020-11-01 (×2): 10 mg via INTRAVENOUS

## 2020-11-01 MED ORDER — SODIUM CHLORIDE (PF) 0.9 % IJ SOLN
INTRAMUSCULAR | Status: AC
  Filled 2020-11-01: qty 10

## 2020-11-01 MED ORDER — PHENYLEPHRINE-MINERAL OIL-PET 0.25-14-74.9 % RE OINT: 1.0000 "application " | TOPICAL_OINTMENT | Freq: Two times a day (BID) | RECTAL | Status: DC | PRN

## 2020-11-01 MED ORDER — SENNOSIDES-DOCUSATE SODIUM 8.6-50 MG PO TABS
2.0000 | ORAL_TABLET | Freq: Every day | ORAL | Status: DC
  Administered 2020-11-02: 2 via ORAL
  Filled 2020-11-01: qty 2

## 2020-11-01 MED ORDER — VASOPRESSIN 20 UNIT/ML IV SOLN
INTRAVENOUS | Status: DC | PRN
  Administered 2020-11-01 (×3): 2 [IU] via INTRAVENOUS

## 2020-11-01 MED ORDER — NALOXONE HCL 0.4 MG/ML IJ SOLN
INTRAMUSCULAR | Status: DC | PRN
  Administered 2020-11-01 (×4): 40 ug via INTRAVENOUS

## 2020-11-01 MED ORDER — HEPARIN BOLUS VIA INFUSION
50.0000 [IU]/kg | Freq: Once | INTRAVENOUS | Status: AC
  Administered 2020-11-01: 2980 [IU] via INTRAVENOUS
  Filled 2020-11-01: qty 2980

## 2020-11-01 MED ORDER — PROPOFOL 10 MG/ML IV BOLUS
INTRAVENOUS | Status: DC | PRN
  Administered 2020-11-01: 120 mg via INTRAVENOUS

## 2020-11-01 MED ORDER — MIDAZOLAM HCL 2 MG/2ML IJ SOLN
INTRAMUSCULAR | Status: AC
  Filled 2020-11-01: qty 2

## 2020-11-01 MED ORDER — NITROGLYCERIN 0.4 % RE OINT: 1.0000 "application " | TOPICAL_OINTMENT | Freq: Two times a day (BID) | RECTAL | Status: DC | PRN

## 2020-11-01 MED ORDER — CHLORHEXIDINE GLUCONATE 0.12 % MT SOLN
OROMUCOSAL | Status: AC
  Administered 2020-11-01: 15 mL via OROMUCOSAL
  Filled 2020-11-01: qty 15

## 2020-11-01 MED ORDER — SUCCINYLCHOLINE 20MG/ML (10ML) SYRINGE FOR MEDFUSION PUMP - OPTIME
INTRAMUSCULAR | Status: DC | PRN
  Administered 2020-11-01: 100 mg via INTRAVENOUS

## 2020-11-01 MED ORDER — HYDROCODONE-ACETAMINOPHEN 5-325 MG PO TABS
1.0000 | ORAL_TABLET | ORAL | Status: DC | PRN
  Administered 2020-11-01 – 2020-11-02 (×3): 1 via ORAL
  Filled 2020-11-01 (×3): qty 1

## 2020-11-01 MED ORDER — ACETAMINOPHEN 500 MG PO TABS: 1000.0000 mg | ORAL_TABLET | Freq: Three times a day (TID) | ORAL | Status: DC | PRN

## 2020-11-01 MED ORDER — NOREPINEPHRINE 4 MG/250ML-% IV SOLN
INTRAVENOUS | Status: AC
  Administered 2020-11-01: 5 ug/min via INTRAVENOUS
  Filled 2020-11-01: qty 250

## 2020-11-01 MED ORDER — BENZOCAINE (TOPICAL) 20 % EX OINT: 1.0000 "application " | TOPICAL_OINTMENT | Freq: Every day | CUTANEOUS | Status: DC | PRN

## 2020-11-01 MED ORDER — CLOPIDOGREL BISULFATE 75 MG PO TABS
75.0000 mg | ORAL_TABLET | Freq: Every day | ORAL | Status: DC
  Administered 2020-11-01 – 2020-11-03 (×3): 75 mg via ORAL
  Filled 2020-11-01 (×3): qty 1

## 2020-11-01 MED ORDER — VITAMIN D (ERGOCALCIFEROL) 1.25 MG (50000 UNIT) PO CAPS: 50000.0000 [IU] | ORAL_CAPSULE | ORAL | Status: DC

## 2020-11-01 MED ORDER — POLYETHYLENE GLYCOL 3350 17 G PO PACK
17.0000 g | PACK | Freq: Every day | ORAL | Status: DC
  Administered 2020-11-03: 17 g via ORAL
  Filled 2020-11-01: qty 1

## 2020-11-01 MED ORDER — NALOXONE HCL 0.4 MG/ML IJ SOLN
INTRAMUSCULAR | Status: AC
  Filled 2020-11-01: qty 1

## 2020-11-01 MED ORDER — FENTANYL CITRATE (PF) 100 MCG/2ML IJ SOLN
50.0000 ug | Freq: Once | INTRAMUSCULAR | Status: AC
  Administered 2020-11-01: 50 ug via INTRAVENOUS
  Filled 2020-11-01: qty 2

## 2020-11-01 MED ORDER — ALBUMIN HUMAN 5 % IV SOLN: INTRAVENOUS | Status: DC | PRN

## 2020-11-01 MED ORDER — UMECLIDINIUM-VILANTEROL 62.5-25 MCG/INH IN AEPB: 1.0000 | INHALATION_SPRAY | Freq: Every day | RESPIRATORY_TRACT | Status: DC

## 2020-11-01 MED ORDER — FENTANYL CITRATE (PF) 250 MCG/5ML IJ SOLN
INTRAMUSCULAR | Status: DC | PRN
  Administered 2020-11-01: 100 ug via INTRAVENOUS

## 2020-11-01 MED ORDER — ONDANSETRON HCL 4 MG/2ML IJ SOLN
INTRAMUSCULAR | Status: DC | PRN
  Administered 2020-11-01: 4 mg via INTRAVENOUS

## 2020-11-01 MED ORDER — POLYETHYLENE GLYCOL 3350 17 G PO PACK: 17.0000 g | PACK | Freq: Every day | ORAL | Status: DC | PRN

## 2020-11-01 MED ORDER — ASPIRIN 81 MG PO CHEW
81.0000 mg | CHEWABLE_TABLET | Freq: Every day | ORAL | Status: DC
  Administered 2020-11-01 – 2020-11-03 (×3): 81 mg via ORAL
  Filled 2020-11-01 (×3): qty 1

## 2020-11-01 MED ORDER — EPINEPHRINE HCL 5 MG/250ML IV SOLN IN NS
0.5000 ug/min | INTRAVENOUS | Status: DC
  Filled 2020-11-01: qty 250

## 2020-11-01 MED ORDER — CEFAZOLIN SODIUM-DEXTROSE 2-4 GM/100ML-% IV SOLN
INTRAVENOUS | Status: AC
  Filled 2020-11-01: qty 100

## 2020-11-01 MED ORDER — VASOPRESSIN 20 UNIT/ML IV SOLN
INTRAVENOUS | Status: AC
  Filled 2020-11-01: qty 1

## 2020-11-01 MED ORDER — HEPARIN 6000 UNIT IRRIGATION SOLUTION
Status: AC
  Filled 2020-11-01: qty 500

## 2020-11-01 MED ORDER — FENTANYL CITRATE (PF) 250 MCG/5ML IJ SOLN
INTRAMUSCULAR | Status: AC
  Filled 2020-11-01: qty 5

## 2020-11-01 MED ORDER — DOCUSATE SODIUM 100 MG PO CAPS: 100.0000 mg | ORAL_CAPSULE | Freq: Two times a day (BID) | ORAL | Status: DC | PRN

## 2020-11-01 MED ORDER — DEXTROSE 50 % IV SOLN
25.0000 g | INTRAVENOUS | Status: AC
  Administered 2020-11-01: 25 g via INTRAVENOUS
  Filled 2020-11-01: qty 50

## 2020-11-01 MED ORDER — UMECLIDINIUM-VILANTEROL 62.5-25 MCG/INH IN AEPB
1.0000 | INHALATION_SPRAY | Freq: Every day | RESPIRATORY_TRACT | Status: DC
  Administered 2020-11-02 – 2020-11-03 (×2): 1 via RESPIRATORY_TRACT
  Filled 2020-11-01: qty 14

## 2020-11-01 MED ORDER — PHENYLEPHRINE HCL-NACL 10-0.9 MG/250ML-% IV SOLN: 25.0000 ug/min | INTRAVENOUS | Status: DC

## 2020-11-01 MED ORDER — GLUCOSE 40 % PO GEL
ORAL | Status: AC
  Filled 2020-11-01: qty 2

## 2020-11-01 MED ORDER — SODIUM CHLORIDE 0.9 % IV SOLN: 250.0000 mL | INTRAVENOUS | Status: DC

## 2020-11-01 MED ORDER — SODIUM BICARBONATE 8.4 % IV SOLN
50.0000 meq | Freq: Once | INTRAVENOUS | Status: AC
  Administered 2020-11-01: 50 meq via INTRAVENOUS

## 2020-11-01 MED ORDER — 0.9 % SODIUM CHLORIDE (POUR BTL) OPTIME
TOPICAL | Status: DC | PRN
  Administered 2020-11-01: 2000 mL

## 2020-11-01 MED ORDER — ORAL CARE MOUTH RINSE: 15.0000 mL | Freq: Once | OROMUCOSAL | Status: AC

## 2020-11-01 MED ORDER — NOREPINEPHRINE 4 MG/250ML-% IV SOLN
0.0000 ug/min | INTRAVENOUS | Status: DC
  Administered 2020-11-02: 6 ug/min via INTRAVENOUS
  Administered 2020-11-02: 7 ug/min via INTRAVENOUS
  Administered 2020-11-03 (×2): 9 ug/min via INTRAVENOUS
  Filled 2020-11-01 (×4): qty 250

## 2020-11-01 MED ORDER — ROSUVASTATIN CALCIUM 20 MG PO TABS
20.0000 mg | ORAL_TABLET | Freq: Every day | ORAL | Status: DC
  Administered 2020-11-01: 20 mg via ORAL
  Filled 2020-11-01: qty 1

## 2020-11-01 MED ORDER — PROPOFOL 10 MG/ML IV BOLUS
INTRAVENOUS | Status: AC
  Filled 2020-11-01: qty 20

## 2020-11-01 MED ORDER — CHLORHEXIDINE GLUCONATE CLOTH 2 % EX PADS
6.0000 | MEDICATED_PAD | Freq: Every day | CUTANEOUS | Status: DC
  Administered 2020-11-01 – 2020-11-03 (×3): 6 via TOPICAL

## 2020-11-01 MED ORDER — GLUCOSE 40 % PO GEL
2.0000 | ORAL | Status: AC
  Administered 2020-11-01: 62 g via ORAL

## 2020-11-01 MED ORDER — NEPRO/CARBSTEADY PO LIQD
240.0000 mL | Freq: Every day | ORAL | Status: DC
  Administered 2020-11-02 – 2020-11-03 (×2): 240 mL via ORAL

## 2020-11-01 MED ORDER — LIDOCAINE 2% (20 MG/ML) 5 ML SYRINGE
INTRAMUSCULAR | Status: DC | PRN
  Administered 2020-11-01: 100 mg via INTRAVENOUS

## 2020-11-01 MED ORDER — ROSUVASTATIN CALCIUM 20 MG PO TABS: 20.0000 mg | ORAL_TABLET | Freq: Every day | ORAL | Status: DC

## 2020-11-01 MED ORDER — PHENYLEPHRINE 40 MCG/ML (10ML) SYRINGE FOR IV PUSH (FOR BLOOD PRESSURE SUPPORT)
PREFILLED_SYRINGE | INTRAVENOUS | Status: DC | PRN
  Administered 2020-11-01: 120 ug via INTRAVENOUS
  Administered 2020-11-01: 160 ug via INTRAVENOUS

## 2020-11-01 SURGICAL SUPPLY — 56 items
BAG COUNTER SPONGE SURGICOUNT (BAG) ×2 IMPLANT
BAG SPNG CNTER NS LX DISP (BAG) ×1
BANDAGE ESMARK 6X9 LF (GAUZE/BANDAGES/DRESSINGS) IMPLANT
BNDG ESMARK 6X9 LF (GAUZE/BANDAGES/DRESSINGS)
CANISTER SUCT 3000ML PPV (MISCELLANEOUS) ×2 IMPLANT
CANNULA VESSEL 3MM 2 BLNT TIP (CANNULA) ×2 IMPLANT
CLIP VESOCCLUDE MED 24/CT (CLIP) ×2 IMPLANT
CLIP VESOCCLUDE SM WIDE 24/CT (CLIP) ×2 IMPLANT
CUFF TOURN SGL QUICK 24 (TOURNIQUET CUFF)
CUFF TOURN SGL QUICK 34 (TOURNIQUET CUFF)
CUFF TOURN SGL QUICK 42 (TOURNIQUET CUFF) IMPLANT
CUFF TRNQT CYL 24X4X16.5-23 (TOURNIQUET CUFF) IMPLANT
CUFF TRNQT CYL 34X4.125X (TOURNIQUET CUFF) IMPLANT
DERMABOND ADVANCED (GAUZE/BANDAGES/DRESSINGS) ×1
DERMABOND ADVANCED .7 DNX12 (GAUZE/BANDAGES/DRESSINGS) ×1 IMPLANT
DRAIN CHANNEL 15F RND FF W/TCR (WOUND CARE) IMPLANT
DRAPE HALF SHEET 40X57 (DRAPES) IMPLANT
DRAPE X-RAY CASS 24X20 (DRAPES) IMPLANT
ELECT REM PT RETURN 9FT ADLT (ELECTROSURGICAL) ×2
ELECTRODE REM PT RTRN 9FT ADLT (ELECTROSURGICAL) ×1 IMPLANT
EVACUATOR SILICONE 100CC (DRAIN) IMPLANT
GLOVE SURG POLYISO LF SZ7.5 (GLOVE) ×2 IMPLANT
GLOVE SURG UNDER POLY LF SZ7.5 (GLOVE) ×2 IMPLANT
GOWN STRL REUS W/ TWL LRG LVL3 (GOWN DISPOSABLE) ×2 IMPLANT
GOWN STRL REUS W/ TWL XL LVL3 (GOWN DISPOSABLE) ×1 IMPLANT
GOWN STRL REUS W/TWL LRG LVL3 (GOWN DISPOSABLE) ×4
GOWN STRL REUS W/TWL XL LVL3 (GOWN DISPOSABLE) ×2
HEMOSTAT SNOW SURGICEL 2X4 (HEMOSTASIS) IMPLANT
INSERT FOGARTY SM (MISCELLANEOUS) IMPLANT
KIT BASIN OR (CUSTOM PROCEDURE TRAY) ×2 IMPLANT
KIT TURNOVER KIT B (KITS) ×2 IMPLANT
MARKER GRAFT CORONARY BYPASS (MISCELLANEOUS) IMPLANT
NS IRRIG 1000ML POUR BTL (IV SOLUTION) ×4 IMPLANT
PACK PERIPHERAL VASCULAR (CUSTOM PROCEDURE TRAY) ×2 IMPLANT
PAD ARMBOARD 7.5X6 YLW CONV (MISCELLANEOUS) ×4 IMPLANT
SET COLLECT BLD 21X3/4 12 (NEEDLE) IMPLANT
STOPCOCK 4 WAY LG BORE MALE ST (IV SETS) IMPLANT
SUT ETHILON 3 0 PS 1 (SUTURE) IMPLANT
SUT GORETEX 6.0 TT13 (SUTURE) IMPLANT
SUT GORETEX 6.0 TT9 (SUTURE) IMPLANT
SUT PROLENE 5 0 C 1 24 (SUTURE) ×2 IMPLANT
SUT PROLENE 6 0 BV (SUTURE) ×2 IMPLANT
SUT PROLENE 7 0 BV 1 (SUTURE) IMPLANT
SUT SILK 2 0 SH (SUTURE) ×2 IMPLANT
SUT SILK 3 0 (SUTURE)
SUT SILK 3-0 18XBRD TIE 12 (SUTURE) IMPLANT
SUT VIC AB 2-0 CT1 27 (SUTURE) ×4
SUT VIC AB 2-0 CT1 TAPERPNT 27 (SUTURE) ×2 IMPLANT
SUT VIC AB 3-0 SH 27 (SUTURE) ×4
SUT VIC AB 3-0 SH 27X BRD (SUTURE) ×2 IMPLANT
SUT VICRYL 4-0 PS2 18IN ABS (SUTURE) ×4 IMPLANT
TOWEL GREEN STERILE (TOWEL DISPOSABLE) ×2 IMPLANT
TRAY FOLEY MTR SLVR 16FR STAT (SET/KITS/TRAYS/PACK) ×2 IMPLANT
TUBING EXTENTION W/L.L. (IV SETS) IMPLANT
UNDERPAD 30X36 HEAVY ABSORB (UNDERPADS AND DIAPERS) ×2 IMPLANT
WATER STERILE IRR 1000ML POUR (IV SOLUTION) ×2 IMPLANT

## 2020-11-01 NOTE — Progress Notes (Signed)
ANTICOAGULATION CONSULT NOTE - Initial Consult  Pharmacy Consult for heparin Indication:  limb ischemia  Allergies  Allergen Reactions   Oxycodone-Acetaminophen Rash and Other (See Comments)    Macular papular rash after two days on Oxy/APAP resolved with d/c of drug.    Patient Measurements:   Heparin Dosing Weight: 59.6 kg  Estimated Creatinine Clearance: 7.5 mL/min (A) (by C-G formula based on SCr of 8.36 mg/dL (H)).  Medical History:  Assessment: 65 yo M with cold extremity s/p R BKA. Hx of PVD, chronic ischemia of LLE. Recently patient underwent left common femoral, SFA and profundofemoral endarterectomy below-knee and stenting of SFA. L foot is cool to touch and distal 1/3. Faint DP pulse.  Goal of Therapy:  Heparin level 0.3-0.7 units/ml Monitor platelets by anticoagulation protocol: Yes   Plan:  Give 2980 units bolus x 1 Initial heparin infusion at 1000 units/hr -Monitor daily HL, CBC, and any s/sx of bleeding -f/u HL @ 2000 on 7/4  Joetta Manners, PharmD, [facility] Emergency Medicine Clinical Pharmacist Phone: [phone]

## 2020-11-01 NOTE — Anesthesia Procedure Notes (Signed)
Procedure Name: Intubation Date/Time: 11/04/2020 3:07 PM Performed by: Rande Brunt, CRNA Pre-anesthesia Checklist: Patient identified, Emergency Drugs available, Suction available and Patient being monitored Patient Re-evaluated:Patient Re-evaluated prior to induction Oxygen Delivery Method: Circle System Utilized Preoxygenation: Pre-oxygenation with 100% oxygen Induction Type: IV induction, Cricoid Pressure applied and Rapid sequence Ventilation: Mask ventilation without difficulty Laryngoscope Size: Mac and 3 Grade View: Grade I Tube type: Oral Number of attempts: 1 Airway Equipment and Method: Stylet and Oral airway Placement Confirmation: ETT inserted through vocal cords under direct vision, positive ETCO2 and breath sounds checked- equal and bilateral Secured at: 22 cm Tube secured with: Tape Dental Injury: Teeth and Oropharynx as per pre-operative assessment

## 2020-11-01 NOTE — H&P (Signed)
[facility] [place]  Patient name: Gary [name] MRN: [id number] DOB: Jan 14, 1956 Sex: male   REQUESTING PROVIDER:    ER   REASON FOR CONSULT:    Ischemic left leg  HISTORY OF PRESENT ILLNESS:   Gary [name] is a 65 y.o. 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  He is also undergone a right below-knee amputation in 2017.  He presented with a infected left second toe.  He underwent angiography.  I felt it was best to proceed with bypass so he went for cardiac clearance.  He was deemed to be high risk.  Therefore several days ago he underwent angiography and atherectomy and stenting of his left SFA and popliteal as well as laser atherectomy of the popliteal, tibioperoneal trunk and posterior tibial artery with balloon angioplasty.  He was recently discharged however returned to the emergency department today with a 2-day history of pain and inability to walk at his foot.   The patient suffers from diabetes.  He is medically managed for hypertension.  He takes a statin for hypercholesterolemia.  He continues to smoke.  He is on.  PAST MEDICAL HISTORY    Past Medical History:  Diagnosis Date   Blurred vision    Chronic kidney disease    sees [facility]   Constipation    Constipation    Diabetes mellitus    type 2   GERD (gastroesophageal reflux disease)    High cholesterol    Hypertension    Insomnia    Peripheral artery disease (HCC)    Port-wine stain of face    Left V1 distribution, including upper eyelid   Sensorineural hearing loss    left ear   Vitamin D deficiency      FAMILY HISTORY   Family History  Problem Relation Age of Onset   Diabetes Mother    Hypertension Mother    Cancer Father    Colon cancer Neg Hx     SOCIAL HISTORY:   Social History   Socioeconomic History   Marital status: Married    Spouse name: Not on  file   Number of children: Not on file   Years of education: Not on file   Highest education level: Not on file  Occupational History   Not on file  Tobacco Use   Smoking status: Every Day    Packs/day: 2.00    Years: 50.00    Pack years: 100.00    Types: Cigarettes   Smokeless tobacco: Never   Tobacco comments:    4 cigs per day 09/21/20//lmr  Vaping Use   Vaping Use: Never used  Substance and Sexual Activity   Alcohol use: No    Alcohol/week: 0.0 standard drinks   Drug use: No   Sexual activity: Not on file  Other Topics Concern   Not on file  Social History Narrative   Not on file   Social Determinants of Health   Financial Resource Strain: Not on file  Food Insecurity: Not on file  Transportation Needs: Not on file  Physical Activity: Not on file  Stress: Not on file  Social Connections: Not on file  Intimate Partner Violence: Not on file    ALLERGIES:    Allergies  Allergen Reactions   Oxycodone-Acetaminophen Rash and Other (See Comments)    Macular papular rash after two days on Oxy/APAP resolved with d/c of drug.  CURRENT MEDICATIONS:    Current Facility-Administered Medications  Medication Dose Route Frequency Provider Last Rate Last Admin   heparin ADULT infusion 100 units/mL (25000 units/244mL)  1,000 Units/hr Intravenous Continuous Quintella Reichert, MD 10 mL/hr at 11/17/2020 1256 1,000 Units/hr at 11/19/2020 1256   Current Outpatient Medications  Medication Sig Dispense Refill   acetaminophen (TYLENOL) 500 MG tablet Take 1,000 mg by mouth 3 (three) times daily as needed for mild pain.     albuterol (VENTOLIN HFA) 108 (90 Base) MCG/ACT inhaler Inhale 2 puffs into the lungs every 6 (six) hours as needed for wheezing or shortness of breath.     aspirin 81 MG chewable tablet Chew 81 mg by mouth daily.     Benzocaine 20 % OINT Place 1 application rectally 5 (five) times daily as needed (hemmorhoids/anal fissure).     bisacodyl (DULCOLAX) 5 MG EC tablet  Take 5 mg by mouth daily.     ciprofloxacin (CIPRO) 250 MG tablet Take 250 mg by mouth daily.     clopidogrel (PLAVIX) 75 MG tablet Take 1 tablet (75 mg total) by mouth daily. 30 tablet 1   Dulaglutide (TRULICITY) 3 ZJ/6.9CV SOPN Inject 3 mg into the skin once a week.     Evolocumab with Infusor ([place]) 420 MG/3.5ML SOCT Inject 420 mg into the skin every 30 (thirty) days.     famotidine (PEPCID) 40 MG tablet Take 40 mg by mouth at bedtime.     furosemide (LASIX) 80 MG tablet Take 80-120 mg by mouth See admin instructions. Take 1.5 tablets (120 mg) by mouth in the morning & take 1 tablet (80 mg) by mouth at noon for blood pressure/fluid/potassium     metoCLOPramide (REGLAN) 5 MG tablet Take 5 mg by mouth 2 (two) times daily as needed for nausea or vomiting.     multivitamin (RENA-VIT) TABS tablet Take 1 tablet by mouth daily.     Nitroglycerin (RECTIV) 0.4 % OINT Place 1 application rectally every 12 (twelve) hours as needed (hemmorhoids/anal fissure).     Nutritional Supplements (FEEDING SUPPLEMENT, NEPRO CARB STEADY,) LIQD Take 240 mLs by mouth daily.     polyethylene glycol (MIRALAX / GLYCOLAX) 17 g packet Take 17 g by mouth daily.     rosuvastatin (CRESTOR) 20 MG tablet Take 20 mg by mouth daily.      senna-docusate (SENOKOT-S) 8.6-50 MG tablet Take 2 tablets by mouth in the morning and at bedtime.     umeclidinium-vilanterol (ANORO ELLIPTA) 62.5-25 MCG/INH AEPB Inhale 1 puff into the lungs daily.     Vitamin D, Ergocalciferol, (DRISDOL) 1.25 MG (50000 UNIT) CAPS capsule Take 50,000 Units by mouth every 30 (thirty) days.      REVIEW OF SYSTEMS:   [X]  denotes positive finding, [ ]  denotes negative finding Cardiac  Comments:  Chest pain or chest pressure:    Shortness of breath upon exertion:    Short of breath when lying flat:    Irregular heart rhythm:        Vascular    Pain in calf, thigh, or hip brought on by ambulation:    Pain in feet at night that wakes you  up from your sleep:  x   Blood clot in your veins:    Leg swelling:         Pulmonary    Oxygen at home:    Productive cough:     Wheezing:         Neurologic    Sudden  weakness in arms or legs:     Sudden numbness in arms or legs:     Sudden onset of difficulty speaking or slurred speech:    Temporary loss of vision in one eye:     Problems with dizziness:         Gastrointestinal    Blood in stool:      Vomited blood:         Genitourinary    Burning when urinating:     Blood in urine:        Psychiatric    Major depression:         Hematologic    Bleeding problems:    Problems with blood clotting too easily:        Skin    Rashes or ulcers:        Constitutional    Fever or chills:     PHYSICAL EXAM:   Vitals:   11/12/2020 1157 11/05/2020 1200 11/22/2020 1245 10/30/2020 1300  BP: 103/86 (!) 101/45 106/69 108/73  Pulse: 94  83 75  Resp: (!) 26 12 (!) 23 (!) 24  Temp:      SpO2: 100%  99% 100%    GENERAL: The patient is a well-nourished male, in no acute distress. The vital signs are documented above. CARDIAC: There is a regular rate and rhythm.  VASCULAR: Palpable femoral pulses, pedal pulses are not palpable PULMONARY: Nonlabored respirations ABDOMEN: Soft and non-tender with normal pitched bowel sounds.  MUSCULOSKELETAL: There are no major deformities or cyanosis. NEUROLOGIC: Decreased motor and sensory function to the left foot which is cool SKIN: There are no ulcers or rashes noted. PSYCHIATRIC: The patient has a normal affect.  STUDIES:   I have reviewed his ultrasound studies that show occlusion of his previously placed stents  ASSESSMENT and PLAN   Ischemic left leg: I discussed with the patient that no matter what we do he is at high risk for amputation.  I propose 2 options, the first would be primary amputation, the second would be bypass graft to the left leg for limb salvage understanding that this has a high likelihood of failure given his  underlying disease and recent failed intervention.  In addition, he is high risk from a cardiac standpoint so he is at risk for cardiac complications.  Despite this, the patient does not want to lose his leg and wants to do everything possible.  Therefore we discussed proceeding emergently for revascularization.  All questions were answered.   Leia Alf, MD, FACS [facility] [phone] Pager [phone]

## 2020-11-01 NOTE — Progress Notes (Signed)
[place] for heparin Indication:  limb ischemia  Allergies  Allergen Reactions   Oxycodone-Acetaminophen Rash and Other (See Comments)    Macular papular rash after two days on Oxy/APAP resolved with d/c of drug.    Patient Measurements: Height: 5\' 6"  (167.6 cm) Weight: 59.6 kg (131 lb 6.3 oz) IBW/kg (Calculated) : 63.8 Heparin Dosing Weight: 59.6 kg  Estimated Creatinine Clearance: 16.1 mL/min (A) (by C-G formula based on SCr of 3.91 mg/dL (H)).  Medical History:  Assessment: 65 yo M with cold extremity s/p R BKA. Hx of PVD, chronic ischemia of LLE. Recently patient underwent left common femoral, SFA and profundofemoral endarterectomy below-knee and stenting of SFA. L foot is cool to touch and distal 1/3. Faint DP pulse.  Initial heparin level came back subtherapeutic at 0.16, on 1000 units/hr. Of note, heparin was held for 1 hour this afternoon during procedure - level ~4 hours after restart. No s/sx of bleeding or infusion issues.   Given pause and low result, will increase slightly without bolus and get level 8 hours after rate adjustment.   Goal of Therapy:  Heparin level 0.3-0.7 units/ml Monitor platelets by anticoagulation protocol: Yes   Plan:  Increase heparin infusion to 1100 units/hr -Order heparin level in 8 hours  -Monitor daily HL, CBC, and any s/sx of bleeding  Antonietta Jewel, PharmD, [place] [facility]  Phone: [phone] 10/30/2020 9:10 PM  Please check AMION for all [place] phone numbers After 10:00 PM, call [employer] [phone]

## 2020-11-01 NOTE — Consult Note (Signed)
Reason for Consult: ESRD Referring Physician:  Dr. Wynetta Fines  Chief Complaint: left leg pain  Dialysis Orders: [facility]WF - [facility]  3hrs 92min, BFR 400, DFR AF 1.5,  EDW 57.5kg (last time reached was on 6/17), 2K/ 2Ca   Access: LU AVF  Heparin 2200 unit bolus Mircera 30 mcg q4wks - last 10/27/20 Venofer 100mg  qHD x10 (1 completed) Hectorol 74mcg IV qHD    Assessment/Plan:  PVD w/non healing R toe ulceration - recent angiography on 6/14 revealed popliteal and peroneal occlusion. Angiography 6/30 with emergent L common femoral, SFA and profunda femoral endarterectomy with bovine pericardial patch angioplasty. Per [facility]. Foot is cold on exam.  ESRD -  on HD MWF.  Full HD treatment today and given events today would like to hold off on dialysis tomorrow unless absolutely necessary (dyspnea or issues with oxygenation or refractory hyperkalemia). Planning on next HD Wednesday via yesterday per regular schedule using LU BCF.   Hypertension/volume  - BP soft today.  Off all meds.  Updated outpatient med list. Will have the neo converted to levophed to limit fluids.  Anemia of CKD - Hgb 9.9.  ESA recently dosed. Monitor.  Secondary Hyperparathyroidism -  Ca ok. Check phos. Continue VDRA and binders.  Nutrition - Renal diet w/fluid restrictions. COPD - on 2L O2 at home. 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 male PAD with recurrent left lower leg ischemia and right BKA, HFrEF, HTN HLD, COPD, s/p recent left leg intervention with endarterectomy of left SFA and profundofemoral artery with below knee stenting on 6/30. He also has ESRD and completed his treatment today leaving at a weight of 60kg w/ 2 L net UF tolerated but complaining of pain below the left knee and left foot noted to be cold. In the ED there was decreased blood flow to the left foot where he was also evaluated by vascular surgery. He  does not remember if he has taken the aspirin and plavix since d/c on 7/2. He was taken to the OR for limb salvage with a fem-pop but developed shock after induction requiring epinephrine and phenylephrine. MAP dropped from 70's to as low as 30's. Initially on BiPAP but eventually on [place] and at time of exam 94% on 4L.  ROS Pertinent items are noted in HPI.  Chemistry and CBC: Creat  Date/Time Value Ref Range Status  09/26/2012 03:36 PM 0.86 0.50 - 1.35 mg/dL Final   Creatinine, Ser  Date/Time Value Ref Range Status  11/27/2020 11:30 AM 3.28 (H) 0.61 - 1.24 mg/dL Final  10/29/2020 05:30 AM 8.36 (H) 0.61 - 1.24 mg/dL Final  10/28/2020 07:30 AM 7.30 (H) 0.61 - 1.24 mg/dL Final  10/12/2020 11:02 AM 6.90 (H) 0.61 - 1.24 mg/dL Final  08/24/2020 05:49 AM 5.07 (H) 0.61 - 1.24 mg/dL Final    Comment:    DELTA CHECK NOTED  08/23/2020 03:56 AM 8.85 (H) 0.61 - 1.24 mg/dL Final  08/22/2020 03:20 AM 6.79 (H) 0.61 - 1.24 mg/dL Final  08/21/2020 12:48 AM 5.16 (H) 0.61 - 1.24 mg/dL Final  08/20/2020 02:34 AM 7.24 (H) 0.61 - 1.24 mg/dL Final  08/19/2020 03:45 AM 5.05 (H) 0.61 - 1.24 mg/dL Final  08/18/2020 12:43 AM 7.33 (H) 0.61 - 1.24 mg/dL Final    Comment:    DELTA CHECK NOTED DIALYSIS   08/17/2020 05:40 PM 12.18 (H) 0.61 - 1.24 mg/dL  Final  08/17/2020 01:41 PM 11.89 (H) 0.61 - 1.24 mg/dL Final  01/20/2020 11:46 AM 8.40 (H) 0.61 - 1.24 mg/dL Final  06/12/2018 10:56 AM 9.41 (H) 0.61 - 1.24 mg/dL Final  01/28/2016 12:23 PM 2.11 (H) 0.61 - 1.24 mg/dL Final  10/04/2012 05:00 AM 0.87 0.50 - 1.35 mg/dL Final  10/03/2012 03:35 PM 0.81 0.50 - 1.35 mg/dL Final  11/11/2009 07:53 PM 0.89 0.40 - 1.50 mg/dL Final  06/15/2009 08:59 PM 0.81 0.40 - 1.50 mg/dL Final  10/18/2008 04:30 AM 0.72 0.4 - 1.5 mg/dL Final  10/17/2008 03:45 AM 0.75 0.4 - 1.5 mg/dL Final  10/16/2008 02:45 AM 0.70 0.4 - 1.5 mg/dL Final  10/15/2008 06:04 PM 0.61 0.4 - 1.5 mg/dL Final  10/15/2008 07:46 AM 0.65 0.4 - 1.5 mg/dL Final   01/27/2008 06:20 AM 0.96  Final  01/26/2008 11:20 PM 0.97  Final   Recent Labs  Lab 10/28/20 0730 10/28/20 1551 10/29/20 0530 11/02/2020 1130 11/11/2020 1638  NA 134* 136 136 134* 134*  K 5.5* 3.6 4.3 3.6 4.1  CL 94*  --  95* 92*  --   CO2  --   --  26 26  --   GLUCOSE 173*  --  222* 135*  --   BUN 65*  --  51* 16  --   CREATININE 7.30*  --  8.36* 3.28*  --   CALCIUM  --   --  8.5* 8.1*  --    Recent Labs  Lab 10/29/20 0530 10/29/20 0848 11/21/2020 1130 11/18/2020 1638  WBC 12.3* 13.3* 11.5*  --   NEUTROABS 9.9*  --  8.9*  --   HGB 8.7* 9.1* 8.5* 9.9*  HCT 28.2* 30.1* 26.7* 29.0*  MCV 76.8* 78.4* 75.6*  --   PLT 177 173 181  --    Liver Function Tests: No results for input(s): AST, ALT, ALKPHOS, BILITOT, PROT, ALBUMIN in the last 168 hours. No results for input(s): LIPASE, AMYLASE in the last 168 hours. No results for input(s): AMMONIA in the last 168 hours. Cardiac Enzymes: No results for input(s): CKTOTAL, CKMB, CKMBINDEX, TROPONINI in the last 168 hours. Iron Studies: No results for input(s): IRON, TIBC, TRANSFERRIN, FERRITIN in the last 72 hours. PT/INR: @LABRCNTIP (inr:5)  Xrays/Other Studies: ) Results for orders placed or performed during the hospital encounter of 11/26/2020 (from the past 48 hour(s))  Resp Panel by RT-PCR (Flu A&B, Covid) Nasopharyngeal Swab     Status: None   Collection Time: 11/04/2020 11:22 AM   Specimen: Nasopharyngeal Swab; Nasopharyngeal(NP) swabs in vial transport medium  Result Value Ref Range   SARS Coronavirus 2 by RT PCR NEGATIVE NEGATIVE    Comment: (NOTE) SARS-CoV-2 target nucleic acids are NOT DETECTED.  The SARS-CoV-2 RNA is generally detectable in upper respiratory specimens during the acute phase of infection. The lowest concentration of SARS-CoV-2 viral copies this assay can detect is 138 copies/mL. A negative result does not preclude SARS-Cov-2 infection and should not be used as the sole basis for treatment or other patient  management decisions. A negative result may occur with  improper specimen collection/handling, submission of specimen other than nasopharyngeal swab, presence of viral mutation(s) within the areas targeted by this assay, and inadequate number of viral copies(<138 copies/mL). A negative result must be combined with clinical observations, patient history, and epidemiological information. The expected result is Negative.  Fact Sheet for Patients:  EntrepreneurPulse.com.au  Fact Sheet for Healthcare Providers:  IncredibleEmployment.be  This test is no t yet approved or cleared  by the [place] and  has been authorized for detection and/or diagnosis of SARS-CoV-2 by FDA under an Emergency Use Authorization (EUA). This EUA will remain  in effect (meaning this test can be used) for the duration of the COVID-19 declaration under Section 564(b)(1) of the Act, 21 U.S.C.section 360bbb-3(b)(1), unless the authorization is terminated  or revoked sooner.       Influenza A by PCR NEGATIVE NEGATIVE   Influenza B by PCR NEGATIVE NEGATIVE    Comment: (NOTE) The Xpert Xpress SARS-CoV-2/FLU/RSV plus assay is intended as an aid in the diagnosis of influenza from Nasopharyngeal swab specimens and should not be used as a sole basis for treatment. Nasal washings and aspirates are unacceptable for Xpert Xpress SARS-CoV-2/FLU/RSV testing.  Fact Sheet for Patients: EntrepreneurPulse.com.au  Fact Sheet for Healthcare Providers: IncredibleEmployment.be  This test is not yet approved or cleared by the [place] FDA and has been authorized for detection and/or diagnosis of SARS-CoV-2 by FDA under an Emergency Use Authorization (EUA). This EUA will remain in effect (meaning this test can be used) for the duration of the COVID-19 declaration under Section 564(b)(1) of the Act, 21 U.S.C. section 360bbb-3(b)(1), unless the  authorization is terminated or revoked.  Performed at [facility], [place] [address] [place], [place] [address]   Basic metabolic panel     Status: Abnormal   Collection Time: 11/23/2020 11:30 AM  Result Value Ref Range   Sodium 134 (L) 135 - 145 mmol/L   Potassium 3.6 3.5 - 5.1 mmol/L   Chloride 92 (L) 98 - 111 mmol/L   CO2 26 22 - 32 mmol/L   Glucose, Bld 135 (H) 70 - 99 mg/dL    Comment: Glucose reference range applies only to samples taken after fasting for at least 8 hours.   BUN 16 8 - 23 mg/dL   Creatinine, Ser 3.28 (H) 0.61 - 1.24 mg/dL   Calcium 8.1 (L) 8.9 - 10.3 mg/dL   GFR, Estimated 20 (L) >60 mL/min    Comment: (NOTE) Calculated using the CKD-EPI Creatinine Equation (2021)    Anion gap 16 (H) 5 - 15    Comment: Performed at [facility] [address], [place], [place] [address]  CBC with Differential     Status: Abnormal   Collection Time: 11/12/2020 11:30 AM  Result Value Ref Range   WBC 11.5 (H) 4.0 - 10.5 K/uL   RBC 3.53 (L) 4.22 - 5.81 MIL/uL   Hemoglobin 8.5 (L) 13.0 - 17.0 g/dL    Comment: Reticulocyte Hemoglobin testing may be clinically indicated, consider ordering this additional test [id number]    HCT 26.7 (L) 39.0 - 52.0 %   MCV 75.6 (L) 80.0 - 100.0 fL   MCH 24.1 (L) 26.0 - 34.0 pg   MCHC 31.8 30.0 - 36.0 g/dL   RDW 17.7 (H) 11.5 - 15.5 %   Platelets 181 150 - 400 K/uL   nRBC 0.3 (H) 0.0 - 0.2 %   Neutrophils Relative % 76 %   Neutro Abs 8.9 (H) 1.7 - 7.7 K/uL   Lymphocytes Relative 14 %   Lymphs Abs 1.6 0.7 - 4.0 K/uL   Monocytes Relative 9 %   Monocytes Absolute 1.0 0.1 - 1.0 K/uL   Eosinophils Relative 0 %   Eosinophils Absolute 0.0 0.0 - 0.5 K/uL   Basophils Relative 0 %   Basophils Absolute 0.0 0.0 - 0.1 K/uL   Immature Granulocytes 1 %   Abs Immature  Granulocytes 0.06 0.00 - 0.07 K/uL    Comment: Performed at [facility], [place] [address] [place], [place] [address]  Protime-INR     Status: Abnormal   Collection Time:  11/06/2020 11:30 AM  Result Value Ref Range   Prothrombin Time 18.8 (H) 11.4 - 15.2 seconds   INR 1.6 (H) 0.8 - 1.2    Comment: (NOTE) INR goal varies based on device and disease states. Performed at [facility], [place] [address], [place], [place] [address]   APTT     Status: Abnormal   Collection Time: 11/10/2020 11:54 AM  Result Value Ref Range   aPTT 39 (H) 24 - 36 seconds    Comment:        IF BASELINE aPTT IS ELEVATED, SUGGEST PATIENT RISK ASSESSMENT BE USED TO DETERMINE APPROPRIATE ANTICOAGULANT THERAPY. Performed at [facility], [place] [address], [place], [place] [address]   Glucose, capillary     Status: Abnormal   Collection Time: 10/31/2020  2:39 PM  Result Value Ref Range   Glucose-Capillary 111 (H) 70 - 99 mg/dL    Comment: Glucose reference range applies only to samples taken after fasting for at least 8 hours.  Glucose, capillary     Status: None   Collection Time: 11/13/2020  4:36 PM  Result Value Ref Range   Glucose-Capillary 77 70 - 99 mg/dL    Comment: Glucose reference range applies only to samples taken after fasting for at least 8 hours.  I-STAT 7, (LYTES, BLD GAS, ICA, H+H)     Status: Abnormal   Collection Time: 11/17/2020  4:38 PM  Result Value Ref Range   pH, Arterial 7.410 7.350 - 7.450   pCO2 arterial 27.5 (L) 32.0 - 48.0 mmHg   pO2, Arterial 188 (H) 83.0 - 108.0 mmHg   Bicarbonate 17.6 (L) 20.0 - 28.0 mmol/L   TCO2 19 (L) 22 - 32 mmol/L   O2 Saturation 100.0 %   Acid-base deficit 6.0 (H) 0.0 - 2.0 mmol/L   Sodium 134 (L) 135 - 145 mmol/L   Potassium 4.1 3.5 - 5.1 mmol/L   Calcium, Ion 0.91 (L) 1.15 - 1.40 mmol/L   HCT 29.0 (L) 39.0 - 52.0 %   Hemoglobin 9.9 (L) 13.0 - 17.0 g/dL   Patient temperature 36.0 C    Sample type ARTERIAL    DG Chest 1 View  Result Date: 11/25/2020 CLINICAL DATA:  Hypotension.  Hypoxia. EXAM: CHEST  1 VIEW COMPARISON:  10/29/2020 FINDINGS: Cardiomegaly remains stable. Aortic atherosclerotic calcification noted. Both  lungs are clear. No evidence of pneumothorax. Linear lucencies are seen in the superior mediastinum. This may represent air within the esophagus, however pneumomediastinum cannot be excluded. IMPRESSION: Stable cardiomegaly. No active lung disease. Linear lucencies in superior mediastinum may represent air within the esophagus, however pneumomediastinum cannot be excluded. Electronically Signed   By: Marlaine Hind M.D.   On: 11/25/2020 17:27   VAS [place] LOWER EXTREMITY ARTERIAL DUPLEX  Result Date: 11/21/2020 LOWER EXTREMITY ARTERIAL DUPLEX STUDY Patient Name:  CONLEY DELISLE [facility]  Date of Exam:   11/06/2020 Medical Rec #: [id number]       Accession #:    [id number] Date of Birth: 02/25/1956      Patient Gender: M Patient Age:   64Y Exam Location:  [facility] Procedure:      VAS [place] LOWER EXTREMITY ARTERIAL DUPLEX Referring Phys: [id number] [place] --------------------------------------------------------------------------------  Indications: Peripheral artery disease Worsening left lower extremity  pain and              cold left foot. High Risk Factors: Hypertension, hyperlipidemia, Diabetes. 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 ESRD.  Vascular Interventions: Left common femoral, SFA and profundofemoral                         endarterectomy below-knee and stenting of SFA 10/28/20.                         Right BKA. Current ABI:            N/A Comparison Study: No prior study on file Performing Technologist: Sharion Dove RVS  Examination Guidelines: A complete evaluation includes B-mode imaging, spectral Doppler, color Doppler, and power Doppler as needed of all accessible portions of each vessel. Bilateral testing is considered an integral part of a complete examination. Limited examinations for reoccurring indications may be performed as noted.   +--------+--------+-----+--------+--------+--------+ LEFT    PSV cm/sRatioStenosisWaveformComments  +--------+--------+-----+--------+--------+--------+ CFA [id number]                                    +--------+--------+-----+--------+--------+--------+  Summary: Left: SFA and popliteal stents appear occluded. No Dopplerable flow in the posterior tibial, peroneal, or anterior tibial arteries.  See table(s) above for measurements and observations. Electronically signed by Harold Barban MD on 11/17/2020 at 2:28:03 PM.    Final     PMH:   Past Medical History:  Diagnosis Date   Blurred vision    Chronic kidney disease    sees [facility]   Constipation    Constipation    Diabetes mellitus    type 2   GERD (gastroesophageal reflux disease)    High cholesterol    Hypertension    Insomnia    Peripheral artery disease (HCC)    Port-wine stain of face    Left V1 distribution, including upper eyelid   Sensorineural hearing loss    left ear   Vitamin D deficiency     PSH:   Past Surgical History:  Procedure Laterality Date   A/V FISTULAGRAM Left 01/20/2020   Procedure: A/V FISTULAGRAM;  Surgeon: Serafina Mitchell, MD;  Location: [facility];  Service: Cardiovascular;  Laterality: Left;   ABDOMINAL AORTOGRAM W/LOWER EXTREMITY N/A 10/12/2020   Procedure: ABDOMINAL AORTOGRAM W/LOWER EXTREMITY;  Surgeon: Serafina Mitchell, MD;  Location: [facility];  Service: Cardiovascular;  Laterality: N/A;   AMPUTATION Right 07/17/2011   AMPUTATION Left 10/03/2012   Procedure: AMPUTATION DIGIT;  Surgeon: Serafina Mitchell, MD;  Location: [facility];  Service: Vascular;  Laterality: Left;  Amputation of left Great  toe   AV FISTULA PLACEMENT Left 08/30/2017   Procedure: CREATION OF LEFT BRACHIOCEPHALIC ARTERIOVENOUS FISTULA;  Surgeon: Serafina Mitchell, MD;  Location: [facility];  Service: Vascular;  Laterality: Left;   COLONOSCOPY     ENDARTERECTOMY FEMORAL Left 10/28/2020   Procedure: ENDARTERECTOMY COMMON FEMORAL ARTERY;  Surgeon: Waynetta Sandy, MD;  Location: [facility];  Service: Vascular;   Laterality: Left;   LOWER EXTREMITY ANGIOGRAM Left 10/28/2020   Procedure: LEFT LOWER EXTREMITY  ANGIOGRAM WITH BALLOON ANGIOPLASTY AND SUPERIFICIAL FEMORAL ARTERY STENT PLACEMENT;  Surgeon: Waynetta Sandy, MD;  Location: [facility];  Service: Vascular;  Laterality: Left;   LOWER EXTREMITY ANGIOGRAPHY Left 10/28/2020   Procedure: Lower  Extremity Angiography;  Surgeon: Waynetta Sandy, MD;  Location: [facility];  Service: Cardiovascular;  Laterality: Left;   PATCH ANGIOPLASTY Left 10/28/2020   Procedure: PATCH ANGIOPLASTY OF LEFT COMMON FEMORAL ARTERY;  Surgeon: Waynetta Sandy, MD;  Location: [facility];  Service: Vascular;  Laterality: Left;   PERIPHERAL ARTERIAL STENT GRAFT N/A 2010   PERIPHERAL VASCULAR ATHERECTOMY Left 10/28/2020   Procedure: PERIPHERAL VASCULAR ATHERECTOMY;  Surgeon: Waynetta Sandy, MD;  Location: [facility];  Service: Cardiovascular;  Laterality: Left;  femoral/popliteal;  and posterior tibial   PERIPHERAL VASCULAR BALLOON ANGIOPLASTY Left 10/28/2020   Procedure: PERIPHERAL VASCULAR BALLOON ANGIOPLASTY;  Surgeon: Waynetta Sandy, MD;  Location: [facility];  Service: Cardiovascular;  Laterality: Left;  PT/ SFA   PERIPHERAL VASCULAR INTERVENTION Left 10/28/2020   Procedure: PERIPHERAL VASCULAR INTERVENTION;  Surgeon: Waynetta Sandy, MD;  Location: [facility];  Service: Cardiovascular;  Laterality: Left;  popliteal   STUMP REVISION Right 01/28/2016   Procedure: Revision Right Below Knee Amputation;  Surgeon: Newt Minion, MD;  Location: [facility];  Service: Orthopedics;  Laterality: Right;    Allergies:  Allergies  Allergen Reactions   Oxycodone-Acetaminophen Rash and Other (See Comments)    Macular papular rash after two days on Oxy/APAP resolved with d/c of drug.    Medications:   Prior to Admission medications   Medication Sig Start Date End Date Taking? Authorizing Provider  acetaminophen  (TYLENOL) 500 MG tablet Take 1,000 mg by mouth 3 (three) times daily as needed for mild pain.    [provider]  albuterol (VENTOLIN HFA) 108 (90 Base) MCG/ACT inhaler Inhale 2 puffs into the lungs every 6 (six) hours as needed for wheezing or shortness of breath.    [provider]  aspirin 81 MG chewable tablet Chew 81 mg by mouth daily.    [provider]  Benzocaine 20 % OINT Place 1 application rectally 5 (five) times daily as needed (hemmorhoids/anal fissure).    [provider]  bisacodyl (DULCOLAX) 5 MG EC tablet Take 5 mg by mouth daily.    [provider]  ciprofloxacin (CIPRO) 250 MG tablet Take 250 mg by mouth daily.    [provider]  clopidogrel (PLAVIX) 75 MG tablet Take 1 tablet (75 mg total) by mouth daily. 10/31/20   Hosie Poisson, MD  Dulaglutide (TRULICITY) 3 HQ/4.6NG SOPN Inject 3 mg into the skin once a week.    [provider]  Evolocumab with Infusor ([place]) 420 MG/3.5ML SOCT Inject 420 mg into the skin every 30 (thirty) days.    [provider]  famotidine (PEPCID) 40 MG tablet Take 40 mg by mouth at bedtime.    [provider]  furosemide (LASIX) 80 MG tablet Take 80-120 mg by mouth See admin instructions. Take 1.5 tablets (120 mg) by mouth in the morning & take 1 tablet (80 mg) by mouth at noon for blood pressure/fluid/potassium    [provider]  metoCLOPramide (REGLAN) 5 MG tablet Take 5 mg by mouth 2 (two) times daily as needed for nausea or vomiting.    [provider]  multivitamin (RENA-VIT) TABS tablet Take 1 tablet by mouth daily.    [provider]  Nitroglycerin (RECTIV) 0.4 % OINT Place 1 application rectally every 12 (twelve) hours as needed (hemmorhoids/anal fissure).    [provider]  Nutritional Supplements (FEEDING SUPPLEMENT, NEPRO CARB STEADY,) LIQD Take 240 mLs by mouth  daily.    [provider]  polyethylene  glycol (MIRALAX / GLYCOLAX) 17 g packet Take 17 g by mouth daily.    [provider]  rosuvastatin (CRESTOR) 20 MG tablet Take 20 mg by mouth daily.     [provider]  senna-docusate (SENOKOT-S) 8.6-50 MG tablet Take 2 tablets by mouth in the morning and at bedtime.    [provider]  umeclidinium-vilanterol (ANORO ELLIPTA) 62.5-25 MCG/INH AEPB Inhale 1 puff into the lungs daily.    [provider]  Vitamin D, Ergocalciferol, (DRISDOL) 1.25 MG (50000 UNIT) CAPS capsule Take 50,000 Units by mouth every 30 (thirty) days.    [provider]    Discontinued Meds:   Medications Discontinued During This Encounter  Medication Reason   heparin 6000 units / NS 500 mL irrigation Patient Discharge   0.9 % irrigation (POUR BTL) Patient Discharge   fentaNYL (SUBLIMAZE) injection 25-50 mcg    EPINEPHrine (ADRENALIN) 5 mg in NS 250 mL (0.02 mg/mL) premix infusion    insulin aspart (novoLOG) injection 0-6 Units     Social History:  reports that he has been smoking cigarettes. He has a 100.00 pack-year smoking history. He has never used smokeless tobacco. He reports that he does not drink alcohol and does not use drugs.  Family History:   Family History  Problem Relation Age of Onset   Diabetes Mother    Hypertension Mother    Cancer Father    Colon cancer Neg Hx     Blood pressure 122/81, pulse (!) 40, temperature (!) 97.5 F (36.4 C), resp. rate (!) 24, height 5\' 6"  (1.676 m), weight 59.6 kg, SpO2 94 %. Physical Exam: General:well appearing male in NAD Heart:RRR, no mrg Lungs:CTA on left but decreased BS on right, nml WOB on 4L via [place] Abdomen:soft, NTND Extremities:trace LLE edema, R BKA no stump edema Dialysis Access: lt BCF with good bruit, bandages still on, no bleeding      Ellyana Crigler, Hunt Oris, MD 11/10/2020, 6:19 PM

## 2020-11-01 NOTE — Progress Notes (Signed)
VASCULAR LAB    Left lower extremity arterial duplex has been performed.  See CV proc for preliminary results.  Called Dr. Trula Slade with results   Gary [name], Gary [name], RVT 11/20/2020, 12:34 PM

## 2020-11-01 NOTE — Progress Notes (Addendum)
Received call from Dr. Arita Miss vascular surgery, patient became hypotensive in the OR.  Procedure aborted.  And patient also developed hypoxia.  Oral record showed patient received 400 mL of normal saline and 500 amount of albumin.  Went to see patient in PACU, patient hypotensive on epinephrine and vasopressin, off BiPAP but remained hypoxic.  On physical exam, lung sounds diminished right >left.  Ordered STAT chest xray and discussed with ICU team, patient like developed decompensated CHF.  Check EKG and troponin.  ICU team will take over.  Talked to Nephro Dr. Augustin Coupe, who agreed to see patient soon.   Ordered EKG and Trop to rule out MI given sudden decompensation of CHF.

## 2020-11-01 NOTE — Consult Note (Addendum)
Cardiology Admission History and Physical:   Patient ID: MEKO MASTERSON MRN: [id number]; DOB: 1955/11/24   Admission date: 11/23/2020  PCP:  Janifer Adie, MD   [facility] Providers Cardiologist:  Werner Lean, MD       Chief Complaint:  Shock in the setting of critical limb ischemia  Patient Profile:   Gary [name] is a 65 y.o. male with PAD with prior BKA, HFrEF HFrEF 25-30% likely ischemic in nature, HTN and DM, ESRD Tobacco use, LBBB, HLD & Aortic Atherosclerosis and CAC who is being seen 11/23/2020 for the evaluation of shock.  History of Present Illness:   Mr. Bergerson notes that he had no problems with his original surgery: femoral laser atherectomy, L pop PCI complicated by dissection needing thrombectomy, and bovine pericardial patch angioplasty and stenting of the SFA, from 10/28/20.  Patient did not want to get his other leg taken off.  Unclear if he had continued hs DAPT post intervention.  This AM, chart review notes cold and painful L leg during HD.  He was taken to OR and developed shock on induction.  Initially required epi and levo in the OR.  Procedure was aborted, transiently needed BIPAP,now on [place].  Cardiology called given his multiple cardiac comorbidity; STAT Echo ordered:  Pending echo, I obtained bedside images, notable for EF < 20%, Moderate RV dysfunction, no flair MR, no VSR, no significant pericardial effusion, IVC < 2.1   Past Medical History:  Diagnosis Date   Blurred vision    Chronic kidney disease    sees [facility]   Constipation    Constipation    Diabetes mellitus    type 2   GERD (gastroesophageal reflux disease)    High cholesterol    Hypertension    Insomnia    Peripheral artery disease (HCC)    Port-wine stain of face    Left V1 distribution, including upper eyelid   Sensorineural hearing loss    left ear   Vitamin D deficiency     Past Surgical History:  Procedure Laterality Date   A/V FISTULAGRAM Left  01/20/2020   Procedure: A/V FISTULAGRAM;  Surgeon: Serafina Mitchell, MD;  Location: [place] [facility];  Service: Cardiovascular;  Laterality: Left;   ABDOMINAL AORTOGRAM W/LOWER EXTREMITY N/A 10/12/2020   Procedure: ABDOMINAL AORTOGRAM W/LOWER EXTREMITY;  Surgeon: Serafina Mitchell, MD;  Location: [place] [facility];  Service: Cardiovascular;  Laterality: N/A;   AMPUTATION Right 07/17/2011   AMPUTATION Left 10/03/2012   Procedure: AMPUTATION DIGIT;  Surgeon: Serafina Mitchell, MD;  Location: [place];  Service: Vascular;  Laterality: Left;  Amputation of left Great  toe   AV FISTULA PLACEMENT Left 08/30/2017   Procedure: CREATION OF LEFT BRACHIOCEPHALIC ARTERIOVENOUS FISTULA;  Surgeon: Serafina Mitchell, MD;  Location: [place];  Service: Vascular;  Laterality: Left;   COLONOSCOPY     ENDARTERECTOMY FEMORAL Left 10/28/2020   Procedure: ENDARTERECTOMY COMMON FEMORAL ARTERY;  Surgeon: Waynetta Sandy, MD;  Location: [place];  Service: Vascular;  Laterality: Left;   LOWER EXTREMITY ANGIOGRAM Left 10/28/2020   Procedure: LEFT LOWER EXTREMITY  ANGIOGRAM WITH BALLOON ANGIOPLASTY AND SUPERIFICIAL FEMORAL ARTERY STENT PLACEMENT;  Surgeon: Waynetta Sandy, MD;  Location: [place];  Service: Vascular;  Laterality: Left;   LOWER EXTREMITY ANGIOGRAPHY Left 10/28/2020   Procedure: Lower Extremity Angiography;  Surgeon: Waynetta Sandy, MD;  Location: [place] [facility];  Service: Cardiovascular;  Laterality: Left;   PATCH ANGIOPLASTY Left  10/28/2020   Procedure: PATCH ANGIOPLASTY OF LEFT COMMON FEMORAL ARTERY;  Surgeon: Waynetta Sandy, MD;  Location: [place];  Service: Vascular;  Laterality: Left;   PERIPHERAL ARTERIAL STENT GRAFT N/A 2010   PERIPHERAL VASCULAR ATHERECTOMY Left 10/28/2020   Procedure: PERIPHERAL VASCULAR ATHERECTOMY;  Surgeon: Waynetta Sandy, MD;  Location: [place] [facility];  Service: Cardiovascular;  Laterality: Left;  femoral/popliteal;  and posterior tibial    PERIPHERAL VASCULAR BALLOON ANGIOPLASTY Left 10/28/2020   Procedure: PERIPHERAL VASCULAR BALLOON ANGIOPLASTY;  Surgeon: Waynetta Sandy, MD;  Location: [place] [facility];  Service: Cardiovascular;  Laterality: Left;  PT/ SFA   PERIPHERAL VASCULAR INTERVENTION Left 10/28/2020   Procedure: PERIPHERAL VASCULAR INTERVENTION;  Surgeon: Waynetta Sandy, MD;  Location: [place] [facility];  Service: Cardiovascular;  Laterality: Left;  popliteal   STUMP REVISION Right 01/28/2016   Procedure: Revision Right Below Knee Amputation;  Surgeon: Newt Minion, MD;  Location: [place];  Service: Orthopedics;  Laterality: Right;     Medications Prior to Admission: Prior to Admission medications   Medication Sig Start Date End Date Taking? Authorizing Provider  acetaminophen (TYLENOL) 500 MG tablet Take 1,000 mg by mouth 3 (three) times daily as needed for mild pain.    [provider]  albuterol (VENTOLIN HFA) 108 (90 Base) MCG/ACT inhaler Inhale 2 puffs into the lungs every 6 (six) hours as needed for wheezing or shortness of breath.    [provider]  aspirin 81 MG chewable tablet Chew 81 mg by mouth daily.    [provider]  Benzocaine 20 % OINT Place 1 application rectally 5 (five) times daily as needed (hemmorhoids/anal fissure).    [provider]  bisacodyl (DULCOLAX) 5 MG EC tablet Take 5 mg by mouth daily.    [provider]  ciprofloxacin (CIPRO) 250 MG tablet Take 250 mg by mouth daily.    [provider]  clopidogrel (PLAVIX) 75 MG tablet Take 1 tablet (75 mg total) by mouth daily. 10/31/20   Hosie Poisson, MD  Dulaglutide (TRULICITY) 3 LP/3.7TK SOPN Inject 3 mg into the skin once a week.    [provider]  Evolocumab with Infusor ([place]) 420 MG/3.5ML SOCT Inject 420 mg into the skin every 30 (thirty) days.    [provider]  famotidine (PEPCID) 40 MG tablet Take 40 mg by mouth at bedtime.     [provider]  furosemide (LASIX) 80 MG tablet Take 80-120 mg by mouth See admin instructions. Take 1.5 tablets (120 mg) by mouth in the morning & take 1 tablet (80 mg) by mouth at noon for blood pressure/fluid/potassium    [provider]  metoCLOPramide (REGLAN) 5 MG tablet Take 5 mg by mouth 2 (two) times daily as needed for nausea or vomiting.    [provider]  multivitamin (RENA-VIT) TABS tablet Take 1 tablet by mouth daily.    [provider]  Nitroglycerin (RECTIV) 0.4 % OINT Place 1 application rectally every 12 (twelve) hours as needed (hemmorhoids/anal fissure).    [provider]  Nutritional Supplements (FEEDING SUPPLEMENT, NEPRO CARB STEADY,) LIQD Take 240 mLs by mouth daily.    [provider]  polyethylene glycol (MIRALAX / GLYCOLAX) 17 g packet Take 17 g by mouth daily.    [provider]  rosuvastatin (CRESTOR) 20 MG tablet Take 20 mg by mouth daily.     [provider]  senna-docusate (SENOKOT-S) 8.6-50 MG tablet Take 2  tablets by mouth in the morning and at bedtime.    [provider]  umeclidinium-vilanterol (ANORO ELLIPTA) 62.5-25 MCG/INH AEPB Inhale 1 puff into the lungs daily.    [provider]  Vitamin D, Ergocalciferol, (DRISDOL) 1.25 MG (50000 UNIT) CAPS capsule Take 50,000 Units by mouth every 30 (thirty) days.    [provider]     Allergies:    Allergies  Allergen Reactions   Oxycodone-Acetaminophen Rash and Other (See Comments)    Macular papular rash after two days on Oxy/APAP resolved with d/c of drug.    Social History:   Social History   Socioeconomic History   Marital status: Married    Spouse name: Not on file   Number of children: Not on file   Years of education: Not on file   Highest education level: Not on file  Occupational History   Not on file  Tobacco Use   Smoking status: Every Day    Packs/day: 2.00    Years: 50.00    Pack years:  100.00    Types: Cigarettes   Smokeless tobacco: Never   Tobacco comments:    4 cigs per day 09/21/20//lmr  Vaping Use   Vaping Use: Never used  Substance and Sexual Activity   Alcohol use: No    Alcohol/week: 0.0 standard drinks   Drug use: No   Sexual activity: Not on file  Other Topics Concern   Not on file  Social History Narrative   Not on file   Social Determinants of Health   Financial Resource Strain: Not on file  Food Insecurity: Not on file  Transportation Needs: Not on file  Physical Activity: Not on file  Stress: Not on file  Social Connections: Not on file  Intimate Partner Violence: Not on file    Family History:   The patient's family history includes Cancer in his father; Diabetes in his mother; Hypertension in his mother. There is no history of Colon cancer.    ROS:  Please see the history of present illness.  All other ROS reviewed and negative.     Physical Exam/Data:   Vitals:   10/29/2020 1730 11/07/2020 1740 11/09/2020 1755 11/25/2020 1810  BP:  105/69 120/71 122/81  Pulse:  91 90 (!) 40  Resp: (!) 33 (!) 25 (!) 33 (!) 24  Temp:      TempSrc:      SpO2:  94% 97% 94%  Weight:      Height:        Intake/Output Summary (Last 24 hours) at 11/24/2020 1828 Last data filed at 10/31/2020 1649 Gross per 24 hour  Intake 900 ml  Output --  Net 900 ml   Last 3 Weights 11/09/2020 10/29/2020 10/29/2020  Weight (lbs) 131 lb 6.3 oz 131 lb 6.3 oz 131 lb 6.3 oz  Weight (kg) 59.6 kg 59.6 kg 59.6 kg     Body mass index is 21.21 kg/m.  General:  Well nourished, well developed, in moderate  acute distress HEENT: normal Lymph: no adenopathy Neck: no JVD Endocrine:  No thryomegaly Vascular: R BKA site WNL L Leg is cold unable to acquire pulses Cardiac:  normal S1, S2; RRR; no murmur  Lungs:  clear to auscultation bilaterally, no wheezing, rhonchi or rales  Abd: soft, nontender, no hepatomegaly  Ext: no edema Musculoskeletal:  No deformities, BUE strength normal and  equal Neuro:  CNs 2-12 intact, no focal abnormalities noted Psych:  Normal affect    Relevant CV  Studies: Transthoracic Echocardiogram: Date: 01/13/2020 Results: Study from [facility]  The left ventricular size is normal.  There is normal left ventricular wall thickness.  Left ventricular systolic function is severely reduced.  LV ejection fraction = 25-30%.  Left ventricular filling pattern is restrictive.  The basal and mid inferior and inferolateral wall are hypokinetic.  The right ventricle is normal in size and function.  The left atrium is mildly dilated.  There is restricted motion of the posterior mitral valve leaflet with  moderate mitral regurgitation. In the setting of inferolateral wall  motion abnormality, this may represent ischemic mitral regurgitation  due to circumflex disease.  There is mild tricuspid regurgitation.  Estimated right ventricular systolic pressure is 43 mmHg.  Mild pulmonary hypertension.  IVC size was normal.  There is no pericardial effusion.  There is no comparison study available  CTPE: Date: 08/17/20 Results: Extensive Coronary Calcifications in LM, LAD, Lcx, RCA Aortic Atherosclerosis noted  NM Stress Testing : Date: 10/19/20 Results: Evidence of LAD and Lcx Infarction, unclear viability based on modality Nuclear stress EF: 13%. The left ventricular ejection fraction is severely decreased (<30%). Defect 1: There is a large defect of moderate severity present in the basal inferoseptal, basal inferior, basal inferolateral, mid inferior, mid inferolateral and apical inferior location. Defect 2: There is a small defect of moderate severity present in the apex location. Findings consistent with prior myocardial infarction. This is a high risk study.   1. Fixed perfusion defect at inferior/inferolateral walls and apex consistent with prior infarct, though there is significant extracardiac radiotracer uptake adjacent to inferior/inferolateral walls  that could be causing artifact 2. High risk study due to severe systolic dysfunction (EF 97%).  No ischemia.  Laboratory Data:  High Sensitivity Troponin:  No results for input(s): TROPONINIHS in the last 720 hours.    Chemistry Recent Labs  Lab 10/29/20 0530 11/18/2020 1130 11/12/2020 1638  NA 136 134* 134*  K 4.3 3.6 4.1  CL 95* 92*  --   CO2 26 26  --   GLUCOSE 222* 135*  --   BUN 51* 16  --   CREATININE 8.36* 3.28*  --   CALCIUM 8.5* 8.1*  --   GFRNONAA 7* 20*  --   ANIONGAP 15 16*  --     No results for input(s): PROT, ALBUMIN, AST, ALT, ALKPHOS, BILITOT in the last 168 hours. Hematology Recent Labs  Lab 10/29/20 0848 11/06/2020 1130 11/02/2020 1638  WBC 13.3* 11.5*  --   RBC 3.84* 3.53*  --   HGB 9.1* 8.5* 9.9*  HCT 30.1* 26.7* 29.0*  MCV 78.4* 75.6*  --   MCH 23.7* 24.1*  --   MCHC 30.2 31.8  --   RDW 16.6* 17.7*  --   PLT 173 181  --    BNPNo results for input(s): BNP, PROBNP in the last 168 hours.  DDimer No results for input(s): DDIMER in the last 168 hours.   Radiology/Studies:  DG Chest 1 View  Result Date: 11/16/2020 CLINICAL DATA:  Hypotension.  Hypoxia. EXAM: CHEST  1 VIEW COMPARISON:  10/29/2020 FINDINGS: Cardiomegaly remains stable. Aortic atherosclerotic calcification noted. Both lungs are clear. No evidence of pneumothorax. Linear lucencies are seen in the superior mediastinum. This may represent air within the esophagus, however pneumomediastinum cannot be excluded. IMPRESSION: Stable cardiomegaly. No active lung disease. Linear lucencies in superior mediastinum may represent air within the esophagus, however pneumomediastinum cannot be excluded. Electronically Signed   By: Myles Rosenthal.D.  On: 10/31/2020 17:27   VAS [place] LOWER EXTREMITY ARTERIAL DUPLEX  Result Date: 11/27/2020 LOWER EXTREMITY ARTERIAL DUPLEX STUDY Patient Name:  EZELL POKE [facility]  Date of Exam:   11/02/2020 Medical Rec #: [id number]       Accession #:    [id number] Date of Birth: [birth year]       Patient Gender: M Patient Age:   72Y Exam Location:  [facility] Procedure:      VAS [place] LOWER EXTREMITY ARTERIAL DUPLEX Referring Phys: [id number] ELIZABETH REES --------------------------------------------------------------------------------  Indications: Peripheral artery disease Worsening left lower extremity pain and              cold left foot. High Risk Factors: Hypertension, hyperlipidemia, Diabetes. 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 ESRD.  Vascular Interventions: Left common femoral, SFA and profundofemoral                         endarterectomy below-knee and stenting of SFA 10/28/20.                         Right BKA. Current ABI:            N/A Comparison Study: No prior study on file Performing Technologist: Sharion Dove RVS  Examination Guidelines: A complete evaluation includes B-mode imaging, spectral Doppler, color Doppler, and power Doppler as needed of all accessible portions of each vessel. Bilateral testing is considered an integral part of a complete examination. Limited examinations for reoccurring indications may be performed as noted.   +--------+--------+-----+--------+--------+--------+ LEFT    PSV cm/sRatioStenosisWaveformComments +--------+--------+-----+--------+--------+--------+ CFA Prox51                                    +--------+--------+-----+--------+--------+--------+  Summary: Left: SFA and popliteal stents appear occluded. No Dopplerable flow in the posterior tibial, peroneal, or anterior tibial arteries.  See table(s) above for measurements and observations. Electronically signed by Harold Barban MD on 11/08/2020 at 2:28:03 PM.    Final      Assessment and Plan:   NSTEMI Mixed Shock HTN & DM HFrEF 25-30% ESRD PAD with prior BKA seen by [facility] Tobacco use LBBB Aortic Atherosclerosis and CAC - likely represents NSTEMI and HFrEF in the setting of profound vasculopathy - would continue ASA Plavix, and heparin; unclear  viability of LAD and RCA/CirC territory of perfusion defect; Patient is a poor candidate for revascularization - would not be a candidate for Impella in the setting of vasculopathy - unable to tolerate GDMT in the setting of hypotension - given ESRD and concerns about HD and hypotension; would not use milrinone; if needed; would use low dose dobutamine for support (starting at 2.5 mcg) - discussed with patient that limb salvage is not likely based on present circumstances - STAT Echo Pending  Discussed with patient, interventional team, and CC team   Risk Assessment/Risk Scores:    CRITICAL CARE Performed by: Ellissa Ayo A Danuel Felicetti  Total critical care time: 90  minutes. Critical care time was exclusive of separately billable procedures and treating other patients. Critical care was necessary to treat or prevent imminent or life-threatening deterioration. Critical care was time spent personally by me on the following activities: development of treatment plan with patient and/or surrogate as well as nursing, discussions with consultants, evaluation of patient's response to treatment, examination of patient, obtaining history from patient or surrogate,  ordering and performing treatments and interventions, ordering and review of laboratory studies, ordering and review of radiographic studies, pulse oximetry and re-evaluation of patient's condition.    Signed, Rudean Haskell, MD [place]  11/20/2020 7:43 PM    New York Heart Association (NYHA) Functional Class NYHA Class III  For questions or updates, please contact [facility] Please consult [email or web address] for contact info under     Signed, Werner Lean, MD  11/06/2020 6:28 PM

## 2020-11-01 NOTE — Anesthesia Preprocedure Evaluation (Signed)
Anesthesia Evaluation  Patient identified by MRN, date of birth, ID band Patient awake    Reviewed: Allergy & Precautions, NPO status , Patient's Chart, lab work & pertinent test results  Airway Mallampati: II  TM Distance: >3 FB     Dental   Pulmonary shortness of breath, Current Smoker and Patient abstained from smoking.,    breath sounds clear to auscultation       Cardiovascular hypertension, + Peripheral Vascular Disease  + dysrhythmias  Rhythm:Regular Rate:Normal     Neuro/Psych    GI/Hepatic Neg liver ROS, GERD  ,  Endo/Other  diabetesHypothyroidism   Renal/GU Renal disease     Musculoskeletal   Abdominal   Peds  Hematology  (+) anemia ,   Anesthesia Other Findings   Reproductive/Obstetrics                             Anesthesia Physical Anesthesia Plan  ASA: 3  Anesthesia Plan: General   Post-op Pain Management:    Induction: Intravenous  PONV Risk Score and Plan: 2 and Ondansetron, Dexamethasone and Midazolam  Airway Management Planned: Oral ETT  Additional Equipment:   Intra-op Plan:   Post-operative Plan: Possible Post-op intubation/ventilation  Informed Consent: I have reviewed the patients History and Physical, chart, labs and discussed the procedure including the risks, benefits and alternatives for the proposed anesthesia with the patient or authorized representative who has indicated his/her understanding and acceptance.     Dental advisory given  Plan Discussed with: CRNA and Anesthesiologist  Anesthesia Plan Comments:         Anesthesia Quick Evaluation

## 2020-11-01 NOTE — Progress Notes (Signed)
eLink Physician-Brief Progress Note Patient Name: Gary [name] DOB: 02-07-1956 MRN: [id number]   Date of Service  11/17/2020  HPI/Events of Note  Notified of hypoglycemic episode 23 requiring D50 Is now tolerating PO  eICU Interventions  Will hold off on giving IVF for now due to patient being a dialysis patient and is on multiple drips Refer back if hypoglycemia persistent despite PO intake     Intervention Category Major Interventions: Other:  Judd Lien 11/08/2020, 11:08 PM

## 2020-11-01 NOTE — Anesthesia Procedure Notes (Signed)
Arterial Line Insertion Start/End07/30/2022 3:00 PM, 11/01/2020 3:22 PM Performed by: Rande Brunt, CRNA  Patient location: OR. Preanesthetic checklist: patient identified, IV checked, site marked, risks and benefits discussed, surgical consent, monitors and equipment checked, pre-op evaluation, timeout performed and anesthesia consent Right, radial was placed Catheter size: 20 G Hand hygiene performed   Attempts: 2 Procedure performed using ultrasound guided technique. Ultrasound Notes:anatomy identified and needle tip was noted to be adjacent to the nerve/plexus identified Following insertion, dressing applied and Biopatch. Patient tolerated the procedure well with no immediate complications.

## 2020-11-01 NOTE — Op Note (Signed)
Patient became hypotensive after induction of anesthesia with systolic pressures in the 40's  We were able to get this up with epinephrine, however he did not appear to be stable enough to proceed with surgery and so the procedure was aborted.   Annamarie Major

## 2020-11-01 NOTE — H&P (Signed)
History and Physical    Gary [name] OZH:[id number] DOB: [birth year] DOA: 11/08/2020  PCP: Janifer Adie, MD (Confirm with patient/family/NH records and if not entered, this has to be entered at [facility] point of entry) Patient coming from: Home  I have personally briefly reviewed patient's old medical records in [place]  Chief Complaint: Left leg pain  HPI: Gary [name] is a 65 y.o. male with medical history significant of PVD with recurrent/ chronic left lower extremity ischemia and right BKA, ESRD on HD, chronic systolic CHF, HTN, HLD, COPD, who recently underwent left lower extremity ischemia intervention with endarterectomy of left SFA and profundofemoral artery and below-knee stenting came with recurrent left leg pain.  And underwent above-mentioned procedure on 6/30.  Patient went to HD center today while during HD session, he started to feel left lower extremity pain below the knee and left foot pain, he described exactly pain he had before the vascular surgery intervention last week and his left foot again was cool to touch.  ED Course: Physical exam showed patient had decreased blood flow to the left foot and left foot cool to touch.  Vascular surgeon eval the patient and appears patient had recurrent left lower extremity ischemia.  Heparin drip started, vascular surgeon discussed with patient regarding surgical options and patient prefers to reserve left foot/leg as much as possible.  Review of Systems: As per HPI otherwise 14 point review of systems negative.    Past Medical History:  Diagnosis Date   Blurred vision    Chronic kidney disease    sees [facility]   Constipation    Constipation    Diabetes mellitus    type 2   GERD (gastroesophageal reflux disease)    High cholesterol    Hypertension    Insomnia    Peripheral artery disease (HCC)    [place]-wine stain of face    Left V1 distribution, including upper eyelid   Sensorineural hearing loss    left  ear   Vitamin D deficiency     Past Surgical History:  Procedure Laterality Date   A/V FISTULAGRAM Left 01/20/2020   Procedure: A/V FISTULAGRAM;  Surgeon: Serafina Mitchell, MD;  Location: [place] [facility];  Service: Cardiovascular;  Laterality: Left;   ABDOMINAL AORTOGRAM W/LOWER EXTREMITY N/A 10/12/2020   Procedure: ABDOMINAL AORTOGRAM W/LOWER EXTREMITY;  Surgeon: Serafina Mitchell, MD;  Location: [place] [facility];  Service: Cardiovascular;  Laterality: N/A;   AMPUTATION Right 07/17/2011   AMPUTATION Left 10/03/2012   Procedure: AMPUTATION DIGIT;  Surgeon: Serafina Mitchell, MD;  Location: [place];  Service: Vascular;  Laterality: Left;  Amputation of left Great  toe   AV FISTULA PLACEMENT Left 08/30/2017   Procedure: CREATION OF LEFT BRACHIOCEPHALIC ARTERIOVENOUS FISTULA;  Surgeon: Serafina Mitchell, MD;  Location: [place];  Service: Vascular;  Laterality: Left;   COLONOSCOPY     ENDARTERECTOMY FEMORAL Left 10/28/2020   Procedure: ENDARTERECTOMY COMMON FEMORAL ARTERY;  Surgeon: Waynetta Sandy, MD;  Location: [place];  Service: Vascular;  Laterality: Left;   LOWER EXTREMITY ANGIOGRAM Left 10/28/2020   Procedure: LEFT LOWER EXTREMITY  ANGIOGRAM WITH BALLOON ANGIOPLASTY AND SUPERIFICIAL FEMORAL ARTERY STENT PLACEMENT;  Surgeon: Waynetta Sandy, MD;  Location: [place];  Service: Vascular;  Laterality: Left;   LOWER EXTREMITY ANGIOGRAPHY Left 10/28/2020   Procedure: Lower Extremity Angiography;  Surgeon: Waynetta Sandy, MD;  Location: [place] [facility];  Service: Cardiovascular;  Laterality: Left;  PATCH ANGIOPLASTY Left 10/28/2020   Procedure: PATCH ANGIOPLASTY OF LEFT COMMON FEMORAL ARTERY;  Surgeon: Waynetta Sandy, MD;  Location: [place];  Service: Vascular;  Laterality: Left;   PERIPHERAL ARTERIAL STENT GRAFT N/A 2010   PERIPHERAL VASCULAR ATHERECTOMY Left 10/28/2020   Procedure: PERIPHERAL VASCULAR ATHERECTOMY;  Surgeon: Waynetta Sandy, MD;  Location: [place] [facility];  Service: Cardiovascular;  Laterality: Left;  femoral/popliteal;  and posterior tibial   PERIPHERAL VASCULAR BALLOON ANGIOPLASTY Left 10/28/2020   Procedure: PERIPHERAL VASCULAR BALLOON ANGIOPLASTY;  Surgeon: Waynetta Sandy, MD;  Location: [place] [facility];  Service: Cardiovascular;  Laterality: Left;  PT/ SFA   PERIPHERAL VASCULAR INTERVENTION Left 10/28/2020   Procedure: PERIPHERAL VASCULAR INTERVENTION;  Surgeon: Waynetta Sandy, MD;  Location: [place] [facility];  Service: Cardiovascular;  Laterality: Left;  popliteal   STUMP REVISION Right 01/28/2016   Procedure: Revision Right Below Knee Amputation;  Surgeon: Newt Minion, MD;  Location: [place];  Service: Orthopedics;  Laterality: Right;     reports that he has been smoking cigarettes. He has a 100.00 pack-year smoking history. He has never used smokeless tobacco. He reports that he does not drink alcohol and does not use drugs.  Allergies  Allergen Reactions   Oxycodone-Acetaminophen Rash and Other (See Comments)    Macular papular rash after two days on Oxy/APAP resolved with d/c of drug.    Family History  Problem Relation Age of Onset   Diabetes Mother    Hypertension Mother    Cancer Father    Colon cancer Neg Hx      Prior to Admission medications   Medication Sig Start Date End Date Taking? Authorizing Provider  acetaminophen (TYLENOL) 500 MG tablet Take 1,000 mg by mouth 3 (three) times daily as needed for mild pain.    [provider]  albuterol (VENTOLIN HFA) 108 (90 Base) MCG/ACT inhaler Inhale 2 puffs into the lungs every 6 (six) hours as needed for wheezing or shortness of breath.    [provider]  aspirin 81 MG chewable tablet Chew 81 mg by mouth daily.    [provider]  Benzocaine 20 % OINT Place 1 application rectally 5 (five) times daily as needed (hemmorhoids/anal fissure).    [provider]  bisacodyl (DULCOLAX) 5 MG EC tablet Take 5  mg by mouth daily.    [provider]  ciprofloxacin (CIPRO) 250 MG tablet Take 250 mg by mouth daily.    [provider]  clopidogrel (PLAVIX) 75 MG tablet Take 1 tablet (75 mg total) by mouth daily. 10/31/20   Hosie Poisson, MD  Dulaglutide (TRULICITY) 3 XN/1.7GY SOPN Inject 3 mg into the skin once a week.    [provider]  Evolocumab with Infusor ([place]) 420 MG/3.5ML SOCT Inject 420 mg into the skin every 30 (thirty) days.    [provider]  famotidine (PEPCID) 40 MG tablet Take 40 mg by mouth at bedtime.    [provider]  furosemide (LASIX) 80 MG tablet Take 80-120 mg by mouth See admin instructions. Take 1.5 tablets (120 mg) by mouth in the morning & take 1 tablet (80 mg) by mouth at noon for blood pressure/fluid/potassium    [provider]  metoCLOPramide (REGLAN) 5 MG tablet Take 5 mg by mouth 2 (two) times daily as needed for nausea or vomiting.    [provider]  multivitamin (RENA-VIT) TABS tablet Take 1 tablet by mouth daily.  [provider]  Nitroglycerin (RECTIV) 0.4 % OINT Place 1 application rectally every 12 (twelve) hours as needed (hemmorhoids/anal fissure).    [provider]  Nutritional Supplements (FEEDING SUPPLEMENT, NEPRO CARB STEADY,) LIQD Take 240 mLs by mouth daily.    [provider]  polyethylene glycol (MIRALAX / GLYCOLAX) 17 g packet Take 17 g by mouth daily.    [provider]  rosuvastatin (CRESTOR) 20 MG tablet Take 20 mg by mouth daily.     [provider]  senna-docusate (SENOKOT-S) 8.6-50 MG tablet Take 2 tablets by mouth in the morning and at bedtime.    [provider]  umeclidinium-vilanterol (ANORO ELLIPTA) 62.5-25 MCG/INH AEPB Inhale 1 puff into the lungs daily.    [provider]  Vitamin D, Ergocalciferol, (DRISDOL) 1.25 MG (50000 UNIT) CAPS capsule Take 50,000 Units by mouth every 30 (thirty) days.     [provider]    Physical Exam: Vitals:   11/12/2020 1330 11/02/2020 1345 11/25/2020 1430 11/02/2020 1441  BP: 106/60 111/68  103/61  Pulse: 85 80  74  Resp: 19 19  18   Temp:  98.1 F (36.7 C)  (!) 97.4 F (36.3 C)  TempSrc:  Oral  Oral  SpO2: 100% 99%  100%  Weight:   59.6 kg   Height:   5\' 6"  (1.676 m)     Constitutional: NAD, calm, comfortable Vitals:   11/12/2020 1330 11/24/2020 1345 11/02/2020 1430 11/08/2020 1441  BP: 106/60 111/68  103/61  Pulse: 85 80  74  Resp: 19 19  18   Temp:  98.1 F (36.7 C)  (!) 97.4 F (36.3 C)  TempSrc:  Oral  Oral  SpO2: 100% 99%  100%  Weight:   59.6 kg   Height:   5\' 6"  (1.676 m)    Eyes: PERRL, lids and conjunctivae normal ENMT: Mucous membranes are moist. Posterior pharynx clear of any exudate or lesions.Normal dentition.  Neck: normal, supple, no masses, no thyromegaly Respiratory: clear to auscultation bilaterally, no wheezing, no crackles. Normal respiratory effort. No accessory muscle use.  Cardiovascular: Regular rate and rhythm, no murmurs / rubs / gallops. No extremity edema.  Decreased pedal pulses on left foot,. No carotid bruits.  Abdomen: no tenderness, no masses palpated. No hepatosplenomegaly. Bowel sounds positive.  Musculoskeletal: no clubbing / cyanosis. No joint deformity upper and lower extremities. Good ROM, no contractures. Normal muscle tone.  Skin: Mottled skin color on left foot and cool to touch. Neurologic: CN 2-12 grossly intact. Sensation intact, DTR normal. Strength 5/5 in all 4.  Psychiatric: Normal judgment and insight. Alert and oriented x 3. Normal mood.     Labs on Admission: I have personally reviewed following labs and imaging studies  CBC: Recent Labs  Lab 10/28/20 0730 10/28/20 1551 10/29/20 0530 10/29/20 0848 11/19/2020 1130  WBC  --   --  12.3* 13.3* 11.5*  NEUTROABS  --   --  9.9*  --  8.9*  HGB 11.6* 11.2* 8.7* 9.1* 8.5*  HCT 34.0* 33.0* 28.2* 30.1* 26.7*  MCV  --   --  76.8* 78.4* 75.6*   PLT  --   --  177 173 704   Basic Metabolic Panel: Recent Labs  Lab 10/28/20 0730 10/28/20 1551 10/29/20 0530 11/04/2020 1130  NA 134* 136 136 134*  K 5.5* 3.6 4.3 3.6  CL 94*  --  95* 92*  CO2  --   --  26 26  GLUCOSE 173*  --  222* 135*  BUN 65*  --  51* 16  CREATININE 7.30*  --  8.36* 3.28*  CALCIUM  --   --  8.5* 8.1*   GFR: Estimated Creatinine Clearance: 19.2 mL/min (A) (by C-G formula based on SCr of 3.28 mg/dL (H)). Liver Function Tests: No results for input(s): AST, ALT, ALKPHOS, BILITOT, PROT, ALBUMIN in the last 168 hours. No results for input(s): LIPASE, AMYLASE in the last 168 hours. No results for input(s): AMMONIA in the last 168 hours. Coagulation Profile: Recent Labs  Lab 11/02/2020 1130  INR 1.6*   Cardiac Enzymes: No results for input(s): CKTOTAL, CKMB, CKMBINDEX, TROPONINI in the last 168 hours. BNP (last 3 results) No results for input(s): PROBNP in the last 8760 hours. HbA1C: No results for input(s): HGBA1C in the last 72 hours. CBG: Recent Labs  Lab 10/29/20 2118 10/30/20 0619 10/30/20 1237 10/30/20 1711 11/11/2020 1439  GLUCAP 251* 220* 230* 183* 111*   Lipid Profile: No results for input(s): CHOL, HDL, LDLCALC, TRIG, CHOLHDL, LDLDIRECT in the last 72 hours. Thyroid Function Tests: No results for input(s): TSH, T4TOTAL, FREET4, T3FREE, THYROIDAB in the last 72 hours. Anemia Panel: No results for input(s): VITAMINB12, FOLATE, FERRITIN, TIBC, IRON, RETICCTPCT in the last 72 hours. Urine analysis:    Component Value Date/Time   COLORURINE YELLOW 10/15/2008 0959   APPEARANCEUR CLOUDY (A) 10/15/2008 0959   LABSPEC 1.025 10/15/2008 0959   PHURINE 5.5 10/15/2008 0959   GLUCOSEU NEGATIVE 10/15/2008 0959   HGBUR NEGATIVE 10/15/2008 0959   BILIRUBINUR SMALL (A) 10/15/2008 0959   KETONESUR NEGATIVE 10/15/2008 0959   PROTEINUR NEGATIVE 10/15/2008 0959   UROBILINOGEN 1.0 10/15/2008 0959   NITRITE NEGATIVE 10/15/2008 0959   LEUKOCYTESUR LARGE  (A) 10/15/2008 0959    Radiological Exams on Admission: VAS [place] LOWER EXTREMITY ARTERIAL DUPLEX  Result Date: 10/31/2020 LOWER EXTREMITY ARTERIAL DUPLEX STUDY Patient Name:  KEELYN FJELSTAD [facility]  Date of Exam:   11/17/2020 Medical Rec #: [id number]       Accession #:    [id number] Date of Birth: 1955-07-16      Patient Gender: M Patient Age:   63Y [facility] Procedure:      VAS [place] LOWER EXTREMITY ARTERIAL DUPLEX Referring Phys: [place] --------------------------------------------------------------------------------  Indications: Peripheral artery disease Worsening left lower extremity pain and              cold left foot. High Risk Factors: Hypertension, hyperlipidemia, Diabetes. 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 ESRD.  Vascular Interventions: Left common femoral, SFA and profundofemoral                         endarterectomy below-knee and stenting of SFA 10/28/20.                         Right BKA. Current ABI:            N/A Comparison Study: No prior study on file Performing Technologist: Sharion Dove RVS  Examination Guidelines: A complete evaluation includes B-mode imaging, spectral Doppler, color Doppler, and power Doppler as needed of all accessible portions of each vessel. Bilateral testing is considered an integral part of a complete examination. Limited examinations for reoccurring indications may be performed as noted.   +--------+--------+-----+--------+--------+--------+ LEFT    PSV cm/sRatioStenosisWaveformComments +--------+--------+-----+--------+--------+--------+ CFA [id number]                                    +--------+--------+-----+--------+--------+--------+  Summary: Left: SFA and popliteal stents appear occluded. No Dopplerable flow in the posterior tibial, peroneal, or anterior tibial arteries.  See table(s) above for measurements and observations. Electronically signed by Harold Barban MD on 10/29/2020 at 2:28:03 PM.     Final     EKG: Independently reviewed. Chronic LBBB  Assessment/Plan Active Problems:   Limb ischemia   S/P femoral-popliteal bypass surgery  (please populate well all problems here in Problem List. (For example, if patient is on BP meds at home and you resume or decide to hold them, it is a problem that needs to be her. Same for CAD, COPD, HLD and so on)  Recurrent left lower extremity ischemia, acute on chronic -Recent effort to reach vascular left lower extremity. -Heparin drip -As per discussion between patient and vascular surgeon, patient prefers preserve his left lower extremity as much as possible. -Continue aspirin and Plavix and statin.  PVD -As above.  ESRD on HD -Completed HD session today, next session on Wednesday.  Informed nephrology.  HTN -Continue home regimen.  IIDM -Sliding scale for now  COPD -Stable  DVT prophylaxis: Heparin drip Code Status: Full code Family Communication: None at bedside Disposition Plan: Expect more than 2 midnight hospital stay, likely will need vascular intervention Consults called: Vascular surgeon and nephrology Admission status: Tele admit   Lequita Halt MD [facility] Pager [phone]  10/30/2020, 3:29 PM

## 2020-11-01 NOTE — Progress Notes (Signed)
eLink Physician-Brief Progress Note Patient Name: AMEYA KUTZ DOB: 05-12-1955 MRN: [id number]   Date of Service  11/11/2020  HPI/Events of Note  65 M smoker, history of ESRD on HD, HFrEF <20%, PAD s/p with multiple interventions. Started complaining of left leg feeling cold during dialysis earlier. He was brought to ED after dialysis and then to OR for bypass limb salvage surgery but became hypotensive during induction and procedure was aborted. Also had been placed on BiPap for desaturation. Now on just nasal cannula and seen not in distress.  Unable to obtain blood culture from peripheral site  eICU Interventions  Cardiogenic vs septic vs hypovolemic the former more likely On heparin infusion for ALI May obtain blood culture from arterial line as newly placed and done in OR under sterile conditions     Intervention Category Major Interventions: Shock - evaluation and management Evaluation Type: New Patient Evaluation  Judd Lien 11/10/2020, 8:35 PM

## 2020-11-01 NOTE — Consult Note (Signed)
NAME:  Gary [name], MRN:  [id number], DOB:  [birth year], LOS: 0 ADMISSION DATE:  11/16/2020, CONSULTATION DATE:  11/24/2020 REFERRING MD:  Carrolyn Leigh, CHIEF COMPLAINT:  shock   History of Present Illness:  Mr. Hillyard is a 65 y/o gentleman with a history of HFrEF, PAD s/p femoral laser atherectomy of the L SFA, popliteal, tibioperoneal, and posterior tibial artery, balloon angioplasty of the L SFA, stenting of the L popliteal artery complicated by a femoral artery dissection, which required a thrombectomy of SFA, common fem, and profunda femoral arteries with bovine pericardial patch angioplasty and stenting of the SFA on 6/30 after having a nonhealing ulcer on his foot. He is unsure if he has been taking his prescribed aspirin and plavix since discharge on 7/2. During dialysis this morning he felt his leg turn cold and painful, and after completing dialysis presented to the ED. He was taken to the operative room for a bypass graft limb salvage surgery of the left fem-pop but developed shock after induction. He required epinephrine and phenylephrine in the OR. At that time the procedure was aborted.  He was initially hypoxic on bipap, but has improved and is now on [place]. He is an ongoing smoker.   Pertinent  Medical History   PAD, recent L femoral end arterectomy ESRD on HD HTN DM  Significant Hospital Events: Including procedures, antibiotic start and stop dates in addition to other pertinent events   6/30 R femoral bypass surgery 7/2 discharged home 7/4 readmitted after completing dialysis with acute limb ischemia. After intubation he was hypotensive and procedure was cancelled.  Interim History / Subjective:    Objective   Blood pressure 131/82, pulse 92, temperature (!) 97.5 F (36.4 C), resp. rate 20, height 5\' 6"  (1.676 m), weight 59.6 kg, SpO2 94 %.        Intake/Output Summary (Last 24 hours) at 11/11/2020 1726 Last data filed at 11/08/2020 1649 Gross per 24 hour  Intake 900 ml   Output --  Net 900 ml   Filed Weights   11/02/2020 1430  Weight: 59.6 kg    Examination: General: chronically ill appearing man lying in bed in NAD HENT: [place]/AT, eyes anicteric, temporal wasting Lungs: CTAB, breathing comfortably on [place] Cardiovascular: S1S2, RRR Abdomen: soft, NT Extremities: R BKA, L femoral artery with good pulse, dopplerable and palpable L popliteal, not palpable or dopplerable PT or DP pulses. LLE notably cooler in distal LE than R leg. Neuro: awake, alert, sluggishly responsive but tracks with his eyes and answers most questions with short phrases.  GU: normal  Derm: no rashes. No erythema around femoral incision with skin glue. No ecchymoses  CXR personally reviewed> No pulmonary opacities. Cardiomegaly present. Small amount of mediastinal free air in paratracheal stripes bilaterally.  Resolved Hospital Problem list     Assessment & Plan:   Acute LLE ischemia after recent femoral bypass surgery. Unable to perform salvage surgery today due to development of shock after anesthesia induction. -Con't heparin infusion -appreciate vascular surgery's management;  may be unable to salvage his leg  -DAPT resumed; unclear if he was taking this at home. Wife unavailable by phone. -Resume statin  Shock- very concerned about primary cardiac cause. May be distributive from cytokines from acute limb ischemia. PE seems less likely -echo, EKG, troponin STAT -monitor on tele -neosynephrine for now, but will transition to norepinephrine given h/o HFrEF  Acute metabolic acidosis -repeat BMP pending -1 amp bicarb now -appreciate nephrology's input  Chronic HFrEF, CAD with  ICM, chronic LBBB -maintain euvolemia -monitor on tele -DAPT -not on Bblockers, ACE/ARB PTA-- will hold for now with hypotension. -resume statin -repeat echo STAT -discussed case with cardiology, who will consult on patient  Acute metabolic encephalopathy, suspect this is due to procedural related  medications -avoid sedation  ESRD -nephrology has been consulted by [facility] -renal diet when stable to take PO  Acute hypoxic respiratory failure- suspect this is due to hypotension, possibly atelectasis -supplemental O2 to maintain SpO2 >94%  DM with hyperglycemia -SSI PRN -goal BG 140-180  Acute anemia, likely recent post-op blood loss -transfuse for Hb <7 or hemodynamically significant bleeding  Mild coagulopathy, INR 1.6 -no need for FFP currently -monitor  Tobacco abuse -recommend complete cessation; will counsel when appropriate  He has designated his wife Abigail Butts as his Air traffic controller, but she was unavailable by phone.   Best Practice (right click and "Reselect all SmartList Selections" daily)   Diet/type: NPO w/ oral meds DVT prophylaxis: systemic heparin GI prophylaxis: N/A Lines: Arterial Line Foley:  N/A Code Status:  full code Last date of multidisciplinary goals of care discussion [not able to reach family]  Labs   CBC: Recent Labs  Lab 10/28/20 1551 10/29/20 0530 10/29/20 0848 10/29/2020 1130 10/31/2020 1638  WBC  --  12.3* 13.3* 11.5*  --   NEUTROABS  --  9.9*  --  8.9*  --   HGB 11.2* 8.7* 9.1* 8.5* 9.9*  HCT 33.0* 28.2* 30.1* 26.7* 29.0*  MCV  --  76.8* 78.4* 75.6*  --   PLT  --  177 173 181  --     Basic Metabolic Panel: Recent Labs  Lab 10/28/20 0730 10/28/20 1551 10/29/20 0530 11/26/2020 1130 11/21/2020 1638  NA 134* 136 136 134* 134*  K 5.5* 3.6 4.3 3.6 4.1  CL 94*  --  95* 92*  --   CO2  --   --  26 26  --   GLUCOSE 173*  --  222* 135*  --   BUN 65*  --  51* 16  --   CREATININE 7.30*  --  8.36* 3.28*  --   CALCIUM  --   --  8.5* 8.1*  --    GFR: Estimated Creatinine Clearance: 19.2 mL/min (A) (by C-G formula based on SCr of 3.28 mg/dL (H)). Recent Labs  Lab 10/29/20 0530 10/29/20 0848 11/22/2020 1130  WBC 12.3* 13.3* 11.5*    Liver Function Tests: No results for input(s): AST, ALT, ALKPHOS, BILITOT, PROT, ALBUMIN in the  last 168 hours. No results for input(s): LIPASE, AMYLASE in the last 168 hours. No results for input(s): AMMONIA in the last 168 hours.  ABG    Component Value Date/Time   PHART 7.410 11/24/2020 1638   PCO2ART 27.5 (L) 10/29/2020 1638   PO2ART 188 (H) 11/06/2020 1638   HCO3 17.6 (L) 11/11/2020 1638   TCO2 19 (L) 11/26/2020 1638   ACIDBASEDEF 6.0 (H) 11/13/2020 1638   O2SAT 100.0 11/20/2020 1638     Coagulation Profile: Recent Labs  Lab 11/08/2020 1130  INR 1.6*    Cardiac Enzymes: No results for input(s): CKTOTAL, CKMB, CKMBINDEX, TROPONINI in the last 168 hours.  HbA1C: Hgb A1c MFr Bld  Date/Time Value Ref Range Status  10/29/2020 08:48 AM 9.4 (H) 4.8 - 5.6 % Final    Comment:    (NOTE) Pre diabetes:          5.7%-6.4%  Diabetes:              >  6.4%  Glycemic control for   <7.0% adults with diabetes   10/28/2020 08:30 PM 9.5 (H) 4.8 - 5.6 % Final    Comment:    (NOTE)         Prediabetes: 5.7 - 6.4         Diabetes: >6.4         Glycemic control for adults with diabetes: <7.0     CBG: Recent Labs  Lab 10/30/20 0619 10/30/20 1237 10/30/20 1711 11/24/2020 1439 11/02/2020 1636  GLUCAP 220* 230* 183* 111* 77    Review of Systems:   Limited due to encephalopathy  Past Medical History:  He,  has a past medical history of Blurred vision, Chronic kidney disease, Constipation, Constipation, Diabetes mellitus, GERD (gastroesophageal reflux disease), High cholesterol, Hypertension, Insomnia, Peripheral artery disease (HCC), [place] stain of face, Sensorineural hearing loss, and Vitamin D deficiency.   Surgical History:   Past Surgical History:  Procedure Laterality Date   A/V FISTULAGRAM Left 01/20/2020   Procedure: A/V FISTULAGRAM;  Surgeon: Serafina Mitchell, MD;  Location: [place] [facility];  Service: Cardiovascular;  Laterality: Left;   ABDOMINAL AORTOGRAM W/LOWER EXTREMITY N/A 10/12/2020   Procedure: ABDOMINAL AORTOGRAM W/LOWER EXTREMITY;  Surgeon: Serafina Mitchell, MD;  Location: [place] [facility];  Service: Cardiovascular;  Laterality: N/A;   AMPUTATION Right 07/17/2011   AMPUTATION Left 10/03/2012   Procedure: AMPUTATION DIGIT;  Surgeon: Serafina Mitchell, MD;  Location: [place];  Service: Vascular;  Laterality: Left;  Amputation of left Great  toe   AV FISTULA PLACEMENT Left 08/30/2017   Procedure: CREATION OF LEFT BRACHIOCEPHALIC ARTERIOVENOUS FISTULA;  Surgeon: Serafina Mitchell, MD;  Location: [place];  Service: Vascular;  Laterality: Left;   COLONOSCOPY     ENDARTERECTOMY FEMORAL Left 10/28/2020   Procedure: ENDARTERECTOMY COMMON FEMORAL ARTERY;  Surgeon: Waynetta Sandy, MD;  Location: [place];  Service: Vascular;  Laterality: Left;   LOWER EXTREMITY ANGIOGRAM Left 10/28/2020   Procedure: LEFT LOWER EXTREMITY  ANGIOGRAM WITH BALLOON ANGIOPLASTY AND SUPERIFICIAL FEMORAL ARTERY STENT PLACEMENT;  Surgeon: Waynetta Sandy, MD;  Location: [place];  Service: Vascular;  Laterality: Left;   LOWER EXTREMITY ANGIOGRAPHY Left 10/28/2020   Procedure: Lower Extremity Angiography;  Surgeon: Waynetta Sandy, MD;  Location: [place] [facility];  Service: Cardiovascular;  Laterality: Left;   PATCH ANGIOPLASTY Left 10/28/2020   Procedure: PATCH ANGIOPLASTY OF LEFT COMMON FEMORAL ARTERY;  Surgeon: Waynetta Sandy, MD;  Location: [place];  Service: Vascular;  Laterality: Left;   PERIPHERAL ARTERIAL STENT GRAFT N/A 2010   PERIPHERAL VASCULAR ATHERECTOMY Left 10/28/2020   Procedure: PERIPHERAL VASCULAR ATHERECTOMY;  Surgeon: Waynetta Sandy, MD;  Location: [place] [facility];  Service: Cardiovascular;  Laterality: Left;  femoral/popliteal;  and posterior tibial   PERIPHERAL VASCULAR BALLOON ANGIOPLASTY Left 10/28/2020   Procedure: PERIPHERAL VASCULAR BALLOON ANGIOPLASTY;  Surgeon: Waynetta Sandy, MD;  Location: [place] [facility];  Service: Cardiovascular;  Laterality: Left;  PT/ SFA   PERIPHERAL VASCULAR INTERVENTION Left  10/28/2020   Procedure: PERIPHERAL VASCULAR INTERVENTION;  Surgeon: Waynetta Sandy, MD;  Location: [place] [facility];  Service: Cardiovascular;  Laterality: Left;  popliteal   STUMP REVISION Right 01/28/2016   Procedure: Revision Right Below Knee Amputation;  Surgeon: Newt Minion, MD;  Location: [place];  Service: Orthopedics;  Laterality: Right;     Social History:   reports that he has been smoking cigarettes. He has a 100.00 pack-year smoking  history. He has never used smokeless tobacco. He reports that he does not drink alcohol and does not use drugs.   Family History:  His family history includes Cancer in his father; Diabetes in his mother; Hypertension in his mother. There is no history of Colon cancer.   Allergies Allergies  Allergen Reactions   Oxycodone-Acetaminophen Rash and Other (See Comments)    Macular papular rash after two days on Oxy/APAP resolved with d/c of drug.     Home Medications  Prior to Admission medications   Medication Sig Start Date End Date Taking? Authorizing Provider  acetaminophen (TYLENOL) 500 MG tablet Take 1,000 mg by mouth 3 (three) times daily as needed for mild pain.    [provider]  albuterol (VENTOLIN HFA) 108 (90 Base) MCG/ACT inhaler Inhale 2 puffs into the lungs every 6 (six) hours as needed for wheezing or shortness of breath.    [provider]  aspirin 81 MG chewable tablet Chew 81 mg by mouth daily.    [provider]  Benzocaine 20 % OINT Place 1 application rectally 5 (five) times daily as needed (hemmorhoids/anal fissure).    [provider]  bisacodyl (DULCOLAX) 5 MG EC tablet Take 5 mg by mouth daily.    [provider]  ciprofloxacin (CIPRO) 250 MG tablet Take 250 mg by mouth daily.    [provider]  clopidogrel (PLAVIX) 75 MG tablet Take 1 tablet (75 mg total) by mouth daily. 10/31/20   Hosie Poisson, MD  Dulaglutide (TRULICITY) 3 WF/0.9NA SOPN Inject 3 mg into the  skin once a week.    [provider]  Evolocumab with Infusor ([place]) 420 MG/3.5ML SOCT Inject 420 mg into the skin every 30 (thirty) days.    [provider]  famotidine (PEPCID) 40 MG tablet Take 40 mg by mouth at bedtime.    [provider]  furosemide (LASIX) 80 MG tablet Take 80-120 mg by mouth See admin instructions. Take 1.5 tablets (120 mg) by mouth in the morning & take 1 tablet (80 mg) by mouth at noon for blood pressure/fluid/potassium    [provider]  metoCLOPramide (REGLAN) 5 MG tablet Take 5 mg by mouth 2 (two) times daily as needed for nausea or vomiting.    [provider]  multivitamin (RENA-VIT) TABS tablet Take 1 tablet by mouth daily.    [provider]  Nitroglycerin (RECTIV) 0.4 % OINT Place 1 application rectally every 12 (twelve) hours as needed (hemmorhoids/anal fissure).    [provider]  Nutritional Supplements (FEEDING SUPPLEMENT, NEPRO CARB STEADY,) LIQD Take 240 mLs by mouth daily.    [provider]  polyethylene glycol (MIRALAX / GLYCOLAX) 17 g packet Take 17 g by mouth daily.    [provider]  rosuvastatin (CRESTOR) 20 MG tablet Take 20 mg by mouth daily.     [provider]  senna-docusate (SENOKOT-S) 8.6-50 MG tablet Take 2 tablets by mouth in the morning and at bedtime.    [provider]  umeclidinium-vilanterol (ANORO ELLIPTA) 62.5-25 MCG/INH AEPB Inhale 1 puff into the lungs daily.    [provider]  Vitamin D, Ergocalciferol, (DRISDOL) 1.25 MG (50000 UNIT) CAPS capsule Take 50,000 Units by mouth every 30 (thirty) days.    [provider]     Critical care time: 65 min.      Julian Hy, DO 11/23/2020 6:26 PM [facility]

## 2020-11-01 NOTE — ED Triage Notes (Signed)
Pt arrives via EMS from HD with complaints of left leg pain and Lower extremity being cold to the touch. Faint pulse noted in foot.  Recent Endarectomy of femoral artery 10/28/2020.  Right AKA in 2013  Pt completed HD BP  91/55 HR 96 97% RA 26 RR

## 2020-11-01 NOTE — Progress Notes (Signed)
  Echocardiogram 2D Echocardiogram has been performed.  Gary [name] 11/17/2020, 8:10 PM

## 2020-11-01 NOTE — Addendum Note (Signed)
Addendum  created 10/31/2020 2055 by Rande Brunt, CRNA   Flowsheet accepted

## 2020-11-01 NOTE — Transfer of Care (Signed)
Immediate Anesthesia Transfer of Care Note  Patient: Gary [name]  Procedure(s) Performed: ATTEMPTED BYPASS GRAFT FEMORAL-POPLITEAL ARTERY (Left)  Patient Location: PACU  Anesthesia Type:General  Level of Consciousness: awake, drowsy, patient cooperative and responds to stimulation  Airway & Oxygen Therapy: Patient Spontanous Breathing and Patient connected to face mask oxygen  Post-op Assessment: Report given to RN, Post -op Vital signs reviewed and stable and Patient moving all extremities  Post vital signs: Reviewed and stable  Last Vitals:  Vitals Value Taken Time  BP 119/83 11/18/2020 1640  Temp    Pulse 94 11/12/2020 1640  Resp 22 11/05/2020 1646  SpO2 62 % 11/26/2020 1640  Vitals shown include unvalidated device data.  Last Pain:  Vitals:   11/05/2020 1441  TempSrc: Oral  PainSc:       Patients Stated Pain Goal: 4 (22/48/25 0037)  Complications: No notable events documented.

## 2020-11-01 NOTE — Progress Notes (Signed)
Vascular surgeon Dr. Trula Slade called about patient, initial plan was for vascular surgeon to perform bypass of left lower extremity to rescue the left leg but while during anesthesia, patient became hypotensive and procedure aborted.  Updated patient to PCU.  Hold Lasix today.

## 2020-11-01 NOTE — ED Provider Notes (Signed)
[facility] EMERGENCY DEPARTMENT Provider Note   CSN: [id number] Arrival date & time: 10/31/2020  1045     History Chief Complaint  Patient presents with   Cold Extremity    Gary [name] is a 65 y.o. male.  The history is provided by the patient and medical records.  Gary [name] is a 65 y.o. male who presents to the Emergency Department complaining of leg pain. He presents to the ED by EMS from HD for evaluation of pain to the left foot.  Pain started two days ago and is constant in nature. Pain is worse on the plantar surface.  He was able to complete HD.  Denies CP, SOB, fever, nausea, vomiting.  He is unsure of current medications.      Past Medical History:  Diagnosis Date   Blurred vision    Chronic kidney disease    sees [facility]   Constipation    Constipation    Diabetes mellitus    type 2   GERD (gastroesophageal reflux disease)    High cholesterol    Hypertension    Insomnia    Peripheral artery disease (HCC)    Port-wine stain of face    Left V1 distribution, including upper eyelid   Sensorineural hearing loss    left ear   Vitamin D deficiency     Patient Active Problem List   Diagnosis Date Noted   S/P femoral-popliteal bypass surgery 11/23/2020   Critical lower limb ischemia (HCC)    Limb ischemia 10/28/2020   Preoperative clearance 10/14/2020   Aortic atherosclerosis ([place]) 10/14/2020   LBBB (left bundle branch block) 10/14/2020   Chronic respiratory failure with hypoxia ([place]) 09/29/2020   Malnutrition of moderate degree 08/21/2020   Mediastinal adenopathy 08/21/2020   Hilar adenopathy 08/21/2020   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 ([place]) 08/21/2020   Elevated troponin    Hypokalemia 10/30/2018   Hypothyroidism, unspecified 09/25/2018   Encounter for removal of sutures 09/11/2018   Mild protein-calorie malnutrition ([place]) 07/10/2018   Pain, unspecified 06/24/2018   Pruritus, unspecified 06/24/2018    Shortness of breath 06/24/2018   Acquired absence of right leg below knee ([place]) 06/18/2018   Anemia in chronic kidney disease 06/18/2018   Arteriovenous fistula, acquired ([place]) 06/18/2018   Coagulation defect, unspecified ([place]) 06/18/2018   ESRD on hemodialysis ([place]) 06/18/2018   Fluid overload, unspecified 06/18/2018   Hyperlipidemia, unspecified 06/18/2018   Iron deficiency anemia, unspecified 06/18/2018   Other disorders resulting from impaired renal tubular function 06/18/2018   Patient's noncompliance with other medical treatment and regimen 06/18/2018   Secondary hyperparathyroidism of renal origin ([place]) 06/18/2018   Type 2 diabetes mellitus with diabetic neuropathy, unspecified ([place]) 06/18/2018   Type 2 diabetes mellitus with other diabetic kidney complication ([place]) 98/26/4158   Below knee amputation status 01/28/2016   Aftercare following surgery of the circulatory system, NEC 12/01/2013   Follow-up examination, following unspecified surgery 11/25/2012   Atherosclerosis of native arteries of the extremities with ulceration(440.23) 11/25/2012   PAD (peripheral artery disease) ([place]) 10/01/2012   Pure hypercholesterolemia 09/26/2012   Atherosclerosis of native arteries of the extremities, unspecified 09/26/2012   Type II or unspecified type diabetes mellitus with neurological manifestations, not stated as uncontrolled(250.60) 09/26/2012   Type II or unspecified type diabetes mellitus with ophthalmic manifestations, not stated as uncontrolled(250.50) 09/26/2012   blurred vision due to refraction problems 09/26/2012   poor dentition 09/26/2012   Insomnia 09/26/2012   Essential  hypertension 09/26/2012   Tobacco use disorder 09/26/2012   BKA right leg 09/26/2012   Vitamin D deficiency aeb lab values of 16.5 ng/mL 09/26/2012   Routine general medical examination at a health care facility 09/26/2012   GERD (gastroesophageal reflux disease) 09/26/2012   Sensorineural hearing loss (Per  ENT) 09/26/2012   constipation 09/26/2012   Osteomyelitis of left great toe 09/26/2012   Toe osteomyelitis, left ([place]) 09/26/2012   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    Past Surgical History:  Procedure Laterality Date   A/V FISTULAGRAM Left 01/20/2020   Procedure: A/V FISTULAGRAM;  Surgeon: Serafina Mitchell, MD;  Location: [place] [facility];  Service: Cardiovascular;  Laterality: Left;   ABDOMINAL AORTOGRAM W/LOWER EXTREMITY N/A 10/12/2020   Procedure: ABDOMINAL AORTOGRAM W/LOWER EXTREMITY;  Surgeon: Serafina Mitchell, MD;  Location: [place] [facility];  Service: Cardiovascular;  Laterality: N/A;   AMPUTATION Right 07/17/2011   AMPUTATION Left 10/03/2012   Procedure: AMPUTATION DIGIT;  Surgeon: Serafina Mitchell, MD;  Location: [place];  Service: Vascular;  Laterality: Left;  Amputation of left Great  toe   AV FISTULA PLACEMENT Left 08/30/2017   Procedure: CREATION OF LEFT BRACHIOCEPHALIC ARTERIOVENOUS FISTULA;  Surgeon: Serafina Mitchell, MD;  Location: [place];  Service: Vascular;  Laterality: Left;   COLONOSCOPY     ENDARTERECTOMY FEMORAL Left 10/28/2020   Procedure: ENDARTERECTOMY COMMON FEMORAL ARTERY;  Surgeon: Waynetta Sandy, MD;  Location: [place];  Service: Vascular;  Laterality: Left;   LOWER EXTREMITY ANGIOGRAM Left 10/28/2020   Procedure: LEFT LOWER EXTREMITY  ANGIOGRAM WITH BALLOON ANGIOPLASTY AND SUPERIFICIAL FEMORAL ARTERY STENT PLACEMENT;  Surgeon: Waynetta Sandy, MD;  Location: [place];  Service: Vascular;  Laterality: Left;   LOWER EXTREMITY ANGIOGRAPHY Left 10/28/2020   Procedure: Lower Extremity Angiography;  Surgeon: Waynetta Sandy, MD;  Location: [place] [facility];  Service: Cardiovascular;  Laterality: Left;   PATCH ANGIOPLASTY Left 10/28/2020   Procedure: PATCH ANGIOPLASTY OF LEFT COMMON FEMORAL ARTERY;  Surgeon: Waynetta Sandy, MD;  Location: [place];  Service: Vascular;  Laterality: Left;   PERIPHERAL ARTERIAL STENT GRAFT N/A 2010    PERIPHERAL VASCULAR ATHERECTOMY Left 10/28/2020   Procedure: PERIPHERAL VASCULAR ATHERECTOMY;  Surgeon: Waynetta Sandy, MD;  Location: [place] [facility];  Service: Cardiovascular;  Laterality: Left;  femoral/popliteal;  and posterior tibial   PERIPHERAL VASCULAR BALLOON ANGIOPLASTY Left 10/28/2020   Procedure: PERIPHERAL VASCULAR BALLOON ANGIOPLASTY;  Surgeon: Waynetta Sandy, MD;  Location: [place] [facility];  Service: Cardiovascular;  Laterality: Left;  PT/ SFA   PERIPHERAL VASCULAR INTERVENTION Left 10/28/2020   Procedure: PERIPHERAL VASCULAR INTERVENTION;  Surgeon: Waynetta Sandy, MD;  Location: [place] [facility];  Service: Cardiovascular;  Laterality: Left;  popliteal   STUMP REVISION Right 01/28/2016   Procedure: Revision Right Below Knee Amputation;  Surgeon: Newt Minion, MD;  Location: [place];  Service: Orthopedics;  Laterality: Right;       Family History  Problem Relation Age of Onset   Diabetes Mother    Hypertension Mother    Cancer Father    Colon cancer Neg Hx     Social History   Tobacco Use   Smoking status: Every Day    Packs/day: 2.00    Years: 50.00    Pack years: 100.00    Types: Cigarettes   Smokeless tobacco: Never   Tobacco comments:    4 cigs per day 09/21/20//lmr  Vaping Use   Vaping Use: Never  used  Substance Use Topics   Alcohol use: No    Alcohol/week: 0.0 standard drinks   Drug use: No    Home Medications Prior to Admission medications   Medication Sig Start Date End Date Taking? Authorizing Provider  acetaminophen (TYLENOL) 500 MG tablet Take 1,000 mg by mouth 3 (three) times daily as needed for mild pain.    [provider]  albuterol (VENTOLIN HFA) 108 (90 Base) MCG/ACT inhaler Inhale 2 puffs into the lungs every 6 (six) hours as needed for wheezing or shortness of breath.    [provider]  aspirin 81 MG chewable tablet Chew 81 mg by mouth daily.    [provider]  Benzocaine 20 %  OINT Place 1 application rectally 5 (five) times daily as needed (hemmorhoids/anal fissure).    [provider]  bisacodyl (DULCOLAX) 5 MG EC tablet Take 5 mg by mouth daily.    [provider]  ciprofloxacin (CIPRO) 250 MG tablet Take 250 mg by mouth daily.    [provider]  clopidogrel (PLAVIX) 75 MG tablet Take 1 tablet (75 mg total) by mouth daily. 10/31/20   Hosie Poisson, MD  Dulaglutide (TRULICITY) 3 LG/9.2JJ SOPN Inject 3 mg into the skin once a week.    [provider]  Evolocumab with Infusor (Winfred) 420 MG/3.5ML SOCT Inject 420 mg into the skin every 30 (thirty) days.    [provider]  famotidine (PEPCID) 40 MG tablet Take 40 mg by mouth at bedtime.    [provider]  furosemide (LASIX) 80 MG tablet Take 80-120 mg by mouth See admin instructions. Take 1.5 tablets (120 mg) by mouth in the morning & take 1 tablet (80 mg) by mouth at noon for blood pressure/fluid/potassium    [provider]  metoCLOPramide (REGLAN) 5 MG tablet Take 5 mg by mouth 2 (two) times daily as needed for nausea or vomiting.    [provider]  multivitamin (RENA-VIT) TABS tablet Take 1 tablet by mouth daily.    [provider]  Nitroglycerin (RECTIV) 0.4 % OINT Place 1 application rectally every 12 (twelve) hours as needed (hemmorhoids/anal fissure).    [provider]  Nutritional Supplements (FEEDING SUPPLEMENT, NEPRO CARB STEADY,) LIQD Take 240 mLs by mouth daily.    [provider]  polyethylene glycol (MIRALAX / GLYCOLAX) 17 g packet Take 17 g by mouth daily.    [provider]  rosuvastatin (CRESTOR) 20 MG tablet Take 20 mg by mouth daily.     [provider]  senna-docusate (SENOKOT-S) 8.6-50 MG tablet Take 2 tablets by mouth in the morning and at bedtime.    [provider]  umeclidinium-vilanterol (ANORO ELLIPTA) 62.5-25 MCG/INH AEPB Inhale 1 puff into the lungs  daily.    [provider]  Vitamin D, Ergocalciferol, (DRISDOL) 1.25 MG (50000 UNIT) CAPS capsule Take 50,000 Units by mouth every 30 (thirty) days.    [provider]    Allergies    Oxycodone-acetaminophen  Review of Systems   Review of Systems  All other systems reviewed and are negative.  Physical Exam Updated Vital Signs BP 103/61   Pulse 74   Temp (!) 97.4 F (36.3 C) (Oral)   Resp 18   Ht _0  (1.676 m)   Wt 59.6 kg   SpO2 100%   BMI 21.21 kg/m   Physical Exam Vitals and nursing note reviewed.  Constitutional:      Appearance: He is well-developed.  HENT:     Head: Normocephalic and atraumatic.  Cardiovascular:     Rate and Rhythm: Normal rate and regular rhythm.     Heart sounds: No murmur heard. Pulmonary:     Effort: Pulmonary effort is normal. No respiratory distress.     Breath sounds: Normal breath sounds.  Abdominal:     Palpations: Abdomen is soft.     Tenderness: There is no abdominal tenderness. There is no guarding or rebound.  Musculoskeletal:     Comments: Right lower extremity BKA.  2+left femoral pulse.  Surgical wound in groin is c/d/I.  Left foot is cool to touch on the distal 1/3.  Faint DP pulse.    Skin:    General: Skin is warm and dry.  Neurological:     Mental Status: He is alert and oriented to person, place, and time.  Psychiatric:        Behavior: Behavior normal.    ED Results / Procedures / Treatments   Labs (all labs ordered are listed, but only abnormal results are displayed) Labs Reviewed  BASIC METABOLIC PANEL - Abnormal; Notable for the following components:      Result Value   Sodium 134 (*)    Chloride 92 (*)    Glucose, Bld 135 (*)    Creatinine, Ser 3.28 (*)    Calcium 8.1 (*)    GFR, Estimated 20 (*)    Anion gap 16 (*)    All other components within normal limits  CBC WITH DIFFERENTIAL/PLATELET - Abnormal; Notable for the following components:   WBC 11.5 (*)    RBC 3.53 (*)    Hemoglobin  8.5 (*)    HCT 26.7 (*)    MCV 75.6 (*)    MCH 24.1 (*)    RDW 17.7 (*)    nRBC 0.3 (*)    Neutro Abs 8.9 (*)    All other components within normal limits  PROTIME-INR - Abnormal; Notable for the following components:   Prothrombin Time 18.8 (*)    INR 1.6 (*)    All other components within normal limits  APTT - Abnormal; Notable for the following components:   aPTT 39 (*)    All other components within normal limits  GLUCOSE, CAPILLARY - Abnormal; Notable for the following components:   Glucose-Capillary 111 (*)    All other components within normal limits  RESP PANEL BY RT-PCR (FLU A&B, COVID) ARPGX2  HEPARIN LEVEL (UNFRACTIONATED)    EKG None  Radiology VAS [place] LOWER EXTREMITY ARTERIAL DUPLEX  Result Date: 11/19/2020 LOWER EXTREMITY ARTERIAL DUPLEX STUDY Patient Name:  Gary [name] [facility]  Date of Exam:   10/31/2020 Medical Rec #: [id number]       Accession #:    [id number] Date of Birth: 13-Nov-1955      Patient Gender: M Patient Age:   55Y Exam Location:  [facility] Procedure:      VAS [place] LOWER EXTREMITY ARTERIAL DUPLEX Referring Phys: [id number] Runion --------------------------------------------------------------------------------  Indications: Peripheral artery disease Worsening left lower extremity pain and              cold left foot. High Risk Factors: Hypertension, hyperlipidemia, Diabetes. 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 ESRD.  Vascular Interventions: Left common femoral, SFA and profundofemoral                         endarterectomy below-knee and stenting of SFA 10/28/20.  Right BKA. Current ABI:            N/A Comparison Study: No prior study on file Performing Technologist: Sharion Dove RVS  Examination Guidelines: A complete evaluation includes B-mode imaging, spectral Doppler, color Doppler, and power Doppler as needed of all accessible portions of each vessel. Bilateral testing is considered an integral part of a  complete examination. Limited examinations for reoccurring indications may be performed as noted.   +--------+--------+-----+--------+--------+--------+ LEFT    PSV cm/sRatioStenosisWaveformComments +--------+--------+-----+--------+--------+--------+ CFA Prox51                                    +--------+--------+-----+--------+--------+--------+  Summary: Left: SFA and popliteal stents appear occluded. No Dopplerable flow in the posterior tibial, peroneal, or anterior tibial arteries.  See table(s) above for measurements and observations. Electronically signed by Harold Barban MD on 11/09/2020 at 2:28:03 PM.    Final     Procedures Procedures  CRITICAL CARE Performed by: Quintella Reichert   Total critical care time: 35 minutes  Critical care time was exclusive of separately billable procedures and treating other patients.  Critical care was necessary to treat or prevent imminent or life-threatening deterioration.  Critical care was time spent personally by me on the following activities: development of treatment plan with patient and/or surrogate as well as nursing, discussions with consultants, evaluation of patient's response to treatment, examination of patient, obtaining history from patient or surrogate, ordering and performing treatments and interventions, ordering and review of laboratory studies, ordering and review of radiographic studies, pulse oximetry and re-evaluation of patient's condition.  Medications Ordered in ED Medications  heparin bolus via infusion 2,980 Units (2,980 Units Intravenous Bolus from Bag 11/12/2020 1256)    And  heparin ADULT infusion 100 units/mL (25000 units/250m) (1,000 Units/hr Intravenous Continued from Pre-op 11/05/2020 1451)  ceFAZolin (ANCEF) 2-4 GM/100ML-% IVPB (has no administration in time range)  0.9 %  sodium chloride infusion ( Intravenous Restarted 11/09/2020 1452)  0.9 % irrigation (POUR BTL) (2,000 mLs Irrigation Given 11/19/2020 1447)   EPINEPHrine (ADRENALIN) 5 mg in NS 250 mL (0.02 mg/mL) premix infusion (has no administration in time range)  heparin 6000 units / NS 500 mL irrigation (5892application Irrigation Given 11/27/2020 1530)  insulin aspart (novoLOG) injection 0-6 Units (has no administration in time range)  fentaNYL (SUBLIMAZE) injection 50 mcg (50 mcg Intravenous Given 11/07/2020 1159)  chlorhexidine (PERIDEX) 0.12 % solution 15 mL (15 mLs Mouth/Throat Given 11/05/2020 1446)    Or  MEDLINE mouth rinse ( Mouth Rinse See Alternative 11/24/2020 1446)    ED Course  I have reviewed the triage vital signs and the nursing notes.  Pertinent labs & imaging results that were available during my care of the patient were reviewed by me and considered in my medical decision making (see chart for details).    MDM Rules/Calculators/A&P                         patient with history of ESR D as well as peripheral arterial disease here for evaluation of increased pain to the left foot. He does have decreased perfusion to the foot. Discussed with Dr. BTrula Sladewith vascular surgery - will see the patient in consult. Medicine consulted for admission. He was started on heparin for limb ischemia.  Final Clinical Impression(s) / ED Diagnoses Final diagnoses:  Critical [place] limb ischemia [place]    Rx /  DC Orders ED Discharge Orders     None        Quintella Reichert, MD 11/28/2020 [phone]

## 2020-11-01 NOTE — OR Nursing (Signed)
PATIENTS PROCEDURE CANCELLED BY ANESTHESIOLOGIST AFTER INTUBATION AND PATIENT PREPPED AND DRAPED

## 2020-11-01 NOTE — Anesthesia Postprocedure Evaluation (Signed)
Anesthesia Post Note  Patient: Gary [name]  Procedure(s) Performed: ATTEMPTED BYPASS GRAFT FEMORAL-POPLITEAL ARTERY (Left)     Patient location during evaluation: PACU Anesthesia Type: General Level of consciousness: awake Pain management: pain level controlled Vital Signs Assessment: post-procedure vital signs reviewed and stable Respiratory status: spontaneous breathing Cardiovascular status: stable Postop Assessment: no apparent nausea or vomiting Anesthetic complications: no   No notable events documented.  Last Vitals:  Vitals:   11/15/2020 1640 10/29/2020 1655  BP: 119/83 131/82  Pulse: 94 92  Resp: 20 20  Temp:    SpO2: 94%     Last Pain:  Vitals:   11/04/2020 1625  TempSrc:   PainSc: Asleep                 Dafna Romo

## 2020-11-02 ENCOUNTER — Encounter (HOSPITAL_COMMUNITY): Payer: Self-pay | Admitting: Surgery

## 2020-11-02 ENCOUNTER — Other Ambulatory Visit: Payer: Self-pay

## 2020-11-02 ENCOUNTER — Inpatient Hospital Stay (HOSPITAL_COMMUNITY): Payer: Medicare (Managed Care)

## 2020-11-02 DIAGNOSIS — I214 Non-ST elevation (NSTEMI) myocardial infarction: Secondary | ICD-10-CM

## 2020-11-02 DIAGNOSIS — Z452 Encounter for adjustment and management of vascular access device: Secondary | ICD-10-CM

## 2020-11-02 LAB — CBC
HCT: 25.3 % — ABNORMAL LOW (ref 39.0–52.0)
Hemoglobin: 7.8 g/dL — ABNORMAL LOW (ref 13.0–17.0)
MCH: 24 pg — ABNORMAL LOW (ref 26.0–34.0)
MCHC: 30.8 g/dL (ref 30.0–36.0)
MCV: 77.8 fL — ABNORMAL LOW (ref 80.0–100.0)
Platelets: 173 10*3/uL (ref 150–400)
RBC: 3.25 MIL/uL — ABNORMAL LOW (ref 4.22–5.81)
RDW: 17.9 % — ABNORMAL HIGH (ref 11.5–15.5)
WBC: 15.7 10*3/uL — ABNORMAL HIGH (ref 4.0–10.5)
nRBC: 0.3 % — ABNORMAL HIGH (ref 0.0–0.2)

## 2020-11-02 LAB — COMPREHENSIVE METABOLIC PANEL
ALT: 1193 U/L — ABNORMAL HIGH (ref 0–44)
AST: 3226 U/L — ABNORMAL HIGH (ref 15–41)
Albumin: 2.8 g/dL — ABNORMAL LOW (ref 3.5–5.0)
Alkaline Phosphatase: 540 U/L — ABNORMAL HIGH (ref 38–126)
Anion gap: 23 — ABNORMAL HIGH (ref 5–15)
BUN: 25 mg/dL — ABNORMAL HIGH (ref 8–23)
CO2: 19 mmol/L — ABNORMAL LOW (ref 22–32)
Calcium: 8.2 mg/dL — ABNORMAL LOW (ref 8.9–10.3)
Chloride: 93 mmol/L — ABNORMAL LOW (ref 98–111)
Creatinine, Ser: 4.31 mg/dL — ABNORMAL HIGH (ref 0.61–1.24)
GFR, Estimated: 15 mL/min — ABNORMAL LOW (ref >60)
Glucose, Bld: 170 mg/dL — ABNORMAL HIGH (ref 70–99)
Potassium: 4 mmol/L (ref 3.5–5.1)
Sodium: 135 mmol/L (ref 135–145)
Total Bilirubin: 3.4 mg/dL — ABNORMAL HIGH (ref 0.3–1.2)
Total Protein: 5.7 g/dL — ABNORMAL LOW (ref 6.5–8.1)

## 2020-11-02 LAB — HEPARIN LEVEL (UNFRACTIONATED)
Heparin Unfractionated: 0.12 IU/mL — ABNORMAL LOW (ref 0.30–0.70)
Heparin Unfractionated: 0.13 IU/mL — ABNORMAL LOW (ref 0.30–0.70)
Heparin Unfractionated: 0.16 IU/mL — ABNORMAL LOW (ref 0.30–0.70)

## 2020-11-02 LAB — POCT I-STAT 7, (LYTES, BLD GAS, ICA,H+H)
Acid-base deficit: 3 mmol/L — ABNORMAL HIGH (ref 0.0–2.0)
Bicarbonate: 21.6 mmol/L (ref 20.0–28.0)
Calcium, Ion: 0.93 mmol/L — ABNORMAL LOW (ref 1.15–1.40)
HCT: 27 % — ABNORMAL LOW (ref 39.0–52.0)
Hemoglobin: 9.2 g/dL — ABNORMAL LOW (ref 13.0–17.0)
O2 Saturation: 100 %
Patient temperature: 35.7
Potassium: 4.7 mmol/L (ref 3.5–5.1)
Sodium: 134 mmol/L — ABNORMAL LOW (ref 135–145)
TCO2: 23 mmol/L (ref 22–32)
pCO2 arterial: 33.4 mmHg (ref 32.0–48.0)
pH, Arterial: 7.413 (ref 7.350–7.450)
pO2, Arterial: 378 mmHg — ABNORMAL HIGH (ref 83.0–108.0)

## 2020-11-02 LAB — GLUCOSE, CAPILLARY
Glucose-Capillary: 119 mg/dL — ABNORMAL HIGH (ref 70–99)
Glucose-Capillary: 136 mg/dL — ABNORMAL HIGH (ref 70–99)
Glucose-Capillary: 152 mg/dL — ABNORMAL HIGH (ref 70–99)
Glucose-Capillary: 170 mg/dL — ABNORMAL HIGH (ref 70–99)
Glucose-Capillary: 171 mg/dL — ABNORMAL HIGH (ref 70–99)
Glucose-Capillary: 175 mg/dL — ABNORMAL HIGH (ref 70–99)
Glucose-Capillary: 185 mg/dL — ABNORMAL HIGH (ref 70–99)

## 2020-11-02 LAB — PROTIME-INR
INR: 2.4 — ABNORMAL HIGH (ref 0.8–1.2)
Prothrombin Time: 26.2 seconds — ABNORMAL HIGH (ref 11.4–15.2)

## 2020-11-02 LAB — LACTIC ACID, PLASMA
Lactic Acid, Venous: 10 mmol/L (ref 0.5–1.9)
Lactic Acid, Venous: 8.3 mmol/L (ref 0.5–1.9)

## 2020-11-02 LAB — TROPONIN I (HIGH SENSITIVITY)
Troponin I (High Sensitivity): 793 ng/L (ref <18)
Troponin I (High Sensitivity): 1560 ng/L (ref <18)

## 2020-11-02 LAB — MAGNESIUM: Magnesium: 2.2 mg/dL (ref 1.7–2.4)

## 2020-11-02 LAB — PHOSPHORUS: Phosphorus: 7.2 mg/dL — ABNORMAL HIGH (ref 2.5–4.6)

## 2020-11-02 MED ORDER — SODIUM CHLORIDE 0.9 % IV SOLN
1.0000 g | INTRAVENOUS | Status: DC
  Filled 2020-11-02: qty 1

## 2020-11-02 MED ORDER — SODIUM CHLORIDE 0.9 % IV SOLN
1.0000 g | Freq: Once | INTRAVENOUS | Status: AC
  Administered 2020-11-02: 1 g via INTRAVENOUS
  Filled 2020-11-02: qty 1

## 2020-11-02 MED ORDER — LACTATED RINGERS IV BOLUS: 500.0000 mL | Freq: Once | INTRAVENOUS | Status: DC

## 2020-11-02 NOTE — Progress Notes (Signed)
[place] for heparin Indication:  limb ischemia  Allergies  Allergen Reactions   Oxycodone-Acetaminophen Rash and Other (See Comments)    Macular papular rash after two days on Oxy/APAP resolved with d/c of drug.    Patient Measurements: Height: 5\' 6"  (167.6 cm) Weight: 59.5 kg (131 lb 2.8 oz) IBW/kg (Calculated) : 63.8 Heparin Dosing Weight: 59.6 kg  Estimated Creatinine Clearance: 14.6 mL/min (A) (by C-G formula based on SCr of 4.31 mg/dL (H)).  Medical History:  Assessment: 65 yo M with cold extremity s/p R BKA. Hx of PVD, chronic ischemia of LLE. Recently patient underwent left common femoral, SFA and profundofemoral endarterectomy below-knee and stenting of SFA. L foot is cool to touch and distal 1/3. Faint DP pulse. He was taken to OR 7/4 but surgery aborted due to shock. -heparin level= 0.13 -Hg= 7.8  Goal of Therapy:  Heparin level 0.3-0.7 units/ml Monitor platelets by anticoagulation protocol: Yes   Plan:  Increase heparin infusion to 1300 units/hr -Order heparin level in 8 hours  -Monitor daily HL, CBC, and any s/sx of bleeding  Hildred Laser, PharmD Clinical Pharmacist **Pharmacist phone directory can now be found on [email or web address] ([email or web address]).  Listed under [place].

## 2020-11-02 NOTE — Procedures (Signed)
Central Venous Catheter Insertion Procedure Note  Gary [name]  [id number]  01-09-1956  Date:11/02/20  Time:6:27 PM   Provider Performing:Gary [name]   Procedure: Insertion of Non-tunneled Central Venous [phone]) with US guidance ([id number])   Indication(s) Medication administration  Consent Risks of the procedure as well as the alternatives and risks of each were explained to the patient and/or caregiver.  Consent for the procedure was obtained and is signed in the bedside chart  Anesthesia Topical only with 1% lidocaine   Timeout Verified patient identification, verified procedure, site/side was marked, verified correct patient position, special equipment/implants available, medications/allergies/relevant history reviewed, required imaging and test results available.  Sterile Technique Maximal sterile technique including full sterile barrier drape, hand hygiene, sterile gown, sterile gloves, mask, hair covering, sterile ultrasound probe cover (if used).  Procedure Description Area of catheter insertion was cleaned with chlorhexidine and draped in sterile fashion.  With real-time ultrasound guidance a central venous catheter was placed into the left internal jugular vein. Nonpulsatile blood flow and easy flushing noted in all ports.  The catheter was sutured in place and sterile dressing applied.  Complications/Tolerance None; patient tolerated the procedure well. Chest X-ray is ordered to verify placement for internal jugular or subclavian cannulation.   Chest x-ray is not ordered for femoral cannulation.  EBL Minimal  Specimen(s) None  Gary [name]., MSN, APRN, AGACNP-BC [name]  11/02/2020 , 6:28 PM  Please see [email or web address] for pager details  If no response, please call [phone] After hours, please call Elink at [phone]

## 2020-11-02 NOTE — Progress Notes (Signed)
[place] for heparin Indication:  limb ischemia  Allergies  Allergen Reactions   Oxycodone-Acetaminophen Rash and Other (See Comments)    Macular papular rash after two days on Oxy/APAP resolved with d/c of drug.    Patient Measurements: Height: 5\' 6"  (167.6 cm) Weight: 59.5 kg (131 lb 2.8 oz) IBW/kg (Calculated) : 63.8 Heparin Dosing Weight: 59.6 kg  Estimated Creatinine Clearance: 14.6 mL/min (A) (by C-G formula based on SCr of 4.31 mg/dL (H)).  Medical History:  Assessment: 65 yo M with cold extremity s/p R BKA. Hx of PVD, chronic ischemia of LLE. Recently patient underwent left common femoral, SFA and profundofemoral endarterectomy below-knee and stenting of SFA. L foot is cool to touch and distal 1/3. Faint DP pulse. He was taken to OR 7/4 but surgery aborted due to shock. -heparin level= 0.13 -Hg= 7.8  7/5 AM update:  Heparin level is low on 1100 units/hr of heparin INR is 2.4 likely from liver issues (LFTs markedly elevated)  Goal of Therapy:  Heparin level 0.3-0.7 units/ml Monitor platelets by anticoagulation protocol: Yes   Plan:  Increase heparin infusion to 1300 units/hr -Order heparin level in 8 hours  -Monitor daily HL, CBC, and any s/sx of bleeding  Narda Bonds, PharmD, BCPS Clinical Pharmacist Phone: [phone]

## 2020-11-02 NOTE — Progress Notes (Signed)
Nephrology Follow-Up Consult note   Assessment/Recommendations: Gary [name] is a/an 65 y.o. male with a past medical history significant for PAD, R BKA, HFrEF, HTN, HLD, COPD, ESRD admitted for ischemic left leg now w/ shock.       PVD/LLE Ischemia: attempted intervention on 7/4 complicated by shock with induction of anesthesia.  Vascular surgery following.  Antibiotics and management per vascular.  ESRD: HD MWF.  Full treatment on 7/4.  No acute need for dialysis today.  Hopefully plan for dialysis tomorrow if hemodynamics improved.  Hemodialysis would be limited by blood pressure at this time.  Shock: Concerning for cardiogenic but also possible septic component.  Continue norepinephrine and management per primary team.  Metabolic acidosis: Bicarb minimally decreased to 19 today.  Continue to monitor  HFrEF: distended neck veins but not severely overloaded today. HD mgmt as above. Cardiology following  DM2: mgmt per primary  Anemia of CKD: recently received ESA. Hgb lower to 7.8. On heparin. CTM closely and transfuse if Hgb <7  Secondary hyperparathyroidism: Continue VD RA and binders  Recommendations conveyed to primary service.    [facility] 11/02/2020 9:04 AM  ___________________________________________________________  CC: ESRD  Interval History/Subjective: Patient awake and alert today.  States that he does not remember the details of yesterday very well.  Denies significant shortness of breath or chest pain at this time.  States dialysis went well yesterday at his home dialysis unit without any issues.  Lactic acid remains elevated but down to 8 this morning.  Remains on norepinephrine.   Medications:  Current Facility-Administered Medications  Medication Dose Route Frequency Provider Last Rate Last Admin   0.9 %  sodium chloride infusion  250 mL Intravenous Continuous Ollis, Brandi L, NP       acetaminophen (TYLENOL) tablet 1,000 mg   1,000 mg Oral TID PRN Wynetta Fines T, MD       albuterol (PROVENTIL) (2.5 MG/3ML) 0.083% nebulizer solution 2.5 mg  2.5 mg Inhalation Q6H PRN Wynetta Fines T, MD       aspirin chewable tablet 81 mg  81 mg Oral Daily Julian Hy, DO   81 mg at 11/28/2020 2042   bisacodyl (DULCOLAX) EC tablet 5 mg  5 mg Oral Daily Wynetta Fines T, MD       Chlorhexidine Gluconate Cloth 2 % PADS 6 each  6 each Topical Daily Julian Hy, DO   6 each at 11/11/2020 2203   clopidogrel (PLAVIX) tablet 75 mg  75 mg Oral Daily Julian Hy, DO   75 mg at 11/05/2020 2042   docusate sodium (COLACE) capsule 100 mg  100 mg Oral BID PRN Noemi Chapel P, DO       famotidine (PEPCID) tablet 20 mg  20 mg Oral QHS Wynetta Fines T, MD   20 mg at 11/11/2020 2113   feeding supplement (NEPRO CARB STEADY) liquid 240 mL  240 mL Oral Daily Wynetta Fines T, MD       heparin ADULT infusion 100 units/mL (25000 units/225mL)  1,300 Units/hr Intravenous Continuous Erenest Blank, RPH 11 mL/hr at 11/02/20 0800 1,100 Units/hr at 11/02/20 0800   HYDROcodone-acetaminophen (NORCO/VICODIN) 5-325 MG per tablet 1 tablet  1 tablet Oral Q4H PRN Icard, Leory Plowman L, DO   1 tablet at 11/02/20 0802   insulin aspart (novoLOG) injection 1-3 Units  1-3 Units Subcutaneous Q4H Julian Hy, DO   1 Units at 11/02/20 0331   MEDLINE mouth rinse  15  mL Mouth Rinse BID Julian Hy, DO   15 mL at 11/06/2020 2202   metoCLOPramide (REGLAN) tablet 5 mg  5 mg Oral BID PRN Lequita Halt, MD       multivitamin (RENA-VIT) tablet 1 tablet  1 tablet Oral QHS Lequita Halt, MD   1 tablet at 11/24/2020 2113   norepinephrine (LEVOPHED) 4mg  in 262mL premix infusion  0-40 mcg/min Intravenous Titrated Julian Hy, DO 18.75 mL/hr at 11/02/20 0800 5 mcg/min at 11/02/20 0800   phenylephrine (NEOSYNEPHRINE) 10-0.9 MG/250ML-% infusion  25-200 mcg/min Intravenous Titrated Donita Brooks, NP   Stopped at 11/21/2020 1859   phenylephrine-shark liver oil-mineral oil-petrolatum (PREPARATION H) rectal  ointment 1 application  1 application Rectal BID PRN Noemi Chapel P, DO       polyethylene glycol (MIRALAX / GLYCOLAX) packet 17 g  17 g Oral Daily Wynetta Fines T, MD       polyethylene glycol (MIRALAX / GLYCOLAX) packet 17 g  17 g Oral Daily PRN Noemi Chapel P, DO       rosuvastatin (CRESTOR) tablet 20 mg  20 mg Oral q1800 Wynetta Fines T, MD   20 mg at 11/19/2020 2042   senna-docusate (Senokot-S) tablet 2 tablet  2 tablet Oral QHS Lequita Halt, MD       umeclidinium-vilanterol (ANORO ELLIPTA) 62.5-25 MCG/INH 1 puff  1 puff Inhalation Daily Wynetta Fines T, MD   1 puff at 11/02/20 0740   [START ON 11/07/2020] Vitamin D (Ergocalciferol) (DRISDOL) capsule 50,000 Units  50,000 Units Oral Q30 days Lequita Halt, MD          Review of Systems: 10 systems reviewed and negative except per interval history/subjective  Physical Exam: Vitals:   11/02/20 0815 11/02/20 0830  BP:    Pulse: 86 87  Resp: (!) 24 11  Temp:    SpO2: 98% 98%   Total I/O In: 59.2 [I.V.:59.2] Out: -   Intake/Output Summary (Last 24 hours) at 11/02/2020 1191 Last data filed at 11/02/2020 0800 Gross per 24 hour  Intake 1767.01 ml  Output --  Net 1767.01 ml   Constitutional: Chronically ill-appearing, lying in bed, no distress ENMT: ears and nose without scars or lesions, MMM CV: normal rate, trace edema  Respiratory: Bilateral chest rise, clear to auscultation anteriorly, mild increased work of breathing Gastrointestinal: soft, non-tender, no palpable masses or hernias Skin: no visible lesions or rashes Extremities: Right BKA, left lower extremity cool Psych: alert, judgement/insight appropriate, appropriate mood and affect   Test Results I personally reviewed new and old clinical labs and radiology tests Lab Results  Component Value Date   NA 135 11/02/2020   K 4.0 11/02/2020   CL 93 (L) 11/02/2020   CO2 19 (L) 11/02/2020   BUN 25 (H) 11/02/2020   CREATININE 4.31 (H) 11/02/2020   CALCIUM 8.2 (L) 11/02/2020    ALBUMIN 2.8 (L) 11/02/2020   PHOS 7.2 (H) 11/02/2020      Recent Results (from the past 2160 hour(s))  Basic metabolic panel     Status: Abnormal   Collection Time: 08/17/20  1:41 PM  Result Value Ref Range   Sodium 137 135 - 145 mmol/L   Potassium 4.3 3.5 - 5.1 mmol/L   Chloride 92 (L) 98 - 111 mmol/L   CO2 26 22 - 32 mmol/L   Glucose, Bld 219 (H) 70 - 99 mg/dL    Comment: Glucose reference range applies only to samples taken after fasting for at  least 8 hours.   BUN 59 (H) 8 - 23 mg/dL   Creatinine, Ser 11.89 (H) 0.61 - 1.24 mg/dL   Calcium 9.1 8.9 - 10.3 mg/dL   GFR, Estimated 4 (L) >60 mL/min    Comment: (NOTE) Calculated using the CKD-EPI Creatinine Equation (2021)    Anion gap 19 (H) 5 - 15    Comment: Performed at [facility] [address], [place], [place] [address]  CBC with Differential     Status: Abnormal   Collection Time: 08/17/20  1:41 PM  Result Value Ref Range   WBC 13.2 (H) 4.0 - 10.5 K/uL   RBC 5.05 4.22 - 5.81 MIL/uL   Hemoglobin 13.6 13.0 - 17.0 g/dL   HCT 42.0 39.0 - 52.0 %   MCV 83.2 80.0 - 100.0 fL   MCH 26.9 26.0 - 34.0 pg   MCHC 32.4 30.0 - 36.0 g/dL   RDW 13.8 11.5 - 15.5 %   Platelets 246 150 - 400 K/uL   nRBC 0.0 0.0 - 0.2 %   Neutrophils Relative % 82 %   Neutro Abs 10.8 (H) 1.7 - 7.7 K/uL   Lymphocytes Relative 11 %   Lymphs Abs 1.4 0.7 - 4.0 K/uL   Monocytes Relative 6 %   Monocytes Absolute 0.8 0.1 - 1.0 K/uL   Eosinophils Relative 1 %   Eosinophils Absolute 0.1 0.0 - 0.5 K/uL   Basophils Relative 0 %   Basophils Absolute 0.0 0.0 - 0.1 K/uL   Immature Granulocytes 0 %   Abs Immature Granulocytes 0.04 0.00 - 0.07 K/uL    Comment: Performed at [facility] [address], [place], [place] [address]  Brain natriuretic peptide     Status: Abnormal   Collection Time: 08/17/20  1:41 PM  Result Value Ref Range   B Natriuretic Peptide >4,500.0 (H) 0.0 - 100.0 pg/mL    Comment: Performed at [facility]  [address], [place], [place] [address]  Troponin I (High Sensitivity)     Status: Abnormal   Collection Time: 08/17/20  1:41 PM  Result Value Ref Range   Troponin I (High Sensitivity) 256 (HH) <18 ng/L    Comment: CRITICAL RESULT CALLED TO, READ BACK BY AND VERIFIED WITH: Irena Reichmann RN [phone] K FORSYTH (NOTE) Elevated high sensitivity troponin I (hsTnI) values and significant  changes across serial measurements may suggest ACS but many other  chronic and acute conditions are known to elevate hsTnI results.  Refer to the Links section for chest pain algorithms and additional  guidance. Performed at [facility], [place] [address], [place], [place] [address]   Resp Panel by RT-PCR (Flu A&B, Covid) Nasopharyngeal Swab     Status: None   Collection Time: 08/17/20  2:04 PM   Specimen: Nasopharyngeal Swab; Nasopharyngeal(NP) swabs in vial transport medium  Result Value Ref Range   SARS Coronavirus 2 by RT PCR NEGATIVE NEGATIVE    Comment: (NOTE) SARS-CoV-2 target nucleic acids are NOT DETECTED.  The SARS-CoV-2 RNA is generally detectable in upper respiratory specimens during the acute phase of infection. The lowest concentration of SARS-CoV-2 viral copies this assay can detect is 138 copies/mL. A negative result does not preclude SARS-Cov-2 infection and should not be used as the sole basis for treatment or other patient management decisions. A negative result may occur with  improper specimen collection/handling, submission of specimen other than nasopharyngeal swab, presence of viral mutation(s) within the areas targeted  by this assay, and inadequate number of viral copies(<138 copies/mL). A negative result must be combined with clinical observations, patient history, and epidemiological information. The expected result is Negative.  Fact Sheet for Patients:  EntrepreneurPulse.com.au  Fact Sheet for Healthcare Providers:   IncredibleEmployment.be  This test is no t yet approved or cleared by the [place] FDA and  has been authorized for detection and/or diagnosis of SARS-CoV-2 by FDA under an Emergency Use Authorization (EUA). This EUA will remain  in effect (meaning this test can be used) for the duration of the COVID-19 declaration under Section 564(b)(1) of the Act, 21 U.S.C.section 360bbb-3(b)(1), unless the authorization is terminated  or revoked sooner.       Influenza A by PCR NEGATIVE NEGATIVE   Influenza B by PCR NEGATIVE NEGATIVE    Comment: (NOTE) The Xpert Xpress SARS-CoV-2/FLU/RSV plus assay is intended as an aid in the diagnosis of influenza from Nasopharyngeal swab specimens and should not be used as a sole basis for treatment. Nasal washings and aspirates are unacceptable for Xpert Xpress SARS-CoV-2/FLU/RSV testing.  Fact Sheet for Patients: EntrepreneurPulse.com.au  Fact Sheet for Healthcare Providers: IncredibleEmployment.be  This test is not yet approved or cleared by the [place] FDA and has been authorized for detection and/or diagnosis of SARS-CoV-2 by FDA under an Emergency Use Authorization (EUA). This EUA will remain in effect (meaning this test can be used) for the duration of the COVID-19 declaration under Section 564(b)(1) of the Act, 21 U.S.C. section 360bbb-3(b)(1), unless the authorization is terminated or revoked.  Performed at [facility], [place] [address] [place], [place] [address]   Lactic acid, plasma     Status: Abnormal   Collection Time: 08/17/20  2:46 PM  Result Value Ref Range   Lactic Acid, Venous 2.7 (HH) 0.5 - 1.9 mmol/L    Comment: CRITICAL RESULT CALLED TO, READ BACK BY AND VERIFIED WITH: Irena Reichmann RN [phone] K FORSYTH Performed at [facility] [address], [place], [place] [address]   Troponin I (High Sensitivity)     Status: Abnormal   Collection Time:  08/17/20  5:40 PM  Result Value Ref Range   Troponin I (High Sensitivity) 262 (HH) <18 ng/L    Comment: CRITICAL VALUE NOTED.  VALUE IS CONSISTENT WITH PREVIOUSLY REPORTED AND CALLED VALUE. (NOTE) Elevated high sensitivity troponin I (hsTnI) values and significant  changes across serial measurements may suggest ACS but many other  chronic and acute conditions are known to elevate hsTnI results.  Refer to the Links section for chest pain algorithms and additional  guidance. Performed at [facility], [place] [address], [place], [place] [address]   Creatinine, serum     Status: Abnormal   Collection Time: 08/17/20  5:40 PM  Result Value Ref Range   Creatinine, Ser 12.18 (H) 0.61 - 1.24 mg/dL   GFR, Estimated 4 (L) >60 mL/min    Comment: (NOTE) Calculated using the CKD-EPI Creatinine Equation (2021) Performed at [facility] [address], [place], [place] [address]   HIV Antibody (routine testing w rflx)     Status: None   Collection Time: 08/17/20  6:27 PM  Result Value Ref Range   HIV Screen 4th Generation wRfx Non Reactive Non Reactive    Comment: Performed at [facility], [place] [address], [place], [place] [address]  Hepatitis B surface antibody     Status: Abnormal   Collection Time: 08/17/20  6:28 PM  Result Value Ref Range   Hepatitis B-Post 4.0 (L) Immunity>9.9 mIU/mL    Comment: (NOTE)  Status of Immunity                     Anti-HBs Level  ------------------                     -------------- Inconsistent with Immunity                   0.0 - 9.9 Consistent with Immunity                          >9.9 Performed At: [facility] [place], [place] [id number] Rush Farmer MD [id number]   Hepatitis B surface antibody,qualitative     Status: None   Collection Time: 08/17/20  6:28 PM  Result Value Ref Range   Hep B S Ab NON REACTIVE NON REACTIVE    Comment: (NOTE) Inconsistent with immunity, less than 10 mIU/mL.  Performed at  [facility], [place] [address], [place], [place] [address]   Hepatitis B surface antigen     Status: None   Collection Time: 08/17/20  6:28 PM  Result Value Ref Range   Hepatitis B Surface Ag NON REACTIVE NON REACTIVE    Comment: Performed at [facility] [address], [place], [place] [address]  Glucose, capillary     Status: Abnormal   Collection Time: 08/17/20 11:13 PM  Result Value Ref Range   Glucose-Capillary 200 (H) 70 - 99 mg/dL    Comment: Glucose reference range applies only to samples taken after fasting for at least 8 hours.  CBC     Status: Abnormal   Collection Time: 08/18/20 12:43 AM  Result Value Ref Range   WBC 12.2 (H) 4.0 - 10.5 K/uL   RBC 4.20 (L) 4.22 - 5.81 MIL/uL   Hemoglobin 11.1 (L) 13.0 - 17.0 g/dL   HCT 34.8 (L) 39.0 - 52.0 %   MCV 82.9 80.0 - 100.0 fL   MCH 26.4 26.0 - 34.0 pg   MCHC 31.9 30.0 - 36.0 g/dL   RDW 13.6 11.5 - 15.5 %   Platelets 196 150 - 400 K/uL   nRBC 0.0 0.0 - 0.2 %    Comment: Performed at [facility], [place] [address], [place], [place] [address]  Renal function panel     Status: Abnormal   Collection Time: 08/18/20 12:43 AM  Result Value Ref Range   Sodium 136 135 - 145 mmol/L   Potassium 3.3 (L) 3.5 - 5.1 mmol/L   Chloride 95 (L) 98 - 111 mmol/L   CO2 28 22 - 32 mmol/L   Glucose, Bld 279 (H) 70 - 99 mg/dL    Comment: Glucose reference range applies only to samples taken after fasting for at least 8 hours.   BUN 28 (H) 8 - 23 mg/dL   Creatinine, Ser 7.33 (H) 0.61 - 1.24 mg/dL    Comment: DELTA CHECK NOTED DIALYSIS    Calcium 8.5 (L) 8.9 - 10.3 mg/dL   Phosphorus 5.1 (H) 2.5 - 4.6 mg/dL   Albumin 2.5 (L) 3.5 - 5.0 g/dL   GFR, Estimated 8 (L) >60 mL/min    Comment: (NOTE) Calculated using the CKD-EPI Creatinine Equation (2021)    Anion gap 13 5 - 15    Comment: Performed at [facility]  [address] [place], [place] [address]  Lactic acid, plasma     Status: Abnormal   Collection Time: 08/18/20  12:43 AM  Result Value Ref Range   Lactic Acid, Venous 3.0 (HH) 0.5 - 1.9 mmol/L    Comment: CRITICAL VALUE NOTED.  VALUE IS CONSISTENT WITH PREVIOUSLY REPORTED AND CALLED VALUE. Performed at [facility], [place] [address], [place], [place] [address]   Lactic acid, plasma     Status: Abnormal   Collection Time: 08/18/20  2:35 AM  Result Value Ref Range   Lactic Acid, Venous 2.4 (HH) 0.5 - 1.9 mmol/L    Comment: CRITICAL VALUE NOTED.  VALUE IS CONSISTENT WITH PREVIOUSLY REPORTED AND CALLED VALUE. Performed at [facility], [place] [address], [place], [place] [address]   Troponin I (High Sensitivity)     Status: Abnormal   Collection Time: 08/18/20  2:55 AM  Result Value Ref Range   Troponin I (High Sensitivity) 374 (HH) <18 ng/L    Comment: CRITICAL VALUE NOTED.  VALUE IS CONSISTENT WITH PREVIOUSLY REPORTED AND CALLED VALUE. (NOTE) Elevated high sensitivity troponin I (hsTnI) values and significant  changes across serial measurements may suggest ACS but many other  chronic and acute conditions are known to elevate hsTnI results.  Refer to the Links section for chest pain algorithms and additional  guidance. Performed at [facility], [place] [address], [place], [place] [address]   Troponin I (High Sensitivity)     Status: Abnormal   Collection Time: 08/18/20  5:37 AM  Result Value Ref Range   Troponin I (High Sensitivity) 437 (HH) <18 ng/L    Comment: CRITICAL VALUE NOTED.  VALUE IS CONSISTENT WITH PREVIOUSLY REPORTED AND CALLED VALUE. (NOTE) Elevated high sensitivity troponin I (hsTnI) values and significant  changes across serial measurements may suggest ACS but many other  chronic and acute conditions are known to elevate hsTnI results.  Refer to the Links section for chest pain algorithms and additional  guidance. Performed at [facility], [place] [address], [place], [place] [address]   Glucose, capillary     Status: Abnormal   Collection Time: 08/18/20   7:19 AM  Result Value Ref Range   Glucose-Capillary 108 (H) 70 - 99 mg/dL    Comment: Glucose reference range applies only to samples taken after fasting for at least 8 hours.  Troponin I (High Sensitivity)     Status: Abnormal   Collection Time: 08/18/20  9:14 AM  Result Value Ref Range   Troponin I (High Sensitivity) 459 (HH) <18 ng/L    Comment: CRITICAL VALUE NOTED.  VALUE IS CONSISTENT WITH PREVIOUSLY REPORTED AND CALLED VALUE. (NOTE) Elevated high sensitivity troponin I (hsTnI) values and significant  changes across serial measurements may suggest ACS but many other  chronic and acute conditions are known to elevate hsTnI results.  Refer to the Links section for chest pain algorithms and additional  guidance. Performed at [facility], [place] [address] [place], [place] [address]   Glucose, capillary     Status: Abnormal   Collection Time: 08/18/20 11:22 AM  Result Value Ref Range   Glucose-Capillary 184 (H) 70 - 99 mg/dL    Comment: Glucose reference range applies only to samples taken after fasting for at least 8 hours.  Troponin I (High Sensitivity)     Status: Abnormal   Collection Time: 08/18/20 11:30 AM  Result Value Ref Range   Troponin I (High Sensitivity) 388 (HH) <18 ng/L  Comment: CRITICAL VALUE NOTED.  VALUE IS CONSISTENT WITH PREVIOUSLY REPORTED AND CALLED VALUE. (NOTE) Elevated high sensitivity troponin I (hsTnI) values and significant  changes across serial measurements may suggest ACS but many other  chronic and acute conditions are known to elevate hsTnI results.  Refer to the Links section for chest pain algorithms and additional  guidance. Performed at [facility], [place] [address], [place], [place] [address]   Glucose, capillary     Status: Abnormal   Collection Time: 08/18/20  6:13 PM  Result Value Ref Range   Glucose-Capillary 181 (H) 70 - 99 mg/dL    Comment: Glucose reference range applies only to samples taken after fasting for at  least 8 hours.  Glucose, capillary     Status: Abnormal   Collection Time: 08/19/20 12:14 AM  Result Value Ref Range   Glucose-Capillary 116 (H) 70 - 99 mg/dL    Comment: Glucose reference range applies only to samples taken after fasting for at least 8 hours.  Renal function panel     Status: Abnormal   Collection Time: 08/19/20  3:45 AM  Result Value Ref Range   Sodium 135 135 - 145 mmol/L   Potassium 3.9 3.5 - 5.1 mmol/L   Chloride 97 (L) 98 - 111 mmol/L   CO2 27 22 - 32 mmol/L   Glucose, Bld 102 (H) 70 - 99 mg/dL    Comment: Glucose reference range applies only to samples taken after fasting for at least 8 hours.   BUN 20 8 - 23 mg/dL   Creatinine, Ser 5.05 (H) 0.61 - 1.24 mg/dL   Calcium 8.9 8.9 - 10.3 mg/dL   Phosphorus 2.9 2.5 - 4.6 mg/dL   Albumin 2.6 (L) 3.5 - 5.0 g/dL   GFR, Estimated 12 (L) >60 mL/min    Comment: (NOTE) Calculated using the CKD-EPI Creatinine Equation (2021)    Anion gap 11 5 - 15    Comment: Performed at [facility] [address] [place], [place] [address]  Glucose, capillary     Status: Abnormal   Collection Time: 08/19/20  8:53 AM  Result Value Ref Range   Glucose-Capillary 63 (L) 70 - 99 mg/dL    Comment: Glucose reference range applies only to samples taken after fasting for at least 8 hours.  Glucose, capillary     Status: None   Collection Time: 08/19/20  9:45 AM  Result Value Ref Range   Glucose-Capillary 94 70 - 99 mg/dL    Comment: Glucose reference range applies only to samples taken after fasting for at least 8 hours.  Glucose, capillary     Status: None   Collection Time: 08/19/20  1:14 PM  Result Value Ref Range   Glucose-Capillary 91 70 - 99 mg/dL    Comment: Glucose reference range applies only to samples taken after fasting for at least 8 hours.  Glucose, capillary     Status: Abnormal   Collection Time: 08/19/20  6:27 PM  Result Value Ref Range   Glucose-Capillary 184 (H) 70 - 99 mg/dL    Comment: Glucose reference  range applies only to samples taken after fasting for at least 8 hours.  Glucose, capillary     Status: Abnormal   Collection Time: 08/19/20  8:48 PM  Result Value Ref Range   Glucose-Capillary 167 (H) 70 - 99 mg/dL    Comment: Glucose reference range applies only to samples taken after fasting for at least 8 hours.  Renal function  panel     Status: Abnormal   Collection Time: 08/20/20  2:34 AM  Result Value Ref Range   Sodium 136 135 - 145 mmol/L   Potassium 4.1 3.5 - 5.1 mmol/L   Chloride 95 (L) 98 - 111 mmol/L   CO2 29 22 - 32 mmol/L   Glucose, Bld 113 (H) 70 - 99 mg/dL    Comment: Glucose reference range applies only to samples taken after fasting for at least 8 hours.   BUN 35 (H) 8 - 23 mg/dL   Creatinine, Ser 7.24 (H) 0.61 - 1.24 mg/dL   Calcium 9.3 8.9 - 10.3 mg/dL   Phosphorus 3.6 2.5 - 4.6 mg/dL   Albumin 2.6 (L) 3.5 - 5.0 g/dL   GFR, Estimated 8 (L) >60 mL/min    Comment: (NOTE) Calculated using the CKD-EPI Creatinine Equation (2021)    Anion gap 12 5 - 15    Comment: Performed at [facility] [address], [place], [place] [address]  Glucose, capillary     Status: Abnormal   Collection Time: 08/20/20  6:50 AM  Result Value Ref Range   Glucose-Capillary 112 (H) 70 - 99 mg/dL    Comment: Glucose reference range applies only to samples taken after fasting for at least 8 hours.  CBC     Status: Abnormal   Collection Time: 08/20/20  8:40 AM  Result Value Ref Range   WBC 13.2 (H) 4.0 - 10.5 K/uL   RBC 4.26 4.22 - 5.81 MIL/uL   Hemoglobin 11.2 (L) 13.0 - 17.0 g/dL   HCT 35.6 (L) 39.0 - 52.0 %   MCV 83.6 80.0 - 100.0 fL   MCH 26.3 26.0 - 34.0 pg   MCHC 31.5 30.0 - 36.0 g/dL   RDW 13.9 11.5 - 15.5 %   Platelets 276 150 - 400 K/uL   nRBC 0.0 0.0 - 0.2 %    Comment: Performed at [facility], [place] [address], [place], [place] [address]  Glucose, capillary     Status: Abnormal   Collection Time: 08/20/20 12:46 PM  Result Value Ref Range    Glucose-Capillary 203 (H) 70 - 99 mg/dL    Comment: Glucose reference range applies only to samples taken after fasting for at least 8 hours.  Glucose, capillary     Status: Abnormal   Collection Time: 08/20/20  6:13 PM  Result Value Ref Range   Glucose-Capillary 308 (H) 70 - 99 mg/dL    Comment: Glucose reference range applies only to samples taken after fasting for at least 8 hours.  Glucose, capillary     Status: Abnormal   Collection Time: 08/20/20 10:29 PM  Result Value Ref Range   Glucose-Capillary 101 (H) 70 - 99 mg/dL    Comment: Glucose reference range applies only to samples taken after fasting for at least 8 hours.  Culture, blood (routine x 2)     Status: None   Collection Time: 08/21/20 12:45 AM   Specimen: BLOOD  Result Value Ref Range   Specimen Description BLOOD RIGHT ANTECUBITAL    Special Requests      BOTTLES DRAWN AEROBIC AND ANAEROBIC Blood Culture adequate volume   Culture      NO GROWTH 5 DAYS Performed at [facility], [place] [address], [place], [place] [address]    Report Status 08/26/2020 FINAL   Renal function panel     Status: Abnormal   Collection Time: 08/21/20 12:48 AM  Result Value Ref  Range   Sodium 133 (L) 135 - 145 mmol/L   Potassium 4.0 3.5 - 5.1 mmol/L   Chloride 92 (L) 98 - 111 mmol/L   CO2 28 22 - 32 mmol/L   Glucose, Bld 171 (H) 70 - 99 mg/dL    Comment: Glucose reference range applies only to samples taken after fasting for at least 8 hours.   BUN 24 (H) 8 - 23 mg/dL   Creatinine, Ser 5.16 (H) 0.61 - 1.24 mg/dL   Calcium 9.3 8.9 - 10.3 mg/dL   Phosphorus 3.0 2.5 - 4.6 mg/dL   Albumin 2.7 (L) 3.5 - 5.0 g/dL   GFR, Estimated 12 (L) >60 mL/min    Comment: (NOTE) Calculated using the CKD-EPI Creatinine Equation (2021)    Anion gap 13 5 - 15    Comment: Performed at [facility] [address], [place], [place] [address]  CBC with Differential/Platelet     Status: Abnormal   Collection Time: 08/21/20 12:48 AM  Result Value  Ref Range   WBC 14.3 (H) 4.0 - 10.5 K/uL   RBC 4.64 4.22 - 5.81 MIL/uL   Hemoglobin 12.3 (L) 13.0 - 17.0 g/dL   HCT 39.2 39.0 - 52.0 %   MCV 84.5 80.0 - 100.0 fL   MCH 26.5 26.0 - 34.0 pg   MCHC 31.4 30.0 - 36.0 g/dL   RDW 13.9 11.5 - 15.5 %   Platelets 279 150 - 400 K/uL   nRBC 0.0 0.0 - 0.2 %   Neutrophils Relative % 83 %   Neutro Abs 11.9 (H) 1.7 - 7.7 K/uL   Lymphocytes Relative 7 %   Lymphs Abs 1.0 0.7 - 4.0 K/uL   Monocytes Relative 8 %   Monocytes Absolute 1.2 (H) 0.1 - 1.0 K/uL   Eosinophils Relative 1 %   Eosinophils Absolute 0.1 0.0 - 0.5 K/uL   Basophils Relative 0 %   Basophils Absolute 0.0 0.0 - 0.1 K/uL   Immature Granulocytes 1 %   Abs Immature Granulocytes 0.08 (H) 0.00 - 0.07 K/uL    Comment: Performed at [facility] [address], [place], [place] [address]  Culture, blood (routine x 2)     Status: None   Collection Time: 08/21/20 12:55 AM   Specimen: BLOOD RIGHT WRIST  Result Value Ref Range   Specimen Description BLOOD RIGHT WRIST    Special Requests      BOTTLES DRAWN AEROBIC AND ANAEROBIC Blood Culture adequate volume   Culture      NO GROWTH 5 DAYS Performed at [facility], [place] [address] [place],  [address]    Report Status 08/26/2020 FINAL   Lactic acid, plasma     Status: None   Collection Time: 08/21/20  4:42 AM  Result Value Ref Range   Lactic Acid, Venous 1.6 0.5 - 1.9 mmol/L    Comment: Performed at [facility] [address] [place], [place] [address]  Glucose, capillary     Status: Abnormal   Collection Time: 08/21/20  6:42 AM  Result Value Ref Range   Glucose-Capillary 207 (H) 70 - 99 mg/dL    Comment: Glucose reference range applies only to samples taken after fasting for at least 8 hours.  Lactic acid, plasma     Status: None   Collection Time: 08/21/20  7:13 AM  Result Value Ref Range   Lactic Acid, Venous 1.3 0.5 - 1.9 mmol/L    Comment: Performed at [facility]  [facility], [place] [address],  [place], [place] [address]  Glucose, capillary     Status: Abnormal   Collection Time: 08/21/20 11:33 AM  Result Value Ref Range   Glucose-Capillary 169 (H) 70 - 99 mg/dL    Comment: Glucose reference range applies only to samples taken after fasting for at least 8 hours.  Glucose, capillary     Status: Abnormal   Collection Time: 08/21/20  4:15 PM  Result Value Ref Range   Glucose-Capillary 190 (H) 70 - 99 mg/dL    Comment: Glucose reference range applies only to samples taken after fasting for at least 8 hours.  Glucose, capillary     Status: Abnormal   Collection Time: 08/21/20  8:44 PM  Result Value Ref Range   Glucose-Capillary 211 (H) 70 - 99 mg/dL    Comment: Glucose reference range applies only to samples taken after fasting for at least 8 hours.  Renal function panel     Status: Abnormal   Collection Time: 08/22/20  3:20 AM  Result Value Ref Range   Sodium 132 (L) 135 - 145 mmol/L   Potassium 4.0 3.5 - 5.1 mmol/L   Chloride 96 (L) 98 - 111 mmol/L   CO2 26 22 - 32 mmol/L   Glucose, Bld 247 (H) 70 - 99 mg/dL    Comment: Glucose reference range applies only to samples taken after fasting for at least 8 hours.   BUN 44 (H) 8 - 23 mg/dL   Creatinine, Ser 6.79 (H) 0.61 - 1.24 mg/dL   Calcium 9.3 8.9 - 10.3 mg/dL   Phosphorus 3.7 2.5 - 4.6 mg/dL   Albumin 2.3 (L) 3.5 - 5.0 g/dL   GFR, Estimated 8 (L) >60 mL/min    Comment: (NOTE) Calculated using the CKD-EPI Creatinine Equation (2021)    Anion gap 10 5 - 15    Comment: Performed at [facility] [address], [place], [place] [address]  Glucose, capillary     Status: Abnormal   Collection Time: 08/22/20  6:38 AM  Result Value Ref Range   Glucose-Capillary 163 (H) 70 - 99 mg/dL    Comment: Glucose reference range applies only to samples taken after fasting for at least 8 hours.  Glucose, capillary     Status: Abnormal   Collection Time: 08/22/20 11:39 AM  Result Value Ref Range   Glucose-Capillary 221 (H) 70 - 99 mg/dL     Comment: Glucose reference range applies only to samples taken after fasting for at least 8 hours.  Glucose, capillary     Status: Abnormal   Collection Time: 08/22/20  5:22 PM  Result Value Ref Range   Glucose-Capillary 185 (H) 70 - 99 mg/dL    Comment: Glucose reference range applies only to samples taken after fasting for at least 8 hours.  Glucose, capillary     Status: Abnormal   Collection Time: 08/22/20  9:55 PM  Result Value Ref Range   Glucose-Capillary 242 (H) 70 - 99 mg/dL    Comment: Glucose reference range applies only to samples taken after fasting for at least 8 hours.  Renal function panel     Status: Abnormal   Collection Time: 08/23/20  3:56 AM  Result Value Ref Range   Sodium 135 135 - 145 mmol/L   Potassium 4.2 3.5 - 5.1 mmol/L   Chloride 96 (L) 98 - 111 mmol/L   CO2 26 22 - 32 mmol/L   Glucose, Bld 185 (H) 70 -  99 mg/dL    Comment: Glucose reference range applies only to samples taken after fasting for at least 8 hours.   BUN 61 (H) 8 - 23 mg/dL   Creatinine, Ser 8.85 (H) 0.61 - 1.24 mg/dL   Calcium 9.4 8.9 - 10.3 mg/dL   Phosphorus 4.2 2.5 - 4.6 mg/dL   Albumin 2.3 (L) 3.5 - 5.0 g/dL   GFR, Estimated 6 (L) >60 mL/min    Comment: (NOTE) Calculated using the CKD-EPI Creatinine Equation (2021)    Anion gap 13 5 - 15    Comment: Performed at [facility] [address], [place], [place] [address]  Glucose, capillary     Status: Abnormal   Collection Time: 08/23/20  6:34 AM  Result Value Ref Range   Glucose-Capillary 139 (H) 70 - 99 mg/dL    Comment: Glucose reference range applies only to samples taken after fasting for at least 8 hours.  CBC     Status: Abnormal   Collection Time: 08/23/20  9:03 AM  Result Value Ref Range   WBC 8.9 4.0 - 10.5 K/uL   RBC 3.76 (L) 4.22 - 5.81 MIL/uL   Hemoglobin 10.0 (L) 13.0 - 17.0 g/dL   HCT 31.3 (L) 39.0 - 52.0 %   MCV 83.2 80.0 - 100.0 fL   MCH 26.6 26.0 - 34.0 pg   MCHC 31.9 30.0 - 36.0 g/dL   RDW 13.8  11.5 - 15.5 %   Platelets 251 150 - 400 K/uL   nRBC 0.0 0.0 - 0.2 %    Comment: Performed at [facility], [place] [address] [place], [place] [address]  Glucose, capillary     Status: Abnormal   Collection Time: 08/23/20  1:02 PM  Result Value Ref Range   Glucose-Capillary 178 (H) 70 - 99 mg/dL    Comment: Glucose reference range applies only to samples taken after fasting for at least 8 hours.  Glucose, capillary     Status: Abnormal   Collection Time: 08/23/20  4:17 PM  Result Value Ref Range   Glucose-Capillary 224 (H) 70 - 99 mg/dL    Comment: Glucose reference range applies only to samples taken after fasting for at least 8 hours.  Glucose, capillary     Status: Abnormal   Collection Time: 08/23/20 10:04 PM  Result Value Ref Range   Glucose-Capillary 197 (H) 70 - 99 mg/dL    Comment: Glucose reference range applies only to samples taken after fasting for at least 8 hours.  Renal function panel     Status: Abnormal   Collection Time: 08/24/20  5:49 AM  Result Value Ref Range   Sodium 131 (L) 135 - 145 mmol/L   Potassium 4.1 3.5 - 5.1 mmol/L   Chloride 94 (L) 98 - 111 mmol/L   CO2 27 22 - 32 mmol/L   Glucose, Bld 161 (H) 70 - 99 mg/dL    Comment: Glucose reference range applies only to samples taken after fasting for at least 8 hours.   BUN 26 (H) 8 - 23 mg/dL   Creatinine, Ser 5.07 (H) 0.61 - 1.24 mg/dL    Comment: DELTA CHECK NOTED   Calcium 8.6 (L) 8.9 - 10.3 mg/dL   Phosphorus 3.7 2.5 - 4.6 mg/dL   Albumin 2.3 (L) 3.5 - 5.0 g/dL   GFR, Estimated 12 (L) >60 mL/min    Comment: (NOTE) Calculated using the CKD-EPI Creatinine Equation (2021)    Anion gap 10 5 - 15  Comment: Performed at [facility], [place] [address], [place], [place] [address]  CBC     Status: Abnormal   Collection Time: 08/24/20  5:49 AM  Result Value Ref Range   WBC 8.9 4.0 - 10.5 K/uL   RBC 4.11 (L) 4.22 - 5.81 MIL/uL   Hemoglobin 10.8 (L) 13.0 - 17.0 g/dL   HCT 33.5 (L) 39.0 - 52.0 %    MCV 81.5 80.0 - 100.0 fL   MCH 26.3 26.0 - 34.0 pg   MCHC 32.2 30.0 - 36.0 g/dL   RDW 13.7 11.5 - 15.5 %   Platelets 256 150 - 400 K/uL   nRBC 0.0 0.0 - 0.2 %    Comment: Performed at [facility], [place] [address] [place], [place] [address]  Glucose, capillary     Status: Abnormal   Collection Time: 08/24/20  6:45 AM  Result Value Ref Range   Glucose-Capillary 164 (H) 70 - 99 mg/dL    Comment: Glucose reference range applies only to samples taken after fasting for at least 8 hours.  Glucose, capillary     Status: None   Collection Time: 08/24/20 11:36 AM  Result Value Ref Range   Glucose-Capillary 90 70 - 99 mg/dL    Comment: Glucose reference range applies only to samples taken after fasting for at least 8 hours.  Blood gas, arterial     Status: Abnormal   Collection Time: 09/28/20  1:26 PM  Result Value Ref Range   FIO2 28.00    pH, Arterial 7.444 7.350 - 7.450   pCO2 arterial 44.4 32.0 - 48.0 mmHg   pO2, Arterial 131 (H) 83.0 - 108.0 mmHg   Bicarbonate 29.9 (H) 20.0 - 28.0 mmol/L   Acid-Base Excess 5.9 (H) 0.0 - 2.0 mmol/L   O2 Saturation 98.6 %   Patient temperature 37.0    Collection site REVIEWED BY    Drawn by [id number]    Sample type ARTERIAL DRAW    Allens test (pass/fail) BRACHIAL ARTERY (A) PASS    Comment: Performed at [facility], [place] [address], [place], [place] [address]  I-STAT, [place] Clap 8     Status: Abnormal   Collection Time: 10/12/20 11:02 AM  Result Value Ref Range   Sodium 135 135 - 145 mmol/L   Potassium 4.7 3.5 - 5.1 mmol/L   Chloride 95 (L) 98 - 111 mmol/L   BUN 65 (H) 8 - 23 mg/dL   Creatinine, Ser 6.90 (H) 0.61 - 1.24 mg/dL   Glucose, Bld 204 (H) 70 - 99 mg/dL    Comment: Glucose reference range applies only to samples taken after fasting for at least 8 hours.   Calcium, Ion 0.97 (L) 1.15 - 1.40 mmol/L   TCO2 34 (H) 22 - 32 mmol/L   Hemoglobin 12.2 (L) 13.0 - 17.0 g/dL   HCT 36.0 (L) 39.0 - 52.0 %  Glucose, capillary     Status: Abnormal    Collection Time: 10/12/20  2:53 PM  Result Value Ref Range   Glucose-Capillary 146 (H) 70 - 99 mg/dL    Comment: Glucose reference range applies only to samples taken after fasting for at least 8 hours.  MYOCARDIAL PERFUSION IMAGING     Status: None   Collection Time: 10/19/20  1:18 PM  Result Value Ref Range   Rest HR 80 bpm   Rest BP 100/64 mmHg   Peak HR 81 bpm   Peak BP 98/62 mmHg   SSS 13  SRS 12    SDS 1    TID 1.01    LV sys vol 270 mL   LV dias vol 310 62 - 150 mL  I-STAT, chem 8     Status: Abnormal   Collection Time: 10/28/20  7:30 AM  Result Value Ref Range   Sodium 134 (L) 135 - 145 mmol/L   Potassium 5.5 (H) 3.5 - 5.1 mmol/L   Chloride 94 (L) 98 - 111 mmol/L   BUN 65 (H) 8 - 23 mg/dL   Creatinine, Ser 7.30 (H) 0.61 - 1.24 mg/dL   Glucose, Bld 173 (H) 70 - 99 mg/dL    Comment: Glucose reference range applies only to samples taken after fasting for at least 8 hours.   Calcium, Ion 0.93 (L) 1.15 - 1.40 mmol/L   TCO2 32 22 - 32 mmol/L   Hemoglobin 11.6 (L) 13.0 - 17.0 g/dL   HCT 34.0 (L) 39.0 - 52.0 %  POCT Activated clotting time     Status: None   Collection Time: 10/28/20  2:10 PM  Result Value Ref Range   Activated Clotting Time 283 seconds  Type and screen [place]     Status: None   Collection Time: 10/28/20  3:45 PM  Result Value Ref Range   ABO/RH(D) A POS    Antibody Screen NEG    Sample Expiration      10/31/2020,2359 Performed at [facility], [place] [address], [place], [place] [address]   I-STAT 7, (LYTES, BLD GAS, ICA, H+H)     Status: Abnormal   Collection Time: 10/28/20  3:51 PM  Result Value Ref Range   pH, Arterial 7.552 (H) 7.350 - 7.450   pCO2 arterial 36.4 32.0 - 48.0 mmHg   pO2, Arterial 338 (H) 83.0 - 108.0 mmHg   Bicarbonate 32.3 (H) 20.0 - 28.0 mmol/L   TCO2 33 (H) 22 - 32 mmol/L   O2 Saturation 100.0 %   Acid-Base Excess 9.0 (H) 0.0 - 2.0 mmol/L   Sodium 136 135 - 145 mmol/L   Potassium 3.6 3.5 - 5.1  mmol/L   Calcium, Ion 1.05 (L) 1.15 - 1.40 mmol/L   HCT 33.0 (L) 39.0 - 52.0 %   Hemoglobin 11.2 (L) 13.0 - 17.0 g/dL   Patient temperature 36.0 C    Sample type ARTERIAL   Glucose, capillary     Status: Abnormal   Collection Time: 10/28/20  6:00 PM  Result Value Ref Range   Glucose-Capillary 139 (H) 70 - 99 mg/dL    Comment: Glucose reference range applies only to samples taken after fasting for at least 8 hours.  Hemoglobin A1c     Status: Abnormal   Collection Time: 10/28/20  8:30 PM  Result Value Ref Range   Hgb A1c MFr Bld 9.5 (H) 4.8 - 5.6 %    Comment: (NOTE)         Prediabetes: 5.7 - 6.4         Diabetes: >6.4         Glycemic control for adults with diabetes: <7.0    Mean Plasma Glucose 226 mg/dL    Comment: (NOTE) Performed At: [facility] [place], [place] [id number] Rush Farmer MD UK:[id number]   Iron and TIBC     Status: None   Collection Time: 10/28/20  8:30 PM  Result Value Ref Range   Iron 65 45 - 182 ug/dL   TIBC 330 250 - 450 ug/dL  Saturation Ratios 20 17.9 - 39.5 %   UIBC 265 ug/dL    Comment: Performed at [facility], [place] [address] [place], [place] [address]  Glucose, capillary     Status: Abnormal   Collection Time: 10/28/20  8:47 PM  Result Value Ref Range   Glucose-Capillary 156 (H) 70 - 99 mg/dL    Comment: Glucose reference range applies only to samples taken after fasting for at least 8 hours.  CBC with Differential/Platelet     Status: Abnormal   Collection Time: 10/29/20  5:30 AM  Result Value Ref Range   WBC 12.3 (H) 4.0 - 10.5 K/uL   RBC 3.67 (L) 4.22 - 5.81 MIL/uL   Hemoglobin 8.7 (L) 13.0 - 17.0 g/dL    Comment: Reticulocyte Hemoglobin testing may be clinically indicated, consider ordering this additional test [id number]    HCT 28.2 (L) 39.0 - 52.0 %   MCV 76.8 (L) 80.0 - 100.0 fL   MCH 23.7 (L) 26.0 - 34.0 pg   MCHC 30.9 30.0 - 36.0 g/dL   RDW 16.1 (H) 11.5 - 15.5 %   Platelets 177 150 - 400  K/uL   nRBC 0.0 0.0 - 0.2 %   Neutrophils Relative % 81 %   Neutro Abs 9.9 (H) 1.7 - 7.7 K/uL   Lymphocytes Relative 11 %   Lymphs Abs 1.3 0.7 - 4.0 K/uL   Monocytes Relative 8 %   Monocytes Absolute 1.0 0.1 - 1.0 K/uL   Eosinophils Relative 0 %   Eosinophils Absolute 0.0 0.0 - 0.5 K/uL   Basophils Relative 0 %   Basophils Absolute 0.1 0.0 - 0.1 K/uL   Immature Granulocytes 0 %   Abs Immature Granulocytes 0.05 0.00 - 0.07 K/uL    Comment: Performed at [facility], [address], [place],  [address]  Basic metabolic panel     Status: Abnormal   Collection Time: 10/29/20  5:30 AM  Result Value Ref Range   Sodium 136 135 - 145 mmol/L   Potassium 4.3 3.5 - 5.1 mmol/L   Chloride 95 (L) 98 - 111 mmol/L   CO2 26 22 - 32 mmol/L   Glucose, Bld 222 (H) 70 - 99 mg/dL    Comment: Glucose reference range applies only to samples taken after fasting for at least 8 hours.   BUN 51 (H) 8 - 23 mg/dL   Creatinine, Ser 8.36 (H) 0.61 - 1.24 mg/dL   Calcium 8.5 (L) 8.9 - 10.3 mg/dL   GFR, Estimated 7 (L) >60 mL/min    Comment: (NOTE) Calculated using the CKD-EPI Creatinine Equation (2021)    Anion gap 15 5 - 15    Comment: Performed at [facility] [address], [place], [place] [address]  Reticulocytes     Status: Abnormal   Collection Time: 10/29/20  5:30 AM  Result Value Ref Range   Retic Ct Pct 1.8 0.4 - 3.1 %   RBC. 3.70 (L) 4.22 - 5.81 MIL/uL   Retic Count, Absolute 65.9 19.0 - 186.0 K/uL   Immature Retic Fract 29.4 (H) 2.3 - 15.9 %    Comment: Performed at [facility] [address] [place],  [address]  Glucose, capillary     Status: Abnormal   Collection Time: 10/29/20  5:53 AM  Result Value Ref Range   Glucose-Capillary 186 (H) 70 - 99 mg/dL    Comment: Glucose reference range applies only to samples taken after fasting  for at least 8 hours.  Resp Panel by RT-PCR (Flu A&B, Covid) Nasopharyngeal Swab     Status: None   Collection Time: 10/29/20   8:27 AM   Specimen: Nasopharyngeal Swab; Nasopharyngeal(NP) swabs in vial transport medium  Result Value Ref Range   SARS Coronavirus 2 by RT PCR NEGATIVE NEGATIVE    Comment: (NOTE) SARS-CoV-2 target nucleic acids are NOT DETECTED.  The SARS-CoV-2 RNA is generally detectable in upper respiratory specimens during the acute phase of infection. The lowest concentration of SARS-CoV-2 viral copies this assay can detect is 138 copies/mL. A negative result does not preclude SARS-Cov-2 infection and should not be used as the sole basis for treatment or other patient management decisions. A negative result may occur with  improper specimen collection/handling, submission of specimen other than nasopharyngeal swab, presence of viral mutation(s) within the areas targeted by this assay, and inadequate number of viral copies(<138 copies/mL). A negative result must be combined with clinical observations, patient history, and epidemiological information. The expected result is Negative.  Fact Sheet for Patients:  EntrepreneurPulse.com.au  Fact Sheet for Healthcare Providers:  IncredibleEmployment.be  This test is no t yet approved or cleared by the [place] FDA and  has been authorized for detection and/or diagnosis of SARS-CoV-2 by FDA under an Emergency Use Authorization (EUA). This EUA will remain  in effect (meaning this test can be used) for the duration of the COVID-19 declaration under Section 564(b)(1) of the Act, 21 U.S.C.section 360bbb-3(b)(1), unless the authorization is terminated  or revoked sooner.       Influenza A by PCR NEGATIVE NEGATIVE   Influenza B by PCR NEGATIVE NEGATIVE    Comment: (NOTE) The Xpert Xpress SARS-CoV-2/FLU/RSV plus assay is intended as an aid in the diagnosis of influenza from Nasopharyngeal swab specimens and should not be used as a sole basis for treatment. Nasal washings and aspirates are unacceptable for  Xpert Xpress SARS-CoV-2/FLU/RSV testing.  Fact Sheet for Patients: EntrepreneurPulse.com.au  Fact Sheet for Healthcare Providers: IncredibleEmployment.be  This test is not yet approved or cleared by the [place] FDA and has been authorized for detection and/or diagnosis of SARS-CoV-2 by FDA under an Emergency Use Authorization (EUA). This EUA will remain in effect (meaning this test can be used) for the duration of the COVID-19 declaration under Section 564(b)(1) of the Act, 21 U.S.C. section 360bbb-3(b)(1), unless the authorization is terminated or revoked.  Performed at [facility], [place] [address], [place], [place] [address]   CBC     Status: Abnormal   Collection Time: 10/29/20  8:48 AM  Result Value Ref Range   WBC 13.3 (H) 4.0 - 10.5 K/uL   RBC 3.84 (L) 4.22 - 5.81 MIL/uL   Hemoglobin 9.1 (L) 13.0 - 17.0 g/dL   HCT 30.1 (L) 39.0 - 52.0 %   MCV 78.4 (L) 80.0 - 100.0 fL   MCH 23.7 (L) 26.0 - 34.0 pg   MCHC 30.2 30.0 - 36.0 g/dL   RDW 16.6 (H) 11.5 - 15.5 %   Platelets 173 150 - 400 K/uL   nRBC 0.0 0.0 - 0.2 %    Comment: Performed at [facility] [address], [place], [place] [address]  Hemoglobin A1c     Status: Abnormal   Collection Time: 10/29/20  8:48 AM  Result Value Ref Range   Hgb A1c MFr Bld 9.4 (H) 4.8 - 5.6 %    Comment: (NOTE) Pre diabetes:  5.7%-6.4%  Diabetes:              >6.4%  Glycemic control for   <7.0% adults with diabetes    Mean Plasma Glucose 223.08 mg/dL    Comment: Performed at [facility] [address], [place], [place] [address]  Glucose, capillary     Status: Abnormal   Collection Time: 10/29/20 11:53 AM  Result Value Ref Range   Glucose-Capillary 264 (H) 70 - 99 mg/dL    Comment: Glucose reference range applies only to samples taken after fasting for at least 8 hours.  Hepatitis B surface antigen     Status: None   Collection Time: 10/29/20  2:11 PM  Result  Value Ref Range   Hepatitis B Surface Ag NON REACTIVE NON REACTIVE    Comment: Performed at [facility], [address], [place], [place] [address]  Hepatitis B surface antibody,qualitative     Status: Abnormal   Collection Time: 10/29/20  2:11 PM  Result Value Ref Range   Hep B S Ab Reactive (A) NON REACTIVE    Comment: (NOTE) Consistent with immunity, greater than 9.9 mIU/mL.  Performed at [facility], [place] [address] [place],  [address]   Hepatitis B core antibody, total     Status: None   Collection Time: 10/29/20  2:11 PM  Result Value Ref Range   Hep B Core Total Ab NON REACTIVE NON REACTIVE    Comment: Performed at [facility] [address] [place], [place] [address]  Glucose, capillary     Status: Abnormal   Collection Time: 10/29/20  6:46 PM  Result Value Ref Range   Glucose-Capillary 199 (H) 70 - 99 mg/dL    Comment: Glucose reference range applies only to samples taken after fasting for at least 8 hours.   Comment 1 Notify RN    Comment 2 Document in Chart   Glucose, capillary     Status: Abnormal   Collection Time: 10/29/20  9:18 PM  Result Value Ref Range   Glucose-Capillary 251 (H) 70 - 99 mg/dL    Comment: Glucose reference range applies only to samples taken after fasting for at least 8 hours.  Glucose, capillary     Status: Abnormal   Collection Time: 10/30/20  6:19 AM  Result Value Ref Range   Glucose-Capillary 220 (H) 70 - 99 mg/dL    Comment: Glucose reference range applies only to samples taken after fasting for at least 8 hours.  Glucose, capillary     Status: Abnormal   Collection Time: 10/30/20 12:37 PM  Result Value Ref Range   Glucose-Capillary 230 (H) 70 - 99 mg/dL    Comment: Glucose reference range applies only to samples taken after fasting for at least 8 hours.  Glucose, capillary     Status: Abnormal   Collection Time: 10/30/20  5:11 PM  Result Value Ref Range   Glucose-Capillary 183 (H) 70 - 99 mg/dL     Comment: Glucose reference range applies only to samples taken after fasting for at least 8 hours.  Resp Panel by RT-PCR (Flu A&B, Covid) Nasopharyngeal Swab     Status: None   Collection Time: 11/28/2020 11:22 AM   Specimen: Nasopharyngeal Swab; Nasopharyngeal(NP) swabs in vial transport medium  Result Value Ref Range   SARS Coronavirus 2 by RT PCR NEGATIVE NEGATIVE    Comment: (NOTE) SARS-CoV-2 target nucleic acids are NOT DETECTED.  The SARS-CoV-2 RNA is generally detectable  in upper respiratory specimens during the acute phase of infection. The lowest concentration of SARS-CoV-2 viral copies this assay can detect is 138 copies/mL. A negative result does not preclude SARS-Cov-2 infection and should not be used as the sole basis for treatment or other patient management decisions. A negative result may occur with  improper specimen collection/handling, submission of specimen other than nasopharyngeal swab, presence of viral mutation(s) within the areas targeted by this assay, and inadequate number of viral copies(<138 copies/mL). A negative result must be combined with clinical observations, patient history, and epidemiological information. The expected result is Negative.  Fact Sheet for Patients:  EntrepreneurPulse.com.au  Fact Sheet for Healthcare Providers:  IncredibleEmployment.be  This test is no t yet approved or cleared by the [place] FDA and  has been authorized for detection and/or diagnosis of SARS-CoV-2 by FDA under an Emergency Use Authorization (EUA). This EUA will remain  in effect (meaning this test can be used) for the duration of the COVID-19 declaration under Section 564(b)(1) of the Act, 21 U.S.C.section 360bbb-3(b)(1), unless the authorization is terminated  or revoked sooner.       Influenza A by PCR NEGATIVE NEGATIVE   Influenza B by PCR NEGATIVE NEGATIVE    Comment: (NOTE) The Xpert Xpress SARS-CoV-2/FLU/RSV  plus assay is intended as an aid in the diagnosis of influenza from Nasopharyngeal swab specimens and should not be used as a sole basis for treatment. Nasal washings and aspirates are unacceptable for Xpert Xpress SARS-CoV-2/FLU/RSV testing.  Fact Sheet for Patients: EntrepreneurPulse.com.au  Fact Sheet for Healthcare Providers: IncredibleEmployment.be  This test is not yet approved or cleared by the [place] FDA and has been authorized for detection and/or diagnosis of SARS-CoV-2 by FDA under an Emergency Use Authorization (EUA). This EUA will remain in effect (meaning this test can be used) for the duration of the COVID-19 declaration under Section 564(b)(1) of the Act, 21 U.S.C. section 360bbb-3(b)(1), unless the authorization is terminated or revoked.  Performed at [facility], [place] [address], [place], [place] [address]   Basic metabolic panel     Status: Abnormal   Collection Time: 10/30/2020 11:30 AM  Result Value Ref Range   Sodium 134 (L) 135 - 145 mmol/L   Potassium 3.6 3.5 - 5.1 mmol/L   Chloride 92 (L) 98 - 111 mmol/L   CO2 26 22 - 32 mmol/L   Glucose, Bld 135 (H) 70 - 99 mg/dL    Comment: Glucose reference range applies only to samples taken after fasting for at least 8 hours.   BUN 16 8 - 23 mg/dL   Creatinine, Ser 3.28 (H) 0.61 - 1.24 mg/dL   Calcium 8.1 (L) 8.9 - 10.3 mg/dL   GFR, Estimated 20 (L) >60 mL/min    Comment: (NOTE) Calculated using the CKD-EPI Creatinine Equation (2021)    Anion gap 16 (H) 5 - 15    Comment: Performed at [facility] [address], [place], [place] [address]  CBC with Differential     Status: Abnormal   Collection Time: 11/17/2020 11:30 AM  Result Value Ref Range   WBC 11.5 (H) 4.0 - 10.5 K/uL   RBC 3.53 (L) 4.22 - 5.81 MIL/uL   Hemoglobin 8.5 (L) 13.0 - 17.0 g/dL    Comment: Reticulocyte Hemoglobin testing may be clinically indicated, consider ordering this  additional test [id number]    HCT 26.7 (L) 39.0 - 52.0 %   MCV 75.6 (L) 80.0 - 100.0 fL  MCH 24.1 (L) 26.0 - 34.0 pg   MCHC 31.8 30.0 - 36.0 g/dL   RDW 17.7 (H) 11.5 - 15.5 %   Platelets 181 150 - 400 K/uL   nRBC 0.3 (H) 0.0 - 0.2 %   Neutrophils Relative % 76 %   Neutro Abs 8.9 (H) 1.7 - 7.7 K/uL   Lymphocytes Relative 14 %   Lymphs Abs 1.6 0.7 - 4.0 K/uL   Monocytes Relative 9 %   Monocytes Absolute 1.0 0.1 - 1.0 K/uL   Eosinophils Relative 0 %   Eosinophils Absolute 0.0 0.0 - 0.5 K/uL   Basophils Relative 0 %   Basophils Absolute 0.0 0.0 - 0.1 K/uL   Immature Granulocytes 1 %   Abs Immature Granulocytes 0.06 0.00 - 0.07 K/uL    Comment: Performed at [facility] [address], [place], [place] [address]  Protime-INR     Status: Abnormal   Collection Time: 10/29/2020 11:30 AM  Result Value Ref Range   Prothrombin Time 18.8 (H) 11.4 - 15.2 seconds   INR 1.6 (H) 0.8 - 1.2    Comment: (NOTE) INR goal varies based on device and disease states. Performed at [facility], [place] [address] [place], [place] [address]   APTT     Status: Abnormal   Collection Time: 11/13/2020 11:54 AM  Result Value Ref Range   aPTT 39 (H) 24 - 36 seconds    Comment:        IF BASELINE aPTT IS ELEVATED, SUGGEST PATIENT RISK ASSESSMENT BE USED TO DETERMINE APPROPRIATE ANTICOAGULANT THERAPY. Performed at [facility], [place] [address] [place], [place] [address]   Glucose, capillary     Status: Abnormal   Collection Time: 11/10/2020  2:39 PM  Result Value Ref Range   Glucose-Capillary 111 (H) 70 - 99 mg/dL    Comment: Glucose reference range applies only to samples taken after fasting for at least 8 hours.  Glucose, capillary     Status: None   Collection Time: 11/27/2020  4:36 PM  Result Value Ref Range   Glucose-Capillary 77 70 - 99 mg/dL    Comment: Glucose reference range applies only to samples taken after fasting for at least 8 hours.  I-STAT 7, (LYTES, BLD GAS, ICA, H+H)      Status: Abnormal   Collection Time: 11/15/2020  4:38 PM  Result Value Ref Range   pH, Arterial 7.410 7.350 - 7.450   pCO2 arterial 27.5 (L) 32.0 - 48.0 mmHg   pO2, Arterial 188 (H) 83.0 - 108.0 mmHg   Bicarbonate 17.6 (L) 20.0 - 28.0 mmol/L   TCO2 19 (L) 22 - 32 mmol/L   O2 Saturation 100.0 %   Acid-base deficit 6.0 (H) 0.0 - 2.0 mmol/L   Sodium 134 (L) 135 - 145 mmol/L   Potassium 4.1 3.5 - 5.1 mmol/L   Calcium, Ion 0.91 (L) 1.15 - 1.40 mmol/L   HCT 29.0 (L) 39.0 - 52.0 %   Hemoglobin 9.9 (L) 13.0 - 17.0 g/dL   Patient temperature 36.0 C    Sample type ARTERIAL   Troponin I (High Sensitivity)     Status: Abnormal   Collection Time: 11/14/2020  5:00 PM  Result Value Ref Range   Troponin I (High Sensitivity) 590 (HH) <18 ng/L    Comment: CRITICAL RESULT CALLED TO, READ BACK BY AND VERIFIED WITH: DR Russella Dar 2725 11/02/2020 WBOND (NOTE) Elevated high sensitivity troponin I (hsTnI) values and significant  changes across  serial measurements may suggest ACS but many other  chronic and acute conditions are known to elevate hsTnI results.  Refer to the Links section for chest pain algorithms and additional  guidance. Performed at [facility], [place] [address] [place], [place] [address]   Basic metabolic panel     Status: Abnormal   Collection Time: 11/04/2020  5:00 PM  Result Value Ref Range   Sodium 134 (L) 135 - 145 mmol/L   Potassium 4.3 3.5 - 5.1 mmol/L   Chloride 96 (L) 98 - 111 mmol/L   CO2 13 (L) 22 - 32 mmol/L   Glucose, Bld 54 (L) 70 - 99 mg/dL    Comment: Glucose reference range applies only to samples taken after fasting for at least 8 hours.   BUN 21 8 - 23 mg/dL   Creatinine, Ser 3.91 (H) 0.61 - 1.24 mg/dL   Calcium 8.0 (L) 8.9 - 10.3 mg/dL   GFR, Estimated 16 (L) >60 mL/min    Comment: (NOTE) Calculated using the CKD-EPI Creatinine Equation (2021)    Anion gap 25 (H) 5 - 15    Comment: REPEATED TO VERIFY Performed at [facility] [address]  [place], [place] [address]   MRSA Next Gen by PCR, Nasal     Status: None   Collection Time: 10/31/2020  6:54 PM   Specimen: Nasal Mucosa; Nasal Swab  Result Value Ref Range   MRSA by PCR Next Gen NOT DETECTED NOT DETECTED    Comment: (NOTE) The GeneXpert MRSA Assay (FDA approved for NASAL specimens only), is one component of a comprehensive MRSA colonization surveillance program. It is not intended to diagnose MRSA infection nor to guide or monitor treatment for MRSA infections. Test performance is not FDA approved in patients less than 22 years old. Performed at [facility], [place] [address] [place], [place] [address]   ECHOCARDIOGRAM COMPLETE     Status: None   Collection Time: 11/23/2020  8:10 PM  Result Value Ref Range   Weight 2,102.31 oz   Height 66 in   BP 122/81 mmHg   S' Lateral 4.90 cm   AR max vel 1.60 cm2   AV Area VTI 1.47 cm2   AV Mean grad 3.0 mmHg   AV Peak grad 6.5 mmHg   Ao pk vel 1.27 m/s   Radius 0.50 cm   MV M vel 4.37 m/s   AV Area mean vel 1.57 cm2   MV Peak grad 76.4 mmHg  Glucose, capillary     Status: Abnormal   Collection Time: 11/24/2020  8:12 PM  Result Value Ref Range   Glucose-Capillary 23 (LL) 70 - 99 mg/dL    Comment: Glucose reference range applies only to samples taken after fasting for at least 8 hours.   Comment 1 Notify RN   Heparin level (unfractionated)     Status: Abnormal   Collection Time: 11/06/2020  8:14 PM  Result Value Ref Range   Heparin Unfractionated 0.16 (L) 0.30 - 0.70 IU/mL    Comment: (NOTE) The clinical reportable range upper limit is being lowered to >1.10 to align with the FDA approved guidance for the current laboratory assay.  If heparin results are below expected values, and patient dosage has  been confirmed, suggest follow up testing of antithrombin III levels. Performed at [facility], [place] [address], [place], [place] [address]   Lactic acid, plasma     Status: Abnormal   Collection Time:  11/02/2020   8:14 PM  Result Value Ref Range   Lactic Acid, Venous >11.0 (HH) 0.5 - 1.9 mmol/L    Comment: CRITICAL RESULT CALLED TO, READ BACK BY AND VERIFIED WITH: Shela Commons 2111 11/14/2020 WBOND Performed at [facility], [place] [address], [place], [place] [address]   Troponin I (High Sensitivity)     Status: Abnormal   Collection Time: 11/27/2020  8:14 PM  Result Value Ref Range   Troponin I (High Sensitivity) 648 (HH) <18 ng/L    Comment: CRITICAL VALUE NOTED.  VALUE IS CONSISTENT WITH PREVIOUSLY REPORTED AND CALLED VALUE. (NOTE) Elevated high sensitivity troponin I (hsTnI) values and significant  changes across serial measurements may suggest ACS but many other  chronic and acute conditions are known to elevate hsTnI results.  Refer to the Links section for chest pain algorithms and additional  guidance. Performed at [facility], [place] [address], [place], [place] [address]   Glucose, capillary     Status: Abnormal   Collection Time: 11/17/2020  9:21 PM  Result Value Ref Range   Glucose-Capillary 38 (LL) 70 - 99 mg/dL    Comment: Glucose reference range applies only to samples taken after fasting for at least 8 hours.   Comment 1 Notify RN   Glucose, capillary     Status: Abnormal   Collection Time: 11/09/2020  9:59 PM  Result Value Ref Range   Glucose-Capillary 171 (H) 70 - 99 mg/dL    Comment: Glucose reference range applies only to samples taken after fasting for at least 8 hours.  CBC     Status: Abnormal   Collection Time: 11/02/20 12:07 AM  Result Value Ref Range   WBC 15.7 (H) 4.0 - 10.5 K/uL   RBC 3.25 (L) 4.22 - 5.81 MIL/uL   Hemoglobin 7.8 (L) 13.0 - 17.0 g/dL    Comment: Reticulocyte Hemoglobin testing may be clinically indicated, consider ordering this additional test [id number]    HCT 25.3 (L) 39.0 - 52.0 %   MCV 77.8 (L) 80.0 - 100.0 fL   MCH 24.0 (L) 26.0 - 34.0 pg   MCHC 30.8 30.0 - 36.0 g/dL   RDW 17.9 (H) 11.5 - 15.5 %   Platelets 173 150 - 400 K/uL    nRBC 0.3 (H) 0.0 - 0.2 %    Comment: Performed at [facility], [place] [address] [place], [place] [address]  Lactic acid, plasma     Status: Abnormal   Collection Time: 11/02/20 12:07 AM  Result Value Ref Range   Lactic Acid, Venous 10.0 (HH) 0.5 - 1.9 mmol/L    Comment: CRITICAL VALUE NOTED.  VALUE IS CONSISTENT WITH PREVIOUSLY REPORTED AND CALLED VALUE. Performed at [facility], [place] [address], [place], [place] [address]   Comprehensive metabolic panel     Status: Abnormal   Collection Time: 11/02/20 12:07 AM  Result Value Ref Range   Sodium 135 135 - 145 mmol/L   Potassium 4.0 3.5 - 5.1 mmol/L   Chloride 93 (L) 98 - 111 mmol/L   CO2 19 (L) 22 - 32 mmol/L   Glucose, Bld 170 (H) 70 - 99 mg/dL    Comment: Glucose reference range applies only to samples taken after fasting for at least 8 hours.   BUN 25 (H) 8 - 23 mg/dL   Creatinine, Ser 4.31 (H) 0.61 - 1.24 mg/dL   Calcium 8.2 (L) 8.9 - 10.3 mg/dL   Total Protein 5.7 (L) 6.5 -  8.1 g/dL   Albumin 2.8 (L) 3.5 - 5.0 g/dL   AST 3,226 (H) 15 - 41 U/L    Comment: RESULTS CONFIRMED BY MANUAL DILUTION   ALT 1,193 (H) 0 - 44 U/L   Alkaline Phosphatase 540 (H) 38 - 126 U/L   Total Bilirubin 3.4 (H) 0.3 - 1.2 mg/dL   GFR, Estimated 15 (L) >60 mL/min    Comment: (NOTE) Calculated using the CKD-EPI Creatinine Equation (2021)    Anion gap 23 (H) 5 - 15    Comment: REPEATED TO VERIFY Performed at [facility] [address], [place], [place] [address]   Protime-INR     Status: Abnormal   Collection Time: 11/02/20 12:07 AM  Result Value Ref Range   Prothrombin Time 26.2 (H) 11.4 - 15.2 seconds   INR 2.4 (H) 0.8 - 1.2    Comment: (NOTE) INR goal varies based on device and disease states. Performed at [facility], [place] [address], [place], [place] [address]   Magnesium     Status: None   Collection Time: 11/02/20 12:07 AM  Result Value Ref Range   Magnesium 2.2 1.7 - 2.4 mg/dL    Comment: Performed at [facility] [address] [place], [place] [address]  Phosphorus     Status: Abnormal   Collection Time: 11/02/20 12:07 AM  Result Value Ref Range   Phosphorus 7.2 (H) 2.5 - 4.6 mg/dL    Comment: Performed at [facility] [address] [place], [place] [address]  Heparin level (unfractionated)     Status: Abnormal   Collection Time: 11/02/20 12:07 AM  Result Value Ref Range   Heparin Unfractionated 0.12 (L) 0.30 - 0.70 IU/mL    Comment: (NOTE) The clinical reportable range upper limit is being lowered to >1.10 to align with the FDA approved guidance for the current laboratory assay.  If heparin results are below expected values, and patient dosage has  been confirmed, suggest follow up testing of antithrombin III levels. Performed at [facility], [place] [address] [place], [place] [address]   Troponin I (High Sensitivity)     Status: Abnormal   Collection Time: 11/02/20 12:07 AM  Result Value Ref Range   Troponin I (High Sensitivity) 793 (HH) <18 ng/L    Comment: CRITICAL VALUE NOTED.  VALUE IS CONSISTENT WITH PREVIOUSLY REPORTED AND CALLED VALUE. (NOTE) Elevated high sensitivity troponin I (hsTnI) values and significant  changes across serial measurements may suggest ACS but many other  chronic and acute conditions are known to elevate hsTnI results.  Refer to the Links section for chest pain algorithms and additional  guidance. Performed at [facility], [place] [address] [place], [place] [address]   Glucose, capillary     Status: Abnormal   Collection Time: 11/02/20  3:15 AM  Result Value Ref Range   Glucose-Capillary 136 (H) 70 - 99 mg/dL    Comment: Glucose reference range applies only to samples taken after fasting for at least 8 hours.  Heparin level (unfractionated)     Status: Abnormal   Collection Time: 11/02/20  5:40 AM  Result Value Ref Range   Heparin Unfractionated 0.13 (L) 0.30 - 0.70 IU/mL    Comment: (NOTE) The clinical reportable  range upper limit is being lowered to >1.10 to align with the FDA approved guidance for the current laboratory assay.  If heparin results are below expected values, and patient dosage has  been  confirmed, suggest follow up testing of antithrombin III levels. Performed at [facility], [place] [address], [place], [place] [address]   Troponin I (High Sensitivity)     Status: Abnormal   Collection Time: 11/02/20  5:40 AM  Result Value Ref Range   Troponin I (High Sensitivity) 1,560 (HH) <18 ng/L    Comment: CRITICAL VALUE NOTED.  VALUE IS CONSISTENT WITH PREVIOUSLY REPORTED AND CALLED VALUE. (NOTE) Elevated high sensitivity troponin I (hsTnI) values and significant  changes across serial measurements may suggest ACS but many other  chronic and acute conditions are known to elevate hsTnI results.  Refer to the Links section for chest pain algorithms and additional  guidance. Performed at [facility], [place] [address], [place], [place] [address]   Lactic acid, plasma     Status: Abnormal   Collection Time: 11/02/20  6:29 AM  Result Value Ref Range   Lactic Acid, Venous 8.3 (HH) 0.5 - 1.9 mmol/L    Comment: CRITICAL VALUE NOTED.  VALUE IS CONSISTENT WITH PREVIOUSLY REPORTED AND CALLED VALUE. Performed at [facility], [place] [address], [place], [place] [address]   Glucose, capillary     Status: Abnormal   Collection Time: 11/02/20  7:55 AM  Result Value Ref Range   Glucose-Capillary 119 (H) 70 - 99 mg/dL    Comment: Glucose reference range applies only to samples taken after fasting for at least 8 hours.

## 2020-11-02 NOTE — Progress Notes (Addendum)
[facility]  Subjective  - Comfortable and eating breakfast.   Objective (!) 127/55 87 98.1 F (36.7 C) (Oral) (!) 8 100%  Intake/Output Summary (Last 24 hours) at 11/02/2020 2025 Last data filed at 11/02/2020 0600 Gross per 24 hour  Intake 1707.84 ml  Output --  Net 1707.84 ml    Minimal motor and sensation in the left LE Lungs non labored breathing BP A line 42'H systolic cuff 062'B systolic patient appears asymptomatic  Assessment/Planning: Left LE ischemia  Left LE arterial duplex SFA and popliteal stent occlusion Surgery cancelled yesterday due to hypotension with systolic reported in the 76'E Pending maximum medical improvement will re schedule per MD     Gary [name] 11/02/2020 7:02 AM   I agree with the above.  I have seen and examined the patient.  I spoke with his wife at [phone].  We discussed that he is no longer a candidate for revascularization due to the ischemic changes to his leg as well as his cardiac status.  He will require a left above knee amputation when he is more stable.  I will do this with regional anesthesia to avoid another general anesthetic.  Annamarie Major --  Laboratory Lab Results: Recent Labs    11/26/2020 1130 10/29/2020 1638 11/02/20 0007  WBC 11.5*  --  15.7*  HGB 8.5* 9.9* 7.8*  HCT 26.7* 29.0* 25.3*  PLT 181  --  173   BMET Recent Labs    11/18/2020 1700 11/02/20 0007  NA 134* 135  K 4.3 4.0  CL 96* 93*  CO2 13* 19*  GLUCOSE 54* 170*  BUN 21 25*  CREATININE 3.91* 4.31*  CALCIUM 8.0* 8.2*    COAG Lab Results  Component Value Date   INR 2.4 (H) 11/02/2020   INR 1.6 (H) 11/13/2020   INR 1.0 10/15/2008   No results found for: PTT

## 2020-11-02 NOTE — Progress Notes (Signed)
NAME:  Gary [name], MRN:  [id number], DOB:  1955-08-22, LOS: 1 ADMISSION DATE:  11/09/2020, CONSULTATION DATE:  7/4 REFERRING MD:  Zhang-TRH, CHIEF COMPLAINT:     History of Present Illness:  65 y/o male discharged on 7/2 after he was admitted after an extensive left lower extremity vascular intervention for advanced PVD.  This was complicated by arterial rupture and required a bovine patch. He was eventually discharged. He returned to the ER on 7/4 with an ischemic left leg. He was taken to the OR on 7/4 but he became profoundly hypotensive so he was brought to the ICU for further management prior to any invasive intervention.   Pertinent  Medical History  PAD, recent L femoral end arterectomy ESRD on HD HTN DM  Significant Hospital Events: Including procedures, antibiotic start and stop dates in addition to other pertinent events   6/30 R femoral bypass surgery 7/2 discharged home 7/4 readmitted after completing dialysis with acute limb ischemia. After intubation he was hypotensive and procedure was cancelled. 7/4 echo LVEF < 93% RV systolic function moderately reduced, RA severely dilated  Interim History / Subjective:  Feels OK today Left foot hurts Denies chest pain Remains on levophed, but much less than yesterday  Objective   Blood pressure (!) 124/112, pulse 88, temperature 98.2 F (36.8 C), temperature source Oral, resp. rate (!) 22, height 5\' 6"  (1.676 m), weight 59.5 kg, SpO2 100 %.        Intake/Output Summary (Last 24 hours) at 11/02/2020 0944 Last data filed at 11/02/2020 0900 Gross per 24 hour  Intake 1798.6 ml  Output --  Net 1798.6 ml   Filed Weights   11/13/2020 1430 11/02/20 0500  Weight: 59.6 kg 59.5 kg    Examination:  General:  Resting comfortably in bed HENT: NCAT OP clear PULM: CTA B, normal effort CV: RRR, no mgr GI: BS+, soft, nontender MSK: s/p R BKA, left great toe amputated, wound on 2nd toe with clean base, no surrounding redness or  erythema Neuro: awake, alert, no distress, MAEW   Resolved Hospital Problem list     Assessment & Plan:  Acute left lower extremity ischemia after recent left femoral revascularization Failed attempt at salvage revascularization on 7/4 due to hemodynamic instability Continue dual anti-platelet therapy with aspirin and plavix Will likely need amputation later in the week Start ceftazidime as he is high risk for infection of his ischemic left foot  Cardiogenic shock after NSTEMI/induction for anesthesia: lactic acid is improving but remains in shock, vasopressors requirements much improved on 7/5 Wean off levophed for MAP > 65 If remains on vasopressors tomorrow will place CVL to check coox, CVP, but at this point it appears he may be off vasopressor support by 7/6  Shock liver:  Repeat LFT in AM Supportive care Hold statin  COPD Continue anoro  HFrEF CAD with ischemic cardiomyopathy Chronic LBB NSTEMI Repeat LFT Continue heparin F/u echo Hold b-blocker  ESRD on HD Monitor BMET and UOP Replace electrolytes as needed F/u renal recommendations  DM2 with hyperglycemia SSI, monitor glucose  Acute blood loss anemia Monitor for bleeding Transfuse PRBC for Hgb < 7 gm/dL  Coagulopathy in setting of shock liver [place] for bleeding  Tobacco abuse:  Counsel to quit  Best Practice (right click and "Reselect all SmartList Selections" daily)   Diet/type: Regular consistency (see orders) DVT prophylaxis: systemic heparin GI prophylaxis: N/A Lines: N/A Foley:  N/A Code Status:  full code Last date of multidisciplinary goals of  care discussion [has not happened]  Labs   CBC: Recent Labs  Lab 10/29/20 0530 10/29/20 0848 11/21/2020 1130 11/10/2020 1638 11/02/20 0007  WBC 12.3* 13.3* 11.5*  --  15.7*  NEUTROABS 9.9*  --  8.9*  --   --   HGB 8.7* 9.1* 8.5* 9.9* 7.8*  HCT 28.2* 30.1* 26.7* 29.0* 25.3*  MCV 76.8* 78.4* 75.6*  --  77.8*  PLT 177 173 181  --  173     Basic Metabolic Panel: Recent Labs  Lab 10/28/20 0730 10/28/20 1551 10/29/20 0530 11/27/2020 1130 11/04/2020 1638 11/17/2020 1700 11/02/20 0007  NA 134*   < > 136 134* 134* 134* 135  K 5.5*   < > 4.3 3.6 4.1 4.3 4.0  CL 94*  --  95* 92*  --  96* 93*  CO2  --   --  26 26  --  13* 19*  GLUCOSE 173*  --  222* 135*  --  54* 170*  BUN 65*  --  51* 16  --  21 25*  CREATININE 7.30*  --  8.36* 3.28*  --  3.91* 4.31*  CALCIUM  --   --  8.5* 8.1*  --  8.0* 8.2*  MG  --   --   --   --   --   --  2.2  PHOS  --   --   --   --   --   --  7.2*   < > = values in this interval not displayed.   GFR: Estimated Creatinine Clearance: 14.6 mL/min (A) (by C-G formula based on SCr of 4.31 mg/dL (H)). Recent Labs  Lab 10/29/20 0530 10/29/20 0848 11/20/2020 1130 11/17/2020 2014 11/02/20 0007 11/02/20 0629  WBC 12.3* 13.3* 11.5*  --  15.7*  --   LATICACIDVEN  --   --   --  >11.0* 10.0* 8.3*    Liver Function Tests: Recent Labs  Lab 11/02/20 0007  AST 3,226*  ALT 1,193*  ALKPHOS 540*  BILITOT 3.4*  PROT 5.7*  ALBUMIN 2.8*   No results for input(s): LIPASE, AMYLASE in the last 168 hours. No results for input(s): AMMONIA in the last 168 hours.  ABG    Component Value Date/Time   PHART 7.410 11/17/2020 1638   PCO2ART 27.5 (L) 11/28/2020 1638   PO2ART 188 (H) 10/29/2020 1638   HCO3 17.6 (L) 11/14/2020 1638   TCO2 19 (L) 11/18/2020 1638   ACIDBASEDEF 6.0 (H) 11/16/2020 1638   O2SAT 100.0 10/31/2020 1638     Coagulation Profile: Recent Labs  Lab 11/18/2020 1130 11/02/20 0007  INR 1.6* 2.4*    Cardiac Enzymes: No results for input(s): CKTOTAL, CKMB, CKMBINDEX, TROPONINI in the last 168 hours.  HbA1C: Hgb A1c MFr Bld  Date/Time Value Ref Range Status  10/29/2020 08:48 AM 9.4 (H) 4.8 - 5.6 % Final    Comment:    (NOTE) Pre diabetes:          5.7%-6.4%  Diabetes:              >6.4%  Glycemic control for   <7.0% adults with diabetes   10/28/2020 08:30 PM 9.5 (H) 4.8 - 5.6 %  Final    Comment:    (NOTE)         Prediabetes: 5.7 - 6.4         Diabetes: >6.4         Glycemic control for adults with diabetes: <7.0     CBG: Recent  Labs  Lab 11/07/2020 2012 11/19/2020 2121 11/21/2020 2159 11/02/20 0315 11/02/20 0755  GLUCAP 23* 38* 171* 136* 119*    Critical care time: 31 minutes     Roselie Awkward, MD [facility] Pager: [phone] Cell: [phone] If no response, please call [phone] until 7pm After 7:00 pm call Elink  [phone]

## 2020-11-02 NOTE — Progress Notes (Addendum)
[facility] Progress Note Patient Name: Gary [name] DOB: 07-Sep-1955 MRN: [id number]   Date of Service  11/02/2020  HPI/Events of Note  Notified of increasing noreipnephrine requirement. Lactic acid 10 from >11  eICU Interventions  Will give at trial of 500 cc LR bolus No sign of volume overload at this time  Informed that hypotensive episode earlier was due to error in infusion Will cancel LR bolus as this has not been given     Intervention Category Intermediate Interventions: Hypotension - evaluation and management  Shona Needles Anzlee Hinesley 11/02/2020, 3:15 AM

## 2020-11-02 NOTE — Progress Notes (Signed)
[place] for heparin Indication:  limb ischemia  Allergies  Allergen Reactions   Oxycodone-Acetaminophen Rash and Other (See Comments)    Macular papular rash after two days on Oxy/APAP resolved with d/c of drug.    Patient Measurements: Height: 5\' 6"  (167.6 cm) Weight: 59.5 kg (131 lb 2.8 oz) IBW/kg (Calculated) : 63.8 Heparin Dosing Weight: 59.6 kg  Estimated Creatinine Clearance: 14.6 mL/min (A) (by C-G formula based on SCr of 4.31 mg/dL (H)).  Medical History:  Assessment: 65 yo M with cold extremity s/p R BKA. Hx of PVD, chronic ischemia of LLE. Recently patient underwent left common femoral, SFA and profundofemoral endarterectomy below-knee and stenting of SFA. L foot is cool to touch and distal 1/3. Faint DP pulse. He was taken to OR 7/4 but surgery aborted due to shock.  Heparin level of 0.16 is subtherapeutic on heparin 1300 units/hr (drawn ~45 minutes early). Per RN no issues with IV infusion or access.   Goal of Therapy:  Heparin level 0.3-0.7 units/ml Monitor platelets by anticoagulation protocol: Yes   Plan:  Increase heparin infusion to 1450 units/hr Check 8 hr heparin level  Monitor heparin level, CBC and s/s of bleeding daily    Cristela Felt, PharmD Clinical Pharmacist

## 2020-11-02 NOTE — Progress Notes (Addendum)
Progress Note  Patient Name: Gary [name] Date of Encounter: 11/02/2020  [facility] Cardiologist: Werner Lean, MD   Subjective   No chest pain or shortness of breath this morning.  Remains on Levophed 5 mcg/min.  Inpatient Medications    Scheduled Meds:  aspirin  81 mg Oral Daily   bisacodyl  5 mg Oral Daily   Chlorhexidine Gluconate Cloth  6 each Topical Daily   clopidogrel  75 mg Oral Daily   famotidine  20 mg Oral QHS   feeding supplement (NEPRO CARB STEADY)  240 mL Oral Daily   insulin aspart  1-3 Units Subcutaneous Q4H   mouth rinse  15 mL Mouth Rinse BID   multivitamin  1 tablet Oral QHS   polyethylene glycol  17 g Oral Daily   rosuvastatin  20 mg Oral q1800   senna-docusate  2 tablet Oral QHS   umeclidinium-vilanterol  1 puff Inhalation Daily   [START ON 11/07/2020] Vitamin D (Ergocalciferol)  50,000 Units Oral Q30 days   Continuous Infusions:  sodium chloride     heparin 1,100 Units/hr (11/02/20 0600)   norepinephrine (LEVOPHED) Adult infusion 5 mcg/min (11/02/20 0600)   phenylephrine (NEO-SYNEPHRINE) Adult infusion Stopped (11/04/2020 1859)   PRN Meds: acetaminophen, albuterol, docusate sodium, HYDROcodone-acetaminophen, metoCLOPramide, phenylephrine-shark liver oil-mineral oil-petrolatum, polyethylene glycol   Vital Signs    Vitals:   11/02/20 0500 11/02/20 0600 11/02/20 0800 11/02/20 0802  BP: 136/79 (!) 127/55  121/62  Pulse: 89 87  87  Resp: (!) 0 (!) 8  (!) 22  Temp:   98.2 F (36.8 C)   TempSrc:   Oral   SpO2: 97% 100%  96%  Weight: 59.5 kg     Height:        Intake/Output Summary (Last 24 hours) at 11/02/2020 0809 Last data filed at 11/02/2020 0600 Gross per 24 hour  Intake 1707.84 ml  Output --  Net 1707.84 ml   Last 3 Weights 11/02/2020 11/15/2020 10/29/2020  Weight (lbs) 131 lb 2.8 oz 131 lb 6.3 oz 131 lb 6.3 oz  Weight (kg) 59.5 kg 59.6 kg 59.6 kg      Telemetry    Sinus rhythm with occasional PVC - Personally  Reviewed   Physical Exam  Alert, oriented, no distress GEN: No acute distress.   Neck: No JVD Cardiac: Diffuse apical impulse, regular rate and rhythm with 2/6 systolic murmur at the apex Respiratory: Clear to auscultation bilaterally. GI: Soft, nontender, non-distended  MS: Right BKA, left foot cool Neuro:  Nonfocal  Psych: Normal affect   Labs    High Sensitivity Troponin:   Recent Labs  Lab 11/04/2020 1700 10/31/2020 2014 11/02/20 0007 11/02/20 0540  TROPONINIHS 590* 648* 793* 1,560*      Chemistry Recent Labs  Lab 11/09/2020 1130 11/08/2020 1638 10/29/2020 1700 11/02/20 0007  NA 134* 134* 134* 135  K 3.6 4.1 4.3 4.0  CL 92*  --  96* 93*  CO2 26  --  13* 19*  GLUCOSE 135*  --  54* 170*  BUN 16  --  21 25*  CREATININE 3.28*  --  3.91* 4.31*  CALCIUM 8.1*  --  8.0* 8.2*  PROT  --   --   --  5.7*  ALBUMIN  --   --   --  2.8*  AST  --   --   --  3,226*  ALT  --   --   --  1,193*  ALKPHOS  --   --   --  540*  BILITOT  --   --   --  3.4*  GFRNONAA 20*  --  16* 15*  ANIONGAP 16*  --  25* 23*     Hematology Recent Labs  Lab 10/29/20 0848 11/20/2020 1130 11/20/2020 1638 11/02/20 0007  WBC 13.3* 11.5*  --  15.7*  RBC 3.84* 3.53*  --  3.25*  HGB 9.1* 8.5* 9.9* 7.8*  HCT 30.1* 26.7* 29.0* 25.3*  MCV 78.4* 75.6*  --  77.8*  MCH 23.7* 24.1*  --  24.0*  MCHC 30.2 31.8  --  30.8  RDW 16.6* 17.7*  --  17.9*  PLT 173 181  --  173    BNPNo results for input(s): BNP, PROBNP in the last 168 hours.   DDimer No results for input(s): DDIMER in the last 168 hours.   Radiology    DG Chest 1 View  Result Date: 10/31/2020 CLINICAL DATA:  Hypotension.  Hypoxia. EXAM: CHEST  1 VIEW COMPARISON:  10/29/2020 FINDINGS: Cardiomegaly remains stable. Aortic atherosclerotic calcification noted. Both lungs are clear. No evidence of pneumothorax. Linear lucencies are seen in the superior mediastinum. This may represent air within the esophagus, however pneumomediastinum cannot be excluded.  IMPRESSION: Stable cardiomegaly. No active lung disease. Linear lucencies in superior mediastinum may represent air within the esophagus, however pneumomediastinum cannot be excluded. Electronically Signed   By: Marlaine Hind M.D.   On: 11/17/2020 17:27   ECHOCARDIOGRAM COMPLETE  Result Date: 11/02/2020    ECHOCARDIOGRAM REPORT   Patient Name:   Gary [name] [facility] Date of Exam: 11/24/2020 Medical Rec #:  [id number]      Height:       66.0 in Accession #:    [id number]     Weight:       131.4 lb Date of Birth:  09-22-1955     BSA:          1.673 m Patient Age:    65 years       BP:           122/81 mmHg Patient Gender: M              HR:           90 bpm. Exam Location:  Inpatient Procedure: 2D Echo, Cardiac Doppler and Color Doppler Indications:    Shock  History:        Patient has no prior history of Echocardiogram examinations.                 Arrythmias:LBBB; Risk Factors:Hypertension, Diabetes, Current                 Smoker and Dyslipidemia. HFrEF. PAD. ESRD.  Sonographer:    Clayton Lefort RDCS (AE) Referring Phys: [id number] [place]  1. Left ventricular ejection fraction, by estimation, is <20%. Left ventricular ejection fraction by 3D volume is 18 %. The left ventricle has severely decreased function. The left ventricle demonstrates regional wall motion abnormalities (consistent with multi-vessel disease). Left ventricular diastolic parameters are indeterminate. There is the interventricular septum is flattened in diastole ('D' shaped left ventricle), consistent with right ventricular volume overload. There is no evidence of LV thrombus.  2. Right ventricular systolic function is moderately reduced. The right ventricular size is moderately enlarged. There is normal pulmonary artery systolic pressure. The estimated right ventricular systolic pressure is 62.8 mmHg.  3. Right atrial size was severely dilated.  4. The mitral valve is grossly normal. Moderate mitral valve regurgitation. No  evidence of  mitral stenosis.  5. 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  6. The aortic valve is tricuspid. There is moderate calcification of the aortic valve. There is severe thickening of the aortic valve. Aortic valve regurgitation is not visualized.  7. The inferior vena cava is normal in size with <50% respiratory variability, suggesting right atrial pressure of 8 mmHg. Comparison(s): No prior Echocardiogram. In comparison to prior study (chart review only from OSH) this represents a worsening in LVEF and valvular regurgitation. Conclusion(s)/Recommendation(s): Echo findings have been discussed with critical care and cardiology teams. FINDINGS  Left Ventricle: Left ventricular ejection fraction, by estimation, is <20%. Left ventricular ejection fraction by 3D volume is 18 %. The left ventricle has severely decreased function. The left ventricle demonstrates regional wall motion abnormalities. The left ventricular internal cavity size was normal in size. There is no left ventricular hypertrophy. The interventricular septum is flattened in diastole ('D' shaped left ventricle), consistent with right ventricular volume overload. Left ventricular diastolic parameters are indeterminate.  LV Wall Scoring: The mid and distal anterior wall, entire lateral wall, entire apex, and entire inferior wall are akinetic. The anterior septum, mid inferoseptal segment, basal anterior segment, and basal inferoseptal segment are hypokinetic. Right Ventricle: The right ventricular size is moderately enlarged. No increase in right ventricular wall thickness. Right ventricular systolic function is moderately reduced. There is normal pulmonary artery systolic pressure. The tricuspid regurgitant velocity is 2.36 m/s, and with an assumed right atrial pressure of 8 mmHg, the estimated right ventricular systolic pressure is 17.4 mmHg. Left Atrium: Left atrial size was normal in size. Right Atrium: Right atrial  size was severely dilated. Pericardium: There is no evidence of pericardial effusion. Mitral Valve: The mitral valve is grossly normal. Moderate mitral valve regurgitation. No evidence of mitral valve stenosis. Tricuspid Valve: The tricuspid valve is normal in structure. Tricuspid valve regurgitation is severe. No evidence of tricuspid stenosis. Aortic Valve: The aortic valve is tricuspid. There is moderate calcification of the aortic valve. There is severe thickening of the aortic valve. Aortic valve regurgitation is not visualized. Aortic valve mean gradient measures 3.0 mmHg. Aortic valve peak gradient measures 6.5 mmHg. Aortic valve area, by VTI measures 1.47 cm. Pulmonic Valve: The pulmonic valve was not well visualized. Pulmonic valve regurgitation is not visualized. No evidence of pulmonic stenosis. Aorta: The aortic root and ascending aorta are structurally normal, with no evidence of dilitation. Venous: The inferior vena cava is normal in size with less than 50% respiratory variability, suggesting right atrial pressure of 8 mmHg. IAS/Shunts: The atrial septum is grossly normal.  LEFT VENTRICLE PLAX 2D LVIDd:         5.30 cm LVIDs:         4.90 cm LV PW:         1.10 cm         3D Volume EF LV IVS:        1.00 cm         LV 3D EF:    Left LVOT diam:     1.80 cm                      ventricular LV SV:         24                           ejection LV SV Index:   14  fraction by LVOT Area:     2.54 cm                     3D volume                                             is 18 %.  LV Volumes (MOD) LV vol d, MOD    174.0 ml      3D Volume EF: A4C:                           3D EF:        18 % LV SV MOD A4C:   174.0 ml      LV EDV:       263 ml                                LV ESV:       216 ml                                LV SV:        47 ml RIGHT VENTRICLE          IVC RV Basal diam:  5.30 cm  IVC diam: 1.90 cm RV Mid diam:    4.00 cm LEFT ATRIUM             Index       RIGHT  ATRIUM           Index LA diam:        3.30 cm 1.97 cm/m  RA Area:     26.40 cm LA Vol (A2C):   72.9 ml 43.58 ml/m RA Volume:   107.00 ml 63.96 ml/m LA Vol (A4C):   39.1 ml 23.37 ml/m LA Biplane Vol: 52.6 ml 31.44 ml/m  AORTIC VALVE AV Area (Vmax):    1.60 cm AV Area (Vmean):   1.57 cm AV Area (VTI):     1.47 cm AV Vmax:           127.00 cm/s AV Vmean:          81.500 cm/s AV VTI:            0.165 m AV Peak Grad:      6.5 mmHg AV Mean Grad:      3.0 mmHg LVOT Vmax:         79.80 cm/s LVOT Vmean:        50.400 cm/s LVOT VTI:          0.095 m LVOT/AV VTI ratio: 0.58  AORTA Ao Root diam: 2.90 cm Ao Asc diam:  2.40 cm MR Peak grad:    76.4 mmHg   TRICUSPID VALVE MR Mean grad:    39.0 mmHg   TR Peak grad:   22.3 mmHg MR Vmax:         437.00 cm/s TR Vmax:        236.00 cm/s MR Vmean:        286.0 cm/s MR PISA:         1.57 cm    SHUNTS MR PISA Eff ROA: 14 mm      Systemic VTI:  0.10 m MR PISA Radius:  0.50 cm     Systemic Diam: 1.80 cm Rudean Haskell MD Electronically signed by Rudean Haskell MD Signature Date/Time: 11/09/2020/8:19:29 PM    Final    VAS [place] LOWER EXTREMITY ARTERIAL DUPLEX  Result Date: 11/10/2020 LOWER EXTREMITY ARTERIAL DUPLEX STUDY Patient Name:  Gary [name] [facility]  Date of Exam:   11/15/2020 Medical Rec #: [id number]       Accession #:    [id number] Date of Birth: 07/19/1955      Patient Gender: M Patient Age:   9Y Exam Location:  [facility] Procedure:      VAS [place] LOWER EXTREMITY ARTERIAL DUPLEX Referring Phys: [id number] ELIZABETH REES --------------------------------------------------------------------------------  Indications: Peripheral artery disease Worsening left lower extremity pain and              cold left foot. High Risk Factors: Hypertension, hyperlipidemia, Diabetes. 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 ESRD.  Vascular Interventions: Left common femoral, SFA and profundofemoral                         endarterectomy below-knee and stenting of  SFA 10/28/20.                         Right BKA. Current ABI:            N/A Comparison Study: No prior study on file Performing Technologist: Sharion Dove RVS  Examination Guidelines: A complete evaluation includes B-mode imaging, spectral Doppler, color Doppler, and power Doppler as needed of all accessible portions of each vessel. Bilateral testing is considered an integral part of a complete examination. Limited examinations for reoccurring indications may be performed as noted.   +--------+--------+-----+--------+--------+--------+ LEFT    PSV cm/sRatioStenosisWaveformComments +--------+--------+-----+--------+--------+--------+ CFA [id number]                                    +--------+--------+-----+--------+--------+--------+  Summary: Left: SFA and popliteal stents appear occluded. No Dopplerable flow in the posterior tibial, peroneal, or anterior tibial arteries.  See table(s) above for measurements and observations. Electronically signed by Harold Barban MD on 11/04/2020 at 2:28:03 PM.    Final     Cardiac Studies   2D Echo: IMPRESSIONS     1. Left ventricular ejection fraction, by estimation, is <20%. Left  ventricular ejection fraction by 3D volume is 18 %. The left ventricle has  severely decreased function. The left ventricle demonstrates regional wall  motion abnormalities (consistent  with multi-vessel disease). Left ventricular diastolic parameters are  indeterminate. There is the interventricular septum is flattened in  diastole ('D' shaped left ventricle), consistent with right ventricular  volume overload. There is no evidence of LV  thrombus.   2. Right ventricular systolic function is moderately reduced. The right  ventricular size is moderately enlarged. There is normal pulmonary artery  systolic pressure. The estimated right ventricular systolic pressure is  96.2 mmHg.   3. Right atrial size was severely dilated.   4. The mitral valve is grossly normal.  Moderate mitral valve  regurgitation. No evidence of mitral stenosis.   5. Tricuspid valve regurgitation is secondary (ventricular functional)  and severe.   6. The aortic valve is tricuspid. There is moderate calcification of the  aortic valve. There is severe thickening of the aortic valve. Aortic valve  regurgitation is not visualized.  7. The inferior vena cava is normal in size with <50% respiratory  variability, suggesting right atrial pressure of 8 mmHg.   Comparison(s): No prior Echocardiogram. In comparison to prior study  (chart review only from OSH) this represents a worsening in LVEF and  valvular regurgitation.   Conclusion(s)/Recommendation(s): Echo findings have been discussed with  critical care and cardiology teams.   Patient Profile     65 y.o. male with multiple comorbid conditions including end-stage renal disease, severe cardiomyopathy with chronic systolic heart failure, COPD, and peripheral arterial disease with history of right BKA, presenting with an ischemic left foot, developing profound hypotension with induction of anesthesia for vascular surgery.  Cardiology consultation requested to help with management  Assessment & Plan    1.  Elevated troponin non-STEMI versus demand ischemia: High-sensitivity troponin up to 1560 in the setting of shock and end-stage renal disease.  High likelihood of multivessel coronary artery disease, but no previous heart catheterization.  Medical therapy indicated in this patient with multiple acute and chronic comorbid conditions.  Patient with very severe LV dysfunction LVEF less than 20%.  Continue IV heparin another 24 hours.  Medical therapy extremely limited in the setting of shock and end-stage renal disease.  Continue aspirin and clopidogrel. 2.  End-stage renal disease: Management per nephrology 3.  Ischemic left leg: Limited treatment options in this patient at high risk for complication with vascular surgery.  He was  hemodynamically unstable with shock developing during anesthesia for surgery.  Because of his severe underlying cardiac disease and multiple comorbidities, I do not think he will be able to tolerate limb salvage revascularization surgery.  Discussed this with the patient this morning.  He understands and requests that I call his wife - tried to call and number in Epic is not in service.  4.  Shock, suspect mixed etiology: Patient requiring 5 mcg/min norepinephrine infusion to support his blood pressure. Wean as tolerated.     For questions or updates, please contact [place] Please consult [email or web address] for contact info under        Signed, Sherren Mocha, MD  11/02/2020, 8:09 AM

## 2020-11-03 ENCOUNTER — Inpatient Hospital Stay (HOSPITAL_COMMUNITY): Payer: Medicare (Managed Care)

## 2020-11-03 DIAGNOSIS — J9601 Acute respiratory failure with hypoxia: Secondary | ICD-10-CM

## 2020-11-03 DIAGNOSIS — I469 Cardiac arrest, cause unspecified: Secondary | ICD-10-CM

## 2020-11-03 LAB — CBC
HCT: 27.8 % — ABNORMAL LOW (ref 39.0–52.0)
Hemoglobin: 8.6 g/dL — ABNORMAL LOW (ref 13.0–17.0)
MCH: 24 pg — ABNORMAL LOW (ref 26.0–34.0)
MCHC: 30.9 g/dL (ref 30.0–36.0)
MCV: 77.4 fL — ABNORMAL LOW (ref 80.0–100.0)
Platelets: 174 10*3/uL (ref 150–400)
RBC: 3.59 MIL/uL — ABNORMAL LOW (ref 4.22–5.81)
RDW: 18.5 % — ABNORMAL HIGH (ref 11.5–15.5)
WBC: 14.7 10*3/uL — ABNORMAL HIGH (ref 4.0–10.5)
nRBC: 0.5 % — ABNORMAL HIGH (ref 0.0–0.2)

## 2020-11-03 LAB — POCT I-STAT 7, (LYTES, BLD GAS, ICA,H+H)
Acid-Base Excess: 4 mmol/L — ABNORMAL HIGH (ref 0.0–2.0)
Acid-base deficit: 2 mmol/L (ref 0.0–2.0)
Bicarbonate: 23.3 mmol/L (ref 20.0–28.0)
Bicarbonate: 26 mmol/L (ref 20.0–28.0)
Calcium, Ion: 0.88 mmol/L — CL (ref 1.15–1.40)
Calcium, Ion: 0.86 mmol/L — CL (ref 1.15–1.40)
HCT: 30 % — ABNORMAL LOW (ref 39.0–52.0)
HCT: 29 % — ABNORMAL LOW (ref 39.0–52.0)
Hemoglobin: 10.2 g/dL — ABNORMAL LOW (ref 13.0–17.0)
Hemoglobin: 9.9 g/dL — ABNORMAL LOW (ref 13.0–17.0)
O2 Saturation: 94 %
O2 Saturation: 100 %
Patient temperature: 97.8
Potassium: 4.3 mmol/L (ref 3.5–5.1)
Potassium: 3.7 mmol/L (ref 3.5–5.1)
Sodium: 127 mmol/L — ABNORMAL LOW (ref 135–145)
Sodium: 130 mmol/L — ABNORMAL LOW (ref 135–145)
TCO2: 25 mmol/L (ref 22–32)
TCO2: 27 mmol/L (ref 22–32)
pCO2 arterial: 41.9 mmHg (ref 32.0–48.0)
pCO2 arterial: 30.6 mmHg — ABNORMAL LOW (ref 32.0–48.0)
pH, Arterial: 7.351 (ref 7.350–7.450)
pH, Arterial: 7.537 — ABNORMAL HIGH (ref 7.350–7.450)
pO2, Arterial: 71 mmHg — ABNORMAL LOW (ref 83.0–108.0)
pO2, Arterial: 422 mmHg — ABNORMAL HIGH (ref 83.0–108.0)

## 2020-11-03 LAB — HEPARIN LEVEL (UNFRACTIONATED)
Heparin Unfractionated: 0.24 IU/mL — ABNORMAL LOW (ref 0.30–0.70)
Heparin Unfractionated: 0.29 IU/mL — ABNORMAL LOW (ref 0.30–0.70)
Heparin Unfractionated: 0.5 IU/mL (ref 0.30–0.70)

## 2020-11-03 LAB — COOXEMETRY PANEL
Carboxyhemoglobin: 0.8 % (ref 0.5–1.5)
Methemoglobin: 1.3 % (ref 0.0–1.5)
O2 Saturation: 34.8 %
Total hemoglobin: 9.5 g/dL — ABNORMAL LOW (ref 12.0–16.0)

## 2020-11-03 LAB — DIC (DISSEMINATED INTRAVASCULAR COAGULATION)PANEL
D-Dimer, Quant: 8.97 ug/mL-FEU — ABNORMAL HIGH (ref 0.00–0.50)
Fibrinogen: 373 mg/dL (ref 210–475)
INR: 3.3 — ABNORMAL HIGH (ref 0.8–1.2)
Platelets: 178 10*3/uL (ref 150–400)
Prothrombin Time: 33.8 seconds — ABNORMAL HIGH (ref 11.4–15.2)
Smear Review: NONE SEEN
aPTT: >200 seconds (ref 24–36)

## 2020-11-03 LAB — BASIC METABOLIC PANEL
Anion gap: 16 — ABNORMAL HIGH (ref 5–15)
BUN: 36 mg/dL — ABNORMAL HIGH (ref 8–23)
CO2: 23 mmol/L (ref 22–32)
Calcium: 7.4 mg/dL — ABNORMAL LOW (ref 8.9–10.3)
Chloride: 91 mmol/L — ABNORMAL LOW (ref 98–111)
Creatinine, Ser: 5.43 mg/dL — ABNORMAL HIGH (ref 0.61–1.24)
GFR, Estimated: 11 mL/min — ABNORMAL LOW (ref >60)
Glucose, Bld: 126 mg/dL — ABNORMAL HIGH (ref 70–99)
Potassium: 4.1 mmol/L (ref 3.5–5.1)
Sodium: 130 mmol/L — ABNORMAL LOW (ref 135–145)

## 2020-11-03 LAB — HEPATIC FUNCTION PANEL
ALT: 1665 U/L — ABNORMAL HIGH (ref 0–44)
AST: 4722 U/L — ABNORMAL HIGH (ref 15–41)
Albumin: 2.5 g/dL — ABNORMAL LOW (ref 3.5–5.0)
Alkaline Phosphatase: 525 U/L — ABNORMAL HIGH (ref 38–126)
Bilirubin, Direct: 1.9 mg/dL — ABNORMAL HIGH (ref 0.0–0.2)
Indirect Bilirubin: 1.4 mg/dL — ABNORMAL HIGH (ref 0.3–0.9)
Total Bilirubin: 3.3 mg/dL — ABNORMAL HIGH (ref 0.3–1.2)
Total Protein: 5.5 g/dL — ABNORMAL LOW (ref 6.5–8.1)

## 2020-11-03 LAB — GLUCOSE, CAPILLARY
Glucose-Capillary: 128 mg/dL — ABNORMAL HIGH (ref 70–99)
Glucose-Capillary: 92 mg/dL (ref 70–99)
Glucose-Capillary: 137 mg/dL — ABNORMAL HIGH (ref 70–99)
Glucose-Capillary: 136 mg/dL — ABNORMAL HIGH (ref 70–99)

## 2020-11-03 LAB — PROTIME-INR
INR: 2.9 — ABNORMAL HIGH (ref 0.8–1.2)
Prothrombin Time: 30.4 seconds — ABNORMAL HIGH (ref 11.4–15.2)

## 2020-11-03 LAB — PHOSPHORUS: Phosphorus: 6.3 mg/dL — ABNORMAL HIGH (ref 2.5–4.6)

## 2020-11-03 LAB — TROPONIN I (HIGH SENSITIVITY): Troponin I (High Sensitivity): 5922 ng/L (ref <18)

## 2020-11-03 LAB — LACTIC ACID, PLASMA: Lactic Acid, Venous: 4.1 mmol/L (ref 0.5–1.9)

## 2020-11-03 MED ORDER — LORAZEPAM 2 MG/ML IJ SOLN: 1.0000 mg | INTRAMUSCULAR | Status: DC | PRN

## 2020-11-03 MED ORDER — HYDROMORPHONE HCL 1 MG/ML IJ SOLN: 0.5000 mg | INTRAMUSCULAR | Status: DC | PRN

## 2020-11-03 MED ORDER — AMIODARONE HCL IN DEXTROSE 360-4.14 MG/200ML-% IV SOLN
INTRAVENOUS | Status: AC
  Filled 2020-11-03: qty 200

## 2020-11-03 MED ORDER — SODIUM CHLORIDE 0.9 % IV SOLN
2.0000 g | Freq: Two times a day (BID) | INTRAVENOUS | Status: DC
  Administered 2020-11-03: 2 g via INTRAVENOUS
  Filled 2020-11-03 (×2): qty 2

## 2020-11-03 MED ORDER — AMIODARONE HCL IN DEXTROSE 360-4.14 MG/200ML-% IV SOLN
INTRAVENOUS | Status: AC
  Administered 2020-11-03: 60 mg/h
  Filled 2020-11-03: qty 200

## 2020-11-03 MED ORDER — RENA-VITE PO TABS: 1.0000 | ORAL_TABLET | Freq: Every day | ORAL | Status: DC

## 2020-11-03 MED ORDER — FENTANYL CITRATE (PF) 100 MCG/2ML IJ SOLN
INTRAMUSCULAR | Status: AC
  Filled 2020-11-03: qty 2

## 2020-11-03 MED ORDER — FENTANYL CITRATE (PF) 100 MCG/2ML IJ SOLN: 50.0000 ug | INTRAMUSCULAR | Status: DC | PRN

## 2020-11-03 MED ORDER — POLYETHYLENE GLYCOL 3350 17 G PO PACK: 17.0000 g | PACK | Freq: Every day | ORAL | Status: DC

## 2020-11-03 MED ORDER — SENNOSIDES-DOCUSATE SODIUM 8.6-50 MG PO TABS: 2.0000 | ORAL_TABLET | Freq: Every day | ORAL | Status: DC

## 2020-11-03 MED ORDER — NOREPINEPHRINE 16 MG/250ML-% IV SOLN
0.0000 ug/min | INTRAVENOUS | Status: DC
  Administered 2020-11-03: 25 ug/min via INTRAVENOUS
  Filled 2020-11-03: qty 250

## 2020-11-03 MED ORDER — PHYTONADIONE 5 MG PO TABS
10.0000 mg | ORAL_TABLET | Freq: Once | ORAL | Status: AC
  Administered 2020-11-03: 10 mg via ORAL
  Filled 2020-11-03: qty 2

## 2020-11-03 MED ORDER — HEPARIN SODIUM (PORCINE) 1000 UNIT/ML DIALYSIS
1000.0000 [IU] | INTRAMUSCULAR | Status: DC | PRN
  Administered 2020-11-03: 2400 [IU] via INTRAVENOUS_CENTRAL
  Filled 2020-11-03: qty 4
  Filled 2020-11-03 (×2): qty 6

## 2020-11-03 MED ORDER — ETOMIDATE 2 MG/ML IV SOLN
20.0000 mg | Freq: Once | INTRAVENOUS | Status: AC
  Administered 2020-11-03: 20 mg via INTRAVENOUS

## 2020-11-03 MED ORDER — SODIUM CHLORIDE 0.9 % IV SOLN: 0.5000 mg/h | INTRAVENOUS | Status: DC

## 2020-11-03 MED ORDER — ROCURONIUM BROMIDE 10 MG/ML (PF) SYRINGE
PREFILLED_SYRINGE | INTRAVENOUS | Status: AC
  Filled 2020-11-03: qty 10

## 2020-11-03 MED ORDER — VASOPRESSIN 20 UNITS/100 ML INFUSION FOR SHOCK: 0.0000 [IU]/min | INTRAVENOUS | Status: DC

## 2020-11-03 MED ORDER — HEPARIN SODIUM (PORCINE) 1000 UNIT/ML DIALYSIS
1000.0000 [IU] | INTRAMUSCULAR | Status: DC | PRN
  Administered 2020-11-03: 2400 [IU] via INTRAVENOUS_CENTRAL
  Filled 2020-11-03 (×2): qty 6
  Filled 2020-11-03: qty 3

## 2020-11-03 MED ORDER — DOBUTAMINE IN D5W 4-5 MG/ML-% IV SOLN
2.5000 ug/kg/min | INTRAVENOUS | Status: DC
  Administered 2020-11-03: 2.5 ug/kg/min via INTRAVENOUS

## 2020-11-03 MED ORDER — ETOMIDATE 2 MG/ML IV SOLN
INTRAVENOUS | Status: AC
  Filled 2020-11-03: qty 20

## 2020-11-03 MED ORDER — BUDESONIDE 0.5 MG/2ML IN SUSP: 0.5000 mg | Freq: Two times a day (BID) | RESPIRATORY_TRACT | Status: DC

## 2020-11-03 MED ORDER — DIPHENHYDRAMINE HCL 50 MG/ML IJ SOLN: 25.0000 mg | INTRAMUSCULAR | Status: DC | PRN

## 2020-11-03 MED ORDER — PANTOPRAZOLE SODIUM 40 MG IV SOLR
40.0000 mg | Freq: Every day | INTRAVENOUS | Status: DC
  Administered 2020-11-03: 40 mg via INTRAVENOUS
  Filled 2020-11-03: qty 40

## 2020-11-03 MED ORDER — SODIUM CHLORIDE 0.9 % IV SOLN
0.5000 mg/h | INTRAVENOUS | Status: DC
  Administered 2020-11-03: 0.5 mg/h via INTRAVENOUS
  Filled 2020-11-03: qty 2.5

## 2020-11-03 MED ORDER — MIDAZOLAM HCL 2 MG/2ML IJ SOLN
INTRAMUSCULAR | Status: AC
  Filled 2020-11-03: qty 2

## 2020-11-03 MED ORDER — PRISMASOL BGK 4/2.5 32-4-2.5 MEQ/L REPLACEMENT SOLN: Status: DC

## 2020-11-03 MED ORDER — CALCIUM GLUCONATE-NACL 1-0.675 GM/50ML-% IV SOLN
1.0000 g | Freq: Once | INTRAVENOUS | Status: AC
  Administered 2020-11-03: 1000 mg via INTRAVENOUS
  Filled 2020-11-03: qty 50

## 2020-11-03 MED ORDER — CHLORHEXIDINE GLUCONATE CLOTH 2 % EX PADS: 6.0000 | MEDICATED_PAD | Freq: Every day | CUTANEOUS | Status: DC

## 2020-11-03 MED ORDER — FENTANYL CITRATE (PF) 100 MCG/2ML IJ SOLN
50.0000 ug | Freq: Once | INTRAMUSCULAR | Status: AC
  Administered 2020-11-03: 50 ug via INTRAVENOUS

## 2020-11-03 MED ORDER — SODIUM CHLORIDE 0.9 % FOR CRRT: 500.0000 mL | INTRAVENOUS_CENTRAL | Status: DC | PRN

## 2020-11-03 MED ORDER — HYDROMORPHONE BOLUS VIA INFUSION
0.5000 mg | INTRAVENOUS | Status: DC | PRN
Start: 2020-11-03 — End: 2020-11-03
  Filled 2020-11-03: qty 2

## 2020-11-03 MED ORDER — ROCURONIUM BROMIDE 50 MG/5ML IV SOLN
70.0000 mg | Freq: Once | INTRAVENOUS | Status: AC
  Administered 2020-11-03: 70 mg via INTRAVENOUS
  Filled 2020-11-03: qty 7

## 2020-11-03 MED ORDER — POLYVINYL ALCOHOL 1.4 % OP SOLN
1.0000 [drp] | Freq: Four times a day (QID) | OPHTHALMIC | Status: DC | PRN
  Filled 2020-11-03: qty 15

## 2020-11-03 MED ORDER — DOCUSATE SODIUM 50 MG/5ML PO LIQD: 100.0000 mg | Freq: Two times a day (BID) | ORAL | Status: DC

## 2020-11-03 MED ORDER — ARFORMOTEROL TARTRATE 15 MCG/2ML IN NEBU: 15.0000 ug | INHALATION_SOLUTION | Freq: Two times a day (BID) | RESPIRATORY_TRACT | Status: DC

## 2020-11-03 MED ORDER — HYDROMORPHONE BOLUS VIA INFUSION
0.5000 mg | INTRAVENOUS | Status: DC | PRN
  Filled 2020-11-03: qty 2

## 2020-11-03 MED ORDER — PRISMASOL BGK 4/2.5 32-4-2.5 MEQ/L EC SOLN: Status: DC

## 2020-11-03 MED ORDER — FENTANYL CITRATE (PF) 100 MCG/2ML IJ SOLN
50.0000 ug | INTRAMUSCULAR | Status: DC | PRN
Start: 2020-11-03 — End: 2020-11-03

## 2020-11-03 MED ORDER — FENTANYL CITRATE (PF) 100 MCG/2ML IJ SOLN
50.0000 ug | INTRAMUSCULAR | Status: DC | PRN
  Administered 2020-11-03: 50 ug via INTRAVENOUS

## 2020-11-03 MED ORDER — GLYCOPYRROLATE 0.2 MG/ML IJ SOLN: 0.4000 mg | INTRAMUSCULAR | Status: DC

## 2020-11-04 LAB — GLUCOSE, CAPILLARY
Glucose-Capillary: 102 mg/dL — ABNORMAL HIGH (ref 70–99)
Glucose-Capillary: 13 mg/dL — CL (ref 70–99)
Glucose-Capillary: 21 mg/dL — CL (ref 70–99)

## 2020-11-05 MED FILL — Medication: Qty: 1 | Status: AC

## 2020-11-07 LAB — CULTURE, BLOOD (ROUTINE X 2)
Culture: NO GROWTH
Culture: NO GROWTH
Special Requests: ADEQUATE

## 2020-11-08 MED FILL — Heparin Sodium (Porcine) Inj 1000 Unit/ML: Qty: 500 | Status: AC

## 2020-11-10 ENCOUNTER — Other Ambulatory Visit (HOSPITAL_COMMUNITY): Payer: Medicare (Managed Care)

## 2020-11-10 ENCOUNTER — Ambulatory Visit (HOSPITAL_COMMUNITY): Payer: Medicare (Managed Care)

## 2020-11-16 ENCOUNTER — Ambulatory Visit: Payer: Medicare (Managed Care) | Admitting: Podiatry

## 2020-11-19 ENCOUNTER — Encounter (HOSPITAL_COMMUNITY): Payer: Medicare (Managed Care)

## 2020-11-23 ENCOUNTER — Ambulatory Visit: Payer: Medicare (Managed Care) | Admitting: Cardiology

## 2020-11-29 NOTE — Progress Notes (Signed)
30 ml wasted from Dilaudid infusion (25mg /66ml infusion.)  Total wasted = 55ml/15 mg  Wasted in steri cycle.  Loralie Champagne, RN witness.

## 2020-11-29 NOTE — Progress Notes (Addendum)
Palliative:  Full note to follow. I met today at [facility] bedside along with wife, Gary [name]. Gary [name] and I discussed Gary [name]'s weak heart and multiple other health conditions that have overwhelmed his body leading to his decline. She understands that this is not reversible. Plan to stop CRRT with plans for family to come and visit with him to say goodbyes. Once family have had opportunity to visit we will proceed with one way extubation to full comfort care. Discussed plan with bedside RN as well as Dr. [place] Bells. Orders changed to keep him comfortable and liberalize comfort medications prior to extubation. Anticipate one way extubation to comfort this evening after time with family.   Update: Explained situation to arriving family members. Alerted that he has a sister en-route from [place], [place] planning to arrive by tomorrow. They want to wait for her to arrive before proceeding with extubation. Goal is still for comfort. They do understand that there is the potential that Gary [name] could die before extubated. RN updated.   Update: Notified by RN at [facility] that patient and wife want to proceed with extubation. Will place order and proceed once RN able to get him more comfortable.   Vinie Sill, NP Palliative Medicine Team Pager [phone] (Please see [email or web address] for schedule) Team Phone [phone]

## 2020-11-29 NOTE — Progress Notes (Signed)
Progress Note  Patient Name: Gary [name] Date of Encounter: Nov 07, 2020  [facility] Cardiologist: Werner Lean, MD   Subjective   No chest pain or dyspnea  Inpatient Medications    Scheduled Meds:  aspirin  81 mg Oral Daily   bisacodyl  5 mg Oral Daily   Chlorhexidine Gluconate Cloth  6 each Topical Daily   clopidogrel  75 mg Oral Daily   famotidine  20 mg Oral QHS   feeding supplement (NEPRO CARB STEADY)  240 mL Oral Daily   insulin aspart  1-3 Units Subcutaneous Q4H   mouth rinse  15 mL Mouth Rinse BID   multivitamin  1 tablet Oral QHS   polyethylene glycol  17 g Oral Daily   senna-docusate  2 tablet Oral QHS   umeclidinium-vilanterol  1 puff Inhalation Daily   [START ON 11/07/2020] Vitamin D (Ergocalciferol)  50,000 Units Oral Q30 days   Continuous Infusions:  sodium chloride     cefTAZidime (FORTAZ)  IV     heparin 1,600 Units/hr (Nov 07, 2020 5956)   norepinephrine (LEVOPHED) Adult infusion 9 mcg/min (11/07/20 0500)   phenylephrine (NEO-SYNEPHRINE) Adult infusion Stopped (11/02/2020 1859)   PRN Meds: acetaminophen, albuterol, docusate sodium, HYDROcodone-acetaminophen, metoCLOPramide, phenylephrine-shark liver oil-mineral oil-petrolatum, polyethylene glycol   Vital Signs    Vitals:   11/07/20 0615 11/07/2020 0630 Nov 07, 2020 0645 11/07/2020 0705  BP:      Pulse: 93 95 94 96  Resp: 16 16 10 15   Temp:    97.8 F (36.6 C)  TempSrc:    Oral  SpO2: 93% 92% 93% (!) 76%  Weight:      Height:        Intake/Output Summary (Last 24 hours) at 11/07/20 0748 Last data filed at Nov 07, 2020 0500 Gross per 24 hour  Intake 958.34 ml  Output --  Net 958.34 ml   Last 3 Weights 11/07/2020 11/02/2020 11/23/2020  Weight (lbs) 134 lb 7.7 oz 131 lb 2.8 oz 131 lb 6.3 oz  Weight (kg) 61 kg 59.5 kg 59.6 kg      Telemetry    Sinus- Personally Reviewed   Physical Exam   General: Well developed, well nourished, NAD  HEENT: OP clear, mucus membranes moist  Neuro: No focal  deficits  Psychiatric: Mood and affect normal  Neck: No JVD Lungs:Clear bilaterally, no wheezes, rhonci, crackles Cardiovascular: Regular rate and rhythm. Systolic murmur. Abdomen:Soft. Bowel sounds present. Non-tender.  Extremities: Right BKA. Left foot and leg cool to touch  Labs    High Sensitivity Troponin:   Recent Labs  Lab 11/16/2020 1700 11/11/2020 2014 11/02/20 0007 11/02/20 0540  TROPONINIHS 590* 648* 793* 1,560*      Chemistry Recent Labs  Lab 11/22/2020 1700 11/02/20 0007 2020-11-07 0317  NA 134* 135 130*  K 4.3 4.0 4.1  CL 96* 93* 91*  CO2 13* 19* 23  GLUCOSE 54* 170* 126*  BUN 21 25* 36*  CREATININE 3.91* 4.31* 5.43*  CALCIUM 8.0* 8.2* 7.4*  PROT  --  5.7*  --   ALBUMIN  --  2.8*  --   AST  --  3,226*  --   ALT  --  1,193*  --   ALKPHOS  --  540*  --   BILITOT  --  3.4*  --   GFRNONAA 16* 15* 11*  ANIONGAP 25* 23* 16*     Hematology Recent Labs  Lab 11/10/2020 1130 11/08/2020 1537 11/20/2020 1638 11/02/20 0007 07-Nov-2020 0317  WBC 11.5*  --   --  15.7* 14.7*  RBC 3.53*  --   --  3.25* 3.59*  HGB 8.5*   < > 9.9* 7.8* 8.6*  HCT 26.7*   < > 29.0* 25.3* 27.8*  MCV 75.6*  --   --  77.8* 77.4*  MCH 24.1*  --   --  24.0* 24.0*  MCHC 31.8  --   --  30.8 30.9  RDW 17.7*  --   --  17.9* 18.5*  PLT 181  --   --  173 174   < > = values in this interval not displayed.    BNPNo results for input(s): BNP, PROBNP in the last 168 hours.   DDimer No results for input(s): DDIMER in the last 168 hours.   Radiology    DG Chest 1 View  Result Date: 11/28/2020 CLINICAL DATA:  Hypotension.  Hypoxia. EXAM: CHEST  1 VIEW COMPARISON:  10/29/2020 FINDINGS: Cardiomegaly remains stable. Aortic atherosclerotic calcification noted. Both lungs are clear. No evidence of pneumothorax. Linear lucencies are seen in the superior mediastinum. This may represent air within the esophagus, however pneumomediastinum cannot be excluded. IMPRESSION: Stable cardiomegaly. No active lung  disease. Linear lucencies in superior mediastinum may represent air within the esophagus, however pneumomediastinum cannot be excluded. Electronically Signed   By: Marlaine Hind M.D.   On: 11/25/2020 17:27   DG Chest Portable 1 View  Result Date: 11/02/2020 CLINICAL DATA:  Central line placement EXAM: PORTABLE CHEST 1 VIEW COMPARISON:  Portable exam 1833 hours compared to 11/11/2020 FINDINGS: LEFT jugular line with tip projecting over SVC. Enlargement of cardiac silhouette. Atherosclerotic calcifications thoracic aorta. Mediastinal contours and pulmonary vascularity otherwise normal. Minimal subsegmental atelectasis at medial LEFT lower lobe. Remaining lungs clear. No pleural effusion or pneumothorax. Osseous structures unremarkable. IMPRESSION: No acute abnormalities. Enlargement of cardiac silhouette. Aortic Atherosclerosis (ICD10-I70.0). Electronically Signed   By: Lavonia Dana M.D.   On: 11/02/2020 18:42   ECHOCARDIOGRAM COMPLETE  Result Date: 11/20/2020    ECHOCARDIOGRAM REPORT   Patient Name:   Gary [name] [facility] Date of Exam: 11/06/2020 Medical Rec #:  [id number]      Height:       66.0 in Accession #:    [id number]     Weight:       131.4 lb Date of Birth:  04/11/1956     BSA:          1.673 m Patient Age:    65 years       BP:           122/81 mmHg Patient Gender: M              HR:           90 bpm. Exam Location:  Inpatient Procedure: 2D Echo, Cardiac Doppler and Color Doppler Indications:    Shock  History:        Patient has no prior history of Echocardiogram examinations.                 Arrythmias:LBBB; Risk Factors:Hypertension, Diabetes, Current                 Smoker and Dyslipidemia. HFrEF. PAD. ESRD.  Sonographer:    Clayton Lefort RDCS (AE) Referring Phys: [id number] [place]  1. Left ventricular ejection fraction, by estimation, is <20%. Left ventricular ejection fraction by 3D volume is 18 %. The left ventricle has severely decreased function. The left ventricle demonstrates regional  wall motion abnormalities (consistent with multi-vessel disease).  Left ventricular diastolic parameters are indeterminate. There is the interventricular septum is flattened in diastole ('D' shaped left ventricle), consistent with right ventricular volume overload. There is no evidence of LV thrombus.  2. Right ventricular systolic function is moderately reduced. The right ventricular size is moderately enlarged. There is normal pulmonary artery systolic pressure. The estimated right ventricular systolic pressure is 56.3 mmHg.  3. Right atrial size was severely dilated.  4. The mitral valve is grossly normal. Moderate mitral valve regurgitation. No evidence of mitral stenosis.  5. 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  6. The aortic valve is tricuspid. There is moderate calcification of the aortic valve. There is severe thickening of the aortic valve. Aortic valve regurgitation is not visualized.  7. The inferior vena cava is normal in size with <50% respiratory variability, suggesting right atrial pressure of 8 mmHg. Comparison(s): No prior Echocardiogram. In comparison to prior study (chart review only from OSH) this represents a worsening in LVEF and valvular regurgitation. Conclusion(s)/Recommendation(s): Echo findings have been discussed with critical care and cardiology teams. FINDINGS  Left Ventricle: Left ventricular ejection fraction, by estimation, is <20%. Left ventricular ejection fraction by 3D volume is 18 %. The left ventricle has severely decreased function. The left ventricle demonstrates regional wall motion abnormalities. The left ventricular internal cavity size was normal in size. There is no left ventricular hypertrophy. The interventricular septum is flattened in diastole ('D' shaped left ventricle), consistent with right ventricular volume overload. Left ventricular diastolic parameters are indeterminate.  LV Wall Scoring: The mid and distal anterior  wall, entire lateral wall, entire apex, and entire inferior wall are akinetic. The anterior septum, mid inferoseptal segment, basal anterior segment, and basal inferoseptal segment are hypokinetic. Right Ventricle: The right ventricular size is moderately enlarged. No increase in right ventricular wall thickness. Right ventricular systolic function is moderately reduced. There is normal pulmonary artery systolic pressure. The tricuspid regurgitant velocity is 2.36 m/s, and with an assumed right atrial pressure of 8 mmHg, the estimated right ventricular systolic pressure is 87.5 mmHg. Left Atrium: Left atrial size was normal in size. Right Atrium: Right atrial size was severely dilated. Pericardium: There is no evidence of pericardial effusion. Mitral Valve: The mitral valve is grossly normal. Moderate mitral valve regurgitation. No evidence of mitral valve stenosis. Tricuspid Valve: The tricuspid valve is normal in structure. Tricuspid valve regurgitation is severe. No evidence of tricuspid stenosis. Aortic Valve: The aortic valve is tricuspid. There is moderate calcification of the aortic valve. There is severe thickening of the aortic valve. Aortic valve regurgitation is not visualized. Aortic valve mean gradient measures 3.0 mmHg. Aortic valve peak gradient measures 6.5 mmHg. Aortic valve area, by VTI measures 1.47 cm. Pulmonic Valve: The pulmonic valve was not well visualized. Pulmonic valve regurgitation is not visualized. No evidence of pulmonic stenosis. Aorta: The aortic root and ascending aorta are structurally normal, with no evidence of dilitation. Venous: The inferior vena cava is normal in size with less than 50% respiratory variability, suggesting right atrial pressure of 8 mmHg. IAS/Shunts: The atrial septum is grossly normal.  LEFT VENTRICLE PLAX 2D LVIDd:         5.30 cm LVIDs:         4.90 cm LV PW:         1.10 cm         3D Volume EF LV IVS:        1.00 cm  LV 3D EF:    Left LVOT diam:      1.80 cm                      ventricular LV SV:         24                           ejection LV SV Index:   14                           fraction by LVOT Area:     2.54 cm                     3D volume                                             is 18 %.  LV Volumes (MOD) LV vol d, MOD    174.0 ml      3D Volume EF: A4C:                           3D EF:        18 % LV SV MOD A4C:   174.0 ml      LV EDV:       263 ml                                LV ESV:       216 ml                                LV SV:        47 ml RIGHT VENTRICLE          IVC RV Basal diam:  5.30 cm  IVC diam: 1.90 cm RV Mid diam:    4.00 cm LEFT ATRIUM             Index       RIGHT ATRIUM           Index LA diam:        3.30 cm 1.97 cm/m  RA Area:     26.40 cm LA Vol (A2C):   72.9 ml 43.58 ml/m RA Volume:   107.00 ml 63.96 ml/m LA Vol (A4C):   39.1 ml 23.37 ml/m LA Biplane Vol: 52.6 ml 31.44 ml/m  AORTIC VALVE AV Area (Vmax):    1.60 cm AV Area (Vmean):   1.57 cm AV Area (VTI):     1.47 cm AV Vmax:           127.00 cm/s AV Vmean:          81.500 cm/s AV VTI:            0.165 m AV Peak Grad:      6.5 mmHg AV Mean Grad:      3.0 mmHg LVOT Vmax:         79.80 cm/s LVOT Vmean:        50.400 cm/s LVOT VTI:          0.095 m LVOT/AV VTI ratio:  0.38  AORTA Ao Root diam: 2.90 cm Ao Asc diam:  2.40 cm MR Peak grad:    76.4 mmHg   TRICUSPID VALVE MR Mean grad:    39.0 mmHg   TR Peak grad:   22.3 mmHg MR Vmax:         437.00 cm/s TR Vmax:        236.00 cm/s MR Vmean:        286.0 cm/s MR PISA:         1.57 cm    SHUNTS MR PISA Eff ROA: 14 mm      Systemic VTI:  0.10 m MR PISA Radius:  0.50 cm     Systemic Diam: 1.80 cm Rudean Haskell MD Electronically signed by Rudean Haskell MD Signature Date/Time: 11/21/2020/8:19:29 PM    Final    VAS [place] LOWER EXTREMITY ARTERIAL DUPLEX  Result Date: 11/02/2020 LOWER EXTREMITY ARTERIAL DUPLEX STUDY Patient Name:  Gary [name] [facility]  Date of Exam:   10/31/2020 Medical Rec #: [id number]       Accession #:     [id number] Date of Birth: 11-06-1955      Patient Gender: M Patient Age:   80Y Exam Location:  [facility] Procedure:      VAS [place] LOWER EXTREMITY ARTERIAL DUPLEX Referring Phys: [id number] ELIZABETH REES --------------------------------------------------------------------------------  Indications: Peripheral artery disease Worsening left lower extremity pain and              cold left foot. High Risk Factors: Hypertension, hyperlipidemia, Diabetes. 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 ESRD.  Vascular Interventions: Left common femoral, SFA and profundofemoral                         endarterectomy below-knee and stenting of SFA 10/28/20.                         Right BKA. Current ABI:            N/A Comparison Study: No prior study on file Performing Technologist: Sharion Dove RVS  Examination Guidelines: A complete evaluation includes B-mode imaging, spectral Doppler, color Doppler, and power Doppler as needed of all accessible portions of each vessel. Bilateral testing is considered an integral part of a complete examination. Limited examinations for reoccurring indications may be performed as noted.   +--------+--------+-----+--------+--------+--------+ LEFT    PSV cm/sRatioStenosisWaveformComments +--------+--------+-----+--------+--------+--------+ CFA [id number]                                    +--------+--------+-----+--------+--------+--------+  Summary: Left: SFA and popliteal stents appear occluded. No Dopplerable flow in the posterior tibial, peroneal, or anterior tibial arteries.  See table(s) above for measurements and observations. Electronically signed by Harold Barban MD on 11/22/2020 at 2:28:03 PM.    Final     Cardiac Studies   2D Echo: IMPRESSIONS     1. Left ventricular ejection fraction, by estimation, is <20%. Left  ventricular ejection fraction by 3D volume is 18 %. The left ventricle has  severely decreased function. The left ventricle  demonstrates regional wall  motion abnormalities (consistent  with multi-vessel disease). Left ventricular diastolic parameters are  indeterminate. There is the interventricular septum is flattened in  diastole ('D' shaped left ventricle), consistent with right ventricular  volume overload. There is no evidence of LV  thrombus.   2. Right  ventricular systolic function is moderately reduced. The right  ventricular size is moderately enlarged. There is normal pulmonary artery  systolic pressure. The estimated right ventricular systolic pressure is  56.3 mmHg.   3. Right atrial size was severely dilated.   4. The mitral valve is grossly normal. Moderate mitral valve  regurgitation. No evidence of mitral stenosis.   5. Tricuspid valve regurgitation is secondary (ventricular functional)  and severe.   6. The aortic valve is tricuspid. There is moderate calcification of the  aortic valve. There is severe thickening of the aortic valve. Aortic valve  regurgitation is not visualized.   7. The inferior vena cava is normal in size with <50% respiratory  variability, suggesting right atrial pressure of 8 mmHg.   Comparison(s): No prior Echocardiogram. In comparison to prior study  (chart review only from OSH) this represents a worsening in LVEF and  valvular regurgitation.   Conclusion(s)/Recommendation(s): Echo findings have been discussed with  critical care and cardiology teams.   Patient Profile     65 y.o. male with multiple comorbid conditions including end-stage renal disease, severe cardiomyopathy with chronic systolic heart failure, COPD, and peripheral arterial disease with history of right BKA, presenting with an ischemic left foot, developing profound hypotension with induction of anesthesia for vascular surgery.  Cardiology consultation requested to help with management  Assessment & Plan    1.  Elevated troponin non-STEMI versus demand ischemia: High-sensitivity troponin up to  1560 in the setting of shock and end-stage renal disease.  High likelihood of multivessel coronary artery disease, but no previous heart catheterization.  Plan for medical therapy only in this complex patient with multiple acute and chronic comorbid conditions.  I do not think he is candidate for a cardiac cath. He has severe LV systolic dysfunction.  -Stop IV heparin today from cardiac perspective.  -Medical therapy extremely limited in the setting of shock and end-stage renal disease.   -Continue ASA and Plavix  2.  End-stage renal disease: Management per nephrology  3.  Ischemic left leg: Limited treatment options in this patient at high risk for complication with vascular surgery.  He was hemodynamically unstable with shock developing during anesthesia for surgery.  Because of his severe underlying cardiac disease and multiple comorbidities, he will likely not tolerate limb salvage revascularization surgery.    4.  Shock, suspect mixed etiology: Wean Levophed as tolerated today    For questions or updates, please contact [place] Please consult [email or web address] for contact info under        Signed, Lauree Chandler, MD  Nov 20, 2020, 7:48 AM

## 2020-11-29 NOTE — Progress Notes (Addendum)
NAME:  Gary [name], MRN:  [id number], DOB:  Apr 11, 1956, LOS: 2 ADMISSION DATE:  11/13/2020, CONSULTATION DATE:  7/4 REFERRING MD:  Zhang-TRH, CHIEF COMPLAINT:     History of Present Illness:  65 y/o male with ESRD who presented to [facility] on 7/4 with acute LLE Pain.    He was recently admitted / discharged on 7/2 for extensive left lower extremity vascular intervention for advanced PVD.  This was complicated by arterial rupture and required a bovine patch. He was eventually discharged. He returned to the ER on 7/4 with an ischemic left leg. He was taken to the OR on 7/4 but he became profoundly hypotensive after induction so he was brought to the ICU for further management prior to any invasive intervention.   Pertinent  Medical History  PAD, recent L femoral end arterectomy ESRD on HD HTN DM  Significant Hospital Events: Including procedures, antibiotic start and stop dates in addition to other pertinent events   6/30 R femoral bypass surgery 7/2 discharged home 7/4 readmitted after completing dialysis with acute limb ischemia. After intubation he was hypotensive and procedure was cancelled. 7/4 echo LVEF < 25% RV systolic function moderately reduced, RA severely dilated 7/6 Remains on 50mcg levophed   Interim History / Subjective:  Afebrile Remains on levohped 75mcg's 7L salter O2  Glucose range 92-128  I/O +958 ml in last 24 hours   Objective   Blood pressure 125/70, pulse 96, temperature 97.8 F (36.6 C), temperature source Oral, resp. rate 15, height 5\' 6"  (1.676 m), weight 61 kg, SpO2 99 %.        Intake/Output Summary (Last 24 hours) at November 26, 2020 0925 Last data filed at 2020/11/26 0500 Gross per 24 hour  Intake 867.58 ml  Output --  Net 867.58 ml   Filed Weights   10/31/2020 1430 11/02/20 0500 Nov 26, 2020 0515  Weight: 59.6 kg 59.5 kg 61 kg    Examination: General: chronically ill appearing male lying in bed in NAD HEENT: MM pink/moist, [place]ms O2, anicteric  Neuro: AAOx4,  speech clear, MAE, guarded with movement due to LLE pain  CV: s1s2 RRR, no m/r/g PULM: non-labored at rest, clear bilaterally, on 7L salter O2 (monitoring on ear) GI: soft, bsx4 active  Extremities: warm/dry, no edema. R BKA. LLE first ray amputation, cold to the touch. LUE AV fistula with weak bruit / unable to palpate thrill   Skin: no rashes or lesions   Resolved Hospital Problem list     Assessment & Plan:   Acute LLE Ischemia  Recent left femoral revascularization. Failed attempt at salvage revascularization on 7/4 due to hemodynamic instability with anesthesia induction.  -continue DAPT with ASA, plavix -will need amputation of LLE  -continue ceftazidime given high risk infection in ischemic foot  -heparin gtt to stop 7/6 (ok from Cards / VVS perspective) -NPO after MN for possible amputation 7/7 or 7/8 (timing not confirmed)  Cardiogenic Shock after NSTEMI HFrEF  CAD with Ischemic Cardiomyopathy  Chronic LBBB After induction for anesthesia. Remains in shock, vasopressors requirements slightly increased.  Echo 7/4 with LVEF <20%, LV RWMA, RV overload, RA severely dilated, normal PAS pressure,  -levophed for MAP >65  -assess Co-Ox, CVP, lactic acid, troponin  -tele monitoring  -hold beta blocker  -ECHO as above, may need inotrope support  Shock Liver  Suspect multifactorial with congestive hepatopathy, shock -trend LFT's  -hold statin  -ensure BP support   Coagulopathy in setting of Shock Liver -Vitamin K 10 mg x1 -follow  up INR -assess DIC panel   Anemia  Suspect of chronic disease, critical illness, recent surgery -trend CBC  -transfuse for Hgb <7%  COPD -continue Anoro  Tobacco Abuse  -cessation counseling   ESRD on HD -Trend BMP  -follow HD response, may need CRRT if does not tolerate iHD -Replace electrolytes as indicated -Avoid nephrotoxic agents, ensure adequate renal perfusion  DM2 with Hyperglycemia -SSI, moderate scale    Best Practice  (right click and "Reselect all SmartList Selections" daily)  Diet/type: Regular consistency (see orders) DVT prophylaxis: systemic heparin GI prophylaxis: N/A Lines: N/A Foley:  N/A Code Status:  full code Last date of multidisciplinary goals of care discussion: pending.    Critical care time: 83 minutes    Noe Gens, MSN, APRN, NP-C, AGACNP-BC [facility] November 16, 2020, 9:25 AM   Please see [email or web address] for pager details.   From 7A-7P if no response, please call [phone] After hours, please call ELink [phone]

## 2020-11-29 NOTE — Progress Notes (Signed)
Pulled ET tube back 1 cm to 25 cm.

## 2020-11-29 NOTE — Progress Notes (Signed)
Consent for hemodialysis catheter via phone per wife.  Reviewed events of last hour with her to include cardiac arrest / VT requiring shock, brief period of stability, then increasing vasopressor use, second episode of near arrest, and intubation.  Wife planning to visit this afternoon.     Noe Gens, MSN, APRN, NP-C, AGACNP-BC [facility] 12-01-20, 12:35 PM   Please see [email or web address] for pager details.   From 7A-7P if no response, please call [phone] After hours, please call ELink [phone]

## 2020-11-29 NOTE — Progress Notes (Signed)
This nurse spoke to family and patient at this time.  Patient nodded and answered questions appropriately at this time. Patient nodded yes to being ready to extubate.   Pt's wife and family members at bedside.   Nurse called Elink to verify comfort care orders.   RT called at this time for extubation.

## 2020-11-29 NOTE — Procedures (Signed)
Intubation Procedure Note  Gary [name]  [id number]  08-15-1955  Date:11-06-2020  Time:12:03 PM   Provider Performing:[name] D Rollene Rotunda    Procedure: Intubation (40698)  Indication(s) Respiratory Failure  Consent Unable to obtain consent due to emergent nature of procedure.   Anesthesia Etomidate, Fentanyl, and Rocuronium   Time Out Verified patient identification, verified procedure, site/side was marked, verified correct patient position, special equipment/implants available, medications/allergies/relevant history reviewed, required imaging and test results available.   Sterile Technique Usual hand hygeine, masks, and gloves were used   Procedure Description Patient positioned in bed supine.  Sedation given as noted above.  Patient was intubated with endotracheal tube using  Mac 4 .  View was Grade 2 only posterior commissure .  Number of attempts was 1.  Colorimetric CO2 detector was consistent with tracheal placement.   Complications/Tolerance None; patient tolerated the procedure well. Chest X-ray is ordered to verify placement.   EBL Minimal   Specimen(s) None  JD Rexene Agent [facility] 2020-11-06, 12:04 PM  Please see [email or web address] for pager details.  From 7A-7P if no response, please call [phone]. After hours, please call ELink [phone].

## 2020-11-29 NOTE — Progress Notes (Signed)
Attending:    Subjective: Leg is more painful today Pain with minimal movement in the bed Vasopressor needs increased slightly today WBC unchanged INR elevated Remains on heparin infusion No appetite  Objective: Vitals:   November 12, 2020 0630 November 12, 2020 0645 Nov 12, 2020 0705 11-12-2020 0756  BP:      Pulse: 95 94 96   Resp: 16 10 15    Temp:   97.8 F (36.6 C)   TempSrc:   Oral   SpO2: 92% 93% (!) 76% 99%  Weight:      Height:          Intake/Output Summary (Last 24 hours) at 11-12-2020 0955 Last data filed at November 12, 2020 0500 Gross per 24 hour  Intake 867.58 ml  Output --  Net 867.58 ml    General:  Resting comfortably in bed HENT: NCAT OP clear PULM: CTA B, normal effort CV: RRR, no mgr GI: BS+, soft, nontender MSK: left leg darker today/more dusky; s/p R BKA Neuro: awake, alert, no distress, MAEW   Echo 7/5 LVEF < 26% RV systolic function moderately reduced, RA severely dilated  CBC    Component Value Date/Time   WBC 14.7 (H) November 12, 2020 0317   RBC 3.59 (L) Nov 12, 2020 0317   HGB 8.6 (L) 2020-11-12 0317   HCT 27.8 (L) 2020/11/12 0317   PLT 174 2020-11-12 0317   MCV 77.4 (L) 11/12/20 0317   MCH 24.0 (L) 11-12-20 0317   MCHC 30.9 12-Nov-2020 0317   RDW 18.5 (H) 11-12-20 0317   LYMPHSABS 1.6 11/12/2020 1130   MONOABS 1.0 11/22/2020 1130   EOSABS 0.0 11/25/2020 1130   BASOSABS 0.0 11/17/2020 1130    BMET    Component Value Date/Time   NA 130 (L) 11/12/2020 0317   K 4.1 Nov 12, 2020 0317   CL 91 (L) 11/12/20 0317   CO2 23 November 12, 2020 0317   GLUCOSE 126 (H) Nov 12, 2020 0317   BUN 36 (H) 11-12-20 0317   CREATININE 5.43 (H) 12-Nov-2020 0317   CREATININE 0.86 09/26/2012 1536   CALCIUM 7.4 (L) 11/12/20 0317   GFRNONAA 11 (L) 11/12/20 0317   GFRNONAA >89 09/26/2012 1536   GFRAA 6 (L) 06/12/2018 1056   GFRAA >89 09/26/2012 1536    CXR images none today  Impression/Plan: Cardiogenic shock> f/u lactic acid, CVP today, continue norepi for MAP > 65; may need  to consider inotrope (dobuatmine vs milrinone) if he desires more aggressive care ESRD > will ask renal to attempt iHD today, will need vasopressors support Ischemic leg > needs salvage amputation soon (ideally in next 24 hours) continue ceftaz today; attempt fentanyl today as oxycodone too strong (drowsy), continue heparin infusion Shock liver> monitor LFT COagulopathy> due to shock liver? Vit K, will need FFP for amputation  I will discuss goals of care with him today: at this point he may need more aggressive therapy (inotrope support, brief trial of mechanical vent, CRRT) even to survive a salvage amputation.  I expressed my concern that he would likely not survive that degree of support.  However Gary [name] would prefer to attempt CRRT, life support, and have CPR if necessary.  I had this conversation with his wife present by phone.  Would appreciate palliative care consultation today.  Rest as per NP note  My cc time 31 minutes  Roselie Awkward, MD [facility] Pager: [phone] Cell: [phone] After 3pm or if no response, call [phone]

## 2020-11-29 NOTE — Progress Notes (Signed)
PHARMACY NOTE:  ANTIMICROBIAL RENAL DOSAGE ADJUSTMENT  Current antimicrobial regimen includes a mismatch between antimicrobial dosage and estimated renal function.  As per policy approved by the Pharmacy & Therapeutics and Medical Executive Committees, the antimicrobial dosage will be adjusted accordingly.  Current antimicrobial dosage: fortaz 1gm IV q24h  Indication: L Leg ischemia with high risk infection  Renal Function:  Estimated Creatinine Clearance: 11.9 mL/min (A) (by C-G formula based on SCr of 5.43 mg/dL (H)). []      On intermittent HD, scheduled: [x]      On CRRT    Antimicrobial dosage has been changed to:  Fortaz 2gm IV q12h  Additional comments:   Thank you for allowing pharmacy to be a part of this patient's care.  Hildred Laser, PharmD Clinical Pharmacist **Pharmacist phone directory can now be found on [email or web address] ([email or web address]).  Listed under [place].

## 2020-11-29 NOTE — Death Summary Note (Signed)
DEATH SUMMARY   Patient Details  Name: Gary [name] MRN: [id number] DOB: [birth year]  Admission/Discharge Information   Admit Date:  2020-12-01  Date of Death: Date of Death: Dec 03, 2020  Time of Death: Time of Death: 2043-08-21  Length of Stay: 2  Referring Physician: Janifer Adie, MD   Reason(s) for Hospitalization  Leg pain  Diagnoses  Preliminary cause of death:  Cardiogenic shock Secondary Diagnoses (including complications and co-morbidities):  Active Problems:   Acute respiratory failure with hypoxia (HCC)   Limb ischemia   S/P femoral-popliteal bypass surgery   Critical lower limb ischemia ([place])   Encounter for central line placement   Cardiac arrest, cause unspecified [facility]) DNR status Comfort measures Palliative care consultation  Brief Hospital Course (including significant findings, care, treatment, and services provided and events leading to death)  Gary [name] is a 65 y.o. year old male who has advanced systolic heart failure, peripheral vascular disease and ESRD who presented to our facility complaining of leg pain.  He had just been seen here and managed for his PVD where he required bypass surgery.  He returned with severe leg pain and ischemia and was offered amputation vs re-vascularization.  He elected for revascularization but he was unable to have this due to severe cardiogenic shock.  We attempted to medically optimize him in the cardiac ICU with vasopressors but he developed VT arrest and worsening cardiogenic shock.  However by this point he required mechanical ventilation and multiple vaspopressors.  We attempted CRRT. However despite all this the patient's shock deteriorated and he was no longer healthy enough to go to the OR.  We explained this to his family and they elected to withdraw care with comfort measures.   Palliative care was consulted   Pertinent Labs and Studies  Significant Diagnostic Studies DG Chest 1 View  Result Date:  2020-12-01 CLINICAL DATA:  Hypotension.  Hypoxia. EXAM: CHEST  1 VIEW COMPARISON:  10/29/2020 FINDINGS: Cardiomegaly remains stable. Aortic atherosclerotic calcification noted. Both lungs are clear. No evidence of pneumothorax. Linear lucencies are seen in the superior mediastinum. This may represent air within the esophagus, however pneumomediastinum cannot be excluded. IMPRESSION: Stable cardiomegaly. No active lung disease. Linear lucencies in superior mediastinum may represent air within the esophagus, however pneumomediastinum cannot be excluded. Electronically Signed   By: Marlaine Hind M.D.   On: 12/01/20 17:27   PERIPHERAL VASCULAR CATHETERIZATION  Result Date: 10/28/2020 Images from the original result were not included. Patient name: Gary [name] MRN: [id number] DOB: 1956-01-15 Sex: male 10/28/2020 Pre-operative Diagnosis: Chronic left lower extremity limb threatening ischemia with toe ulceration Post-operative diagnosis:  Same Surgeon:  Erlene Quan C. Donzetta Matters, MD Procedure Performed: 1.  Ultrasound-guided cannulation left common femoral artery in an antegrade fashion 2.  Left lower extremity angiography 3.  Ultrasound-guided cannulation left posterior tibial artery 4.  Laser arthrectomy left SFA, popliteal artery, tibioperoneal trunk and posterior tibial artery 5.  Balloon angioplasty of left SFA with 5 mm balloon 6.  Stent of left popliteal artery with 5 mm bio mimics 7.  Plain balloon angioplasty left peroneal and posterior tibial arteries with 3 mm balloon 8.  Moderate sedation with fentanyl and Versed for 171 minutes Indications: 65 year old male with right lower extremity amputation and history of an aortobifemoral bypass graft remotely.  He now has left second toe ulceration he is indicated for angiography with possible intervention. Findings: Left common femoral artery did have significant scar tissue in the left common femoral artery was heavily  diseased.  After cannulated with micropuncture needle I  placed contrast it appeared that the SFA had stenosis proximally and the flow was directed into the profunda.  I was able to direct a wire into the SFA and then place sheath.  The SFA was severely diseased multiple areas of 90% stenosis and/or occlusions.  It did frankly occlude at the level of the knee and the runoff was via the peroneal artery.  I was able to get through the all of the stenoses and occlusions initially perform laser arthrectomy down the tibioperoneal trunk I had runoff via the peroneal artery.  I cannulated the posterior tibial artery retrograde in an effort to get retrograde around the SFA sheath to treat the proximal disease.  Ultimately I did have better flow in the posterior tibial artery peroneal artery appeared to occlude after balloon angioplasty of the posterior tibial artery.  The SFA was heavily diseased.  Unfortunately had dissection from both antegrade and retrograde area up into the common femoral artery.  With this I have elected for left lower extremity thrombectomy.  I did place a stent to the left popliteal artery which resolved the previously occluded area to less than 20% stenosis.  Dominant runoff is via the posterior tibial artery at completion. Patient is going emergently to the operating room for left lower extremity thrombectomy, left lower extremity angiography and possible bypass.  Procedure:  The patient was identified in the holding area and taken to room 8.  The patient was then placed supine on the table and prepped and draped in the usual sterile fashion.  A time out was called.  Ultrasound was used to evaluate the left common femoral artery.  This was heavily diseased.  There is anesthetized 1% lidocaine cannulated into antegrade fashion with micropuncture needle followed by wire.  Initially the wire was going down the profunda.  Performed angiography which demonstrated stenosis at the proximal SFA.  I was able to direct a wire into the SFA and placed a  micropuncture sheath and a Bentson wire followed by 5 [place] sheath.  I initially cannulated with ultrasound guidance and an image saved per record.  I perform left lower extremity angiography with the above findings.  I then placed a Glidewire advantage down to the level of popliteal artery where the occluded placed a long 6 French sheath did have to dilate with a 7 [place] dilator.  Patient was fully heparinized at this time.  I did get antegrade through the occluded popliteal artery using Glidewire advantage quick cross catheter.  I have confirmed intraluminal access in the peroneal artery.  I then performed laser arthrectomy of the SFA popliteal tibioperoneal trunk and the peroneal artery.  I then performed primary balloon angioplasty.  Unfortunately proximally where the sheath was in the SFA there was severe disease appear to be dissected.  Did ultimately stent the left popliteal artery to 0% residual stenosis were previously was occluded.  I also ballooned to the peroneal tibioperoneal trunk there was good resolution of stenosis there as well.  Proximally where there was significant stenoses and occlusions in the SFA it did appear dissected.  I elected that I would need posterior tibial access to get up retrograde to attempt to treat the proximal SFA around the common femoral sheath.  I cannulated the left posterior tibial artery with micropuncture needle followed by wire and sheath using ultrasound guidance.  Image saved per record.  I was able to drive a V 18 wire directly up to the  tibioperoneal trunk bifurcation and then crossed over with CXI cath and confirmed intraluminal access.  I think at this wire all the way into the SFA.  I performed laser arthrectomy of the posterior tibial artery from the tibioperoneal trunk and then balloon angioplasty with 3 mm balloon.  Completion demonstrated that there was no residual stenosis in the posterior tibial artery however the peroneal artery did not fill antegrade  at this time.  I elected that I would leave the posterior tibial artery as the main runoff.  I then went into the SFA and perform retrograde angiography.  Ultimately I had snared the wire in a retrograde fashion to do this performing laser arthrectomy and balloon angioplasty.  I attempted now to redirect a wire around the sheath but it was in a dissection plane via angiography.  I elected that there was no way that I would be able to get out of the dissection plane and the patient would need surgical intervention.  I did have the balloon angioplasty of the posterior tibial artery again to 0% residual stenosis.  I then remove the sheath from the posterior tibial artery and pressure was held.  I exchanged for short 6 French sheath in the left groin and this will be removed in surgery.  He did tolerate the procedure well with complication of dissection of the left common femoral artery and will require operative intervention. Contrast: 110cc Brandon C. Donzetta Matters, MD [facility] Office: [phone] Pager: [phone]  PERIPHERAL VASCULAR CATHETERIZATION  Result Date: 10/12/2020 Images from the original result were not included. Patient name: Gary [name] MRN: [id number] DOB: 1955/08/03 Sex: male 10/12/2020 Pre-operative Diagnosis: Left second toe ulcer Post-operative diagnosis:  Same Surgeon:  Annamarie Major Procedure Performed:  1.  Ultrasound-guided access, right femoral artery  2.  Abdominal aortogram  3.  First order catheterization  4.  Left lower extremity runoff  5.  Conscious sedation, 54 minutes  6.  Closure device ([place])  Indications: This is a 65 year old gentleman with history of aortobifemoral bypass graft.  He has an issue with his left second toe.  He comes in today for angiographic evaluation. Procedure:  The patient was identified in the holding area and taken to room 8.  The patient was then placed supine on the table and prepped and draped in the usual sterile fashion.   A time out was called.  Conscious sedation was administered with the use of IV fentanyl and Versed under continuous physician and nurse monitoring.  Heart rate, blood pressure, and oxygen saturation were continuously monitored.  Total sedation time was 54 minutes.  Ultrasound was used to evaluate the right common femoral artery.  It was patent .  A digital ultrasound image was acquired.  A micropuncture needle was used to access the right common femoral artery under ultrasound guidance.  An 018 wire was advanced without resistance and a micropuncture sheath was placed.  The 018 wire was removed and a benson wire was placed.  The micropuncture sheath was exchanged for a 5 french sheath.  An omniflush catheter was advanced over the wire to the level of L-1.  An abdominal angiogram was obtained.  Next, using the omniflush catheter and a benson wire, the aortic bifurcation was crossed and the catheter was placed into theright common iliac artery and right runoff was obtained.  Findings:  Aortogram: No significant renal artery stenosis.  An aortobifemoral bypass graft is visualized coming off of the infrarenal aorta and a end-to-side configuration.  Both limbs of the graft are widely patent.  Right Lower Extremity: Not evaluated  Left Lower Extremity: The left common femoral and profundofemoral artery are patent throughout their course.  There is mild to moderate diffuse long segment disease within the superficial femoral artery.  The popliteal artery is occluded above the knee with reconstitution of the peroneal artery which is the single-vessel runoff.  There is collateralization and reconstitution of the posterior tibial artery in the lower leg however this does not fully perfuse the foot as there is small vessel disease. Intervention: None Impression:  #1  No significant aortoiliac occlusive disease  #2  Left popliteal occlusion with reconstitution of the peroneal artery which is his single-vessel runoff  #3  Patient  will need to be evaluated for a left femoral peroneal bypass.  Theotis Burrow, M.D., FACS [facility] [place] Office: [phone] Pager:  [phone]   DG Chest Port 1 View  Result Date: 2020/11/07 CLINICAL DATA:  Intubation. EXAM: PORTABLE CHEST 1 VIEW COMPARISON:  Chest x-ray from yesterday. FINDINGS: New endotracheal tube with the tip in good position 2.7 cm above the carina. New enteric tube in the stomach. New right internal jugular central venous catheter with tip in the SVC. Unchanged left internal jugular central venous catheter. Unchanged mild cardiomegaly. Normal pulmonary vascularity. No focal consolidation, pleural effusion, or pneumothorax. No acute osseous abnormality. IMPRESSION: 1. Appropriately positioned endotracheal and enteric tubes. New right internal jugular central venous catheter without complicating feature. 2. No active disease. Electronically Signed   By: Titus Dubin M.D.   On: 11/07/2020 14:31   DG Chest Portable 1 View  Result Date: 11/02/2020 CLINICAL DATA:  Central line placement EXAM: PORTABLE CHEST 1 VIEW COMPARISON:  Portable exam 1833 hours compared to 10/31/2020 FINDINGS: LEFT jugular line with tip projecting over SVC. Enlargement of cardiac silhouette. Atherosclerotic calcifications thoracic aorta. Mediastinal contours and pulmonary vascularity otherwise normal. Minimal subsegmental atelectasis at medial LEFT lower lobe. Remaining lungs clear. No pleural effusion or pneumothorax. Osseous structures unremarkable. IMPRESSION: No acute abnormalities. Enlargement of cardiac silhouette. Aortic Atherosclerosis (ICD10-I70.0). Electronically Signed   By: Lavonia Dana M.D.   On: 11/02/2020 18:42   DG CHEST PORT 1 VIEW  Result Date: 10/29/2020 CLINICAL DATA:  Shortness of breath EXAM: PORTABLE CHEST 1 VIEW COMPARISON:  09/23/2020 FINDINGS: 18 degrees dextroconvex thoracic scoliosis as measured between T4 and T8. Atherosclerotic calcification of the  aortic arch. Mild enlargement of the cardiopericardial silhouette, without edema. The lungs appear clear. No blunting of the costophrenic angles. IMPRESSION: 1. Mild enlargement of the cardiopericardial silhouette, without edema. The lungs appear clear. 2.  Aortic Atherosclerosis (ICD10-I70.0). 3. Dextroconvex thoracic scoliosis. Electronically Signed   By: Van Clines M.D.   On: 10/29/2020 14:31   VAS [place] ABI WITH/WO TBI  Result Date: 10/05/2020  LOWER EXTREMITY DOPPLER STUDY Patient Name:  Gary [name]  Date of Exam:   10/05/2020 Medical Rec #: [id number]       Accession #:    [id number] Date of Birth: [birth year]      Patient Gender: M Patient Age:   64Y Exam Location:  [facility] Procedure:      VAS [place] ABI WITH/WO TBI Referring Phys: [id number] Serafina Mitchell --------------------------------------------------------------------------------  Indications: Discoloration of the left 2nd toe, peripheral artery disease, and              right BKA. High Risk Factors: Hypertension, hyperlipidemia, Diabetes, current smoker. Other Factors: Left AVF.  Comparison  Study: Prior exam of 09/03/2019 revealed biphasic waveforms on the                   left. Waveforms are now dampened monophasic. Performing Technologist: Alvia Grove RVT  Examination Guidelines: A complete evaluation includes at minimum, Doppler waveform signals and systolic blood pressure reading at the level of bilateral brachial, anterior tibial, and posterior tibial arteries, when vessel segments are accessible. Bilateral testing is considered an integral part of a complete examination. Photoelectric Plethysmograph (PPG) waveforms and toe systolic pressure readings are included as required and additional duplex testing as needed. Limited examinations for reoccurring indications may be performed as noted.  ABI Findings: +--------+------------------+-----+--------+--------+ Right   Rt Pressure (mmHg)IndexWaveformComment   +--------+------------------+-----+--------+--------+ [id number]                                     +--------+------------------+-----+--------+--------+ +---------+------------------+-----+-------------------+-------+ Left     Lt Pressure (mmHg)IndexWaveform           Comment +---------+------------------+-----+-------------------+-------+ Brachial                                           AVF     +---------+------------------+-----+-------------------+-------+ PTA      >254              1.90 dampened monophasic        +---------+------------------+-----+-------------------+-------+ DP       70                0.52 dampened monophasic        +---------+------------------+-----+-------------------+-------+ Great Toe18                0.13 Abnormal                   +---------+------------------+-----+-------------------+-------+ +-------+-----------+------------+------------+--------------------+ ABI/TBIToday's ABIToday's TBI Previous ABIPrevious TBI         +-------+-----------+------------+------------+--------------------+ Right  BKA                    BKA                              +-------+-----------+------------+------------+--------------------+ Left   [place]         0.13 2nd toeNC          great toe amputation +-------+-----------+------------+------------+--------------------+   Summary:  Right BKA. Left: Resting left ankle-brachial index indicates noncompressible left lower extremity arteries. The left 2nd toe-brachial index is abnormal.  *See table(s) above for measurements and observations.  Electronically signed by Monica Martinez MD on 10/05/2020 at 2:46:52 PM.    Final    VAS [place] LOWER EXT [place]  Result Date: 10/23/2020 [place] Patient Name:  Gary [name] [facility]  Date of Exam:   10/22/2020 Medical Rec #: [id number]       Accession #:    [id number] Date of Birth: June 30, 1955      Patient Gender: M Patient Age:   64Y  Exam Location:  [facility] Procedure:      VAS [place] LOWER EXTREMITY SAPHENOUS VEIN MAPPING Referring Phys: [id number] Serafina Mitchell --------------------------------------------------------------------------------  Indications: Pre-op  Performing Technologist: Alvia Grove RVT  Examination Guidelines: A complete evaluation includes B-mode imaging, spectral Doppler, color Doppler, and power Doppler as  needed of all accessible portions of each vessel. Bilateral testing is considered an integral part of a complete examination. Limited examinations for reoccurring indications may be performed as noted. +---------------+-----------+---------------------+----------------+-----------+  Diameter (cm) RT Findings         GSV         LT Diameter (cm)LT Findings      RUGHT                                                                 +---------------+-----------+---------------------+----------------+-----------+      0.47          BKA       Saphenofemoral          0.40                                                  Junction                                   +---------------+-----------+---------------------+----------------+-----------+      0.35                    Proximal thigh      0.29 / 0.32               +---------------+-----------+---------------------+----------------+-----------+      0.27                       Mid thigh            0.21                  +---------------+-----------+---------------------+----------------+-----------+      0.41                     Distal thigh           0.26                  +---------------+-----------+---------------------+----------------+-----------+        -           BKA            Knee               0.27       branching  +---------------+-----------+---------------------+----------------+-----------+        -                        Prox calf            0.24                   +---------------+-----------+---------------------+----------------+-----------+        -                        Mid calf         0.24 / 0.21               +---------------+-----------+---------------------+----------------+-----------+        -  Distal calf           0.16                  +---------------+-----------+---------------------+----------------+-----------+ +-------------+-----------+---------------+----------------+-----------+ diameter (cm)RT Findings      SSV      LT Diameter (cm)LT Findings +-------------+-----------+---------------+----------------+-----------+       -                 Popliteal fossa      0.39       branching  +-------------+-----------+---------------+----------------+-----------+       -                  Proximal calf   0.43 / 0.40               +-------------+-----------+---------------+----------------+-----------+       -                    Mid calf          0.35                  +-------------+-----------+---------------+----------------+-----------+       -                   Distal calf    0.37 / 0.27    branching  +-------------+-----------+---------------+----------------+-----------+ Summary:  Left: Measurements above.  *See table(s) above for measurements and observations. Diagnosing physician: Servando Snare MD Electronically signed by Servando Snare MD on 10/23/2020 at 1:33:55 PM.    Final    MYOCARDIAL PERFUSION IMAGING  Result Date: 10/19/2020  Nuclear stress EF: 13%.  The left ventricular ejection fraction is severely decreased (<30%).  Defect 1: There is a large defect of moderate severity present in the basal inferoseptal, basal inferior, basal inferolateral, mid inferior, mid inferolateral and apical inferior location.  Defect 2: There is a small defect of moderate severity present in the apex location.  Findings consistent with prior myocardial infarction.  This is a high risk study.  1. Fixed  perfusion defect at inferior/inferolateral walls and apex consistent with prior infarct, though there is significant extracardiac radiotracer uptake adjacent to inferior/inferolateral walls that could be causing artifact 2. High risk study due to severe systolic dysfunction (EF 80%).  No ischemia.   ECHOCARDIOGRAM COMPLETE  Result Date: 10/31/2020    ECHOCARDIOGRAM REPORT   Patient Name:   Gary [name] [facility] Date of Exam: 11/04/2020 Medical Rec #:  [id number]      Height:       66.0 in Accession #:    [id number]     Weight:       131.4 lb Date of Birth:  Jan 01, 1956     BSA:          1.673 m Patient Age:    2 years       BP:           122/81 mmHg Patient Gender: M              HR:           90 bpm. Exam Location:  Inpatient Procedure: 2D Echo, Cardiac Doppler and Color Doppler Indications:    Shock  History:        Patient has no prior history of Echocardiogram examinations.                 Arrythmias:LBBB; Risk Factors:Hypertension, Diabetes, Current  Smoker and Dyslipidemia. HFrEF. PAD. ESRD.  Sonographer:    Clayton Lefort RDCS (AE) Referring Phys: [id number] [place]  1. Left ventricular ejection fraction, by estimation, is <20%. Left ventricular ejection fraction by 3D volume is 18 %. The left ventricle has severely decreased function. The left ventricle demonstrates regional wall motion abnormalities (consistent with multi-vessel disease). Left ventricular diastolic parameters are indeterminate. There is the interventricular septum is flattened in diastole ('D' shaped left ventricle), consistent with right ventricular volume overload. There is no evidence of LV thrombus.  2. Right ventricular systolic function is moderately reduced. The right ventricular size is moderately enlarged. There is normal pulmonary artery systolic pressure. The estimated right ventricular systolic pressure is 14.4 mmHg.  3. Right atrial size was severely dilated.  4. The mitral valve is grossly normal. Moderate  mitral valve regurgitation. No evidence of mitral stenosis.  5. 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  6. The aortic valve is tricuspid. There is moderate calcification of the aortic valve. There is severe thickening of the aortic valve. Aortic valve regurgitation is not visualized.  7. The inferior vena cava is normal in size with <50% respiratory variability, suggesting right atrial pressure of 8 mmHg. Comparison(s): No prior Echocardiogram. In comparison to prior study (chart review only from OSH) this represents a worsening in LVEF and valvular regurgitation. Conclusion(s)/Recommendation(s): Echo findings have been discussed with critical care and cardiology teams. FINDINGS  Left Ventricle: Left ventricular ejection fraction, by estimation, is <20%. Left ventricular ejection fraction by 3D volume is 18 %. The left ventricle has severely decreased function. The left ventricle demonstrates regional wall motion abnormalities. The left ventricular internal cavity size was normal in size. There is no left ventricular hypertrophy. The interventricular septum is flattened in diastole ('D' shaped left ventricle), consistent with right ventricular volume overload. Left ventricular diastolic parameters are indeterminate.  LV Wall Scoring: The mid and distal anterior wall, entire lateral wall, entire apex, and entire inferior wall are akinetic. The anterior septum, mid inferoseptal segment, basal anterior segment, and basal inferoseptal segment are hypokinetic. Right Ventricle: The right ventricular size is moderately enlarged. No increase in right ventricular wall thickness. Right ventricular systolic function is moderately reduced. There is normal pulmonary artery systolic pressure. The tricuspid regurgitant velocity is 2.36 m/s, and with an assumed right atrial pressure of 8 mmHg, the estimated right ventricular systolic pressure is 31.5 mmHg. Left Atrium: Left atrial size was  normal in size. Right Atrium: Right atrial size was severely dilated. Pericardium: There is no evidence of pericardial effusion. Mitral Valve: The mitral valve is grossly normal. Moderate mitral valve regurgitation. No evidence of mitral valve stenosis. Tricuspid Valve: The tricuspid valve is normal in structure. Tricuspid valve regurgitation is severe. No evidence of tricuspid stenosis. Aortic Valve: The aortic valve is tricuspid. There is moderate calcification of the aortic valve. There is severe thickening of the aortic valve. Aortic valve regurgitation is not visualized. Aortic valve mean gradient measures 3.0 mmHg. Aortic valve peak gradient measures 6.5 mmHg. Aortic valve area, by VTI measures 1.47 cm. Pulmonic Valve: The pulmonic valve was not well visualized. Pulmonic valve regurgitation is not visualized. No evidence of pulmonic stenosis. Aorta: The aortic root and ascending aorta are structurally normal, with no evidence of dilitation. Venous: The inferior vena cava is normal in size with less than 50% respiratory variability, suggesting right atrial pressure of 8 mmHg. IAS/Shunts: The atrial septum is grossly normal.  LEFT VENTRICLE PLAX 2D LVIDd:  5.30 cm LVIDs:         4.90 cm LV PW:         1.10 cm         3D Volume EF LV IVS:        1.00 cm         LV 3D EF:    Left LVOT diam:     1.80 cm                      ventricular LV SV:         24                           ejection LV SV Index:   14                           fraction by LVOT Area:     2.54 cm                     3D volume                                             is 18 %.  LV Volumes (MOD) LV vol d, MOD    174.0 ml      3D Volume EF: A4C:                           3D EF:        18 % LV SV MOD A4C:   174.0 ml      LV EDV:       263 ml                                LV ESV:       216 ml                                LV SV:        47 ml RIGHT VENTRICLE          IVC RV Basal diam:  5.30 cm  IVC diam: 1.90 cm RV Mid diam:    4.00 cm  LEFT ATRIUM             Index       RIGHT ATRIUM           Index LA diam:        3.30 cm 1.97 cm/m  RA Area:     26.40 cm LA Vol (A2C):   72.9 ml 43.58 ml/m RA Volume:   107.00 ml 63.96 ml/m LA Vol (A4C):   39.1 ml 23.37 ml/m LA Biplane Vol: 52.6 ml 31.44 ml/m  AORTIC VALVE AV Area (Vmax):    1.60 cm AV Area (Vmean):   1.57 cm AV Area (VTI):     1.47 cm AV Vmax:           127.00 cm/s AV Vmean:          81.500 cm/s AV VTI:            0.165 m AV Peak Grad:  6.5 mmHg AV Mean Grad:      3.0 mmHg LVOT Vmax:         79.80 cm/s LVOT Vmean:        50.400 cm/s LVOT VTI:          0.095 m LVOT/AV VTI ratio: 0.58  AORTA Ao Root diam: 2.90 cm Ao Asc diam:  2.40 cm MR Peak grad:    76.4 mmHg   TRICUSPID VALVE MR Mean grad:    39.0 mmHg   TR Peak grad:   22.3 mmHg MR Vmax:         437.00 cm/s TR Vmax:        236.00 cm/s MR Vmean:        286.0 cm/s MR PISA:         1.57 cm    SHUNTS MR PISA Eff ROA: 14 mm      Systemic VTI:  0.10 m MR PISA Radius:  0.50 cm     Systemic Diam: 1.80 cm Rudean Haskell MD Electronically signed by Rudean Haskell MD Signature Date/Time: 11/16/2020/8:19:29 PM    Final    OCT, Retina - OU - Both Eyes  Result Date: 10/26/2020 Right Eye Quality was good. Central Foveal Thickness: 236. Progression has no prior data. Findings include normal foveal contour, intraretinal fluid, no SRF, preretinal fibrosis (Focal IRF/cystic changes nasal macula, focal PRF on inferior disc caught on widefield). Left Eye Quality was good. Central Foveal Thickness: 237. Progression has no prior data. Findings include normal foveal contour, no IRF, no SRF (Partial PVD, mild diffuse atrophy). Notes *Images captured and stored on drive Diagnosis / Impression: OD: Focal IRF/cystic changes nasal macula, focal PRF on inferior disc caught on widefield OS: NFP; no IRF/SRF Clinical management: See below Abbreviations: NFP - Normal foveal profile. CME - cystoid macular edema. PED - pigment epithelial detachment.  IRF - intraretinal fluid. SRF - subretinal fluid. EZ - ellipsoid zone. ERM - epiretinal membrane. ORA - outer retinal atrophy. ORT - outer retinal tubulation. SRHM - subretinal hyper-reflective material. IRHM - intraretinal hyper-reflective material   Fluorescein Angiography Optos (Transit OD)  Result Date: 10/26/2020 Right Eye Progression has no prior data. Early phase findings include delayed filling, microaneurysm, vascular perfusion defect, staining (Low fluorescein signal). Mid/Late phase findings include leakage, microaneurysm, vascular perfusion defect, staining (Mild focal leakage from disc and nasal venules). Left Eye Progression has no prior data. Early phase findings include microaneurysm, vascular perfusion defect (Low fluorescein signal). Mid/Late phase findings include microaneurysm, leakage, vascular perfusion defect (Focal perivascular leakage along IT and SN arcades). Notes **Images stored on drive** Impression: Low fluorescein signal Early PDR OU OD: Mild focal leakage from disc and nasal venules OS: Focal perivascular leakage along IT and SN arcades  VAS [place] LOWER EXTREMITY ARTERIAL DUPLEX  Result Date: 10/29/2020 LOWER EXTREMITY ARTERIAL DUPLEX STUDY Patient Name:  [name]  Date of Exam:   11/09/2020 Medical Rec #: [id number]       Accession #:    [id number] Date of Birth: October 02, 1955      Patient Gender: M Patient Age:   54Y Exam Location:  [facility] Procedure:      VAS [place] LOWER EXTREMITY ARTERIAL DUPLEX Referring Phys: [place] --------------------------------------------------------------------------------  Indications: Peripheral artery disease Worsening left lower extremity pain and              cold left foot. High Risk Factors: Hypertension, hyperlipidemia, Diabetes. 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 ESRD.  Vascular Interventions: Left common femoral, SFA and profundofemoral                         endarterectomy below-knee and  stenting of SFA 10/28/20.                         Right BKA. Current ABI:            N/A Comparison Study: No prior study on file Performing Technologist: Sharion Dove RVS  Examination Guidelines: A complete evaluation includes B-mode imaging, spectral Doppler, color Doppler, and power Doppler as needed of all accessible portions of each vessel. Bilateral testing is considered an integral part of a complete examination. Limited examinations for reoccurring indications may be performed as noted.   +--------+--------+-----+--------+--------+--------+ LEFT    PSV cm/sRatioStenosisWaveformComments +--------+--------+-----+--------+--------+--------+ CFA [id number]                                    +--------+--------+-----+--------+--------+--------+  Summary: Left: SFA and popliteal stents appear occluded. No Dopplerable flow in the posterior tibial, peroneal, or anterior tibial arteries.  See table(s) above for measurements and observations. Electronically signed by Harold Barban MD on 10/31/2020 at 2:28:03 PM.    Final    HYBRID OR IMAGING ([place])  Result Date: 10/28/2020 There is no interpretation for this exam.  This order is for images obtained during a surgical procedure.  Please See "Surgeries" Tab for more information regarding the procedure.    Microbiology Recent Results (from the past 240 hour(s))  Resp Panel by RT-PCR (Flu A&B, Covid) Nasopharyngeal Swab     Status: None   Collection Time: 10/29/20  8:27 AM   Specimen: Nasopharyngeal Swab; Nasopharyngeal(NP) swabs in vial transport medium  Result Value Ref Range Status   SARS Coronavirus 2 by RT PCR NEGATIVE NEGATIVE Final    Comment: (NOTE) SARS-CoV-2 target nucleic acids are NOT DETECTED.  The SARS-CoV-2 RNA is generally detectable in upper respiratory specimens during the acute phase of infection. The lowest concentration of SARS-CoV-2 viral copies this assay can detect is 138 copies/mL. A negative result does not preclude  SARS-Cov-2 infection and should not be used as the sole basis for treatment or other patient management decisions. A negative result may occur with  improper specimen collection/handling, submission of specimen other than nasopharyngeal swab, presence of viral mutation(s) within the areas targeted by this assay, and inadequate number of viral copies(<138 copies/mL). A negative result must be combined with clinical observations, patient history, and epidemiological information. The expected result is Negative.  Fact Sheet for Patients:  EntrepreneurPulse.com.au  Fact Sheet for Healthcare Providers:  IncredibleEmployment.be  This test is no t yet approved or cleared by the [place] FDA and  has been authorized for detection and/or diagnosis of SARS-CoV-2 by FDA under an Emergency Use Authorization (EUA). This EUA will remain  in effect (meaning this test can be used) for the duration of the COVID-19 declaration under Section 564(b)(1) of the Act, 21 U.S.C.section 360bbb-3(b)(1), unless the authorization is terminated  or revoked sooner.       Influenza A by PCR NEGATIVE NEGATIVE Final   Influenza B by PCR NEGATIVE NEGATIVE Final    Comment: (NOTE) The Xpert Xpress SARS-CoV-2/FLU/RSV plus assay is intended as an aid in the diagnosis of influenza from Nasopharyngeal swab specimens and should not be used as a sole basis for  treatment. Nasal washings and aspirates are unacceptable for Xpert Xpress SARS-CoV-2/FLU/RSV testing.  Fact Sheet for Patients: EntrepreneurPulse.com.au  Fact Sheet for Healthcare Providers: IncredibleEmployment.be  This test is not yet approved or cleared by the [place] FDA and has been authorized for detection and/or diagnosis of SARS-CoV-2 by FDA under an Emergency Use Authorization (EUA). This EUA will remain in effect (meaning this test can be used) for the duration of  the COVID-19 declaration under Section 564(b)(1) of the Act, 21 U.S.C. section 360bbb-3(b)(1), unless the authorization is terminated or revoked.  Performed at [facility], [place] [address], [place], [place] [address]   Resp Panel by RT-PCR (Flu A&B, Covid) Nasopharyngeal Swab     Status: None   Collection Time: 11/27/2020 11:22 AM   Specimen: Nasopharyngeal Swab; Nasopharyngeal(NP) swabs in vial transport medium  Result Value Ref Range Status   SARS Coronavirus 2 by RT PCR NEGATIVE NEGATIVE Final    Comment: (NOTE) SARS-CoV-2 target nucleic acids are NOT DETECTED.  The SARS-CoV-2 RNA is generally detectable in upper respiratory specimens during the acute phase of infection. The lowest concentration of SARS-CoV-2 viral copies this assay can detect is 138 copies/mL. A negative result does not preclude SARS-Cov-2 infection and should not be used as the sole basis for treatment or other patient management decisions. A negative result may occur with  improper specimen collection/handling, submission of specimen other than nasopharyngeal swab, presence of viral mutation(s) within the areas targeted by this assay, and inadequate number of viral copies(<138 copies/mL). A negative result must be combined with clinical observations, patient history, and epidemiological information. The expected result is Negative.  Fact Sheet for Patients:  EntrepreneurPulse.com.au  Fact Sheet for Healthcare Providers:  IncredibleEmployment.be  This test is no t yet approved or cleared by the [place] FDA and  has been authorized for detection and/or diagnosis of SARS-CoV-2 by FDA under an Emergency Use Authorization (EUA). This EUA will remain  in effect (meaning this test can be used) for the duration of the COVID-19 declaration under Section 564(b)(1) of the Act, 21 U.S.C.section 360bbb-3(b)(1), unless the authorization is terminated  or revoked sooner.        Influenza A by PCR NEGATIVE NEGATIVE Final   Influenza B by PCR NEGATIVE NEGATIVE Final    Comment: (NOTE) The Xpert Xpress SARS-CoV-2/FLU/RSV plus assay is intended as an aid in the diagnosis of influenza from Nasopharyngeal swab specimens and should not be used as a sole basis for treatment. Nasal washings and aspirates are unacceptable for Xpert Xpress SARS-CoV-2/FLU/RSV testing.  Fact Sheet for Patients: EntrepreneurPulse.com.au  Fact Sheet for Healthcare Providers: IncredibleEmployment.be  This test is not yet approved or cleared by the [place] FDA and has been authorized for detection and/or diagnosis of SARS-CoV-2 by FDA under an Emergency Use Authorization (EUA). This EUA will remain in effect (meaning this test can be used) for the duration of the COVID-19 declaration under Section 564(b)(1) of the Act, 21 U.S.C. section 360bbb-3(b)(1), unless the authorization is terminated or revoked.  Performed at [facility], [place] [address] [place], [place] [address]   MRSA Next Gen by PCR, Nasal     Status: None   Collection Time: 11/22/2020  6:54 PM   Specimen: Nasal Mucosa; Nasal Swab  Result Value Ref Range Status   MRSA by PCR Next Gen NOT DETECTED NOT DETECTED Final    Comment: (NOTE) The GeneXpert MRSA Assay (FDA approved for NASAL specimens only), is one component of a  comprehensive MRSA colonization surveillance program. It is not intended to diagnose MRSA infection nor to guide or monitor treatment for MRSA infections. Test performance is not FDA approved in patients less than 51 years old. Performed at [facility], [place] [address] [place], [place] [address]   Culture, blood (routine x 2)     Status: None (Preliminary result)   Collection Time: 11/02/20 12:07 AM   Specimen: BLOOD  Result Value Ref Range Status   Specimen Description BLOOD SITE NOT SPECIFIED  Final   Special Requests   Final    BOTTLES  DRAWN AEROBIC AND ANAEROBIC Blood Culture adequate volume   Culture   Final    NO GROWTH 2 DAYS Performed at [facility], [address], [place], [place] [address]    Report Status PENDING  Incomplete  Culture, blood (routine x 2)     Status: None (Preliminary result)   Collection Time: 11/02/20 12:07 AM   Specimen: BLOOD  Result Value Ref Range Status   Specimen Description BLOOD SITE NOT SPECIFIED  Final   Special Requests   Final    BOTTLES DRAWN AEROBIC AND ANAEROBIC Blood Culture results may not be optimal due to an inadequate volume of blood received in culture bottles   Culture   Final    NO GROWTH 2 DAYS Performed at [facility], [place] [address] [place], [place] [address]    Report Status PENDING  Incomplete    Lab Basic Metabolic Panel: Recent Labs  Lab 10/29/20 0530 11/27/2020 1130 11/21/2020 1537 11/27/2020 1700 11/02/20 0007 2020-11-07 0317 11-07-2020 0927 07-Nov-2020 1153 07-Nov-2020 1519  NA 136 134*   < > 134* 135 130*  --  127* 130*  K 4.3 3.6   < > 4.3 4.0 4.1  --  4.3 3.7  CL 95* 92*  --  96* 93* 91*  --   --   --   CO2 26 26  --  13* 19* 23  --   --   --   GLUCOSE 222* 135*  --  54* 170* 126*  --   --   --   BUN 51* 16  --  21 25* 36*  --   --   --   CREATININE 8.36* 3.28*  --  3.91* 4.31* 5.43*  --   --   --   CALCIUM 8.5* 8.1*  --  8.0* 8.2* 7.4*  --   --   --   MG  --   --   --   --  2.2  --   --   --   --   PHOS  --   --   --   --  7.2*  --  6.3*  --   --    < > = values in this interval not displayed.   Liver Function Tests: Recent Labs  Lab 11/02/20 0007 11-07-2020 0927  AST 3,226* 4,722*  ALT 1,193* 1,665*  ALKPHOS 540* 525*  BILITOT 3.4* 3.3*  PROT 5.7* 5.5*  ALBUMIN 2.8* 2.5*   No results for input(s): LIPASE, AMYLASE in the last 168 hours. No results for input(s): AMMONIA in the last 168 hours. CBC: Recent Labs  Lab 10/29/20 0530 10/29/20 0848 11/18/2020 1130 11/08/2020 1537 11/17/2020 1638 11/02/20 0007 11/07/2020 0317  11-07-20 0932 11/07/20 1153 11/07/20 1519  WBC 12.3* 13.3* 11.5*  --   --  15.7* 14.7*  --   --   --   NEUTROABS  9.9*  --  8.9*  --   --   --   --   --   --   --   HGB 8.7* 9.1* 8.5*   < > 9.9* 7.8* 8.6*  --  10.2* 9.9*  HCT 28.2* 30.1* 26.7*   < > 29.0* 25.3* 27.8*  --  30.0* 29.0*  MCV 76.8* 78.4* 75.6*  --   --  77.8* 77.4*  --   --   --   PLT 177 173 181  --   --  173 174 178  --   --    < > = values in this interval not displayed.   Cardiac Enzymes: No results for input(s): CKTOTAL, CKMB, CKMBINDEX, TROPONINI in the last 168 hours. Sepsis Labs: Recent Labs  Lab 10/29/20 0848 11/12/2020 1130 10/31/2020 2014 11/02/20 0007 11/02/20 0629 2020-11-25 0317 11/25/20 0941  WBC 13.3* 11.5*  --  15.7*  --  14.7*  --   LATICACIDVEN  --   --  >11.0* 10.0* 8.3*  --  4.1*    Procedures/Operations  ETT  CVL HD cath Arterial line   Roselie Awkward 11/04/2020, 12:47 PM

## 2020-11-29 NOTE — Progress Notes (Signed)
[facility]  I spoke to his wife and let her know that [place] is not an operative candidate. Given this, he will not survive this illness as his leg will continue to necrose and he will be come more septic.  His family voiced understanding and agreed that he needs to be DNR and we will need to proceed with comfort measures.  Will write an order for DNR, continue current management until his family can come to visit.  After their visit will proceed with comfort measures.  Roselie Awkward, MD [place] [facility] Pager: [phone] Cell: [phone] If no response, please call [phone] until 7pm After 7:00 pm call Elink  [phone]

## 2020-11-29 NOTE — Progress Notes (Addendum)
Nephrology Follow-Up Consult note   Assessment/Recommendations: Gary [name] is a/an 65 y.o. male with a past medical history significant for PAD, R BKA, HFrEF, HTN, HLD, COPD, ESRD admitted for ischemic left leg now w/ shock.       PVD/LLE Ischemia: attempted intervention on 7/4 complicated by shock with induction of anesthesia.  Vascular surgery following.  Antibiotics and management per vascular.  Limited options given his significant illness.  May benefit from palliative care.  ESRD: HD MWF.  Full treatment on 7/4.  Continues to be on norepinephrine.  Appears somewhat volume overloaded today but no other acute need for dialysis.  Likely will plan to hold off on dialysis today unless an urgent concern develops.  If his shock does not improve may need to consider dialysis catheter placement and CRRT.  Shock: Concerning for cardiogenic but also possible septic component.  Continue norepinephrine and management per primary team.  Wean as tolerated  Metabolic acidosis: Resolved on labs today  Hyponatremia: Sodium 130.  Likely related to free water retention in setting of volume overload.  Management of dialysis as above  HFrEF: distended neck veins but not severely overloaded today. HD mgmt as above. Cardiology following  DM2: mgmt per primary  Anemia of CKD: recently received ESA. Hgb improved to 8.6 today.stopping heparin today. CTM closely and transfuse if Hgb <7  Secondary hyperparathyroidism: Continue VDRA and binders  Recommendations conveyed to primary service.    [facility] Nov 18, 2020 9:21 AM  ADDENDUM:  Discussed case w/ Dr. Lake Bells. We will try hemodialysis on levophed today given he is overloaded and risks of doing an HD cath are fairly high right now but also trying to buy time to better determine his GOC. Will try to do HD later today/tonight pending nursing availability.  ADDENDUM: Notified by primary team that the patient had a cardiac  arrest.  Notified by primary team that the patient had cardiac arrest now achieved ROSC.  Dialysis catheter being placed and the patient will start CRRT.  ___________________________________________________________  CC: ESRD  Interval History/Subjective: Patient states he feels fairly well.  He does admit to some shortness of breath.  Denies any chest pain.   Medications:  Current Facility-Administered Medications  Medication Dose Route Frequency Provider Last Rate Last Admin   0.9 %  sodium chloride infusion  250 mL Intravenous Continuous Ollis, Brandi L, NP       acetaminophen (TYLENOL) tablet 1,000 mg  1,000 mg Oral TID PRN Wynetta Fines T, MD       albuterol (PROVENTIL) (2.5 MG/3ML) 0.083% nebulizer solution 2.5 mg  2.5 mg Inhalation Q6H PRN Wynetta Fines T, MD       aspirin chewable tablet 81 mg  81 mg Oral Daily Noemi Chapel P, DO   81 mg at 11/02/20 7619   bisacodyl (DULCOLAX) EC tablet 5 mg  5 mg Oral Daily Wynetta Fines T, MD       cefTAZidime (FORTAZ) 1 g in sodium chloride 0.9 % 100 mL IVPB  1 g Intravenous Q24H Simonne Maffucci B, MD       Chlorhexidine Gluconate Cloth 2 % PADS 6 each  6 each Topical Daily Julian Hy, DO   6 each at 11/02/20 1511   clopidogrel (PLAVIX) tablet 75 mg  75 mg Oral Daily Noemi Chapel P, DO   75 mg at 11/02/20 5093   docusate sodium (COLACE) capsule 100 mg  100 mg Oral BID PRN Julian Hy, DO  famotidine (PEPCID) tablet 20 mg  20 mg Oral QHS Wynetta Fines T, MD   20 mg at 11/02/20 2112   feeding supplement (NEPRO CARB STEADY) liquid 240 mL  240 mL Oral Daily Wynetta Fines T, MD   240 mL at 11/02/20 0928   heparin ADULT infusion 100 units/mL (25000 units/240mL)  1,600 Units/hr Intravenous Continuous Simonne Maffucci B, MD 16 mL/hr at November 13, 2020 0611 1,600 Units/hr at November 13, 2020 0611   HYDROcodone-acetaminophen (NORCO/VICODIN) 5-325 MG per tablet 1 tablet  1 tablet Oral Q4H PRN June Leap L, DO   1 tablet at 11/02/20 0802   insulin aspart (novoLOG)  injection 1-3 Units  1-3 Units Subcutaneous Q4H Julian Hy, DO   1 Units at 11-13-2020 3500   MEDLINE mouth rinse  15 mL Mouth Rinse BID Julian Hy, DO   15 mL at 11/02/20 2112   metoCLOPramide (REGLAN) tablet 5 mg  5 mg Oral BID PRN Wynetta Fines T, MD   5 mg at 2020-11-13 9381   multivitamin (RENA-VIT) tablet 1 tablet  1 tablet Oral QHS Wynetta Fines T, MD   1 tablet at 11/02/20 2112   norepinephrine (LEVOPHED) 4mg  in 239mL premix infusion  0-40 mcg/min Intravenous Titrated Julian Hy, DO 33.8 mL/hr at 2020/11/13 8299 9 mcg/min at 11/13/2020 3716   phenylephrine (NEOSYNEPHRINE) 10-0.9 MG/250ML-% infusion  25-200 mcg/min Intravenous Titrated Donita Brooks, NP   Stopped at 11/10/2020 1859   phenylephrine-shark liver oil-mineral oil-petrolatum (PREPARATION H) rectal ointment 1 application  1 application Rectal BID PRN Noemi Chapel P, DO       phytonadione (VITAMIN K) tablet 10 mg  10 mg Oral Once Ollis, Brandi L, NP       polyethylene glycol (MIRALAX / GLYCOLAX) packet 17 g  17 g Oral Daily Zhang, Pearletha Forge T, MD       polyethylene glycol (MIRALAX / GLYCOLAX) packet 17 g  17 g Oral Daily PRN Noemi Chapel P, DO       senna-docusate (Senokot-S) tablet 2 tablet  2 tablet Oral QHS Lequita Halt, MD   2 tablet at 11/02/20 2112   umeclidinium-vilanterol (ANORO ELLIPTA) 62.5-25 MCG/INH 1 puff  1 puff Inhalation Daily Wynetta Fines T, MD   1 puff at 2020/11/13 0755   [START ON 11/07/2020] Vitamin D (Ergocalciferol) (DRISDOL) capsule 50,000 Units  50,000 Units Oral Q30 days Lequita Halt, MD          Review of Systems: 10 systems reviewed and negative except per interval history/subjective  Physical Exam: Vitals:   2020/11/13 0705 13-Nov-2020 0756  BP:    Pulse: 96   Resp: 15   Temp: 97.8 F (36.6 C)   SpO2: (!) 76% 99%   No intake/output data recorded.  Intake/Output Summary (Last 24 hours) at 11/13/2020 9678 Last data filed at 11-13-2020 0500 Gross per 24 hour  Intake 867.58 ml  Output --  Net 867.58 ml    Constitutional: Chronically ill-appearing, lying in bed, no distress ENMT: ears and nose without scars or lesions, MMM CV: normal rate, trace edema in lle Respiratory: Bilateral chest rise, mild increased work of breathing, clear to auscultation anteriorly Gastrointestinal: soft, non-tender, no palpable masses or hernias Skin: no visible lesions or rashes Extremities: Right BKA, left lower extremity cool Psych: alert, judgement/insight appropriate, appropriate mood and affect   Test Results I personally reviewed new and old clinical labs and radiology tests Lab Results  Component Value Date   NA 130 (L) 11-13-2020   K  4.1 11-10-20   CL 91 (L) 2020-11-10   CO2 23 11/10/2020   BUN 36 (H) 2020/11/10   CREATININE 5.43 (H) 10-Nov-2020   CALCIUM 7.4 (L) 11-10-2020   ALBUMIN 2.8 (L) 11/02/2020   PHOS 7.2 (H) 11/02/2020      Recent Results (from the past 2160 hour(s))  Basic metabolic panel     Status: Abnormal   Collection Time: 08/17/20  1:41 PM  Result Value Ref Range   Sodium 137 135 - 145 mmol/L   Potassium 4.3 3.5 - 5.1 mmol/L   Chloride 92 (L) 98 - 111 mmol/L   CO2 26 22 - 32 mmol/L   Glucose, Bld 219 (H) 70 - 99 mg/dL    Comment: Glucose reference range applies only to samples taken after fasting for at least 8 hours.   BUN 59 (H) 8 - 23 mg/dL   Creatinine, Ser 11.89 (H) 0.61 - 1.24 mg/dL   Calcium 9.1 8.9 - 10.3 mg/dL   GFR, Estimated 4 (L) >60 mL/min    Comment: (NOTE) Calculated using the CKD-EPI Creatinine Equation (2021)    Anion gap 19 (H) 5 - 15    Comment: Performed at [facility] [address] [place], [place] [address]  CBC with Differential     Status: Abnormal   Collection Time: 08/17/20  1:41 PM  Result Value Ref Range   WBC 13.2 (H) 4.0 - 10.5 K/uL   RBC 5.05 4.22 - 5.81 MIL/uL   Hemoglobin 13.6 13.0 - 17.0 g/dL   HCT 42.0 39.0 - 52.0 %   MCV 83.2 80.0 - 100.0 fL   MCH 26.9 26.0 - 34.0 pg   MCHC 32.4 30.0 - 36.0 g/dL   RDW 13.8 11.5  - 15.5 %   Platelets 246 150 - 400 K/uL   nRBC 0.0 0.0 - 0.2 %   Neutrophils Relative % 82 %   Neutro Abs 10.8 (H) 1.7 - 7.7 K/uL   Lymphocytes Relative 11 %   Lymphs Abs 1.4 0.7 - 4.0 K/uL   Monocytes Relative 6 %   Monocytes Absolute 0.8 0.1 - 1.0 K/uL   Eosinophils Relative 1 %   Eosinophils Absolute 0.1 0.0 - 0.5 K/uL   Basophils Relative 0 %   Basophils Absolute 0.0 0.0 - 0.1 K/uL   Immature Granulocytes 0 %   Abs Immature Granulocytes 0.04 0.00 - 0.07 K/uL    Comment: Performed at [facility] [address] [place], [place] [address]  Brain natriuretic peptide     Status: Abnormal   Collection Time: 08/17/20  1:41 PM  Result Value Ref Range   B Natriuretic Peptide >4,500.0 (H) 0.0 - 100.0 pg/mL    Comment: Performed at [facility] [address], [place], [place] [address]  Troponin I (High Sensitivity)     Status: Abnormal   Collection Time: 08/17/20  1:41 PM  Result Value Ref Range   Troponin I (High Sensitivity) 256 (HH) <18 ng/L    Comment: CRITICAL RESULT CALLED TO, READ BACK BY AND VERIFIED WITH: Irena Reichmann RN [phone] K FORSYTH (NOTE) Elevated high sensitivity troponin I (hsTnI) values and significant  changes across serial measurements may suggest ACS but many other  chronic and acute conditions are known to elevate hsTnI results.  Refer to the Links section for chest pain algorithms and additional  guidance. Performed at [facility], [place] [address] [place], [place] [address]   Resp Panel by RT-PCR (  Flu A&B, Covid) Nasopharyngeal Swab     Status: None   Collection Time: 08/17/20  2:04 PM   Specimen: Nasopharyngeal Swab; Nasopharyngeal(NP) swabs in vial transport medium  Result Value Ref Range   SARS Coronavirus 2 by RT PCR NEGATIVE NEGATIVE    Comment: (NOTE) SARS-CoV-2 target nucleic acids are NOT DETECTED.  The SARS-CoV-2 RNA is generally detectable in upper respiratory specimens during the acute phase of infection. The  lowest concentration of SARS-CoV-2 viral copies this assay can detect is 138 copies/mL. A negative result does not preclude SARS-Cov-2 infection and should not be used as the sole basis for treatment or other patient management decisions. A negative result may occur with  improper specimen collection/handling, submission of specimen other than nasopharyngeal swab, presence of viral mutation(s) within the areas targeted by this assay, and inadequate number of viral copies(<138 copies/mL). A negative result must be combined with clinical observations, patient history, and epidemiological information. The expected result is Negative.  Fact Sheet for Patients:  EntrepreneurPulse.com.au  Fact Sheet for Healthcare Providers:  IncredibleEmployment.be  This test is no t yet approved or cleared by the [place] FDA and  has been authorized for detection and/or diagnosis of SARS-CoV-2 by FDA under an Emergency Use Authorization (EUA). This EUA will remain  in effect (meaning this test can be used) for the duration of the COVID-19 declaration under Section 564(b)(1) of the Act, 21 U.S.C.section 360bbb-3(b)(1), unless the authorization is terminated  or revoked sooner.       Influenza A by PCR NEGATIVE NEGATIVE   Influenza B by PCR NEGATIVE NEGATIVE    Comment: (NOTE) The Xpert Xpress SARS-CoV-2/FLU/RSV plus assay is intended as an aid in the diagnosis of influenza from Nasopharyngeal swab specimens and should not be used as a sole basis for treatment. Nasal washings and aspirates are unacceptable for Xpert Xpress SARS-CoV-2/FLU/RSV testing.  Fact Sheet for Patients: EntrepreneurPulse.com.au  Fact Sheet for Healthcare Providers: IncredibleEmployment.be  This test is not yet approved or cleared by the [place] FDA and has been authorized for detection and/or diagnosis of SARS-CoV-2 by FDA under an Emergency  Use Authorization (EUA). This EUA will remain in effect (meaning this test can be used) for the duration of the COVID-19 declaration under Section 564(b)(1) of the Act, 21 U.S.C. section 360bbb-3(b)(1), unless the authorization is terminated or revoked.  Performed at [facility], [place] [address], [place], [place] [address]   Lactic acid, plasma     Status: Abnormal   Collection Time: 08/17/20  2:46 PM  Result Value Ref Range   Lactic Acid, Venous 2.7 (HH) 0.5 - 1.9 mmol/L    Comment: CRITICAL RESULT CALLED TO, READ BACK BY AND VERIFIED WITH: Irena Reichmann RN [phone] K FORSYTH Performed at [facility] [address], [place], [place] [address]   Troponin I (High Sensitivity)     Status: Abnormal   Collection Time: 08/17/20  5:40 PM  Result Value Ref Range   Troponin I (High Sensitivity) 262 (HH) <18 ng/L    Comment: CRITICAL VALUE NOTED.  VALUE IS CONSISTENT WITH PREVIOUSLY REPORTED AND CALLED VALUE. (NOTE) Elevated high sensitivity troponin I (hsTnI) values and significant  changes across serial measurements may suggest ACS but many other  chronic and acute conditions are known to elevate hsTnI results.  Refer to the Links section for chest pain algorithms and additional  guidance. Performed at [facility], [place] [address] [place],  [address]   Creatinine,  serum     Status: Abnormal   Collection Time: 08/17/20  5:40 PM  Result Value Ref Range   Creatinine, Ser 12.18 (H) 0.61 - 1.24 mg/dL   GFR, Estimated 4 (L) >60 mL/min    Comment: (NOTE) Calculated using the CKD-EPI Creatinine Equation (2021) Performed at [facility] [address], [place], [place] [address]   HIV Antibody (routine testing w rflx)     Status: None   Collection Time: 08/17/20  6:27 PM  Result Value Ref Range   HIV Screen 4th Generation wRfx Non Reactive Non Reactive    Comment: Performed at [facility], [place] [address], [place], [place] [address]  Hepatitis B  surface antibody     Status: Abnormal   Collection Time: 08/17/20  6:28 PM  Result Value Ref Range   Hepatitis B-Post 4.0 (L) Immunity>9.9 mIU/mL    Comment: (NOTE)  Status of Immunity                     Anti-HBs Level  ------------------                     -------------- Inconsistent with Immunity                   0.0 - 9.9 Consistent with Immunity                          >9.9 Performed At: [facility] [place], [place] [id number] Rush Farmer MD RX:[id number]   Hepatitis B surface antibody,qualitative     Status: None   Collection Time: 08/17/20  6:28 PM  Result Value Ref Range   Hep B S Ab NON REACTIVE NON REACTIVE    Comment: (NOTE) Inconsistent with immunity, less than 10 mIU/mL.  Performed at [facility], [place] [address], [place], [place] [address]   Hepatitis B surface antigen     Status: None   Collection Time: 08/17/20  6:28 PM  Result Value Ref Range   Hepatitis B Surface Ag NON REACTIVE NON REACTIVE    Comment: Performed at [facility] [address] [place], [place] [address]  Glucose, capillary     Status: Abnormal   Collection Time: 08/17/20 11:13 PM  Result Value Ref Range   Glucose-Capillary 200 (H) 70 - 99 mg/dL    Comment: Glucose reference range applies only to samples taken after fasting for at least 8 hours.  CBC     Status: Abnormal   Collection Time: 08/18/20 12:43 AM  Result Value Ref Range   WBC 12.2 (H) 4.0 - 10.5 K/uL   RBC 4.20 (L) 4.22 - 5.81 MIL/uL   Hemoglobin 11.1 (L) 13.0 - 17.0 g/dL   HCT 34.8 (L) 39.0 - 52.0 %   MCV 82.9 80.0 - 100.0 fL   MCH 26.4 26.0 - 34.0 pg   MCHC 31.9 30.0 - 36.0 g/dL   RDW 13.6 11.5 - 15.5 %   Platelets 196 150 - 400 K/uL   nRBC 0.0 0.0 - 0.2 %    Comment: Performed at [facility], [place] [address], [place], [place] [address]  Renal function panel     Status: Abnormal   Collection Time: 08/18/20 12:43 AM  Result Value Ref Range   Sodium 136 135 - 145 mmol/L    Potassium 3.3 (L) 3.5 - 5.1 mmol/L   Chloride 95 (  L) 98 - 111 mmol/L   CO2 28 22 - 32 mmol/L   Glucose, Bld 279 (H) 70 - 99 mg/dL    Comment: Glucose reference range applies only to samples taken after fasting for at least 8 hours.   BUN 28 (H) 8 - 23 mg/dL   Creatinine, Ser 7.33 (H) 0.61 - 1.24 mg/dL    Comment: DELTA CHECK NOTED DIALYSIS    Calcium 8.5 (L) 8.9 - 10.3 mg/dL   Phosphorus 5.1 (H) 2.5 - 4.6 mg/dL   Albumin 2.5 (L) 3.5 - 5.0 g/dL   GFR, Estimated 8 (L) >60 mL/min    Comment: (NOTE) Calculated using the CKD-EPI Creatinine Equation (2021)    Anion gap 13 5 - 15    Comment: Performed at [facility] [address], [place], [place] [address]  Lactic acid, plasma     Status: Abnormal   Collection Time: 08/18/20 12:43 AM  Result Value Ref Range   Lactic Acid, Venous 3.0 (HH) 0.5 - 1.9 mmol/L    Comment: CRITICAL VALUE NOTED.  VALUE IS CONSISTENT WITH PREVIOUSLY REPORTED AND CALLED VALUE. Performed at [facility], [place] [address], [place], [place] [address]   Lactic acid, plasma     Status: Abnormal   Collection Time: 08/18/20  2:35 AM  Result Value Ref Range   Lactic Acid, Venous 2.4 (HH) 0.5 - 1.9 mmol/L    Comment: CRITICAL VALUE NOTED.  VALUE IS CONSISTENT WITH PREVIOUSLY REPORTED AND CALLED VALUE. Performed at [facility], [place] [address] [place], [place] [address]   Troponin I (High Sensitivity)     Status: Abnormal   Collection Time: 08/18/20  2:55 AM  Result Value Ref Range   Troponin I (High Sensitivity) 374 (HH) <18 ng/L    Comment: CRITICAL VALUE NOTED.  VALUE IS CONSISTENT WITH PREVIOUSLY REPORTED AND CALLED VALUE. (NOTE) Elevated high sensitivity troponin I (hsTnI) values and significant  changes across serial measurements may suggest ACS but many other  chronic and acute conditions are known to elevate hsTnI results.  Refer to the Links section for chest pain algorithms and additional  guidance. Performed at [facility], [place] [address], [place], [place] [address]   Troponin I (High Sensitivity)     Status: Abnormal   Collection Time: 08/18/20  5:37 AM  Result Value Ref Range   Troponin I (High Sensitivity) 437 (HH) <18 ng/L    Comment: CRITICAL VALUE NOTED.  VALUE IS CONSISTENT WITH PREVIOUSLY REPORTED AND CALLED VALUE. (NOTE) Elevated high sensitivity troponin I (hsTnI) values and significant  changes across serial measurements may suggest ACS but many other  chronic and acute conditions are known to elevate hsTnI results.  Refer to the Links section for chest pain algorithms and additional  guidance. Performed at [facility], [place] [address], [place], [place] [address]   Glucose, capillary     Status: Abnormal   Collection Time: 08/18/20  7:19 AM  Result Value Ref Range   Glucose-Capillary 108 (H) 70 - 99 mg/dL    Comment: Glucose reference range applies only to samples taken after fasting for at least 8 hours.  Troponin I (High Sensitivity)     Status: Abnormal   Collection Time: 08/18/20  9:14 AM  Result Value Ref Range   Troponin I (High Sensitivity) 459 (HH) <18 ng/L    Comment: CRITICAL VALUE NOTED.  VALUE IS CONSISTENT WITH PREVIOUSLY REPORTED AND CALLED VALUE. (NOTE) Elevated high sensitivity  troponin I (hsTnI) values and significant  changes across serial measurements may suggest ACS but many other  chronic and acute conditions are known to elevate hsTnI results.  Refer to the Links section for chest pain algorithms and additional  guidance. Performed at [facility], [place] [address], [place], [place] [address]   Glucose, capillary     Status: Abnormal   Collection Time: 08/18/20 11:22 AM  Result Value Ref Range   Glucose-Capillary 184 (H) 70 - 99 mg/dL    Comment: Glucose reference range applies only to samples taken after fasting for at least 8 hours.  Troponin I (High Sensitivity)     Status: Abnormal   Collection Time: 08/18/20 11:30 AM  Result Value Ref Range    Troponin I (High Sensitivity) 388 (HH) <18 ng/L    Comment: CRITICAL VALUE NOTED.  VALUE IS CONSISTENT WITH PREVIOUSLY REPORTED AND CALLED VALUE. (NOTE) Elevated high sensitivity troponin I (hsTnI) values and significant  changes across serial measurements may suggest ACS but many other  chronic and acute conditions are known to elevate hsTnI results.  Refer to the Links section for chest pain algorithms and additional  guidance. Performed at [facility], [place] [address], [place], [place] [address]   Glucose, capillary     Status: Abnormal   Collection Time: 08/18/20  6:13 PM  Result Value Ref Range   Glucose-Capillary 181 (H) 70 - 99 mg/dL    Comment: Glucose reference range applies only to samples taken after fasting for at least 8 hours.  Glucose, capillary     Status: Abnormal   Collection Time: 08/19/20 12:14 AM  Result Value Ref Range   Glucose-Capillary 116 (H) 70 - 99 mg/dL    Comment: Glucose reference range applies only to samples taken after fasting for at least 8 hours.  Renal function panel     Status: Abnormal   Collection Time: 08/19/20  3:45 AM  Result Value Ref Range   Sodium 135 135 - 145 mmol/L   Potassium 3.9 3.5 - 5.1 mmol/L   Chloride 97 (L) 98 - 111 mmol/L   CO2 27 22 - 32 mmol/L   Glucose, Bld 102 (H) 70 - 99 mg/dL    Comment: Glucose reference range applies only to samples taken after fasting for at least 8 hours.   BUN 20 8 - 23 mg/dL   Creatinine, Ser 5.05 (H) 0.61 - 1.24 mg/dL   Calcium 8.9 8.9 - 10.3 mg/dL   Phosphorus 2.9 2.5 - 4.6 mg/dL   Albumin 2.6 (L) 3.5 - 5.0 g/dL   GFR, Estimated 12 (L) >60 mL/min    Comment: (NOTE) Calculated using the CKD-EPI Creatinine Equation (2021)    Anion gap 11 5 - 15    Comment: Performed at [facility] [address] [place], [place] [address]  Glucose, capillary     Status: Abnormal   Collection Time: 08/19/20  8:53 AM  Result Value Ref Range   Glucose-Capillary 63 (L) 70 - 99 mg/dL     Comment: Glucose reference range applies only to samples taken after fasting for at least 8 hours.  Glucose, capillary     Status: None   Collection Time: 08/19/20  9:45 AM  Result Value Ref Range   Glucose-Capillary 94 70 - 99 mg/dL    Comment: Glucose reference range applies only to samples taken after fasting for at least 8 hours.  Glucose, capillary     Status: None  Collection Time: 08/19/20  1:14 PM  Result Value Ref Range   Glucose-Capillary 91 70 - 99 mg/dL    Comment: Glucose reference range applies only to samples taken after fasting for at least 8 hours.  Glucose, capillary     Status: Abnormal   Collection Time: 08/19/20  6:27 PM  Result Value Ref Range   Glucose-Capillary 184 (H) 70 - 99 mg/dL    Comment: Glucose reference range applies only to samples taken after fasting for at least 8 hours.  Glucose, capillary     Status: Abnormal   Collection Time: 08/19/20  8:48 PM  Result Value Ref Range   Glucose-Capillary 167 (H) 70 - 99 mg/dL    Comment: Glucose reference range applies only to samples taken after fasting for at least 8 hours.  Renal function panel     Status: Abnormal   Collection Time: 08/20/20  2:34 AM  Result Value Ref Range   Sodium 136 135 - 145 mmol/L   Potassium 4.1 3.5 - 5.1 mmol/L   Chloride 95 (L) 98 - 111 mmol/L   CO2 29 22 - 32 mmol/L   Glucose, Bld 113 (H) 70 - 99 mg/dL    Comment: Glucose reference range applies only to samples taken after fasting for at least 8 hours.   BUN 35 (H) 8 - 23 mg/dL   Creatinine, Ser 7.24 (H) 0.61 - 1.24 mg/dL   Calcium 9.3 8.9 - 10.3 mg/dL   Phosphorus 3.6 2.5 - 4.6 mg/dL   Albumin 2.6 (L) 3.5 - 5.0 g/dL   GFR, Estimated 8 (L) >60 mL/min    Comment: (NOTE) Calculated using the CKD-EPI Creatinine Equation (2021)    Anion gap 12 5 - 15    Comment: Performed at [facility] [address], [place], [place] [address]  Glucose, capillary     Status: Abnormal   Collection Time: 08/20/20  6:50 AM  Result  Value Ref Range   Glucose-Capillary 112 (H) 70 - 99 mg/dL    Comment: Glucose reference range applies only to samples taken after fasting for at least 8 hours.  CBC     Status: Abnormal   Collection Time: 08/20/20  8:40 AM  Result Value Ref Range   WBC 13.2 (H) 4.0 - 10.5 K/uL   RBC 4.26 4.22 - 5.81 MIL/uL   Hemoglobin 11.2 (L) 13.0 - 17.0 g/dL   HCT 35.6 (L) 39.0 - 52.0 %   MCV 83.6 80.0 - 100.0 fL   MCH 26.3 26.0 - 34.0 pg   MCHC 31.5 30.0 - 36.0 g/dL   RDW 13.9 11.5 - 15.5 %   Platelets 276 150 - 400 K/uL   nRBC 0.0 0.0 - 0.2 %    Comment: Performed at [facility], [place] [address], [place], [place] [address]  Glucose, capillary     Status: Abnormal   Collection Time: 08/20/20 12:46 PM  Result Value Ref Range   Glucose-Capillary 203 (H) 70 - 99 mg/dL    Comment: Glucose reference range applies only to samples taken after fasting for at least 8 hours.  Glucose, capillary     Status: Abnormal   Collection Time: 08/20/20  6:13 PM  Result Value Ref Range   Glucose-Capillary 308 (H) 70 - 99 mg/dL    Comment: Glucose reference range applies only to samples taken after fasting for at least 8 hours.  Glucose, capillary     Status: Abnormal   Collection Time: 08/20/20 10:29 PM  Result Value Ref Range   Glucose-Capillary 101 (H) 70 - 99 mg/dL    Comment: Glucose reference range applies only to samples taken after fasting for at least 8 hours.  Culture, blood (routine x 2)     Status: None   Collection Time: 08/21/20 12:45 AM   Specimen: BLOOD  Result Value Ref Range   Specimen Description BLOOD RIGHT ANTECUBITAL    Special Requests      BOTTLES DRAWN AEROBIC AND ANAEROBIC Blood Culture adequate volume   Culture      NO GROWTH 5 DAYS Performed at [facility], [place] [address] [place], [place] [address]    Report Status 08/26/2020 FINAL   Renal function panel     Status: Abnormal   Collection Time: 08/21/20 12:48 AM  Result Value Ref Range   Sodium 133 (L) 135 - 145  mmol/L   Potassium 4.0 3.5 - 5.1 mmol/L   Chloride 92 (L) 98 - 111 mmol/L   CO2 28 22 - 32 mmol/L   Glucose, Bld 171 (H) 70 - 99 mg/dL    Comment: Glucose reference range applies only to samples taken after fasting for at least 8 hours.   BUN 24 (H) 8 - 23 mg/dL   Creatinine, Ser 5.16 (H) 0.61 - 1.24 mg/dL   Calcium 9.3 8.9 - 10.3 mg/dL   Phosphorus 3.0 2.5 - 4.6 mg/dL   Albumin 2.7 (L) 3.5 - 5.0 g/dL   GFR, Estimated 12 (L) >60 mL/min    Comment: (NOTE) Calculated using the CKD-EPI Creatinine Equation (2021)    Anion gap 13 5 - 15    Comment: Performed at [facility] [address] [place], [place] [address]  CBC with Differential/Platelet     Status: Abnormal   Collection Time: 08/21/20 12:48 AM  Result Value Ref Range   WBC 14.3 (H) 4.0 - 10.5 K/uL   RBC 4.64 4.22 - 5.81 MIL/uL   Hemoglobin 12.3 (L) 13.0 - 17.0 g/dL   HCT 39.2 39.0 - 52.0 %   MCV 84.5 80.0 - 100.0 fL   MCH 26.5 26.0 - 34.0 pg   MCHC 31.4 30.0 - 36.0 g/dL   RDW 13.9 11.5 - 15.5 %   Platelets 279 150 - 400 K/uL   nRBC 0.0 0.0 - 0.2 %   Neutrophils Relative % 83 %   Neutro Abs 11.9 (H) 1.7 - 7.7 K/uL   Lymphocytes Relative 7 %   Lymphs Abs 1.0 0.7 - 4.0 K/uL   Monocytes Relative 8 %   Monocytes Absolute 1.2 (H) 0.1 - 1.0 K/uL   Eosinophils Relative 1 %   Eosinophils Absolute 0.1 0.0 - 0.5 K/uL   Basophils Relative 0 %   Basophils Absolute 0.0 0.0 - 0.1 K/uL   Immature Granulocytes 1 %   Abs Immature Granulocytes 0.08 (H) 0.00 - 0.07 K/uL    Comment: Performed at [facility] [address], [place], [place] [address]  Culture, blood (routine x 2)     Status: None   Collection Time: 08/21/20 12:55 AM   Specimen: BLOOD RIGHT WRIST  Result Value Ref Range   Specimen Description BLOOD RIGHT WRIST    Special Requests      BOTTLES DRAWN AEROBIC AND ANAEROBIC Blood Culture adequate volume   Culture      NO GROWTH 5 DAYS Performed at [facility], [place] [address], [place], [place]  [address]    Report Status 08/26/2020 FINAL  Lactic acid, plasma     Status: None   Collection Time: 08/21/20  4:42 AM  Result Value Ref Range   Lactic Acid, Venous 1.6 0.5 - 1.9 mmol/L    Comment: Performed at [facility] [address], [place], [place] [address]  Glucose, capillary     Status: Abnormal   Collection Time: 08/21/20  6:42 AM  Result Value Ref Range   Glucose-Capillary 207 (H) 70 - 99 mg/dL    Comment: Glucose reference range applies only to samples taken after fasting for at least 8 hours.  Lactic acid, plasma     Status: None   Collection Time: 08/21/20  7:13 AM  Result Value Ref Range   Lactic Acid, Venous 1.3 0.5 - 1.9 mmol/L    Comment: Performed at [facility] [address], [place], [place] [address]  Glucose, capillary     Status: Abnormal   Collection Time: 08/21/20 11:33 AM  Result Value Ref Range   Glucose-Capillary 169 (H) 70 - 99 mg/dL    Comment: Glucose reference range applies only to samples taken after fasting for at least 8 hours.  Glucose, capillary     Status: Abnormal   Collection Time: 08/21/20  4:15 PM  Result Value Ref Range   Glucose-Capillary 190 (H) 70 - 99 mg/dL    Comment: Glucose reference range applies only to samples taken after fasting for at least 8 hours.  Glucose, capillary     Status: Abnormal   Collection Time: 08/21/20  8:44 PM  Result Value Ref Range   Glucose-Capillary 211 (H) 70 - 99 mg/dL    Comment: Glucose reference range applies only to samples taken after fasting for at least 8 hours.  Renal function panel     Status: Abnormal   Collection Time: 08/22/20  3:20 AM  Result Value Ref Range   Sodium 132 (L) 135 - 145 mmol/L   Potassium 4.0 3.5 - 5.1 mmol/L   Chloride 96 (L) 98 - 111 mmol/L   CO2 26 22 - 32 mmol/L   Glucose, Bld 247 (H) 70 - 99 mg/dL    Comment: Glucose reference range applies only to samples taken after fasting for at least 8 hours.   BUN 44 (H) 8 - 23 mg/dL   Creatinine, Ser 6.79 (H)  0.61 - 1.24 mg/dL   Calcium 9.3 8.9 - 10.3 mg/dL   Phosphorus 3.7 2.5 - 4.6 mg/dL   Albumin 2.3 (L) 3.5 - 5.0 g/dL   GFR, Estimated 8 (L) >60 mL/min    Comment: (NOTE) Calculated using the CKD-EPI Creatinine Equation (2021)    Anion gap 10 5 - 15    Comment: Performed at [facility] [address], [place], [place] [address]  Glucose, capillary     Status: Abnormal   Collection Time: 08/22/20  6:38 AM  Result Value Ref Range   Glucose-Capillary 163 (H) 70 - 99 mg/dL    Comment: Glucose reference range applies only to samples taken after fasting for at least 8 hours.  Glucose, capillary     Status: Abnormal   Collection Time: 08/22/20 11:39 AM  Result Value Ref Range   Glucose-Capillary 221 (H) 70 - 99 mg/dL    Comment: Glucose reference range applies only to samples taken after fasting for at least 8 hours.  Glucose, capillary     Status: Abnormal   Collection Time: 08/22/20  5:22 PM  Result Value Ref Range   Glucose-Capillary 185 (  H) 70 - 99 mg/dL    Comment: Glucose reference range applies only to samples taken after fasting for at least 8 hours.  Glucose, capillary     Status: Abnormal   Collection Time: 08/22/20  9:55 PM  Result Value Ref Range   Glucose-Capillary 242 (H) 70 - 99 mg/dL    Comment: Glucose reference range applies only to samples taken after fasting for at least 8 hours.  Renal function panel     Status: Abnormal   Collection Time: 08/23/20  3:56 AM  Result Value Ref Range   Sodium 135 135 - 145 mmol/L   Potassium 4.2 3.5 - 5.1 mmol/L   Chloride 96 (L) 98 - 111 mmol/L   CO2 26 22 - 32 mmol/L   Glucose, Bld 185 (H) 70 - 99 mg/dL    Comment: Glucose reference range applies only to samples taken after fasting for at least 8 hours.   BUN 61 (H) 8 - 23 mg/dL   Creatinine, Ser 8.85 (H) 0.61 - 1.24 mg/dL   Calcium 9.4 8.9 - 10.3 mg/dL   Phosphorus 4.2 2.5 - 4.6 mg/dL   Albumin 2.3 (L) 3.5 - 5.0 g/dL   GFR, Estimated 6 (L) >60 mL/min    Comment:  (NOTE) Calculated using the CKD-EPI Creatinine Equation (2021)    Anion gap 13 5 - 15    Comment: Performed at [facility] [address], [place], [place] [address]  Glucose, capillary     Status: Abnormal   Collection Time: 08/23/20  6:34 AM  Result Value Ref Range   Glucose-Capillary 139 (H) 70 - 99 mg/dL    Comment: Glucose reference range applies only to samples taken after fasting for at least 8 hours.  CBC     Status: Abnormal   Collection Time: 08/23/20  9:03 AM  Result Value Ref Range   WBC 8.9 4.0 - 10.5 K/uL   RBC 3.76 (L) 4.22 - 5.81 MIL/uL   Hemoglobin 10.0 (L) 13.0 - 17.0 g/dL   HCT 31.3 (L) 39.0 - 52.0 %   MCV 83.2 80.0 - 100.0 fL   MCH 26.6 26.0 - 34.0 pg   MCHC 31.9 30.0 - 36.0 g/dL   RDW 13.8 11.5 - 15.5 %   Platelets 251 150 - 400 K/uL   nRBC 0.0 0.0 - 0.2 %    Comment: Performed at [facility], [place] [address] [place], [place] [address]  Glucose, capillary     Status: Abnormal   Collection Time: 08/23/20  1:02 PM  Result Value Ref Range   Glucose-Capillary 178 (H) 70 - 99 mg/dL    Comment: Glucose reference range applies only to samples taken after fasting for at least 8 hours.  Glucose, capillary     Status: Abnormal   Collection Time: 08/23/20  4:17 PM  Result Value Ref Range   Glucose-Capillary 224 (H) 70 - 99 mg/dL    Comment: Glucose reference range applies only to samples taken after fasting for at least 8 hours.  Glucose, capillary     Status: Abnormal   Collection Time: 08/23/20 10:04 PM  Result Value Ref Range   Glucose-Capillary 197 (H) 70 - 99 mg/dL    Comment: Glucose reference range applies only to samples taken after fasting for at least 8 hours.  Renal function panel     Status: Abnormal   Collection Time: 08/24/20  5:49 AM  Result Value Ref Range   Sodium 131 (L) 135 -  145 mmol/L   Potassium 4.1 3.5 - 5.1 mmol/L   Chloride 94 (L) 98 - 111 mmol/L   CO2 27 22 - 32 mmol/L   Glucose, Bld 161 (H) 70 - 99 mg/dL    Comment:  Glucose reference range applies only to samples taken after fasting for at least 8 hours.   BUN 26 (H) 8 - 23 mg/dL   Creatinine, Ser 5.07 (H) 0.61 - 1.24 mg/dL    Comment: DELTA CHECK NOTED   Calcium 8.6 (L) 8.9 - 10.3 mg/dL   Phosphorus 3.7 2.5 - 4.6 mg/dL   Albumin 2.3 (L) 3.5 - 5.0 g/dL   GFR, Estimated 12 (L) >60 mL/min    Comment: (NOTE) Calculated using the CKD-EPI Creatinine Equation (2021)    Anion gap 10 5 - 15    Comment: Performed at [facility] [address], [place], [place] [address]  CBC     Status: Abnormal   Collection Time: 08/24/20  5:49 AM  Result Value Ref Range   WBC 8.9 4.0 - 10.5 K/uL   RBC 4.11 (L) 4.22 - 5.81 MIL/uL   Hemoglobin 10.8 (L) 13.0 - 17.0 g/dL   HCT 33.5 (L) 39.0 - 52.0 %   MCV 81.5 80.0 - 100.0 fL   MCH 26.3 26.0 - 34.0 pg   MCHC 32.2 30.0 - 36.0 g/dL   RDW 13.7 11.5 - 15.5 %   Platelets 256 150 - 400 K/uL   nRBC 0.0 0.0 - 0.2 %    Comment: Performed at [facility], [place] [address] [place], [place] [address]  Glucose, capillary     Status: Abnormal   Collection Time: 08/24/20  6:45 AM  Result Value Ref Range   Glucose-Capillary 164 (H) 70 - 99 mg/dL    Comment: Glucose reference range applies only to samples taken after fasting for at least 8 hours.  Glucose, capillary     Status: None   Collection Time: 08/24/20 11:36 AM  Result Value Ref Range   Glucose-Capillary 90 70 - 99 mg/dL    Comment: Glucose reference range applies only to samples taken after fasting for at least 8 hours.  Blood gas, arterial     Status: Abnormal   Collection Time: 09/28/20  1:26 PM  Result Value Ref Range   FIO2 28.00    pH, Arterial 7.444 7.350 - 7.450   pCO2 arterial 44.4 32.0 - 48.0 mmHg   pO2, Arterial 131 (H) 83.0 - 108.0 mmHg   Bicarbonate 29.9 (H) 20.0 - 28.0 mmol/L   Acid-Base Excess 5.9 (H) 0.0 - 2.0 mmol/L   O2 Saturation 98.6 %   Patient temperature 37.0    Collection site REVIEWED BY    Drawn by [id number]    Sample type  ARTERIAL DRAW    Allens test (pass/fail) BRACHIAL ARTERY (A) PASS    Comment: Performed at [facility], [place] [address], [place], [place] [address]  I-STAT, [place] Clap 8     Status: Abnormal   Collection Time: 10/12/20 11:02 AM  Result Value Ref Range   Sodium 135 135 - 145 mmol/L   Potassium 4.7 3.5 - 5.1 mmol/L   Chloride 95 (L) 98 - 111 mmol/L   BUN 65 (H) 8 - 23 mg/dL   Creatinine, Ser 6.90 (H) 0.61 - 1.24 mg/dL   Glucose, Bld 204 (H) 70 - 99 mg/dL    Comment: Glucose reference range applies only to samples taken after fasting for at least  8 hours.   Calcium, Ion 0.97 (L) 1.15 - 1.40 mmol/L   TCO2 34 (H) 22 - 32 mmol/L   Hemoglobin 12.2 (L) 13.0 - 17.0 g/dL   HCT 36.0 (L) 39.0 - 52.0 %  Glucose, capillary     Status: Abnormal   Collection Time: 10/12/20  2:53 PM  Result Value Ref Range   Glucose-Capillary 146 (H) 70 - 99 mg/dL    Comment: Glucose reference range applies only to samples taken after fasting for at least 8 hours.  MYOCARDIAL PERFUSION IMAGING     Status: None   Collection Time: 10/19/20  1:18 PM  Result Value Ref Range   Rest HR 80 bpm   Rest BP 100/64 mmHg   Peak HR 81 bpm   Peak BP 98/62 mmHg   SSS 13    SRS 12    SDS 1    TID 1.01    LV sys vol 270 mL   LV dias vol 310 62 - 150 mL  I-STAT, chem 8     Status: Abnormal   Collection Time: 10/28/20  7:30 AM  Result Value Ref Range   Sodium 134 (L) 135 - 145 mmol/L   Potassium 5.5 (H) 3.5 - 5.1 mmol/L   Chloride 94 (L) 98 - 111 mmol/L   BUN 65 (H) 8 - 23 mg/dL   Creatinine, Ser 7.30 (H) 0.61 - 1.24 mg/dL   Glucose, Bld 173 (H) 70 - 99 mg/dL    Comment: Glucose reference range applies only to samples taken after fasting for at least 8 hours.   Calcium, Ion 0.93 (L) 1.15 - 1.40 mmol/L   TCO2 32 22 - 32 mmol/L   Hemoglobin 11.6 (L) 13.0 - 17.0 g/dL   HCT 34.0 (L) 39.0 - 52.0 %  POCT Activated clotting time     Status: None   Collection Time: 10/28/20  2:10 PM  Result Value Ref Range   Activated  Clotting Time 283 seconds  Type and screen [place]     Status: None   Collection Time: 10/28/20  3:45 PM  Result Value Ref Range   ABO/RH(D) A POS    Antibody Screen NEG    Sample Expiration      10/31/2020,2359 Performed at [facility], [place] [address], [place], [place] [address]   I-STAT 7, (LYTES, BLD GAS, ICA, H+H)     Status: Abnormal   Collection Time: 10/28/20  3:51 PM  Result Value Ref Range   pH, Arterial 7.552 (H) 7.350 - 7.450   pCO2 arterial 36.4 32.0 - 48.0 mmHg   pO2, Arterial 338 (H) 83.0 - 108.0 mmHg   Bicarbonate 32.3 (H) 20.0 - 28.0 mmol/L   TCO2 33 (H) 22 - 32 mmol/L   O2 Saturation 100.0 %   Acid-Base Excess 9.0 (H) 0.0 - 2.0 mmol/L   Sodium 136 135 - 145 mmol/L   Potassium 3.6 3.5 - 5.1 mmol/L   Calcium, Ion 1.05 (L) 1.15 - 1.40 mmol/L   HCT 33.0 (L) 39.0 - 52.0 %   Hemoglobin 11.2 (L) 13.0 - 17.0 g/dL   Patient temperature 36.0 C    Sample type ARTERIAL   Glucose, capillary     Status: Abnormal   Collection Time: 10/28/20  6:00 PM  Result Value Ref Range   Glucose-Capillary 139 (H) 70 - 99 mg/dL    Comment: Glucose reference range applies only to samples taken after fasting for at least 8 hours.  Hemoglobin  A1c     Status: Abnormal   Collection Time: 10/28/20  8:30 PM  Result Value Ref Range   Hgb A1c MFr Bld 9.5 (H) 4.8 - 5.6 %    Comment: (NOTE)         Prediabetes: 5.7 - 6.4         Diabetes: >6.4         Glycemic control for adults with diabetes: <7.0    Mean Plasma Glucose 226 mg/dL    Comment: (NOTE) Performed At: [facility] [place], [place] [id number] Rush Farmer MD [id number]   Iron and TIBC     Status: None   Collection Time: 10/28/20  8:30 PM  Result Value Ref Range   Iron 65 45 - 182 ug/dL   TIBC 330 250 - 450 ug/dL   Saturation Ratios 20 17.9 - 39.5 %   UIBC 265 ug/dL    Comment: Performed at [facility], [place] [address] [place], [place] [address]  Glucose,  capillary     Status: Abnormal   Collection Time: 10/28/20  8:47 PM  Result Value Ref Range   Glucose-Capillary 156 (H) 70 - 99 mg/dL    Comment: Glucose reference range applies only to samples taken after fasting for at least 8 hours.  CBC with Differential/Platelet     Status: Abnormal   Collection Time: 10/29/20  5:30 AM  Result Value Ref Range   WBC 12.3 (H) 4.0 - 10.5 K/uL   RBC 3.67 (L) 4.22 - 5.81 MIL/uL   Hemoglobin 8.7 (L) 13.0 - 17.0 g/dL    Comment: Reticulocyte Hemoglobin testing may be clinically indicated, consider ordering this additional test [id number]    HCT 28.2 (L) 39.0 - 52.0 %   MCV 76.8 (L) 80.0 - 100.0 fL   MCH 23.7 (L) 26.0 - 34.0 pg   MCHC 30.9 30.0 - 36.0 g/dL   RDW 16.1 (H) 11.5 - 15.5 %   Platelets 177 150 - 400 K/uL   nRBC 0.0 0.0 - 0.2 %   Neutrophils Relative % 81 %   Neutro Abs 9.9 (H) 1.7 - 7.7 K/uL   Lymphocytes Relative 11 %   Lymphs Abs 1.3 0.7 - 4.0 K/uL   Monocytes Relative 8 %   Monocytes Absolute 1.0 0.1 - 1.0 K/uL   Eosinophils Relative 0 %   Eosinophils Absolute 0.0 0.0 - 0.5 K/uL   Basophils Relative 0 %   Basophils Absolute 0.1 0.0 - 0.1 K/uL   Immature Granulocytes 0 %   Abs Immature Granulocytes 0.05 0.00 - 0.07 K/uL    Comment: Performed at [facility], [address], [place], [place] [address]  Basic metabolic panel     Status: Abnormal   Collection Time: 10/29/20  5:30 AM  Result Value Ref Range   Sodium 136 135 - 145 mmol/L   Potassium 4.3 3.5 - 5.1 mmol/L   Chloride 95 (L) 98 - 111 mmol/L   CO2 26 22 - 32 mmol/L   Glucose, Bld 222 (H) 70 - 99 mg/dL    Comment: Glucose reference range applies only to samples taken after fasting for at least 8 hours.   BUN 51 (H) 8 - 23 mg/dL   Creatinine, Ser 8.36 (H) 0.61 - 1.24 mg/dL   Calcium 8.5 (L) 8.9 - 10.3 mg/dL   GFR, Estimated 7 (L) >60 mL/min    Comment: (NOTE) Calculated using the CKD-EPI Creatinine Equation (2021)    Anion gap  15 5 - 15    Comment: Performed at  [facility], [place] [address] [place], [place] [address]  Reticulocytes     Status: Abnormal   Collection Time: 10/29/20  5:30 AM  Result Value Ref Range   Retic Ct Pct 1.8 0.4 - 3.1 %   RBC. 3.70 (L) 4.22 - 5.81 MIL/uL   Retic Count, Absolute 65.9 19.0 - 186.0 K/uL   Immature Retic Fract 29.4 (H) 2.3 - 15.9 %    Comment: Performed at [facility] [address] [place], [place] [address]  Glucose, capillary     Status: Abnormal   Collection Time: 10/29/20  5:53 AM  Result Value Ref Range   Glucose-Capillary 186 (H) 70 - 99 mg/dL    Comment: Glucose reference range applies only to samples taken after fasting for at least 8 hours.  Resp Panel by RT-PCR (Flu A&B, Covid) Nasopharyngeal Swab     Status: None   Collection Time: 10/29/20  8:27 AM   Specimen: Nasopharyngeal Swab; Nasopharyngeal(NP) swabs in vial transport medium  Result Value Ref Range   SARS Coronavirus 2 by RT PCR NEGATIVE NEGATIVE    Comment: (NOTE) SARS-CoV-2 target nucleic acids are NOT DETECTED.  The SARS-CoV-2 RNA is generally detectable in upper respiratory specimens during the acute phase of infection. The lowest concentration of SARS-CoV-2 viral copies this assay can detect is 138 copies/mL. A negative result does not preclude SARS-Cov-2 infection and should not be used as the sole basis for treatment or other patient management decisions. A negative result may occur with  improper specimen collection/handling, submission of specimen other than nasopharyngeal swab, presence of viral mutation(s) within the areas targeted by this assay, and inadequate number of viral copies(<138 copies/mL). A negative result must be combined with clinical observations, patient history, and epidemiological information. The expected result is Negative.  Fact Sheet for Patients:  EntrepreneurPulse.com.au  Fact Sheet for Healthcare Providers:  IncredibleEmployment.be  This test is  no t yet approved or cleared by the [place] FDA and  has been authorized for detection and/or diagnosis of SARS-CoV-2 by FDA under an Emergency Use Authorization (EUA). This EUA will remain  in effect (meaning this test can be used) for the duration of the COVID-19 declaration under Section 564(b)(1) of the Act, 21 U.S.C.section 360bbb-3(b)(1), unless the authorization is terminated  or revoked sooner.       Influenza A by PCR NEGATIVE NEGATIVE   Influenza B by PCR NEGATIVE NEGATIVE    Comment: (NOTE) The Xpert Xpress SARS-CoV-2/FLU/RSV plus assay is intended as an aid in the diagnosis of influenza from Nasopharyngeal swab specimens and should not be used as a sole basis for treatment. Nasal washings and aspirates are unacceptable for Xpert Xpress SARS-CoV-2/FLU/RSV testing.  Fact Sheet for Patients: EntrepreneurPulse.com.au  Fact Sheet for Healthcare Providers: IncredibleEmployment.be  This test is not yet approved or cleared by the [place] FDA and has been authorized for detection and/or diagnosis of SARS-CoV-2 by FDA under an Emergency Use Authorization (EUA). This EUA will remain in effect (meaning this test can be used) for the duration of the COVID-19 declaration under Section 564(b)(1) of the Act, 21 U.S.C. section 360bbb-3(b)(1), unless the authorization is terminated or revoked.  Performed at [facility], [place] [address] [place], [place] [address]   CBC     Status: Abnormal   Collection Time: 10/29/20  8:48 AM  Result Value Ref Range   WBC 13.3 (H) 4.0 -  10.5 K/uL   RBC 3.84 (L) 4.22 - 5.81 MIL/uL   Hemoglobin 9.1 (L) 13.0 - 17.0 g/dL   HCT 30.1 (L) 39.0 - 52.0 %   MCV 78.4 (L) 80.0 - 100.0 fL   MCH 23.7 (L) 26.0 - 34.0 pg   MCHC 30.2 30.0 - 36.0 g/dL   RDW 16.6 (H) 11.5 - 15.5 %   Platelets 173 150 - 400 K/uL   nRBC 0.0 0.0 - 0.2 %    Comment: Performed at [facility] [address],  [place], [place] [address]  Hemoglobin A1c     Status: Abnormal   Collection Time: 10/29/20  8:48 AM  Result Value Ref Range   Hgb A1c MFr Bld 9.4 (H) 4.8 - 5.6 %    Comment: (NOTE) Pre diabetes:          5.7%-6.4%  Diabetes:              >6.4%  Glycemic control for   <7.0% adults with diabetes    Mean Plasma Glucose 223.08 mg/dL    Comment: Performed at [facility] [address] [place], [place] [address]  Glucose, capillary     Status: Abnormal   Collection Time: 10/29/20 11:53 AM  Result Value Ref Range   Glucose-Capillary 264 (H) 70 - 99 mg/dL    Comment: Glucose reference range applies only to samples taken after fasting for at least 8 hours.  Hepatitis B surface antigen     Status: None   Collection Time: 10/29/20  2:11 PM  Result Value Ref Range   Hepatitis B Surface Ag NON REACTIVE NON REACTIVE    Comment: Performed at [facility], [address], [place], [place] [address]  Hepatitis B surface antibody,qualitative     Status: Abnormal   Collection Time: 10/29/20  2:11 PM  Result Value Ref Range   Hep B S Ab Reactive (A) NON REACTIVE    Comment: (NOTE) Consistent with immunity, greater than 9.9 mIU/mL.  Performed at [facility], [place] [address], [place], [place] [address]   Hepatitis B core antibody, total     Status: None   Collection Time: 10/29/20  2:11 PM  Result Value Ref Range   Hep B Core Total Ab NON REACTIVE NON REACTIVE    Comment: Performed at [facility] [address], [place], [place] [address]  Glucose, capillary     Status: Abnormal   Collection Time: 10/29/20  6:46 PM  Result Value Ref Range   Glucose-Capillary 199 (H) 70 - 99 mg/dL    Comment: Glucose reference range applies only to samples taken after fasting for at least 8 hours.   Comment 1 Notify RN    Comment 2 Document in Chart   Glucose, capillary     Status: Abnormal   Collection Time: 10/29/20  9:18 PM  Result Value Ref Range   Glucose-Capillary 251 (H) 70 -  99 mg/dL    Comment: Glucose reference range applies only to samples taken after fasting for at least 8 hours.  Glucose, capillary     Status: Abnormal   Collection Time: 10/30/20  6:19 AM  Result Value Ref Range   Glucose-Capillary 220 (H) 70 - 99 mg/dL    Comment: Glucose reference range applies only to samples taken after fasting for at least 8 hours.  Glucose, capillary     Status: Abnormal   Collection Time: 10/30/20 12:37 PM  Result Value Ref  Range   Glucose-Capillary 230 (H) 70 - 99 mg/dL    Comment: Glucose reference range applies only to samples taken after fasting for at least 8 hours.  Glucose, capillary     Status: Abnormal   Collection Time: 10/30/20  5:11 PM  Result Value Ref Range   Glucose-Capillary 183 (H) 70 - 99 mg/dL    Comment: Glucose reference range applies only to samples taken after fasting for at least 8 hours.  Resp Panel by RT-PCR (Flu A&B, Covid) Nasopharyngeal Swab     Status: None   Collection Time: 11/14/2020 11:22 AM   Specimen: Nasopharyngeal Swab; Nasopharyngeal(NP) swabs in vial transport medium  Result Value Ref Range   SARS Coronavirus 2 by RT PCR NEGATIVE NEGATIVE    Comment: (NOTE) SARS-CoV-2 target nucleic acids are NOT DETECTED.  The SARS-CoV-2 RNA is generally detectable in upper respiratory specimens during the acute phase of infection. The lowest concentration of SARS-CoV-2 viral copies this assay can detect is 138 copies/mL. A negative result does not preclude SARS-Cov-2 infection and should not be used as the sole basis for treatment or other patient management decisions. A negative result may occur with  improper specimen collection/handling, submission of specimen other than nasopharyngeal swab, presence of viral mutation(s) within the areas targeted by this assay, and inadequate number of viral copies(<138 copies/mL). A negative result must be combined with clinical observations, patient history, and epidemiological information. The  expected result is Negative.  Fact Sheet for Patients:  EntrepreneurPulse.com.au  Fact Sheet for Healthcare Providers:  IncredibleEmployment.be  This test is no t yet approved or cleared by the [place] FDA and  has been authorized for detection and/or diagnosis of SARS-CoV-2 by FDA under an Emergency Use Authorization (EUA). This EUA will remain  in effect (meaning this test can be used) for the duration of the COVID-19 declaration under Section 564(b)(1) of the Act, 21 U.S.C.section 360bbb-3(b)(1), unless the authorization is terminated  or revoked sooner.       Influenza A by PCR NEGATIVE NEGATIVE   Influenza B by PCR NEGATIVE NEGATIVE    Comment: (NOTE) The Xpert Xpress SARS-CoV-2/FLU/RSV plus assay is intended as an aid in the diagnosis of influenza from Nasopharyngeal swab specimens and should not be used as a sole basis for treatment. Nasal washings and aspirates are unacceptable for Xpert Xpress SARS-CoV-2/FLU/RSV testing.  Fact Sheet for Patients: EntrepreneurPulse.com.au  Fact Sheet for Healthcare Providers: IncredibleEmployment.be  This test is not yet approved or cleared by the [place] FDA and has been authorized for detection and/or diagnosis of SARS-CoV-2 by FDA under an Emergency Use Authorization (EUA). This EUA will remain in effect (meaning this test can be used) for the duration of the COVID-19 declaration under Section 564(b)(1) of the Act, 21 U.S.C. section 360bbb-3(b)(1), unless the authorization is terminated or revoked.  Performed at [facility], [place] [address] [place], [place] [address]   Basic metabolic panel     Status: Abnormal   Collection Time: 10/31/2020 11:30 AM  Result Value Ref Range   Sodium 134 (L) 135 - 145 mmol/L   Potassium 3.6 3.5 - 5.1 mmol/L   Chloride 92 (L) 98 - 111 mmol/L   CO2 26 22 - 32 mmol/L   Glucose, Bld 135 (H) 70 - 99 mg/dL     Comment: Glucose reference range applies only to samples taken after fasting for at least 8 hours.   BUN 16 8 - 23 mg/dL   Creatinine, Ser 3.28 (H)  0.61 - 1.24 mg/dL   Calcium 8.1 (L) 8.9 - 10.3 mg/dL   GFR, Estimated 20 (L) >60 mL/min    Comment: (NOTE) Calculated using the CKD-EPI Creatinine Equation (2021)    Anion gap 16 (H) 5 - 15    Comment: Performed at [facility] [address], [place], [place] [address]  CBC with Differential     Status: Abnormal   Collection Time: 10/30/2020 11:30 AM  Result Value Ref Range   WBC 11.5 (H) 4.0 - 10.5 K/uL   RBC 3.53 (L) 4.22 - 5.81 MIL/uL   Hemoglobin 8.5 (L) 13.0 - 17.0 g/dL    Comment: Reticulocyte Hemoglobin testing may be clinically indicated, consider ordering this additional test [id number]    HCT 26.7 (L) 39.0 - 52.0 %   MCV 75.6 (L) 80.0 - 100.0 fL   MCH 24.1 (L) 26.0 - 34.0 pg   MCHC 31.8 30.0 - 36.0 g/dL   RDW 17.7 (H) 11.5 - 15.5 %   Platelets 181 150 - 400 K/uL   nRBC 0.3 (H) 0.0 - 0.2 %   Neutrophils Relative % 76 %   Neutro Abs 8.9 (H) 1.7 - 7.7 K/uL   Lymphocytes Relative 14 %   Lymphs Abs 1.6 0.7 - 4.0 K/uL   Monocytes Relative 9 %   Monocytes Absolute 1.0 0.1 - 1.0 K/uL   Eosinophils Relative 0 %   Eosinophils Absolute 0.0 0.0 - 0.5 K/uL   Basophils Relative 0 %   Basophils Absolute 0.0 0.0 - 0.1 K/uL   Immature Granulocytes 1 %   Abs Immature Granulocytes 0.06 0.00 - 0.07 K/uL    Comment: Performed at [facility], [place] [address], [place], [place] [address]  Protime-INR     Status: Abnormal   Collection Time: 11/25/2020 11:30 AM  Result Value Ref Range   Prothrombin Time 18.8 (H) 11.4 - 15.2 seconds   INR 1.6 (H) 0.8 - 1.2    Comment: (NOTE) INR goal varies based on device and disease states. Performed at [facility], [place] [address] [place], [place] [address]   APTT     Status: Abnormal   Collection Time: 11/07/2020 11:54 AM  Result Value Ref Range   aPTT 39 (H) 24 - 36 seconds     Comment:        IF BASELINE aPTT IS ELEVATED, SUGGEST PATIENT RISK ASSESSMENT BE USED TO DETERMINE APPROPRIATE ANTICOAGULANT THERAPY. Performed at [facility], [place] [address] [place], [place] [address]   Glucose, capillary     Status: Abnormal   Collection Time: 11/22/2020  2:39 PM  Result Value Ref Range   Glucose-Capillary 111 (H) 70 - 99 mg/dL    Comment: Glucose reference range applies only to samples taken after fasting for at least 8 hours.  I-STAT 7, (LYTES, BLD GAS, ICA, H+H)     Status: Abnormal   Collection Time: 11/10/2020  3:37 PM  Result Value Ref Range   pH, Arterial 7.413 7.350 - 7.450   pCO2 arterial 33.4 32.0 - 48.0 mmHg   pO2, Arterial 378 (H) 83.0 - 108.0 mmHg   Bicarbonate 21.6 20.0 - 28.0 mmol/L   TCO2 23 22 - 32 mmol/L   O2 Saturation 100.0 %   Acid-base deficit 3.0 (H) 0.0 - 2.0 mmol/L   Sodium 134 (L) 135 - 145 mmol/L   Potassium 4.7 3.5 - 5.1 mmol/L   Calcium, Ion 0.93 (L) 1.15 - 1.40 mmol/L  HCT 27.0 (L) 39.0 - 52.0 %   Hemoglobin 9.2 (L) 13.0 - 17.0 g/dL   Patient temperature 35.7 C    Sample type ARTERIAL   Glucose, capillary     Status: None   Collection Time: 11/19/2020  4:36 PM  Result Value Ref Range   Glucose-Capillary 77 70 - 99 mg/dL    Comment: Glucose reference range applies only to samples taken after fasting for at least 8 hours.  I-STAT 7, (LYTES, BLD GAS, ICA, H+H)     Status: Abnormal   Collection Time: 11/16/2020  4:38 PM  Result Value Ref Range   pH, Arterial 7.410 7.350 - 7.450   pCO2 arterial 27.5 (L) 32.0 - 48.0 mmHg   pO2, Arterial 188 (H) 83.0 - 108.0 mmHg   Bicarbonate 17.6 (L) 20.0 - 28.0 mmol/L   TCO2 19 (L) 22 - 32 mmol/L   O2 Saturation 100.0 %   Acid-base deficit 6.0 (H) 0.0 - 2.0 mmol/L   Sodium 134 (L) 135 - 145 mmol/L   Potassium 4.1 3.5 - 5.1 mmol/L   Calcium, Ion 0.91 (L) 1.15 - 1.40 mmol/L   HCT 29.0 (L) 39.0 - 52.0 %   Hemoglobin 9.9 (L) 13.0 - 17.0 g/dL   Patient temperature 36.0 C    Sample type  ARTERIAL   Troponin I (High Sensitivity)     Status: Abnormal   Collection Time: 10/29/2020  5:00 PM  Result Value Ref Range   Troponin I (High Sensitivity) 590 (HH) <18 ng/L    Comment: CRITICAL RESULT CALLED TO, READ BACK BY AND VERIFIED WITH: DR Russella Dar [id number] 10/30/2020 WBOND (NOTE) Elevated high sensitivity troponin I (hsTnI) values and significant  changes across serial measurements may suggest ACS but many other  chronic and acute conditions are known to elevate hsTnI results.  Refer to the Links section for chest pain algorithms and additional  guidance. Performed at [facility], [place] [address] [place], [place] [address]   Basic metabolic panel     Status: Abnormal   Collection Time: 11/19/2020  5:00 PM  Result Value Ref Range   Sodium 134 (L) 135 - 145 mmol/L   Potassium 4.3 3.5 - 5.1 mmol/L   Chloride 96 (L) 98 - 111 mmol/L   CO2 13 (L) 22 - 32 mmol/L   Glucose, Bld 54 (L) 70 - 99 mg/dL    Comment: Glucose reference range applies only to samples taken after fasting for at least 8 hours.   BUN 21 8 - 23 mg/dL   Creatinine, Ser 3.91 (H) 0.61 - 1.24 mg/dL   Calcium 8.0 (L) 8.9 - 10.3 mg/dL   GFR, Estimated 16 (L) >60 mL/min    Comment: (NOTE) Calculated using the CKD-EPI Creatinine Equation (2021)    Anion gap 25 (H) 5 - 15    Comment: REPEATED TO VERIFY Performed at [facility] [address] [place], [place] [address]   MRSA Next Gen by PCR, Nasal     Status: None   Collection Time: 11/16/2020  6:54 PM   Specimen: Nasal Mucosa; Nasal Swab  Result Value Ref Range   MRSA by PCR Next Gen NOT DETECTED NOT DETECTED    Comment: (NOTE) The GeneXpert MRSA Assay (FDA approved for NASAL specimens only), is one component of a comprehensive MRSA colonization surveillance program. It is not intended to diagnose MRSA infection nor to guide or monitor treatment for MRSA infections. Test performance is not FDA approved in patients less than  70 years old. Performed at  [facility], [place] [address] [place], [place] [address]   ECHOCARDIOGRAM COMPLETE     Status: None   Collection Time: 11/02/2020  8:10 PM  Result Value Ref Range   Weight 2,102.31 oz   Height 66 in   BP 122/81 mmHg   S' Lateral 4.90 cm   AR max vel 1.60 cm2   AV Area VTI 1.47 cm2   AV Mean grad 3.0 mmHg   AV Peak grad 6.5 mmHg   Ao pk vel 1.27 m/s   Radius 0.50 cm   MV M vel 4.37 m/s   AV Area mean vel 1.57 cm2   MV Peak grad 76.4 mmHg  Glucose, capillary     Status: Abnormal   Collection Time: 11/28/2020  8:12 PM  Result Value Ref Range   Glucose-Capillary 23 (LL) 70 - 99 mg/dL    Comment: Glucose reference range applies only to samples taken after fasting for at least 8 hours.   Comment 1 Notify RN   Heparin level (unfractionated)     Status: Abnormal   Collection Time: 10/31/2020  8:14 PM  Result Value Ref Range   Heparin Unfractionated 0.16 (L) 0.30 - 0.70 IU/mL    Comment: (NOTE) The clinical reportable range upper limit is being lowered to >1.10 to align with the FDA approved guidance for the current laboratory assay.  If heparin results are below expected values, and patient dosage has  been confirmed, suggest follow up testing of antithrombin III levels. Performed at [facility], [place] [address], [place], [place] [address]   Lactic acid, plasma     Status: Abnormal   Collection Time: 11/28/2020  8:14 PM  Result Value Ref Range   Lactic Acid, Venous >11.0 (HH) 0.5 - 1.9 mmol/L    Comment: CRITICAL RESULT CALLED TO, READ BACK BY AND VERIFIED WITH: Shela Commons [id number] 11/05/2020 WBOND Performed at [facility], [place] [address] [place], [place] [address]   Troponin I (High Sensitivity)     Status: Abnormal   Collection Time: 11/23/2020  8:14 PM  Result Value Ref Range   Troponin I (High Sensitivity) 648 (HH) <18 ng/L    Comment: CRITICAL VALUE NOTED.  VALUE IS CONSISTENT WITH PREVIOUSLY REPORTED AND CALLED VALUE. (NOTE) Elevated high sensitivity  troponin I (hsTnI) values and significant  changes across serial measurements may suggest ACS but many other  chronic and acute conditions are known to elevate hsTnI results.  Refer to the Links section for chest pain algorithms and additional  guidance. Performed at [facility], [place] [address], [place], [place] [address]   Glucose, capillary     Status: Abnormal   Collection Time: 11/11/2020  9:21 PM  Result Value Ref Range   Glucose-Capillary 38 (LL) 70 - 99 mg/dL    Comment: Glucose reference range applies only to samples taken after fasting for at least 8 hours.   Comment 1 Notify RN   Glucose, capillary     Status: Abnormal   Collection Time: 11/21/2020  9:59 PM  Result Value Ref Range   Glucose-Capillary 171 (H) 70 - 99 mg/dL    Comment: Glucose reference range applies only to samples taken after fasting for at least 8 hours.  CBC     Status: Abnormal   Collection Time: 11/02/20 12:07 AM  Result Value Ref Range   WBC 15.7 (H) 4.0 - 10.5 K/uL   RBC 3.25 (L) 4.22 -  5.81 MIL/uL   Hemoglobin 7.8 (L) 13.0 - 17.0 g/dL    Comment: Reticulocyte Hemoglobin testing may be clinically indicated, consider ordering this additional test [id number]    HCT 25.3 (L) 39.0 - 52.0 %   MCV 77.8 (L) 80.0 - 100.0 fL   MCH 24.0 (L) 26.0 - 34.0 pg   MCHC 30.8 30.0 - 36.0 g/dL   RDW 17.9 (H) 11.5 - 15.5 %   Platelets 173 150 - 400 K/uL   nRBC 0.3 (H) 0.0 - 0.2 %    Comment: Performed at [facility], [place] [address] [place], [place] [address]  Culture, blood (routine x 2)     Status: None (Preliminary result)   Collection Time: 11/02/20 12:07 AM   Specimen: BLOOD  Result Value Ref Range   Specimen Description BLOOD SITE NOT SPECIFIED    Special Requests      BOTTLES DRAWN AEROBIC AND ANAEROBIC Blood Culture adequate volume   Culture      NO GROWTH 1 DAY Performed at [facility], [place] [address], [place], [place] [address]    Report Status PENDING   Culture, blood (routine x  2)     Status: None (Preliminary result)   Collection Time: 11/02/20 12:07 AM   Specimen: BLOOD  Result Value Ref Range   Specimen Description BLOOD SITE NOT SPECIFIED    Special Requests      BOTTLES DRAWN AEROBIC AND ANAEROBIC Blood Culture results may not be optimal due to an inadequate volume of blood received in culture bottles   Culture      NO GROWTH 1 DAY Performed at [facility], [place] [address] [place], [place] [address]    Report Status PENDING   Lactic acid, plasma     Status: Abnormal   Collection Time: 11/02/20 12:07 AM  Result Value Ref Range   Lactic Acid, Venous 10.0 (HH) 0.5 - 1.9 mmol/L    Comment: CRITICAL VALUE NOTED.  VALUE IS CONSISTENT WITH PREVIOUSLY REPORTED AND CALLED VALUE. Performed at [facility], [place] [address] [place], [place] [address]   Comprehensive metabolic panel     Status: Abnormal   Collection Time: 11/02/20 12:07 AM  Result Value Ref Range   Sodium 135 135 - 145 mmol/L   Potassium 4.0 3.5 - 5.1 mmol/L   Chloride 93 (L) 98 - 111 mmol/L   CO2 19 (L) 22 - 32 mmol/L   Glucose, Bld 170 (H) 70 - 99 mg/dL    Comment: Glucose reference range applies only to samples taken after fasting for at least 8 hours.   BUN 25 (H) 8 - 23 mg/dL   Creatinine, Ser 4.31 (H) 0.61 - 1.24 mg/dL   Calcium 8.2 (L) 8.9 - 10.3 mg/dL   Total Protein 5.7 (L) 6.5 - 8.1 g/dL   Albumin 2.8 (L) 3.5 - 5.0 g/dL   AST 3,226 (H) 15 - 41 U/L    Comment: RESULTS CONFIRMED BY MANUAL DILUTION   ALT 1,193 (H) 0 - 44 U/L   Alkaline Phosphatase 540 (H) 38 - 126 U/L   Total Bilirubin 3.4 (H) 0.3 - 1.2 mg/dL   GFR, Estimated 15 (L) >60 mL/min    Comment: (NOTE) Calculated using the CKD-EPI Creatinine Equation (2021)    Anion gap 23 (H) 5 - 15    Comment: REPEATED TO VERIFY Performed at [facility], [place] [address], [place],  [address]   Protime-INR  Status: Abnormal   Collection Time: 11/02/20 12:07 AM  Result Value Ref Range   Prothrombin Time  26.2 (H) 11.4 - 15.2 seconds   INR 2.4 (H) 0.8 - 1.2    Comment: (NOTE) INR goal varies based on device and disease states. Performed at [facility], [place] [address], [place], [place] [address]   Magnesium     Status: None   Collection Time: 11/02/20 12:07 AM  Result Value Ref Range   Magnesium 2.2 1.7 - 2.4 mg/dL    Comment: Performed at [facility] [address], [place], [place] [address]  Phosphorus     Status: Abnormal   Collection Time: 11/02/20 12:07 AM  Result Value Ref Range   Phosphorus 7.2 (H) 2.5 - 4.6 mg/dL    Comment: Performed at [facility] [address], [place], [place] [address]  Heparin level (unfractionated)     Status: Abnormal   Collection Time: 11/02/20 12:07 AM  Result Value Ref Range   Heparin Unfractionated 0.12 (L) 0.30 - 0.70 IU/mL    Comment: (NOTE) The clinical reportable range upper limit is being lowered to >1.10 to align with the FDA approved guidance for the current laboratory assay.  If heparin results are below expected values, and patient dosage has  been confirmed, suggest follow up testing of antithrombin III levels. Performed at [facility], [place] [address] [place], [place] [address]   Troponin I (High Sensitivity)     Status: Abnormal   Collection Time: 11/02/20 12:07 AM  Result Value Ref Range   Troponin I (High Sensitivity) 793 (HH) <18 ng/L    Comment: CRITICAL VALUE NOTED.  VALUE IS CONSISTENT WITH PREVIOUSLY REPORTED AND CALLED VALUE. (NOTE) Elevated high sensitivity troponin I (hsTnI) values and significant  changes across serial measurements may suggest ACS but many other  chronic and acute conditions are known to elevate hsTnI results.  Refer to the Links section for chest pain algorithms and additional  guidance. Performed at [facility], [place] [address] [place], [place] [address]   Glucose, capillary     Status: Abnormal   Collection Time: 11/02/20  3:15 AM  Result Value Ref Range    Glucose-Capillary 136 (H) 70 - 99 mg/dL    Comment: Glucose reference range applies only to samples taken after fasting for at least 8 hours.  Heparin level (unfractionated)     Status: Abnormal   Collection Time: 11/02/20  5:40 AM  Result Value Ref Range   Heparin Unfractionated 0.13 (L) 0.30 - 0.70 IU/mL    Comment: (NOTE) The clinical reportable range upper limit is being lowered to >1.10 to align with the FDA approved guidance for the current laboratory assay.  If heparin results are below expected values, and patient dosage has  been confirmed, suggest follow up testing of antithrombin III levels. Performed at [facility], [place] [address], [place], [place] [address]   Troponin I (High Sensitivity)     Status: Abnormal   Collection Time: 11/02/20  5:40 AM  Result Value Ref Range   Troponin I (High Sensitivity) 1,560 (HH) <18 ng/L    Comment: CRITICAL VALUE NOTED.  VALUE IS CONSISTENT WITH PREVIOUSLY REPORTED AND CALLED VALUE. (NOTE) Elevated high sensitivity troponin I (hsTnI) values and significant  changes across serial measurements may suggest ACS but many other  chronic and acute conditions are known to elevate hsTnI results.  Refer to the Links section for chest pain  algorithms and additional  guidance. Performed at [facility], [place] [address], [place], [place] [address]   Lactic acid, plasma     Status: Abnormal   Collection Time: 11/02/20  6:29 AM  Result Value Ref Range   Lactic Acid, Venous 8.3 (HH) 0.5 - 1.9 mmol/L    Comment: CRITICAL VALUE NOTED.  VALUE IS CONSISTENT WITH PREVIOUSLY REPORTED AND CALLED VALUE. Performed at [facility], [place] [address], [place], [place] [address]   Glucose, capillary     Status: Abnormal   Collection Time: 11/02/20  7:55 AM  Result Value Ref Range   Glucose-Capillary 119 (H) 70 - 99 mg/dL    Comment: Glucose reference range applies only to samples taken after fasting for at least 8 hours.  Glucose,  capillary     Status: Abnormal   Collection Time: 11/02/20 12:13 PM  Result Value Ref Range   Glucose-Capillary 185 (H) 70 - 99 mg/dL    Comment: Glucose reference range applies only to samples taken after fasting for at least 8 hours.  Heparin level (unfractionated)     Status: Abnormal   Collection Time: 11/02/20  3:13 PM  Result Value Ref Range   Heparin Unfractionated 0.16 (L) 0.30 - 0.70 IU/mL    Comment: (NOTE) The clinical reportable range upper limit is being lowered to >1.10 to align with the FDA approved guidance for the current laboratory assay.  If heparin results are below expected values, and patient dosage has  been confirmed, suggest follow up testing of antithrombin III levels. Performed at [facility], [place] [address] [place], [place] [address]   Glucose, capillary     Status: Abnormal   Collection Time: 11/02/20  3:23 PM  Result Value Ref Range   Glucose-Capillary 170 (H) 70 - 99 mg/dL    Comment: Glucose reference range applies only to samples taken after fasting for at least 8 hours.  Glucose, capillary     Status: Abnormal   Collection Time: 11/02/20  5:42 PM  Result Value Ref Range   Glucose-Capillary 175 (H) 70 - 99 mg/dL    Comment: Glucose reference range applies only to samples taken after fasting for at least 8 hours.  Glucose, capillary     Status: Abnormal   Collection Time: 11/02/20  8:57 PM  Result Value Ref Range   Glucose-Capillary 171 (H) 70 - 99 mg/dL    Comment: Glucose reference range applies only to samples taken after fasting for at least 8 hours.  Glucose, capillary     Status: Abnormal   Collection Time: 11/02/20 11:34 PM  Result Value Ref Range   Glucose-Capillary 152 (H) 70 - 99 mg/dL    Comment: Glucose reference range applies only to samples taken after fasting for at least 8 hours.  Heparin level (unfractionated)     Status: Abnormal   Collection Time: 2020-12-02  1:16 AM  Result Value Ref Range   Heparin Unfractionated  0.24 (L) 0.30 - 0.70 IU/mL    Comment: (NOTE) The clinical reportable range upper limit is being lowered to >1.10 to align with the FDA approved guidance for the current laboratory assay.  If heparin results are below expected values, and patient dosage has  been confirmed, suggest follow up testing of antithrombin III levels. Performed at [facility], [place] [address] [place], [place] [address]   Heparin level (unfractionated)     Status: Abnormal   Collection Time: Dec 02, 2020  3:11 AM  Result  Value Ref Range   Heparin Unfractionated 0.29 (L) 0.30 - 0.70 IU/mL    Comment: (NOTE) The clinical reportable range upper limit is being lowered to >1.10 to align with the FDA approved guidance for the current laboratory assay.  If heparin results are below expected values, and patient dosage has  been confirmed, suggest follow up testing of antithrombin III levels. Performed at [facility], [place] [address] [place], [place] [address]   Glucose, capillary     Status: Abnormal   Collection Time: Dec 01, 2020  3:16 AM  Result Value Ref Range   Glucose-Capillary 128 (H) 70 - 99 mg/dL    Comment: Glucose reference range applies only to samples taken after fasting for at least 8 hours.  Basic metabolic panel     Status: Abnormal   Collection Time: Dec 01, 2020  3:17 AM  Result Value Ref Range   Sodium 130 (L) 135 - 145 mmol/L   Potassium 4.1 3.5 - 5.1 mmol/L   Chloride 91 (L) 98 - 111 mmol/L   CO2 23 22 - 32 mmol/L   Glucose, Bld 126 (H) 70 - 99 mg/dL    Comment: Glucose reference range applies only to samples taken after fasting for at least 8 hours.   BUN 36 (H) 8 - 23 mg/dL   Creatinine, Ser 5.43 (H) 0.61 - 1.24 mg/dL   Calcium 7.4 (L) 8.9 - 10.3 mg/dL   GFR, Estimated 11 (L) >60 mL/min    Comment: (NOTE) Calculated using the CKD-EPI Creatinine Equation (2021)    Anion gap 16 (H) 5 - 15    Comment: Performed at [facility] [address] [place], [place] [address]  CBC      Status: Abnormal   Collection Time: 12/01/20  3:17 AM  Result Value Ref Range   WBC 14.7 (H) 4.0 - 10.5 K/uL   RBC 3.59 (L) 4.22 - 5.81 MIL/uL   Hemoglobin 8.6 (L) 13.0 - 17.0 g/dL    Comment: Reticulocyte Hemoglobin testing may be clinically indicated, consider ordering this additional test [id number]    HCT 27.8 (L) 39.0 - 52.0 %   MCV 77.4 (L) 80.0 - 100.0 fL   MCH 24.0 (L) 26.0 - 34.0 pg   MCHC 30.9 30.0 - 36.0 g/dL   RDW 18.5 (H) 11.5 - 15.5 %   Platelets 174 150 - 400 K/uL   nRBC 0.5 (H) 0.0 - 0.2 %    Comment: Performed at [facility], [place] [address], [place], [place] [address]  Protime-INR     Status: Abnormal   Collection Time: December 01, 2020  3:17 AM  Result Value Ref Range   Prothrombin Time 30.4 (H) 11.4 - 15.2 seconds   INR 2.9 (H) 0.8 - 1.2    Comment: (NOTE) INR goal varies based on device and disease states. Performed at [facility], [place] [address], [place], [place] [address]   Glucose, capillary     Status: None   Collection Time: 12-01-20  6:59 AM  Result Value Ref Range   Glucose-Capillary 92 70 - 99 mg/dL    Comment: Glucose reference range applies only to samples taken after fasting for at least 8 hours.

## 2020-11-29 NOTE — Progress Notes (Addendum)
  Progress Note    2020/11/10 9:16 AM Hospital Day 2  Subjective:  says his foot is sore  Afebrile HR 90's NSR 937'T-024'O systolic 97-35 HFNC  Vitals:   11-10-20 0705 11/10/2020 0756  BP:    Pulse: 96   Resp: 15   Temp: 97.8 F (36.6 C)   SpO2: (!) 76% 99%    Physical Exam: Cardiac:  regular Lungs:   non labored Extremities:  left foot cool to touch.  Left calf very tender to touch.  Leg is warm to touch to knee.   CBC    Component Value Date/Time   WBC 14.7 (H) November 10, 2020 0317   RBC 3.59 (L) 11/10/2020 0317   HGB 8.6 (L) 11/10/2020 0317   HCT 27.8 (L) 2020/11/10 0317   PLT 174 11-10-20 0317   MCV 77.4 (L) 11-10-20 0317   MCH 24.0 (L) 11-10-20 0317   MCHC 30.9 10-Nov-2020 0317   RDW 18.5 (H) 11-10-20 0317   LYMPHSABS 1.6 11/17/2020 1130   MONOABS 1.0 11/15/2020 1130   EOSABS 0.0 10/31/2020 1130   BASOSABS 0.0 11/22/2020 1130    BMET    Component Value Date/Time   NA 130 (L) 11/10/2020 0317   K 4.1 10-Nov-2020 0317   CL 91 (L) 11-10-20 0317   CO2 23 10-Nov-2020 0317   GLUCOSE 126 (H) 11/10/20 0317   BUN 36 (H) Nov 10, 2020 0317   CREATININE 5.43 (H) 10-Nov-2020 0317   CREATININE 0.86 09/26/2012 1536   CALCIUM 7.4 (L) Nov 10, 2020 0317   GFRNONAA 11 (L) 10-Nov-2020 0317   GFRNONAA >89 09/26/2012 1536   GFRAA 6 (L) 06/12/2018 1056   GFRAA >89 09/26/2012 1536    INR    Component Value Date/Time   INR 2.9 (H) 11/10/20 0317     Intake/Output Summary (Last 24 hours) at 2020/11/10 0916 Last data filed at 2020-11-10 0500 Gross per 24 hour  Intake 867.58 ml  Output --  Net 867.58 ml     Assessment/Plan:  65 y.o. male with ischemia of LLE Hospital Day 2  -Per cardiology, pt with NSTEMI vs demand ischemia with high sensitivity troponin of 1560 in setting of shock and ESRD.  High likelihood of multivessel coronary artery disease, but no previous heart catheterization.  Plan for medical therapy only in this complex patient with multiple acute and  chronic comorbid conditions. They feel he is not a good candidate for cardiac catheterization.  He has severely decreased LV function. -ok from cardiology to discontinue heparin.  Pt's INR today is 2.9.  will d/w Dr. Trula Slade. -still with leukocytosis but slightly improved this am at 14.7 down from 15.7. -pt still requiring Levophed.  Do not feel at this time, his foot is making him sick.  Will continue to follow and evaluate.    Leontine Locket, PA-C [facility] [phone] 11/10/2020 9:16 AM  Addendum:  discussed with Dr. Trula Slade and ok to discontinue heparin (order placed) since LLE is not salvageable and will require an above knee amputation.  Hopeful we can get this done later this week. Will defer to primary team about sq heparin given INR of 2.9.  Leontine Locket, [facility] 11-10-2020 9:28 AM

## 2020-11-29 NOTE — Progress Notes (Signed)
[facility]  Called back emergently to the bedside for VT Received CPR x1 min, had recovery, mental status back to baseline Administered 300mg  IV amiodarone  Required twice the levophed for worsening shock Added vasopressin Mental status declined Required intubation Discussed with vascular surgery, not an operative candidate Wife updated, prognosis poor Palliative care support appreciated, do not anticipate survival  Additional cc time 45 minutes  Roselie Awkward, MD [place] [facility] Pager: [phone]

## 2020-11-29 NOTE — Procedures (Signed)
Extubation Procedure Note  Patient Details:   Name: Gary [name] DOB: 11-22-1955 MRN: [id number]   Airway Documentation:    Vent end date: 11-19-20 Vent end time: 1954   Evaluation  O2 sats: stable throughout and currently acceptable Complications: No apparent complications Patient did tolerate procedure well. Bilateral Breath Sounds: Clear, Diminished   Yes  RT extubated patient per MD order.  Alvera Singh 11/19/2020, 7:58 PM

## 2020-11-29 NOTE — Progress Notes (Signed)
2045- TOD Pronounced by [place] Issaih Kaus, RN and Briscoe Deutscher, RN.  2100- Covering CCM physician notified through [facility]  2115- Pt's wife at bedside. No funeral home decision at this time. Pt's belongings given to wife- including clothes, prosthetic leg, phone. Patient placement card and packet including hand prints given to wife.  2120- [facility] notified of TOD. Spoke to [employer].  2200- Lines pulled, body prepared.   2215- Spoke to patient placement. Pt's body taken to morgue.

## 2020-11-29 NOTE — Procedures (Signed)
Central Venous Catheter Insertion Procedure Note  ASHUTOSH DIEGUEZ  [id number]  22-Oct-1955  Date:2020-11-05  Time:1:08 PM   Provider Performing:Nanette Wirsing D Rollene Rotunda   Procedure: Insertion of Non-tunneled Central Venous Catheter(36556)with US guidance (67014)    Indication(s) Hemodialysis  Consent Risks of the procedure as well as the alternatives and risks of each were explained to the patient and/or caregiver.  Consent for the procedure was obtained and is signed in the bedside chart  Anesthesia Topical only with 1% lidocaine   Timeout Verified patient identification, verified procedure, site/side was marked, verified correct patient position, special equipment/implants available, medications/allergies/relevant history reviewed, required imaging and test results available.  Sterile Technique Maximal sterile technique including full sterile barrier drape, hand hygiene, sterile gown, sterile gloves, mask, hair covering, sterile ultrasound probe cover (if used).  Procedure Description Area of catheter insertion was cleaned with chlorhexidine and draped in sterile fashion.   With real-time ultrasound guidance a HD catheter was placed into the right internal jugular vein.  Nonpulsatile blood flow and easy flushing noted in all ports.  The catheter was sutured in place and sterile dressing applied.  Complications/Tolerance None; patient tolerated the procedure well. Chest X-ray is ordered to verify placement for internal jugular or subclavian cannulation.  Chest x-ray is not ordered for femoral cannulation.  EBL Minimal  Specimen(s) None  JD Rexene Agent [facility] 05-Nov-2020, 1:10 PM  Please see [email or web address] for pager details.  From 7A-7P if no response, please call [phone]. After hours, please call ELink [phone].

## 2020-11-29 NOTE — Progress Notes (Signed)
ANTICOAGULATION CONSULT NOTE - Follow Up Consult  Pharmacy Consult for Heparin Indication: LLE ischemia  Allergies  Allergen Reactions   Oxycodone-Acetaminophen Rash and Other (See Comments)    Macular papular rash after two days on Oxy/APAP resolved with d/c of drug.    Patient Measurements: Height: 5\' 6"  (167.6 cm) Weight: 59.5 kg (131 lb 2.8 oz) IBW/kg (Calculated) : 63.8  Vital Signs: Temp: 97.8 F (36.6 C) (07/05 2347) Temp Source: Oral (07/05 2347) BP: 121/70 (07/06 0200) Pulse Rate: 94 (07/06 0200)  Labs: Recent Labs    11/04/2020 1130 11/14/2020 1154 10/31/2020 1537 11/19/2020 1638 11/23/2020 1700 11/21/2020 1700 11/08/2020 2014 11/02/20 0007 11/02/20 0540 11/02/20 1513 11-22-20 0116  HGB 8.5*  --  9.2* 9.9*  --   --   --  7.8*  --   --   --   HCT 26.7*  --  27.0* 29.0*  --   --   --  25.3*  --   --   --   PLT 181  --   --   --   --   --   --  173  --   --   --   APTT  --  39*  --   --   --   --   --   --   --   --   --   LABPROT 18.8*  --   --   --   --   --   --  26.2*  --   --   --   INR 1.6*  --   --   --   --   --   --  2.4*  --   --   --   HEPARINUNFRC  --   --   --   --   --    < > 0.16* 0.12* 0.13* 0.16* 0.24*  CREATININE 3.28*  --   --   --  3.91*  --   --  4.31*  --   --   --   TROPONINIHS  --   --   --   --  590*   < > 648* 793* 1,560*  --   --    < > = values in this interval not displayed.    Estimated Creatinine Clearance: 14.6 mL/min (A) (by C-G formula based on SCr of 4.31 mg/dL (H)).   Assessment: 65 y.o. male with LLE ischemia, awaiting L AKA, for heparin    Goal of Therapy:  Heparin level 0.3-0.7 units/ml Monitor platelets by anticoagulation protocol: Yes   Plan:  Increase Heparin 1600 units/hr Check heparin level in 8 hours.   Caryl Pina 2020/11/22,2:20 AM

## 2020-11-29 DEATH — deceased

## 2020-12-21 ENCOUNTER — Ambulatory Visit: Payer: Medicare (Managed Care) | Admitting: Internal Medicine

## 2020-12-30 ENCOUNTER — Ambulatory Visit: Payer: Medicare (Managed Care) | Admitting: Internal Medicine

## 2021-01-06 ENCOUNTER — Ambulatory Visit: Payer: Medicare (Managed Care) | Admitting: Internal Medicine

## 2021-01-25 ENCOUNTER — Encounter (INDEPENDENT_AMBULATORY_CARE_PROVIDER_SITE_OTHER): Payer: Medicare (Managed Care) | Admitting: Ophthalmology

## 2021-01-26 ENCOUNTER — Encounter (INDEPENDENT_AMBULATORY_CARE_PROVIDER_SITE_OTHER): Payer: Medicare (Managed Care) | Admitting: Ophthalmology

## 2021-03-22 ENCOUNTER — Ambulatory Visit: Payer: Medicare (Managed Care) | Admitting: Internal Medicine

## 2022-10-21 IMAGING — CT CT ANGIO CHEST
2 of 6 series · 18 of 36 positions shown · IV contrast (omnipaque)
Comparison: None.

CLINICAL DATA: Shortness of breath.

EXAM:
CT ANGIOGRAPHY CHEST WITH CONTRAST
TECHNIQUE: Multidetector CT imaging of the chest was performed using the
standard protocol during bolus administration of intravenous
contrast. Multiplanar CT image reconstructions and MIPs were
obtained to evaluate the vascular anatomy.
CONTRAST:  50mL OMNIPAQUE IOHEXOL 350 MG/ML SOLN

[Series 7: pe thins · axial · 0.81mm/px · z∈[+1156,+1439]mm · 17 of 451 slices shown]
[im 23/451  lung]
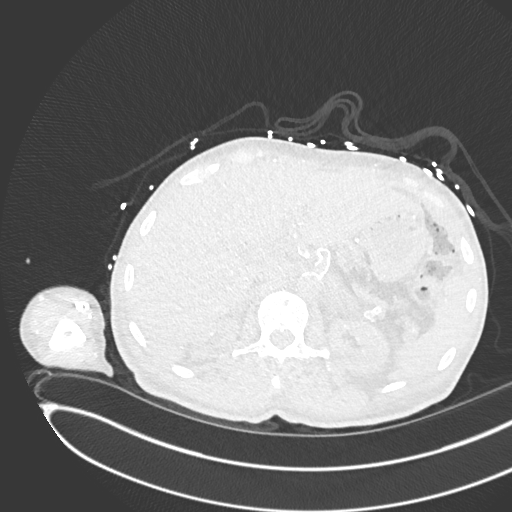
[im 46/451  mediastinal]
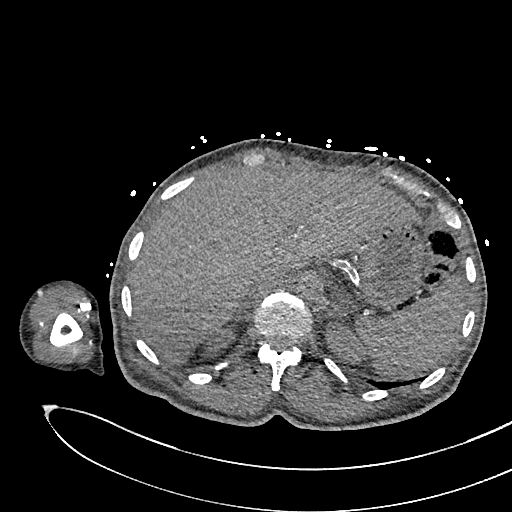
[im 68/451  lung]
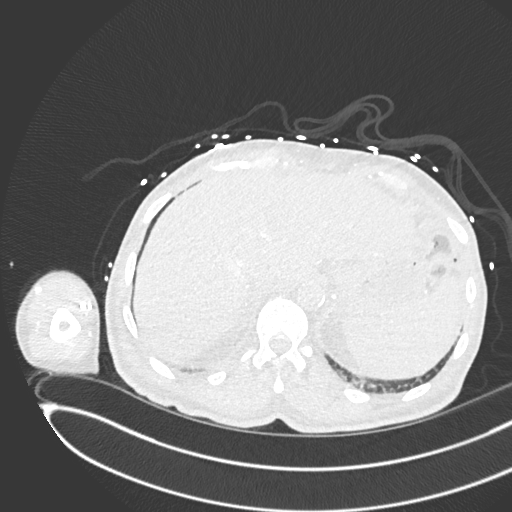
[im 91/451  mediastinal]
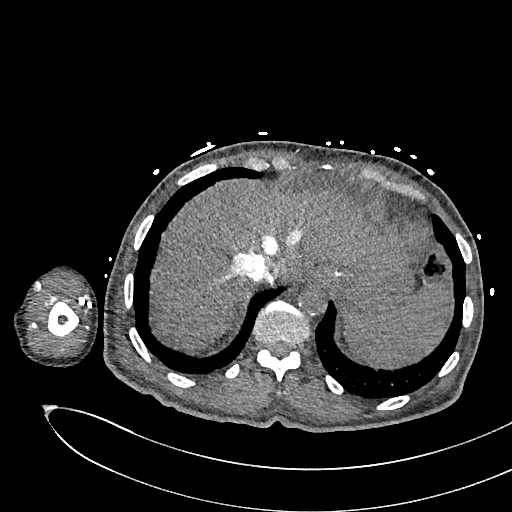
[im 136/451  lung]
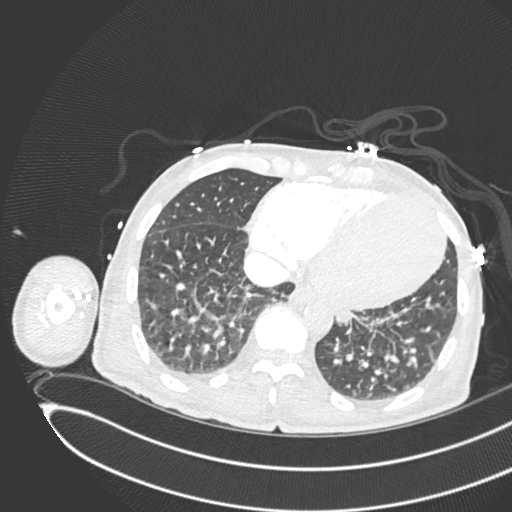
[im 158/451  mediastinal]
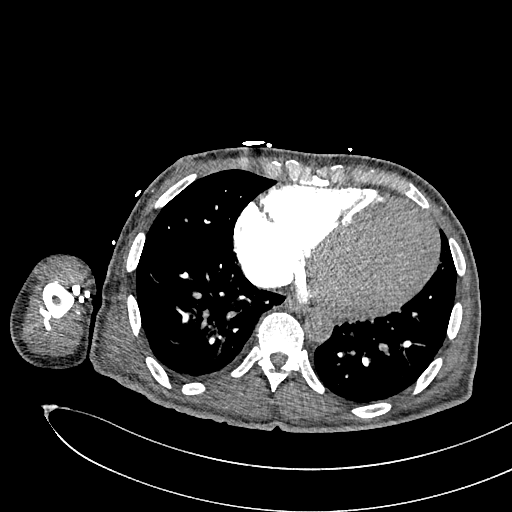
[im 181/451  lung]
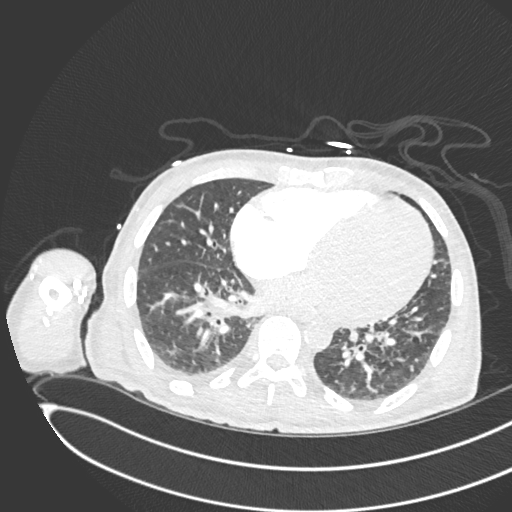
[im 203/451  mediastinal]
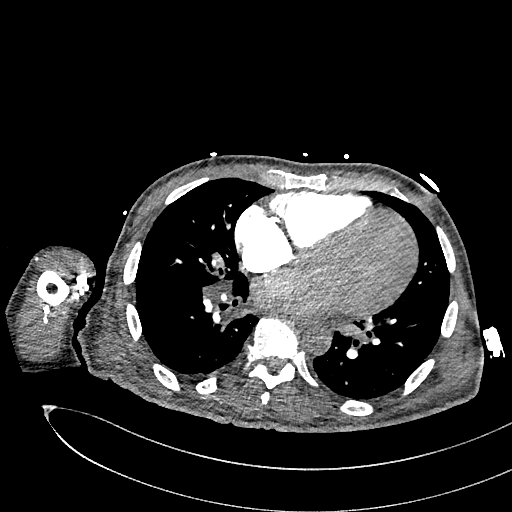
[im 226/451  lung]
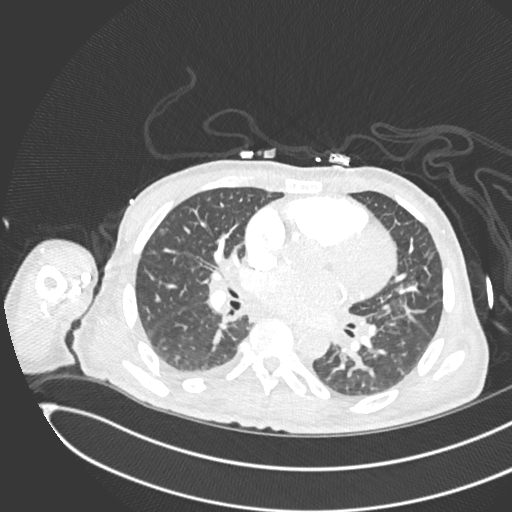
[im 248/451  mediastinal]
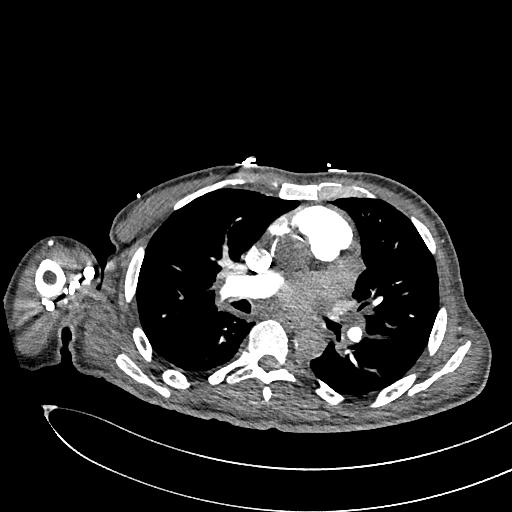
[im 271/451  lung]
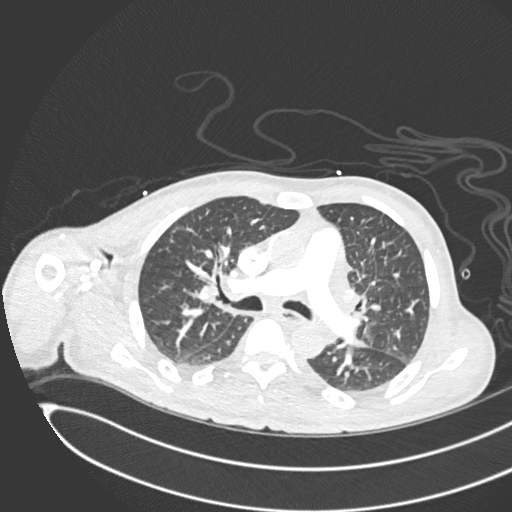
[im 293/451  mediastinal]
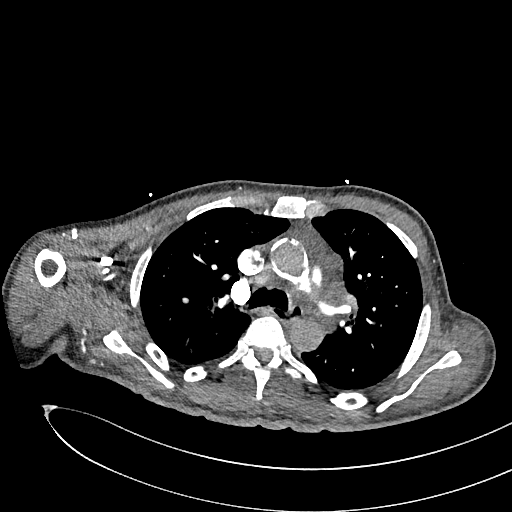
[im 316/451  lung]
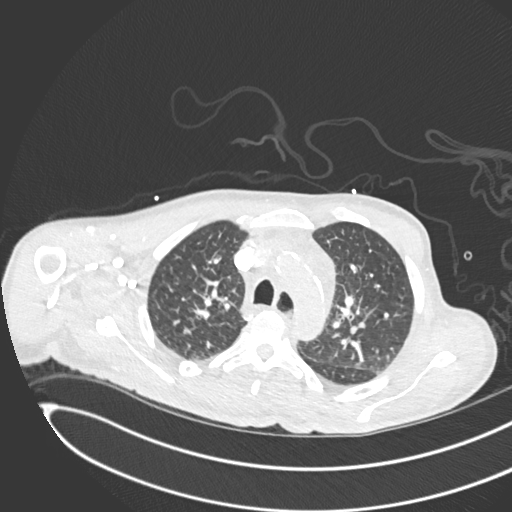
[im 361/451  mediastinal]
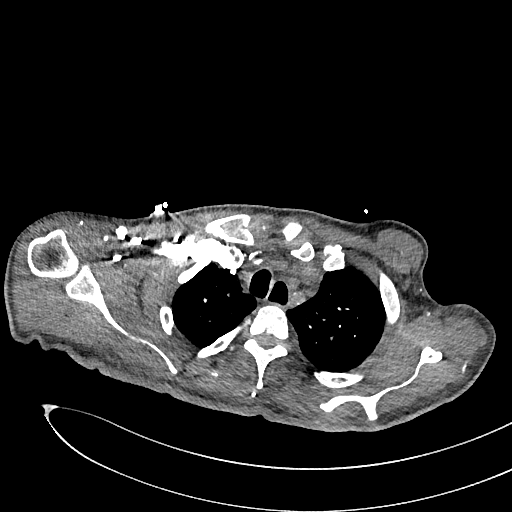
[im 383/451  lung]
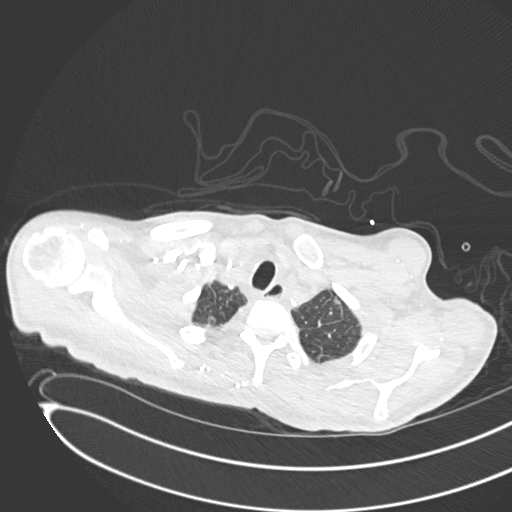
[im 406/451  mediastinal]
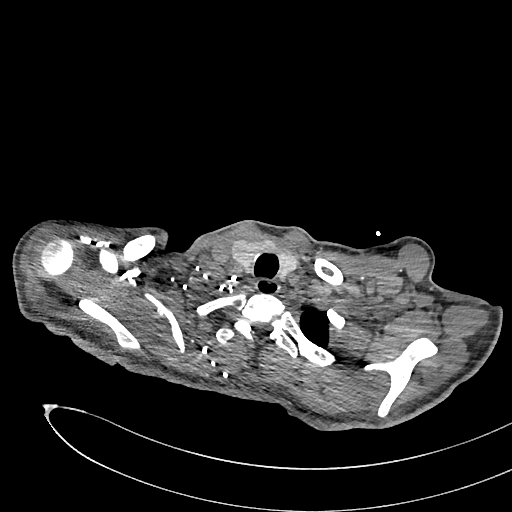
[im 428/451  lung]
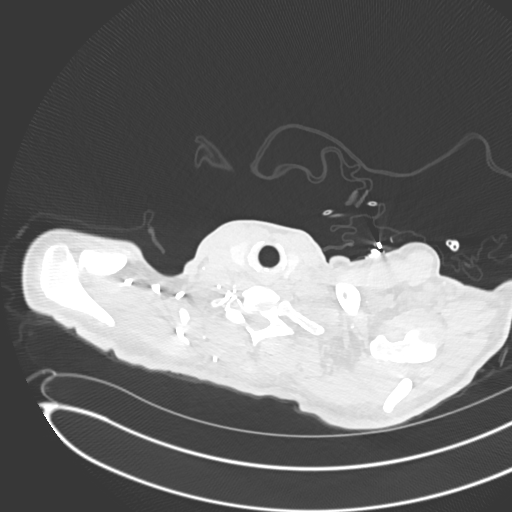

[Series 8: pe 2mm cor · coronal · 0.62mm/px · 1 of 148 slices shown]
[im 74/148  mediastinal]
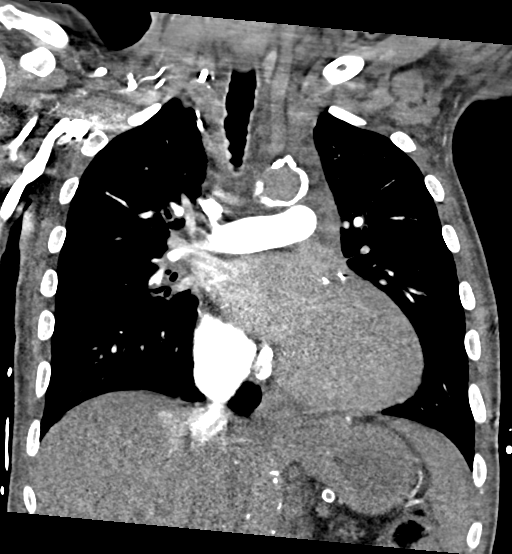

[18 of 36 positions shown; findings below may reference images not displayed]

FINDINGS: Cardiovascular: The heart is enlarged. No pericardial effusion.
Significant age advanced atherosclerotic calcification involving the
thoracic aorta, branch vessels and in particular the coronary
arteries.

There is significant reflux of contrast down the IVC and into the
hepatic veins suggesting tricuspid regurgitation.

The pulmonary arterial tree is well opacified. No filling defects to
suggest pulmonary embolism.

Mediastinum/Nodes: Mediastinal and hilar lymphadenopathy.

The esophagus is grossly normal.

Lungs/Pleura: Moderate peribronchial thickening most notably in the
lower lung zones bilaterally suggesting bronchitis. Patchy mosaic
pattern of ground-glass attenuation in the lungs. This can be seen
with reactive airways disease/asthma respiratory bronchiolitis,
cryptogenic organizing pneumonia or hypersensitivity pneumonitis.
Atypical/viral pneumonia would be another possibility. No focal
airspace consolidation/typical pneumonia. No pleural effusions.

Upper Abdomen: No acute upper abdominal findings. Advanced vascular
calcifications likely related to the patient's diabetes.

Musculoskeletal: No significant bony findings.

Review of the MIP images confirms the above findings.
IMPRESSION: 1. No CT findings for pulmonary embolism.
2. Significant age advanced atherosclerotic calcification involving
the thoracic aorta, branch vessels and in particular the coronary
arteries.
3. Significant reflux of contrast down the IVC and into the hepatic
veins suggesting tricuspid regurgitation.
4. Moderate peribronchial thickening most notably in the lower lung
zones bilaterally suggesting bronchitis.
5. Patchy mosaic pattern of ground-glass attenuation in the lungs.
This can be seen with reactive airways disease/asthma respiratory
bronchiolitis, cryptogenic organizing pneumonia or hypersensitivity
pneumonitis. Atypical/viral pneumonia would be another possibility.
6. Mediastinal and hilar lymphadenopathy. Patient may need further
evaluation with PET-CT.
7. Aortic atherosclerosis.

Aortic Atherosclerosis ([id number]).
